# Patient Record
Sex: Female | Born: 1950
Health system: Southern US, Community
[De-identification: ages and names within clinical notes are randomized; demographics above are authoritative.]

## PROBLEM LIST (undated history)

## (undated) DIAGNOSIS — I2510 Atherosclerotic heart disease of native coronary artery without angina pectoris: Secondary | ICD-10-CM

## (undated) DIAGNOSIS — M797 Fibromyalgia: Secondary | ICD-10-CM

## (undated) DIAGNOSIS — J189 Pneumonia, unspecified organism: Secondary | ICD-10-CM

## (undated) DIAGNOSIS — F329 Major depressive disorder, single episode, unspecified: Secondary | ICD-10-CM

## (undated) DIAGNOSIS — M1990 Unspecified osteoarthritis, unspecified site: Secondary | ICD-10-CM

## (undated) DIAGNOSIS — I10 Essential (primary) hypertension: Secondary | ICD-10-CM

## (undated) DIAGNOSIS — F32A Depression, unspecified: Secondary | ICD-10-CM

## (undated) DIAGNOSIS — E049 Nontoxic goiter, unspecified: Secondary | ICD-10-CM

## (undated) DIAGNOSIS — I219 Acute myocardial infarction, unspecified: Secondary | ICD-10-CM

## (undated) HISTORY — PX: REDUCTION MAMMAPLASTY: SUR839

## (undated) HISTORY — PX: BREAST SURGERY: SHX581

## (undated) HISTORY — PX: CARDIAC CATHETERIZATION: SHX172

## (undated) HISTORY — PX: ABDOMINAL HYSTERECTOMY: SHX81

---

## 2000-12-27 ENCOUNTER — Ambulatory Visit (HOSPITAL_COMMUNITY): Admission: RE | Admit: 2000-12-27 | Discharge: 2000-12-27 | Payer: Self-pay | Admitting: Family Medicine

## 2003-02-18 ENCOUNTER — Emergency Department (HOSPITAL_COMMUNITY): Admission: EM | Admit: 2003-02-18 | Discharge: 2003-02-18 | Payer: Self-pay | Admitting: Emergency Medicine

## 2003-06-22 ENCOUNTER — Ambulatory Visit (HOSPITAL_COMMUNITY): Admission: RE | Admit: 2003-06-22 | Discharge: 2003-06-22 | Payer: Self-pay | Admitting: Family Medicine

## 2003-10-19 ENCOUNTER — Ambulatory Visit: Payer: Self-pay | Admitting: Family Medicine

## 2003-11-17 ENCOUNTER — Ambulatory Visit: Payer: Self-pay | Admitting: *Deleted

## 2004-05-02 ENCOUNTER — Ambulatory Visit: Payer: Self-pay | Admitting: Family Medicine

## 2004-05-24 ENCOUNTER — Ambulatory Visit: Payer: Self-pay | Admitting: Family Medicine

## 2004-10-31 ENCOUNTER — Ambulatory Visit: Payer: Self-pay | Admitting: Family Medicine

## 2004-11-08 ENCOUNTER — Ambulatory Visit (HOSPITAL_COMMUNITY): Admission: RE | Admit: 2004-11-08 | Discharge: 2004-11-08 | Payer: Self-pay | Admitting: Family Medicine

## 2004-12-13 ENCOUNTER — Ambulatory Visit (HOSPITAL_COMMUNITY): Admission: RE | Admit: 2004-12-13 | Discharge: 2004-12-13 | Payer: Self-pay | Admitting: Family Medicine

## 2004-12-13 ENCOUNTER — Emergency Department (HOSPITAL_COMMUNITY): Admission: EM | Admit: 2004-12-13 | Discharge: 2004-12-13 | Payer: Self-pay | Admitting: Emergency Medicine

## 2005-01-10 ENCOUNTER — Ambulatory Visit: Payer: Self-pay | Admitting: Family Medicine

## 2005-01-26 ENCOUNTER — Ambulatory Visit: Payer: Self-pay | Admitting: Family Medicine

## 2005-06-13 ENCOUNTER — Emergency Department (HOSPITAL_COMMUNITY): Admission: EM | Admit: 2005-06-13 | Discharge: 2005-06-13 | Payer: Self-pay | Admitting: Emergency Medicine

## 2005-06-18 ENCOUNTER — Ambulatory Visit: Payer: Self-pay | Admitting: Family Medicine

## 2005-09-28 ENCOUNTER — Emergency Department (HOSPITAL_COMMUNITY): Admission: EM | Admit: 2005-09-28 | Discharge: 2005-09-29 | Payer: Self-pay | Admitting: Emergency Medicine

## 2005-10-09 ENCOUNTER — Ambulatory Visit: Payer: Self-pay | Admitting: Family Medicine

## 2005-12-31 ENCOUNTER — Ambulatory Visit: Payer: Self-pay | Admitting: Family Medicine

## 2006-04-23 ENCOUNTER — Ambulatory Visit: Payer: Self-pay | Admitting: Family Medicine

## 2006-10-02 ENCOUNTER — Ambulatory Visit: Payer: Self-pay | Admitting: Family Medicine

## 2006-10-23 ENCOUNTER — Encounter (INDEPENDENT_AMBULATORY_CARE_PROVIDER_SITE_OTHER): Payer: Self-pay | Admitting: *Deleted

## 2006-11-01 ENCOUNTER — Emergency Department (HOSPITAL_COMMUNITY): Admission: EM | Admit: 2006-11-01 | Discharge: 2006-11-01 | Payer: Self-pay | Admitting: Emergency Medicine

## 2006-11-05 ENCOUNTER — Ambulatory Visit: Payer: Self-pay | Admitting: Family Medicine

## 2006-11-05 LAB — CONVERTED CEMR LAB
ALT: 16 units/L (ref 0–35)
AST: 20 units/L (ref 0–37)
Albumin: 4.2 g/dL (ref 3.5–5.2)
Alkaline Phosphatase: 101 units/L (ref 39–117)
BUN: 19 mg/dL (ref 6–23)
Basophils Absolute: 0 10*3/uL (ref 0.0–0.1)
Basophils Relative: 0 % (ref 0–1)
CO2: 28 meq/L (ref 19–32)
Calcium: 9 mg/dL (ref 8.4–10.5)
Chloride: 104 meq/L (ref 96–112)
Creatinine, Ser: 0.91 mg/dL (ref 0.40–1.20)
Eosinophils Absolute: 0.2 10*3/uL (ref 0.0–0.7)
Eosinophils Relative: 2 % (ref 0–5)
Glucose, Bld: 92 mg/dL (ref 70–99)
HCT: 38.1 % (ref 36.0–46.0)
Hemoglobin: 12.1 g/dL (ref 12.0–15.0)
Lymphocytes Relative: 31 % (ref 12–46)
Lymphs Abs: 2.4 10*3/uL (ref 0.7–3.3)
MCHC: 31.8 g/dL (ref 30.0–36.0)
MCV: 82.8 fL (ref 78.0–100.0)
Monocytes Absolute: 0.7 10*3/uL (ref 0.2–0.7)
Monocytes Relative: 9 % (ref 3–11)
Neutro Abs: 4.4 10*3/uL (ref 1.7–7.7)
Neutrophils Relative %: 58 % (ref 43–77)
Platelets: 265 10*3/uL (ref 150–400)
Potassium: 3.7 meq/L (ref 3.5–5.3)
RBC: 4.6 M/uL (ref 3.87–5.11)
RDW: 16.2 % — ABNORMAL HIGH (ref 11.5–14.0)
Sodium: 144 meq/L (ref 135–145)
Total Bilirubin: 0.4 mg/dL (ref 0.3–1.2)
Total Protein: 6.7 g/dL (ref 6.0–8.3)
WBC: 7.7 10*3/uL (ref 4.0–10.5)

## 2007-02-07 ENCOUNTER — Ambulatory Visit: Payer: Self-pay | Admitting: Family Medicine

## 2007-02-27 ENCOUNTER — Ambulatory Visit: Payer: Self-pay | Admitting: Family Medicine

## 2007-09-05 ENCOUNTER — Ambulatory Visit: Payer: Self-pay | Admitting: Family Medicine

## 2007-09-05 LAB — CONVERTED CEMR LAB
ALT: 20 units/L (ref 0–35)
AST: 26 units/L (ref 0–37)
Albumin: 4.6 g/dL (ref 3.5–5.2)
Alkaline Phosphatase: 108 units/L (ref 39–117)
BUN: 15 mg/dL (ref 6–23)
CO2: 25 meq/L (ref 19–32)
Calcium: 9.3 mg/dL (ref 8.4–10.5)
Chloride: 101 meq/L (ref 96–112)
Cholesterol: 186 mg/dL (ref 0–200)
Creatinine, Ser: 1.07 mg/dL (ref 0.40–1.20)
Glucose, Bld: 133 mg/dL — ABNORMAL HIGH (ref 70–99)
HDL: 57 mg/dL (ref >39)
LDL Cholesterol: 113 mg/dL — ABNORMAL HIGH (ref 0–99)
Potassium: 3.5 meq/L (ref 3.5–5.3)
Sodium: 139 meq/L (ref 135–145)
TSH: 1.842 microintl units/mL (ref 0.350–4.50)
Total Bilirubin: 0.4 mg/dL (ref 0.3–1.2)
Total CHOL/HDL Ratio: 3.3
Total Protein: 7.4 g/dL (ref 6.0–8.3)
Triglycerides: 82 mg/dL (ref <150)
VLDL: 16 mg/dL (ref 0–40)

## 2007-09-11 ENCOUNTER — Ambulatory Visit (HOSPITAL_COMMUNITY): Admission: RE | Admit: 2007-09-11 | Discharge: 2007-09-11 | Payer: Self-pay | Admitting: Family Medicine

## 2008-03-25 ENCOUNTER — Ambulatory Visit: Payer: Self-pay | Admitting: Family Medicine

## 2008-03-30 ENCOUNTER — Emergency Department (HOSPITAL_COMMUNITY): Admission: EM | Admit: 2008-03-30 | Discharge: 2008-03-30 | Payer: Self-pay | Admitting: Emergency Medicine

## 2008-05-24 ENCOUNTER — Ambulatory Visit: Payer: Self-pay | Admitting: Family Medicine

## 2008-05-25 ENCOUNTER — Encounter (INDEPENDENT_AMBULATORY_CARE_PROVIDER_SITE_OTHER): Payer: Self-pay | Admitting: Family Medicine

## 2008-05-25 LAB — CONVERTED CEMR LAB
BUN: 12 mg/dL (ref 6–23)
Basophils Absolute: 0 10*3/uL (ref 0.0–0.1)
Basophils Relative: 0 % (ref 0–1)
CO2: 27 meq/L (ref 19–32)
Calcium: 8.9 mg/dL (ref 8.4–10.5)
Chloride: 102 meq/L (ref 96–112)
Creatinine, Ser: 0.91 mg/dL (ref 0.40–1.20)
Eosinophils Absolute: 0.2 10*3/uL (ref 0.0–0.7)
Eosinophils Relative: 3 % (ref 0–5)
Glucose, Bld: 169 mg/dL — ABNORMAL HIGH (ref 70–99)
HCT: 36.1 % (ref 36.0–46.0)
Hemoglobin: 11.4 g/dL — ABNORMAL LOW (ref 12.0–15.0)
Lymphocytes Relative: 33 % (ref 12–46)
Lymphs Abs: 2 10*3/uL (ref 0.7–4.0)
MCHC: 31.6 g/dL (ref 30.0–36.0)
MCV: 84.1 fL (ref 78.0–100.0)
Monocytes Absolute: 0.5 10*3/uL (ref 0.1–1.0)
Monocytes Relative: 8 % (ref 3–12)
Neutro Abs: 3.6 10*3/uL (ref 1.7–7.7)
Neutrophils Relative %: 57 % (ref 43–77)
Platelets: 273 10*3/uL (ref 150–400)
Potassium: 3.4 meq/L — ABNORMAL LOW (ref 3.5–5.3)
RBC: 4.29 M/uL (ref 3.87–5.11)
RDW: 16.7 % — ABNORMAL HIGH (ref 11.5–15.5)
Sodium: 143 meq/L (ref 135–145)
WBC: 6.3 10*3/uL (ref 4.0–10.5)

## 2008-06-10 ENCOUNTER — Ambulatory Visit: Payer: Self-pay | Admitting: Family Medicine

## 2008-09-02 ENCOUNTER — Ambulatory Visit: Payer: Self-pay | Admitting: Family Medicine

## 2009-03-01 ENCOUNTER — Ambulatory Visit: Payer: Self-pay | Admitting: Family Medicine

## 2009-03-01 LAB — CONVERTED CEMR LAB
BUN: 15 mg/dL (ref 6–23)
CO2: 29 meq/L (ref 19–32)
Calcium: 9.6 mg/dL (ref 8.4–10.5)
Chloride: 104 meq/L (ref 96–112)
Creatinine, Ser: 1.01 mg/dL (ref 0.40–1.20)
Glucose, Bld: 108 mg/dL — ABNORMAL HIGH (ref 70–99)
Potassium: 3.6 meq/L (ref 3.5–5.3)
Sodium: 141 meq/L (ref 135–145)
TSH: 1.594 microintl units/mL (ref 0.350–4.500)
Vit D, 25-Hydroxy: 30 ng/mL (ref 30–89)

## 2009-03-14 ENCOUNTER — Emergency Department (HOSPITAL_COMMUNITY): Admission: EM | Admit: 2009-03-14 | Discharge: 2009-03-14 | Payer: Self-pay | Admitting: Emergency Medicine

## 2009-03-21 ENCOUNTER — Ambulatory Visit: Payer: Self-pay | Admitting: Family Medicine

## 2009-03-21 LAB — CONVERTED CEMR LAB
Cholesterol: 122 mg/dL (ref 0–200)
HDL: 44 mg/dL (ref >39)
LDL Cholesterol: 63 mg/dL (ref 0–99)
Total CHOL/HDL Ratio: 2.8
Triglycerides: 77 mg/dL (ref <150)
VLDL: 15 mg/dL (ref 0–40)

## 2009-04-14 ENCOUNTER — Ambulatory Visit: Payer: Self-pay | Admitting: Family Medicine

## 2009-05-30 ENCOUNTER — Ambulatory Visit: Payer: Self-pay | Admitting: Family Medicine

## 2009-06-06 ENCOUNTER — Ambulatory Visit: Payer: Self-pay | Admitting: Internal Medicine

## 2009-07-10 ENCOUNTER — Emergency Department (HOSPITAL_COMMUNITY): Admission: EM | Admit: 2009-07-10 | Discharge: 2009-07-10 | Payer: Self-pay | Admitting: Emergency Medicine

## 2009-07-14 ENCOUNTER — Ambulatory Visit: Payer: Self-pay | Admitting: Family Medicine

## 2009-08-03 ENCOUNTER — Ambulatory Visit: Payer: Self-pay | Admitting: Family Medicine

## 2009-08-03 LAB — CONVERTED CEMR LAB
BUN: 15 mg/dL (ref 6–23)
CO2: 31 meq/L (ref 19–32)
Calcium: 9.9 mg/dL (ref 8.4–10.5)
Chloride: 102 meq/L (ref 96–112)
Creatinine, Ser: 1.09 mg/dL (ref 0.40–1.20)
Glucose, Bld: 126 mg/dL — ABNORMAL HIGH (ref 70–99)
Potassium: 3.9 meq/L (ref 3.5–5.3)
Sed Rate: 13 mm/hr (ref 0–22)
Sodium: 144 meq/L (ref 135–145)

## 2010-03-07 NOTE — Miscellaneous (Signed)
Summary: VIP  Patient: Jerusalem Nemes Note: All result statuses are Final unless otherwise noted.  Tests: (1) VIP (Medications)   LLIMPORTMEDS              "Result Below..."       RESULT: WELLBUTRIN XL TB24 300 MG*TAKE ONE (1) TABLET BY MOUTH EVERY DAY  GENE ICP WELLBUTRN XL 300 0808*10/02/2006*Last Refill: 10/28/2006*[id number]*******   LLIMPORTMEDS              "Result Below..."       RESULT: VESICARE TABS 5 MG*TAKE ONE (1) TABLET EACH DAY*10/02/2006*Last Refill: 10/28/2006*[id number]*******   LLIMPORTMEDS              "Result Below..."       RESULT: POTASSIUM CHLORIDE CRYS CR TBCR 20 MEQ*TAKE ONE (1) TABLET EACH  DAY*10/02/2006*Last Refill: 10/28/2006*[id number]*******   LLIMPORTMEDS              "Result Below..."       RESULT: NORVASC TABS 10 MG*TAKE ONE (1) TABLET EACH DAY*10/02/2006*Last Refill: 10/28/2006*[id number]*******   LLIMPORTMEDS              "Result Below..."       RESULT: NASACORT AQ AERS 55 MCG/ACT*SPRAY 2 PUFFS IN EACH NOSTRIL DAILY AT BEDTIME  GENE HAS ICP NASACORT AQ 07/08*10/02/2006*Last Refill: 10/28/2006*[id number]*******   LLIMPORTMEDS              "Result Below..."       RESULT: HYDROCHLOROTHIAZIDE TABS 25 MG*TAKE ONE (1) TABLET EACH DAY*10/02/2006*Last Refill: 10/28/2006*[id number]*******   LLIMPORTMEDS              "Result Below..."       RESULT: EFFEXOR XR CP24 75 MG*TAKE ONE (1) CAPSULE TWICE DAILY*10/02/2006*Last Refill: 10/28/2006*[id number]*******   LLIMPORTMEDS              "Result Below..."       RESULT: ALLEGRA TABS 180 MG*TAKE ONE TABLET EACH AFTERNOON AS DIRECTED   GENE ICP ALLEGRA 180MG  07/08*10/28/2006*Last Refill: [id number]*******   LLIMPORTALLS              "Result Below..."       RESULT: NIFEDIPINE ORAL (PROCARDIA)*[id number]**  Note: An exclamation mark (!) indicates a result that was not dispersed into the flowsheet. Document Creation Date: 12/05/2006 3:00 PM _______________________________________________________________________  (1) Order result status: Final Collection or  observation date-time: 10/23/2006 Requested date-time: 10/23/2006 Receipt date-time:  Reported date-time: 10/23/2006 Referring Physician:   Ordering Physician:   Specimen Source:  Source: [place] Order Number:  Lab site:

## 2010-04-24 LAB — POCT CARDIAC MARKERS
CKMB, poc: 2.3 ng/mL (ref 1.0–8.0)
Myoglobin, poc: 159 ng/mL (ref 12–200)
Troponin i, poc: <0.05 ng/mL (ref 0.00–0.09)

## 2010-04-24 LAB — POCT I-STAT, CHEM 8
BUN: 13 mg/dL (ref 6–23)
Calcium, Ion: 1.15 mmol/L (ref 1.12–1.32)
Chloride: 100 mEq/L (ref 96–112)
Creatinine, Ser: 1.1 mg/dL (ref 0.4–1.2)
Glucose, Bld: 113 mg/dL — ABNORMAL HIGH (ref 70–99)
HCT: 41 % (ref 36.0–46.0)
Hemoglobin: 13.9 g/dL (ref 12.0–15.0)
Potassium: 3 mEq/L — ABNORMAL LOW (ref 3.5–5.1)
Sodium: 139 mEq/L (ref 135–145)
TCO2: 31 mmol/L (ref 0–100)

## 2010-04-24 LAB — URINALYSIS, ROUTINE W REFLEX MICROSCOPIC
Bilirubin Urine: NEGATIVE
Glucose, UA: NEGATIVE mg/dL
Hgb urine dipstick: NEGATIVE
Ketones, ur: NEGATIVE mg/dL
Nitrite: NEGATIVE
Protein, ur: NEGATIVE mg/dL
Specific Gravity, Urine: 1.01 (ref 1.005–1.030)
Urobilinogen, UA: 1 mg/dL (ref 0.0–1.0)
pH: 7.5 (ref 5.0–8.0)

## 2010-04-26 LAB — DIFFERENTIAL
Basophils Absolute: 0 10*3/uL (ref 0.0–0.1)
Basophils Relative: 1 % (ref 0–1)
Eosinophils Absolute: 0.2 10*3/uL (ref 0.0–0.7)
Eosinophils Relative: 2 % (ref 0–5)
Lymphocytes Relative: 35 % (ref 12–46)
Lymphs Abs: 2.7 10*3/uL (ref 0.7–4.0)
Monocytes Absolute: 0.6 10*3/uL (ref 0.1–1.0)
Monocytes Relative: 8 % (ref 3–12)
Neutro Abs: 4.1 10*3/uL (ref 1.7–7.7)
Neutrophils Relative %: 54 % (ref 43–77)

## 2010-04-26 LAB — CBC
HCT: 36.7 % (ref 36.0–46.0)
Hemoglobin: 12.6 g/dL (ref 12.0–15.0)
MCHC: 34.4 g/dL (ref 30.0–36.0)
MCV: 83.8 fL (ref 78.0–100.0)
Platelets: 260 10*3/uL (ref 150–400)
RBC: 4.37 MIL/uL (ref 3.87–5.11)
RDW: 14.1 % (ref 11.5–15.5)
WBC: 7.6 10*3/uL (ref 4.0–10.5)

## 2010-04-26 LAB — POCT CARDIAC MARKERS
CKMB, poc: 1.1 ng/mL (ref 1.0–8.0)
CKMB, poc: 1.7 ng/mL (ref 1.0–8.0)
Myoglobin, poc: 107 ng/mL (ref 12–200)
Myoglobin, poc: 134 ng/mL (ref 12–200)
Troponin i, poc: <0.05 ng/mL (ref 0.00–0.09)
Troponin i, poc: <0.05 ng/mL (ref 0.00–0.09)

## 2010-04-26 LAB — BASIC METABOLIC PANEL
BUN: 15 mg/dL (ref 6–23)
CO2: 32 mEq/L (ref 19–32)
Calcium: 9.4 mg/dL (ref 8.4–10.5)
Chloride: 98 mEq/L (ref 96–112)
Creatinine, Ser: 0.97 mg/dL (ref 0.4–1.2)
GFR calc Af Amer: >60 mL/min (ref >60)
GFR calc non Af Amer: 59 mL/min — ABNORMAL LOW (ref >60)
Glucose, Bld: 97 mg/dL (ref 70–99)
Potassium: 3.8 mEq/L (ref 3.5–5.1)
Sodium: 136 mEq/L (ref 135–145)

## 2010-04-26 LAB — PROTIME-INR
INR: 0.94 (ref 0.00–1.49)
Prothrombin Time: 12.5 seconds (ref 11.6–15.2)

## 2010-04-26 LAB — APTT: aPTT: 33 seconds (ref 24–37)

## 2010-05-23 LAB — DIFFERENTIAL
Basophils Absolute: 0 10*3/uL (ref 0.0–0.1)
Basophils Relative: 1 % (ref 0–1)
Eosinophils Absolute: 0.1 10*3/uL (ref 0.0–0.7)
Eosinophils Relative: 2 % (ref 0–5)
Lymphocytes Relative: 32 % (ref 12–46)
Lymphs Abs: 2.2 10*3/uL (ref 0.7–4.0)
Monocytes Absolute: 0.5 10*3/uL (ref 0.1–1.0)
Monocytes Relative: 7 % (ref 3–12)
Neutro Abs: 4.1 10*3/uL (ref 1.7–7.7)
Neutrophils Relative %: 58 % (ref 43–77)

## 2010-05-23 LAB — BASIC METABOLIC PANEL
BUN: 13 mg/dL (ref 6–23)
CO2: 29 mEq/L (ref 19–32)
Calcium: 9.1 mg/dL (ref 8.4–10.5)
Chloride: 101 mEq/L (ref 96–112)
Creatinine, Ser: 0.85 mg/dL (ref 0.4–1.2)
GFR calc Af Amer: >60 mL/min (ref >60)
GFR calc non Af Amer: >60 mL/min (ref >60)
Glucose, Bld: 115 mg/dL — ABNORMAL HIGH (ref 70–99)
Potassium: 3.4 mEq/L — ABNORMAL LOW (ref 3.5–5.1)
Sodium: 138 mEq/L (ref 135–145)

## 2010-05-23 LAB — CBC
HCT: 37.5 % (ref 36.0–46.0)
Hemoglobin: 12.3 g/dL (ref 12.0–15.0)
MCHC: 32.9 g/dL (ref 30.0–36.0)
MCV: 82.6 fL (ref 78.0–100.0)
Platelets: 241 10*3/uL (ref 150–400)
RBC: 4.54 MIL/uL (ref 3.87–5.11)
RDW: 15 % (ref 11.5–15.5)
WBC: 7 10*3/uL (ref 4.0–10.5)

## 2010-05-23 LAB — D-DIMER, QUANTITATIVE: D-Dimer, Quant: 0.22 ug/mL-FEU (ref 0.00–0.48)

## 2010-08-31 ENCOUNTER — Other Ambulatory Visit: Payer: Self-pay | Admitting: Internal Medicine

## 2010-08-31 DIAGNOSIS — Z1231 Encounter for screening mammogram for malignant neoplasm of breast: Secondary | ICD-10-CM

## 2010-09-08 ENCOUNTER — Ambulatory Visit (HOSPITAL_COMMUNITY)
Admission: RE | Admit: 2010-09-08 | Discharge: 2010-09-08 | Disposition: A | Discharge status: Home / Self Care | Payer: Self-pay | Source: Ambulatory Visit | Attending: Internal Medicine | Admitting: Internal Medicine

## 2010-09-08 DIAGNOSIS — Z1231 Encounter for screening mammogram for malignant neoplasm of breast: Secondary | ICD-10-CM | POA: Insufficient documentation

## 2011-02-27 ENCOUNTER — Emergency Department (HOSPITAL_COMMUNITY)
Admission: EM | Admit: 2011-02-27 | Discharge: 2011-02-27 | Discharge status: Against Medical Advice | Payer: Self-pay | Attending: Emergency Medicine | Admitting: Emergency Medicine

## 2011-02-27 ENCOUNTER — Emergency Department (HOSPITAL_COMMUNITY): Payer: Self-pay

## 2011-02-27 ENCOUNTER — Encounter (HOSPITAL_COMMUNITY): Payer: Self-pay | Admitting: Emergency Medicine

## 2011-02-27 DIAGNOSIS — I2510 Atherosclerotic heart disease of native coronary artery without angina pectoris: Secondary | ICD-10-CM | POA: Insufficient documentation

## 2011-02-27 DIAGNOSIS — F3289 Other specified depressive episodes: Secondary | ICD-10-CM | POA: Insufficient documentation

## 2011-02-27 DIAGNOSIS — R05 Cough: Secondary | ICD-10-CM | POA: Insufficient documentation

## 2011-02-27 DIAGNOSIS — Z79899 Other long term (current) drug therapy: Secondary | ICD-10-CM | POA: Insufficient documentation

## 2011-02-27 DIAGNOSIS — I10 Essential (primary) hypertension: Secondary | ICD-10-CM | POA: Insufficient documentation

## 2011-02-27 DIAGNOSIS — R059 Cough, unspecified: Secondary | ICD-10-CM | POA: Insufficient documentation

## 2011-02-27 DIAGNOSIS — F329 Major depressive disorder, single episode, unspecified: Secondary | ICD-10-CM | POA: Insufficient documentation

## 2011-02-27 DIAGNOSIS — E119 Type 2 diabetes mellitus without complications: Secondary | ICD-10-CM | POA: Insufficient documentation

## 2011-02-27 HISTORY — DX: Depression, unspecified: F32.A

## 2011-02-27 HISTORY — DX: Major depressive disorder, single episode, unspecified: F32.9

## 2011-02-27 HISTORY — DX: Essential (primary) hypertension: I10

## 2011-02-27 HISTORY — DX: Atherosclerotic heart disease of native coronary artery without angina pectoris: I25.10

## 2011-02-27 NOTE — ED Notes (Signed)
Cough X 2 weeks

## 2011-02-27 NOTE — ED Notes (Deleted)
Unable to locate pt in room in the ED  Benny Lennert, MD 03/04/11 1551

## 2011-02-27 NOTE — ED Notes (Signed)
Has run out of bp meds x 1 month

## 2011-03-01 NOTE — ED Provider Notes (Signed)
History     CSN: [id number]  Arrival date & time 02/27/11  1341   First MD Initiated Contact with Patient 02/27/11 1730      Chief Complaint  Patient presents with  . Cough    (Consider location/radiation/quality/duration/timing/severity/associated sxs/prior treatment) HPI  Past Medical History  Diagnosis Date  . Diabetes mellitus   . Hypertension   . Coronary artery disease   . Depression     Past Surgical History  Procedure Date  . Abdominal hysterectomy   . Breast surgery     No family history on file.  History  Substance Use Topics  . Smoking status: Current Everyday Smoker  . Smokeless tobacco: Not on file  . Alcohol Use: Yes    OB History    Grav Para Term Preterm Abortions TAB SAB Ect Mult Living                  Review of Systems  Allergies  Procardia  Home Medications   Current Outpatient Rx  Name Route Sig Dispense Refill  . AMLODIPINE BESYLATE 10 MG PO TABS Oral Take 10 mg by mouth daily.    . ATENOLOL-CHLORTHALIDONE 50-25 MG PO TABS Oral Take 1 tablet by mouth daily.    . BUPROPION HCL ER (SR) 150 MG PO TB12 Oral Take 150 mg by mouth 2 (two) times daily.    Marland Kitchen METFORMIN HCL 500 MG PO TABS Oral Take 500 mg by mouth daily with breakfast.    . POTASSIUM CHLORIDE CRYS ER 10 MEQ PO TBCR Oral Take 10 mEq by mouth daily.    . VENLAFAXINE HCL ER 75 MG PO CP24 Oral Take 75 mg by mouth 2 (two) times daily.      BP 124/95  Pulse 85  Temp(Src) 97.5 F (36.4 C) (Oral)  Resp 16  SpO2 98%  Physical Exam  ED Course  Procedures (including critical care time)  Labs Reviewed - No data to display No results found.   No diagnosis found.    MDM  Pt not seen        Benny Lennert, MD 03/04/11 [phone]

## 2011-03-04 NOTE — Progress Notes (Signed)
Pt not seen .  Pt left ama

## 2011-03-06 ENCOUNTER — Emergency Department (HOSPITAL_COMMUNITY)
Admission: EM | Admit: 2011-03-06 | Discharge: 2011-03-07 | Discharge status: Against Medical Advice | Payer: Self-pay | Attending: Emergency Medicine | Admitting: Emergency Medicine

## 2011-03-06 ENCOUNTER — Encounter (HOSPITAL_COMMUNITY): Payer: Self-pay | Admitting: Emergency Medicine

## 2011-03-06 DIAGNOSIS — W108XXA Fall (on) (from) other stairs and steps, initial encounter: Secondary | ICD-10-CM | POA: Insufficient documentation

## 2011-03-06 DIAGNOSIS — R209 Unspecified disturbances of skin sensation: Secondary | ICD-10-CM | POA: Insufficient documentation

## 2011-03-06 DIAGNOSIS — M549 Dorsalgia, unspecified: Secondary | ICD-10-CM | POA: Insufficient documentation

## 2011-03-06 DIAGNOSIS — M25569 Pain in unspecified knee: Secondary | ICD-10-CM | POA: Insufficient documentation

## 2011-03-06 DIAGNOSIS — R51 Headache: Secondary | ICD-10-CM | POA: Insufficient documentation

## 2011-03-06 DIAGNOSIS — R3989 Other symptoms and signs involving the genitourinary system: Secondary | ICD-10-CM | POA: Insufficient documentation

## 2011-03-06 LAB — GLUCOSE, CAPILLARY: Glucose-Capillary: 153 mg/dL — ABNORMAL HIGH (ref 70–99)

## 2011-03-06 NOTE — ED Notes (Signed)
Pt states she was walking up the steps and and she fell up the steps hitting her knees on the concrete and then fell back and hit her head on the concrete as well  Pt states she has had tingling in her hands and tingling in her feet and pain in her knees  Pt states she has a headache and her upper back hurts  Pt denies LOC but it took her a while to be able to get up   Pt states this happened yesterday evening  Pt states the pain in her head is in her temple areas

## 2011-03-06 NOTE — ED Notes (Signed)
Pt states her urine has a funny odor to it and would like it checked while she is here

## 2011-06-03 ENCOUNTER — Encounter (HOSPITAL_COMMUNITY): Payer: Self-pay | Admitting: *Deleted

## 2011-06-03 ENCOUNTER — Emergency Department (HOSPITAL_COMMUNITY)
Admission: EM | Admit: 2011-06-03 | Discharge: 2011-06-03 | Disposition: A | Discharge status: Home / Self Care | Payer: Self-pay | Attending: Emergency Medicine | Admitting: Emergency Medicine

## 2011-06-03 DIAGNOSIS — E119 Type 2 diabetes mellitus without complications: Secondary | ICD-10-CM | POA: Insufficient documentation

## 2011-06-03 DIAGNOSIS — F3289 Other specified depressive episodes: Secondary | ICD-10-CM | POA: Insufficient documentation

## 2011-06-03 DIAGNOSIS — IMO0001 Reserved for inherently not codable concepts without codable children: Secondary | ICD-10-CM | POA: Insufficient documentation

## 2011-06-03 DIAGNOSIS — I252 Old myocardial infarction: Secondary | ICD-10-CM | POA: Insufficient documentation

## 2011-06-03 DIAGNOSIS — F329 Major depressive disorder, single episode, unspecified: Secondary | ICD-10-CM | POA: Insufficient documentation

## 2011-06-03 DIAGNOSIS — Z79899 Other long term (current) drug therapy: Secondary | ICD-10-CM | POA: Insufficient documentation

## 2011-06-03 DIAGNOSIS — I10 Essential (primary) hypertension: Secondary | ICD-10-CM | POA: Insufficient documentation

## 2011-06-03 DIAGNOSIS — F17200 Nicotine dependence, unspecified, uncomplicated: Secondary | ICD-10-CM | POA: Insufficient documentation

## 2011-06-03 DIAGNOSIS — Z760 Encounter for issue of repeat prescription: Secondary | ICD-10-CM | POA: Insufficient documentation

## 2011-06-03 DIAGNOSIS — R519 Headache, unspecified: Secondary | ICD-10-CM

## 2011-06-03 DIAGNOSIS — M79609 Pain in unspecified limb: Secondary | ICD-10-CM | POA: Insufficient documentation

## 2011-06-03 DIAGNOSIS — R4182 Altered mental status, unspecified: Secondary | ICD-10-CM | POA: Insufficient documentation

## 2011-06-03 DIAGNOSIS — I2510 Atherosclerotic heart disease of native coronary artery without angina pectoris: Secondary | ICD-10-CM | POA: Insufficient documentation

## 2011-06-03 DIAGNOSIS — R51 Headache: Secondary | ICD-10-CM | POA: Insufficient documentation

## 2011-06-03 HISTORY — DX: Acute myocardial infarction, unspecified: I21.9

## 2011-06-03 HISTORY — DX: Fibromyalgia: M79.7

## 2011-06-03 LAB — BASIC METABOLIC PANEL
BUN: 14 mg/dL (ref 6–23)
CO2: 31 mEq/L (ref 19–32)
Calcium: 9.3 mg/dL (ref 8.4–10.5)
Chloride: 102 mEq/L (ref 96–112)
Creatinine, Ser: 0.98 mg/dL (ref 0.50–1.10)
GFR calc Af Amer: 70 mL/min — ABNORMAL LOW (ref >90)
GFR calc non Af Amer: 61 mL/min — ABNORMAL LOW (ref >90)
Glucose, Bld: 90 mg/dL (ref 70–99)
Potassium: 3 mEq/L — ABNORMAL LOW (ref 3.5–5.1)
Sodium: 141 mEq/L (ref 135–145)

## 2011-06-03 LAB — CBC
HCT: 38.8 % (ref 36.0–46.0)
Hemoglobin: 12.6 g/dL (ref 12.0–15.0)
MCH: 27 pg (ref 26.0–34.0)
MCHC: 32.5 g/dL (ref 30.0–36.0)
MCV: 83.1 fL (ref 78.0–100.0)
Platelets: 278 10*3/uL (ref 150–400)
RBC: 4.67 MIL/uL (ref 3.87–5.11)
RDW: 14.8 % (ref 11.5–15.5)
WBC: 9 10*3/uL (ref 4.0–10.5)

## 2011-06-03 LAB — GLUCOSE, CAPILLARY: Glucose-Capillary: 83 mg/dL (ref 70–99)

## 2011-06-03 LAB — TROPONIN I: Troponin I: <0.3 ng/mL (ref <0.30)

## 2011-06-03 MED ORDER — BUPROPION HCL ER (SR) 150 MG PO TB12: 150.0000 mg | ORAL_TABLET | Freq: Two times a day (BID) | ORAL | Status: DC

## 2011-06-03 MED ORDER — AMLODIPINE BESYLATE 10 MG PO TABS: 10.0000 mg | ORAL_TABLET | Freq: Every day | ORAL | Status: DC

## 2011-06-03 MED ORDER — VENLAFAXINE HCL ER 75 MG PO CP24: 75.0000 mg | ORAL_CAPSULE | Freq: Two times a day (BID) | ORAL | Status: DC

## 2011-06-03 MED ORDER — ATENOLOL-CHLORTHALIDONE 50-25 MG PO TABS: 1.0000 | ORAL_TABLET | Freq: Every day | ORAL | Status: DC

## 2011-06-03 MED ORDER — METFORMIN HCL 500 MG PO TABS: 500.0000 mg | ORAL_TABLET | Freq: Two times a day (BID) | ORAL | Status: DC

## 2011-06-03 MED ORDER — POTASSIUM CHLORIDE CRYS ER 10 MEQ PO TBCR: 10.0000 meq | EXTENDED_RELEASE_TABLET | Freq: Two times a day (BID) | ORAL | Status: DC

## 2011-06-03 MED ORDER — OXYCODONE-ACETAMINOPHEN 5-325 MG PO TABS
2.0000 | ORAL_TABLET | Freq: Once | ORAL | Status: AC
  Administered 2011-06-03: 2 via ORAL
  Filled 2011-06-03: qty 2

## 2011-06-03 MED ORDER — POTASSIUM CHLORIDE CRYS ER 20 MEQ PO TBCR
40.0000 meq | EXTENDED_RELEASE_TABLET | Freq: Once | ORAL | Status: AC
  Administered 2011-06-03: 40 meq via ORAL
  Filled 2011-06-03: qty 2

## 2011-06-03 NOTE — ED Provider Notes (Signed)
History     CSN: [id number]  Arrival date & time 06/03/11  1803   First MD Initiated Contact with Patient 06/03/11 1917      Chief Complaint  Patient presents with  . Altered Mental Status  . Shaking    facial  . Facial Pain    left  . Arm Pain    left/bicep area    HPI Pt awoke with left face twitching with occasional radiation of her pain towards her jaw. No CP or SOB. Hx of MI in past. Reports this is different. No fevers or chills. Reports "twitching" has resolved. No fevers or chills. No dental pain. No pcp, requests medication refills.  No weakness of her upper lower extremities.  No numbness.  Nothing worsens her symptoms.  Nothing improves her symptoms her symptoms were constant and now resolved.   Past Medical History  Diagnosis Date  . Diabetes mellitus   . Hypertension   . Coronary artery disease   . Depression   . Fibromyalgia   . MI (myocardial infarction)     Past Surgical History  Procedure Date  . Abdominal hysterectomy   . Breast surgery   . Cardiac catheterization     1995    Family History  Problem Relation Age of Onset  . Hypertension Mother   . Diabetes Mother   . Stroke Mother   . Hypertension Other   . Diabetes Other     History  Substance Use Topics  . Smoking status: Current Everyday Smoker -- 0.2 packs/day    Types: Cigarettes  . Smokeless tobacco: Never Used  . Alcohol Use: No    OB History    Grav Para Term Preterm Abortions TAB SAB Ect Mult Living                  Review of Systems  All other systems reviewed and are negative.    Allergies  Procardia  Home Medications   Current Outpatient Rx  Name Route Sig Dispense Refill  . AMLODIPINE BESYLATE 10 MG PO TABS Oral Take 1 tablet (10 mg total) by mouth daily. 30 tablet 1  . ATENOLOL-CHLORTHALIDONE 50-25 MG PO TABS Oral Take 1 tablet by mouth daily. 30 tablet 1  . BUPROPION HCL ER (SR) 150 MG PO TB12 Oral Take 1 tablet (150 mg total) by mouth 2 (two) times daily.  60 tablet 1  . METFORMIN HCL 500 MG PO TABS Oral Take 1 tablet (500 mg total) by mouth 2 (two) times daily with a meal. 60 tablet 1  . POTASSIUM CHLORIDE CRYS ER 10 MEQ PO TBCR Oral Take 1 tablet (10 mEq total) by mouth 2 (two) times daily. 60 tablet 1  . VENLAFAXINE HCL ER 75 MG PO CP24 Oral Take 1 capsule (75 mg total) by mouth 2 times daily at 12 noon and 4 pm. 60 capsule 1    BP 130/82  Pulse 79  Temp(Src) 98.6 F (37 C) (Oral)  Resp 17  SpO2 100%  Physical Exam  Nursing note and vitals reviewed. Constitutional: She is oriented to person, place, and time. She appears well-developed and well-nourished. No distress.  HENT:  Head: Normocephalic and atraumatic.       No dental tenderness.  No trismus or malocclusion.  Tolerating secretions.  No facial swelling noted.  No lymphadenopathy noted.  Extraocular movements are intact  Eyes: EOM are normal.  Neck: Normal range of motion.  Cardiovascular: Normal rate, regular rhythm and normal heart sounds.  Pulmonary/Chest: Effort normal and breath sounds normal.  Abdominal: Soft. She exhibits no distension. There is no tenderness.  Musculoskeletal: Normal range of motion.  Neurological: She is alert and oriented to person, place, and time.  Skin: Skin is warm and dry.  Psychiatric: She has a normal mood and affect. Judgment normal.    ED Course  Procedures (including critical care time)   Date: 06/05/2011  Rate: 81  Rhythm: normal sinus rhythm  QRS Axis: normal  Intervals: normal  ST/T Wave abnormalities: normal  Conduction Disutrbances: none  Narrative Interpretation:   Old EKG Reviewed: No significant changes noted     Labs Reviewed  BASIC METABOLIC PANEL - Abnormal; Notable for the following:    Potassium 3.0 (*)    GFR calc non Af Amer 61 (*)    GFR calc Af Amer 70 (*)    All other components within normal limits  GLUCOSE, CAPILLARY  CBC  TROPONIN I  LAB REPORT - SCANNED   No results found.   1. Face pain     2. Medication refill       MDM  The patient had transient left-sided facial and jaw pain.  My suspicion that this is ACS is very low.  Her EKG was without significant abnormality.  Her symptoms have resolved.  That may represent a muscle spasm.  She's had no recent facial trauma.  DC home in good condition.  Her potassium was repleted in the emergency department        Lyanne Co, MD 06/05/11 [phone]

## 2011-06-03 NOTE — ED Notes (Signed)
Pt from home with reports of being awakened between 0400 and 0500 this morning with facial shaking followed by facial pain on left side, pt reports checking CBG which was 78. Pt also reports being more confused than normal as well as left arm pain in the bicep area that started a couple hours ago.

## 2011-10-20 ENCOUNTER — Encounter (HOSPITAL_COMMUNITY): Payer: Self-pay | Admitting: Emergency Medicine

## 2011-10-20 ENCOUNTER — Emergency Department (HOSPITAL_COMMUNITY)
Admission: EM | Admit: 2011-10-20 | Discharge: 2011-10-21 | Disposition: A | Discharge status: Home / Self Care | Payer: Self-pay | Attending: Emergency Medicine | Admitting: Emergency Medicine

## 2011-10-20 DIAGNOSIS — I10 Essential (primary) hypertension: Secondary | ICD-10-CM | POA: Insufficient documentation

## 2011-10-20 DIAGNOSIS — M79609 Pain in unspecified limb: Secondary | ICD-10-CM | POA: Insufficient documentation

## 2011-10-20 DIAGNOSIS — I2510 Atherosclerotic heart disease of native coronary artery without angina pectoris: Secondary | ICD-10-CM | POA: Insufficient documentation

## 2011-10-20 DIAGNOSIS — M79669 Pain in unspecified lower leg: Secondary | ICD-10-CM

## 2011-10-20 DIAGNOSIS — IMO0001 Reserved for inherently not codable concepts without codable children: Secondary | ICD-10-CM | POA: Insufficient documentation

## 2011-10-20 DIAGNOSIS — E876 Hypokalemia: Secondary | ICD-10-CM | POA: Insufficient documentation

## 2011-10-20 DIAGNOSIS — E119 Type 2 diabetes mellitus without complications: Secondary | ICD-10-CM | POA: Insufficient documentation

## 2011-10-20 DIAGNOSIS — F17200 Nicotine dependence, unspecified, uncomplicated: Secondary | ICD-10-CM | POA: Insufficient documentation

## 2011-10-20 DIAGNOSIS — I252 Old myocardial infarction: Secondary | ICD-10-CM | POA: Insufficient documentation

## 2011-10-20 LAB — BASIC METABOLIC PANEL
BUN: 11 mg/dL (ref 6–23)
CO2: 34 mEq/L — ABNORMAL HIGH (ref 19–32)
Calcium: 9.5 mg/dL (ref 8.4–10.5)
Chloride: 99 mEq/L (ref 96–112)
Creatinine, Ser: 0.81 mg/dL (ref 0.50–1.10)
GFR calc Af Amer: 88 mL/min — ABNORMAL LOW (ref >90)
GFR calc non Af Amer: 76 mL/min — ABNORMAL LOW (ref >90)
Glucose, Bld: 102 mg/dL — ABNORMAL HIGH (ref 70–99)
Potassium: 3.1 mEq/L — ABNORMAL LOW (ref 3.5–5.1)
Sodium: 139 mEq/L (ref 135–145)

## 2011-10-20 LAB — URINALYSIS, ROUTINE W REFLEX MICROSCOPIC
Bilirubin Urine: NEGATIVE
Glucose, UA: NEGATIVE mg/dL
Hgb urine dipstick: NEGATIVE
Ketones, ur: NEGATIVE mg/dL
Leukocytes, UA: NEGATIVE
Nitrite: NEGATIVE
Protein, ur: NEGATIVE mg/dL
Specific Gravity, Urine: 1.017 (ref 1.005–1.030)
Urobilinogen, UA: 1 mg/dL (ref 0.0–1.0)
pH: 7.5 (ref 5.0–8.0)

## 2011-10-20 LAB — PROTIME-INR
INR: 0.89 (ref 0.00–1.49)
Prothrombin Time: 12.2 seconds (ref 11.6–15.2)

## 2011-10-20 LAB — CBC WITH DIFFERENTIAL/PLATELET
Basophils Absolute: 0 10*3/uL (ref 0.0–0.1)
Basophils Relative: 1 % (ref 0–1)
Eosinophils Absolute: 0.2 10*3/uL (ref 0.0–0.7)
Eosinophils Relative: 2 % (ref 0–5)
HCT: 36 % (ref 36.0–46.0)
Hemoglobin: 11.7 g/dL — ABNORMAL LOW (ref 12.0–15.0)
Lymphocytes Relative: 39 % (ref 12–46)
Lymphs Abs: 3.3 10*3/uL (ref 0.7–4.0)
MCH: 26.4 pg (ref 26.0–34.0)
MCHC: 32.5 g/dL (ref 30.0–36.0)
MCV: 81.1 fL (ref 78.0–100.0)
Monocytes Absolute: 0.9 10*3/uL (ref 0.1–1.0)
Monocytes Relative: 10 % (ref 3–12)
Neutro Abs: 4.2 10*3/uL (ref 1.7–7.7)
Neutrophils Relative %: 49 % (ref 43–77)
Platelets: 246 10*3/uL (ref 150–400)
RBC: 4.44 MIL/uL (ref 3.87–5.11)
RDW: 15.3 % (ref 11.5–15.5)
WBC: 8.7 10*3/uL (ref 4.0–10.5)

## 2011-10-20 LAB — APTT: aPTT: 34 seconds (ref 24–37)

## 2011-10-20 LAB — MAGNESIUM: Magnesium: 1.9 mg/dL (ref 1.5–2.5)

## 2011-10-20 LAB — GLUCOSE, CAPILLARY: Glucose-Capillary: 108 mg/dL — ABNORMAL HIGH (ref 70–99)

## 2011-10-20 MED ORDER — METHOCARBAMOL 500 MG PO TABS: 500.0000 mg | ORAL_TABLET | Freq: Two times a day (BID) | ORAL | Status: AC

## 2011-10-20 MED ORDER — POTASSIUM CHLORIDE CRYS ER 20 MEQ PO TBCR
20.0000 meq | EXTENDED_RELEASE_TABLET | Freq: Once | ORAL | Status: AC
  Administered 2011-10-21: 20 meq via ORAL
  Filled 2011-10-20: qty 1

## 2011-10-20 MED ORDER — IBUPROFEN 600 MG PO TABS: 600.0000 mg | ORAL_TABLET | Freq: Four times a day (QID) | ORAL | Status: AC | PRN

## 2011-10-20 MED ORDER — METHOCARBAMOL 500 MG PO TABS
1000.0000 mg | ORAL_TABLET | Freq: Once | ORAL | Status: AC
Start: 2011-10-20 — End: 2011-10-20
  Administered 2011-10-20: 1000 mg via ORAL
  Filled 2011-10-20: qty 2

## 2011-10-20 NOTE — ED Notes (Signed)
Pt c/o pain to L lower leg, toes and fingers x 2 weeks. Pt states she has been out of medications. Pt states she is currently taking someone else's Metformin and KCL. Pt states she was a pt at [facility]. CBG 108

## 2011-10-20 NOTE — ED Notes (Signed)
Attempted to obtain urine sample.  Pt stated that she did not have to urinate at this time

## 2011-10-20 NOTE — ED Provider Notes (Signed)
History     CSN: [id number]  Arrival date & time 10/20/11  2003   First MD Initiated Contact with Patient 10/20/11 2126      Chief Complaint  Patient presents with  . Extremity Pain    (Consider location/radiation/quality/duration/timing/severity/associated sxs/prior treatment) HPI Pt present with L calf cramping starting today. States she had some tingling to the L foot. No fever, chills, SOB, CP. Pt has been out of her medications for several weeks. States she has been trying to hydrate by drinking water Past Medical History  Diagnosis Date  . Diabetes mellitus   . Hypertension   . Coronary artery disease   . Depression   . Fibromyalgia   . MI (myocardial infarction)     Past Surgical History  Procedure Date  . Abdominal hysterectomy   . Breast surgery   . Cardiac catheterization     1995    Family History  Problem Relation Age of Onset  . Hypertension Mother   . Diabetes Mother   . Stroke Mother   . Hypertension Other   . Diabetes Other     History  Substance Use Topics  . Smoking status: Current Every Day Smoker -- 0.2 packs/day    Types: Cigarettes  . Smokeless tobacco: Never Used  . Alcohol Use: No    OB History    Grav Para Term Preterm Abortions TAB SAB Ect Mult Living                  Review of Systems  Constitutional: Negative for fever and chills.  Respiratory: Negative for cough and shortness of breath.   Cardiovascular: Negative for chest pain, palpitations and leg swelling.  Gastrointestinal: Negative for nausea, vomiting, abdominal pain and diarrhea.  Musculoskeletal: Positive for myalgias. Negative for arthralgias.  Skin: Negative for rash and wound.  Neurological: Positive for numbness. Negative for weakness, light-headedness and headaches.    Allergies  Procardia  Home Medications   Current Outpatient Rx  Name Route Sig Dispense Refill  . AMLODIPINE BESYLATE 10 MG PO TABS Oral Take 10 mg by mouth daily.    .  ATENOLOL-CHLORTHALIDONE 50-25 MG PO TABS Oral Take 1 tablet by mouth daily.    . BUPROPION HCL ER (SR) 150 MG PO TB12 Oral Take 150 mg by mouth 2 (two) times daily.    Marland Kitchen METFORMIN HCL 500 MG PO TABS Oral Take 500 mg by mouth 2 (two) times daily with a meal.    . POTASSIUM CHLORIDE CRYS ER 10 MEQ PO TBCR Oral Take 10 mEq by mouth 2 (two) times daily.    . VENLAFAXINE HCL ER 75 MG PO CP24 Oral Take 75 mg by mouth 2 times daily at 12 noon and 4 pm.    . IBUPROFEN 600 MG PO TABS Oral Take 1 tablet (600 mg total) by mouth every 6 (six) hours as needed for pain. 30 tablet 0  . METHOCARBAMOL 500 MG PO TABS Oral Take 1 tablet (500 mg total) by mouth 2 (two) times daily. 20 tablet 0    BP 143/88  Pulse 91  Temp 98.2 F (36.8 C) (Oral)  Resp 18  Wt 259 lb 3.2 oz (117.572 kg)  SpO2 99%  Physical Exam  Nursing note and vitals reviewed. Constitutional: She is oriented to person, place, and time. She appears well-developed and well-nourished. No distress.  HENT:  Head: Normocephalic and atraumatic.  Mouth/Throat: Oropharynx is clear and moist.  Eyes: EOM are normal. Pupils are equal, round, and  reactive to light.  Neck: Normal range of motion. Neck supple.  Cardiovascular: Normal rate and regular rhythm.   Pulmonary/Chest: Effort normal and breath sounds normal. No respiratory distress. She has no wheezes. She has no rales.  Abdominal: Soft. Bowel sounds are normal. There is no tenderness. There is no rebound and no guarding.  Musculoskeletal: Normal range of motion. She exhibits tenderness (L calf tenderness to palpation. no trauma, swelling. ). She exhibits no edema.       Radial pulses 2+.   Neurological: She is alert and oriented to person, place, and time.       5/5 motor, sensation intact  Skin: Skin is warm and dry. No rash noted. No erythema.  Psychiatric: She has a normal mood and affect. Her behavior is normal.    ED Course  Procedures (including critical care time)  Labs Reviewed    GLUCOSE, CAPILLARY - Abnormal; Notable for the following:    Glucose-Capillary 108 (*)     All other components within normal limits  CBC WITH DIFFERENTIAL - Abnormal; Notable for the following:    Hemoglobin 11.7 (*)     All other components within normal limits  BASIC METABOLIC PANEL - Abnormal; Notable for the following:    Potassium 3.1 (*)     CO2 34 (*)     Glucose, Bld 102 (*)     GFR calc non Af Amer 76 (*)     GFR calc Af Amer 88 (*)     All other components within normal limits  URINALYSIS, ROUTINE W REFLEX MICROSCOPIC  PROTIME-INR  APTT  MAGNESIUM   No results found.   1. Calf pain   2. Hypokalemia       MDM   Low suspicion for DVT likely muscle cramps. Will bring back in AM for [place] r/o DVT.        Loren Racer, MD 10/20/11 2329

## 2011-10-20 NOTE — ED Notes (Signed)
Pt sts she began experiencing left calf pain earlier today. C/o right leg pain as well, but not as bad. Sts it was sudden onset and she has never experienced this pain before. Denies recent travel.

## 2011-10-21 ENCOUNTER — Ambulatory Visit (HOSPITAL_COMMUNITY)
Admission: RE | Admit: 2011-10-21 | Discharge: 2011-10-21 | Disposition: A | Discharge status: Home / Self Care | Payer: Self-pay | Source: Ambulatory Visit | Attending: Emergency Medicine | Admitting: Emergency Medicine

## 2011-10-21 DIAGNOSIS — M79609 Pain in unspecified limb: Secondary | ICD-10-CM

## 2011-10-21 NOTE — Progress Notes (Signed)
VASCULAR LAB PRELIMINARY  PRELIMINARY  PRELIMINARY  PRELIMINARY  Left lower extremity venous duplex completed.    Preliminary report:Left:  No evidence of DVT, superficial thrombosis, or Baker's cyst.  Rozanne Heumann, RVS 10/21/2011, 11:30 AM

## 2012-03-28 ENCOUNTER — Emergency Department (HOSPITAL_COMMUNITY)
Admission: EM | Admit: 2012-03-28 | Discharge: 2012-03-28 | Disposition: A | Discharge status: Home / Self Care | Payer: Self-pay | Attending: Emergency Medicine | Admitting: Emergency Medicine

## 2012-03-28 ENCOUNTER — Encounter (HOSPITAL_COMMUNITY): Payer: Self-pay | Admitting: Emergency Medicine

## 2012-03-28 DIAGNOSIS — E119 Type 2 diabetes mellitus without complications: Secondary | ICD-10-CM | POA: Insufficient documentation

## 2012-03-28 DIAGNOSIS — M79609 Pain in unspecified limb: Secondary | ICD-10-CM | POA: Insufficient documentation

## 2012-03-28 DIAGNOSIS — F329 Major depressive disorder, single episode, unspecified: Secondary | ICD-10-CM | POA: Insufficient documentation

## 2012-03-28 DIAGNOSIS — I2510 Atherosclerotic heart disease of native coronary artery without angina pectoris: Secondary | ICD-10-CM | POA: Insufficient documentation

## 2012-03-28 DIAGNOSIS — F17200 Nicotine dependence, unspecified, uncomplicated: Secondary | ICD-10-CM | POA: Insufficient documentation

## 2012-03-28 DIAGNOSIS — F3289 Other specified depressive episodes: Secondary | ICD-10-CM | POA: Insufficient documentation

## 2012-03-28 DIAGNOSIS — Z79899 Other long term (current) drug therapy: Secondary | ICD-10-CM | POA: Insufficient documentation

## 2012-03-28 DIAGNOSIS — I10 Essential (primary) hypertension: Secondary | ICD-10-CM | POA: Insufficient documentation

## 2012-03-28 DIAGNOSIS — Z7982 Long term (current) use of aspirin: Secondary | ICD-10-CM | POA: Insufficient documentation

## 2012-03-28 DIAGNOSIS — IMO0001 Reserved for inherently not codable concepts without codable children: Secondary | ICD-10-CM | POA: Insufficient documentation

## 2012-03-28 DIAGNOSIS — I252 Old myocardial infarction: Secondary | ICD-10-CM | POA: Insufficient documentation

## 2012-03-28 DIAGNOSIS — M79605 Pain in left leg: Secondary | ICD-10-CM

## 2012-03-28 MED ORDER — OXYCODONE-ACETAMINOPHEN 5-325 MG PO TABS: ORAL_TABLET | ORAL | Status: DC

## 2012-03-28 MED ORDER — ONDANSETRON 4 MG PO TBDP
4.0000 mg | ORAL_TABLET | Freq: Once | ORAL | Status: AC
  Administered 2012-03-28: 4 mg via ORAL
  Filled 2012-03-28: qty 1

## 2012-03-28 MED ORDER — MORPHINE SULFATE 4 MG/ML IJ SOLN
6.0000 mg | Freq: Once | INTRAMUSCULAR | Status: AC
Start: 2012-03-28 — End: 2012-03-28
  Administered 2012-03-28: 6 mg via INTRAMUSCULAR
  Filled 2012-03-28 (×2): qty 1

## 2012-03-28 NOTE — ED Provider Notes (Signed)
  Medical screening examination/treatment/procedure(s) were performed by non-physician practitioner and as supervising physician I was immediately available for consultation/collaboration.    Tori Cupps, MD 03/28/12 1616 

## 2012-03-28 NOTE — Progress Notes (Signed)
Bilateral:  No evidence of DVT, superficial thrombosis, or Baker's Cyst.   

## 2012-03-28 NOTE — ED Notes (Signed)
Pt reports chronic right leg pain, states she was a pt at [facility] and has been unable to get meds filled.

## 2012-03-28 NOTE — ED Provider Notes (Signed)
History     CSN: [id number]  Arrival date & time 03/28/12  1237   First MD Initiated Contact with Patient 03/28/12 1253      Chief Complaint  Patient presents with  . Leg Pain    (Consider location/radiation/quality/duration/timing/severity/associated sxs/prior treatment) HPI  Stacey Fletcher is a 62 y.o. female complaining of right leg pain worsening over the course of the week. Patient states the pain initially started in the calf and moved up to the thigh. She denies any trauma, pain is not exacerbated by palpation, position or weight bearing. She describes her pain as severe and it keeps her from sleeping at night. Patient denies any history of DVT of clotting disorder, active shortness of breath, chest pain, palpitations.  Past Medical History  Diagnosis Date  . Diabetes mellitus   . Hypertension   . Coronary artery disease   . Depression   . Fibromyalgia   . MI (myocardial infarction)     Past Surgical History  Procedure Laterality Date  . Abdominal hysterectomy    . Breast surgery    . Cardiac catheterization      1995    Family History  Problem Relation Age of Onset  . Hypertension Mother   . Diabetes Mother   . Stroke Mother   . Hypertension Other   . Diabetes Other     History  Substance Use Topics  . Smoking status: Current Every Day Smoker -- 0.25 packs/day    Types: Cigarettes  . Smokeless tobacco: Never Used  . Alcohol Use: No    OB History   Grav Para Term Preterm Abortions TAB SAB Ect Mult Living                  Review of Systems  Constitutional: Negative for fever.  Respiratory: Negative for shortness of breath.   Cardiovascular: Negative for chest pain.  Gastrointestinal: Negative for nausea, vomiting, abdominal pain and diarrhea.  Musculoskeletal: Positive for myalgias.  All other systems reviewed and are negative.    Allergies  Procardia  Home Medications   Current Outpatient Rx  Name  Route  Sig  Dispense  Refill  .  amLODipine (NORVASC) 10 MG tablet   Oral   Take 10 mg by mouth daily.         Marland Kitchen aspirin 81 MG chewable tablet   Oral   Chew 81 mg by mouth daily.         Marland Kitchen atenolol-chlorthalidone (TENORETIC) 50-25 MG per tablet   Oral   Take 1 tablet by mouth daily.         Marland Kitchen buPROPion (WELLBUTRIN SR) 150 MG 12 hr tablet   Oral   Take 150 mg by mouth 2 (two) times daily.         Marland Kitchen loratadine (CLARITIN) 10 MG tablet   Oral   Take 10 mg by mouth daily as needed for allergies.         . metFORMIN (GLUCOPHAGE) 500 MG tablet   Oral   Take 1,000 mg by mouth daily with breakfast.          . potassium chloride SA (K-DUR,KLOR-CON) 20 MEQ tablet   Oral   Take 20 mEq by mouth 2 (two) times daily.         Marland Kitchen venlafaxine XR (EFFEXOR-XR) 75 MG 24 hr capsule   Oral   Take 75 mg by mouth 2 (two) times daily.          Marland Kitchen oxyCODONE-acetaminophen (PERCOCET/ROXICET)  5-325 MG per tablet      1 to 2 tabs PO q6hrs  PRN for pain   15 tablet   0     BP 123/66  Pulse 96  Temp(Src) 97.6 F (36.4 C) (Oral)  Resp 20  SpO2 94%  Physical Exam  Nursing note and vitals reviewed. Constitutional: She is oriented to person, place, and time. She appears well-developed and well-nourished. No distress.  HENT:  Head: Normocephalic.  Eyes: Conjunctivae and EOM are normal.  Cardiovascular: Normal rate.   Pulmonary/Chest: Effort normal. No stridor.  Musculoskeletal: Normal range of motion. She exhibits no edema.  legs do not show any calf asymmetry, Homan sign is positive, there are no superficial varicosities or palpable cords, dorsalis pedis is 2+ bilaterally there is no edema, erythema, induration or warmth.  Neurological: She is alert and oriented to person, place, and time.  Psychiatric: She has a normal mood and affect.    ED Course  Procedures (including critical care time)  Labs Reviewed - No data to display No results found.   1. Left leg pain       MDM  Worsening calf pain  traveling up to the thigh. Although her physical exam is not suggestive of DVT I think it is reasonable to obtain a venous Doppler. Her oxygen saturation is 94% on room air however she is a smoker. Patient is not tachycardic and has no subjective shortness of breath.  Doppler shows no clot. Patient will be discharged with pain control medications. She is an diabetic with no outpatient care. I will encourage her to try to follow with the [facility] and the [facility].   Pt verbalized understanding and agrees with care plan. Outpatient follow-up and return precautions given.          Wynetta Emery, PA-C 03/28/12 1529

## 2012-06-16 ENCOUNTER — Emergency Department (HOSPITAL_COMMUNITY): Payer: Self-pay

## 2012-06-16 ENCOUNTER — Encounter (HOSPITAL_COMMUNITY): Payer: Self-pay | Admitting: *Deleted

## 2012-06-16 ENCOUNTER — Emergency Department (HOSPITAL_COMMUNITY)
Admission: EM | Admit: 2012-06-16 | Discharge: 2012-06-16 | Disposition: A | Discharge status: Home / Self Care | Payer: Self-pay | Attending: Emergency Medicine | Admitting: Emergency Medicine

## 2012-06-16 DIAGNOSIS — I252 Old myocardial infarction: Secondary | ICD-10-CM | POA: Insufficient documentation

## 2012-06-16 DIAGNOSIS — I2510 Atherosclerotic heart disease of native coronary artery without angina pectoris: Secondary | ICD-10-CM | POA: Insufficient documentation

## 2012-06-16 DIAGNOSIS — Z7982 Long term (current) use of aspirin: Secondary | ICD-10-CM | POA: Insufficient documentation

## 2012-06-16 DIAGNOSIS — F3289 Other specified depressive episodes: Secondary | ICD-10-CM | POA: Insufficient documentation

## 2012-06-16 DIAGNOSIS — F329 Major depressive disorder, single episode, unspecified: Secondary | ICD-10-CM | POA: Insufficient documentation

## 2012-06-16 DIAGNOSIS — R079 Chest pain, unspecified: Secondary | ICD-10-CM | POA: Insufficient documentation

## 2012-06-16 DIAGNOSIS — Z79899 Other long term (current) drug therapy: Secondary | ICD-10-CM | POA: Insufficient documentation

## 2012-06-16 DIAGNOSIS — M25569 Pain in unspecified knee: Secondary | ICD-10-CM | POA: Insufficient documentation

## 2012-06-16 DIAGNOSIS — F17200 Nicotine dependence, unspecified, uncomplicated: Secondary | ICD-10-CM | POA: Insufficient documentation

## 2012-06-16 DIAGNOSIS — I10 Essential (primary) hypertension: Secondary | ICD-10-CM | POA: Insufficient documentation

## 2012-06-16 DIAGNOSIS — R209 Unspecified disturbances of skin sensation: Secondary | ICD-10-CM | POA: Insufficient documentation

## 2012-06-16 DIAGNOSIS — M25562 Pain in left knee: Secondary | ICD-10-CM

## 2012-06-16 DIAGNOSIS — E119 Type 2 diabetes mellitus without complications: Secondary | ICD-10-CM | POA: Insufficient documentation

## 2012-06-16 DIAGNOSIS — R202 Paresthesia of skin: Secondary | ICD-10-CM

## 2012-06-16 LAB — CBC WITH DIFFERENTIAL/PLATELET
Basophils Absolute: 0 10*3/uL (ref 0.0–0.1)
Basophils Relative: 0 % (ref 0–1)
Eosinophils Absolute: 0.2 10*3/uL (ref 0.0–0.7)
Eosinophils Relative: 3 % (ref 0–5)
HCT: 35.9 % — ABNORMAL LOW (ref 36.0–46.0)
Hemoglobin: 12 g/dL (ref 12.0–15.0)
Lymphocytes Relative: 44 % (ref 12–46)
Lymphs Abs: 3.7 10*3/uL (ref 0.7–4.0)
MCH: 26.7 pg (ref 26.0–34.0)
MCHC: 33.4 g/dL (ref 30.0–36.0)
MCV: 79.8 fL (ref 78.0–100.0)
Monocytes Absolute: 0.5 10*3/uL (ref 0.1–1.0)
Monocytes Relative: 7 % (ref 3–12)
Neutro Abs: 3.9 10*3/uL (ref 1.7–7.7)
Neutrophils Relative %: 47 % (ref 43–77)
Platelets: 252 10*3/uL (ref 150–400)
RBC: 4.5 MIL/uL (ref 3.87–5.11)
RDW: 15 % (ref 11.5–15.5)
WBC: 8.3 10*3/uL (ref 4.0–10.5)

## 2012-06-16 LAB — COMPREHENSIVE METABOLIC PANEL
ALT: 14 U/L (ref 0–35)
AST: 21 U/L (ref 0–37)
Albumin: 3.7 g/dL (ref 3.5–5.2)
Alkaline Phosphatase: 98 U/L (ref 39–117)
BUN: 16 mg/dL (ref 6–23)
CO2: 28 mEq/L (ref 19–32)
Calcium: 9.3 mg/dL (ref 8.4–10.5)
Chloride: 99 mEq/L (ref 96–112)
Creatinine, Ser: 0.86 mg/dL (ref 0.50–1.10)
GFR calc Af Amer: 82 mL/min — ABNORMAL LOW (ref >90)
GFR calc non Af Amer: 70 mL/min — ABNORMAL LOW (ref >90)
Glucose, Bld: 147 mg/dL — ABNORMAL HIGH (ref 70–99)
Potassium: 3 mEq/L — ABNORMAL LOW (ref 3.5–5.1)
Sodium: 138 mEq/L (ref 135–145)
Total Bilirubin: 0.3 mg/dL (ref 0.3–1.2)
Total Protein: 6.8 g/dL (ref 6.0–8.3)

## 2012-06-16 LAB — D-DIMER, QUANTITATIVE: D-Dimer, Quant: <0.27 ug/mL-FEU (ref 0.00–0.48)

## 2012-06-16 LAB — GLUCOSE, CAPILLARY: Glucose-Capillary: 133 mg/dL — ABNORMAL HIGH (ref 70–99)

## 2012-06-16 LAB — POCT I-STAT TROPONIN I: Troponin i, poc: 0 ng/mL (ref 0.00–0.08)

## 2012-06-16 MED ORDER — HYDROCODONE-ACETAMINOPHEN 5-325 MG PO TABS
1.0000 | ORAL_TABLET | Freq: Once | ORAL | Status: AC
  Administered 2012-06-16: 1 via ORAL
  Filled 2012-06-16: qty 1

## 2012-06-16 MED ORDER — POTASSIUM CHLORIDE CRYS ER 20 MEQ PO TBCR
40.0000 meq | EXTENDED_RELEASE_TABLET | Freq: Once | ORAL | Status: AC
  Administered 2012-06-16: 40 meq via ORAL
  Filled 2012-06-16: qty 2

## 2012-06-16 MED ORDER — HYDROCODONE-ACETAMINOPHEN 5-325 MG PO TABS: 1.0000 | ORAL_TABLET | Freq: Four times a day (QID) | ORAL | Status: DC | PRN

## 2012-06-16 NOTE — ED Notes (Signed)
Pt c/o numbness on left side of body since 2pm; c/o pain in back of left calf; toes/fingers tingling; states having trouble with gait; states "whole left side is out of order"; equal facial grimacing;; no arm drift; equal strength

## 2012-06-16 NOTE — ED Provider Notes (Signed)
History    CSN: [id number] Arrival date & time 06/16/12  2006 First MD Initiated Contact with Patient 06/16/12 2033      Chief Complaint  Patient presents with  . Numbness  . Chest Pain    HPI Patient presents to the emergency room complaints of numbness on the left side of her body, intermittent sharp chest pain, pain in the back of her left calf, and tingling in her toes and fingers.  The symptoms started today. Patient started experiencing primarily pins and needles sensation in her left arm and left leg. During the day she's also had intermittent episodes of sharp chest pain. She has not felt short of breath or nausea with it. Patient has not noticed any swelling in her legs does feel a sharp pain behind her left calf. She has no history of blood clots. She does have history of diabetes hypertension and fibromyalgia.   Past Medical History  Diagnosis Date  . Diabetes mellitus   . Hypertension   . Coronary artery disease   . Depression   . Fibromyalgia   . MI (myocardial infarction)     Past Surgical History  Procedure Laterality Date  . Abdominal hysterectomy    . Breast surgery    . Cardiac catheterization      1995    Family History  Problem Relation Age of Onset  . Hypertension Mother   . Diabetes Mother   . Stroke Mother   . Hypertension Other   . Diabetes Other     History  Substance Use Topics  . Smoking status: Current Every Day Smoker -- 0.25 packs/day    Types: Cigarettes  . Smokeless tobacco: Never Used  . Alcohol Use: No    OB History   Grav Para Term Preterm Abortions TAB SAB Ect Mult Living                  Review of Systems  All other systems reviewed and are negative.    Allergies  Procardia  Home Medications   Current Outpatient Rx  Name  Route  Sig  Dispense  Refill  . amLODipine (NORVASC) 10 MG tablet   Oral   Take 10 mg by mouth daily.         Marland Kitchen aspirin 81 MG chewable tablet   Oral   Chew 81 mg by mouth daily.          Marland Kitchen atenolol-chlorthalidone (TENORETIC) 50-25 MG per tablet   Oral   Take 1 tablet by mouth daily.         Marland Kitchen buPROPion (WELLBUTRIN SR) 150 MG 12 hr tablet   Oral   Take 150 mg by mouth 2 (two) times daily.         Marland Kitchen levothyroxine (SYNTHROID, LEVOTHROID) 50 MCG tablet   Oral   Take 50 mcg by mouth daily before breakfast.         . loratadine (CLARITIN) 10 MG tablet   Oral   Take 10 mg by mouth daily as needed for allergies.         . metFORMIN (GLUCOPHAGE) 500 MG tablet   Oral   Take 1,000 mg by mouth daily with breakfast.          . potassium chloride SA (K-DUR,KLOR-CON) 20 MEQ tablet   Oral   Take 20 mEq by mouth 2 (two) times daily.         Marland Kitchen venlafaxine XR (EFFEXOR-XR) 75 MG 24 hr capsule   Oral  Take 75 mg by mouth 2 (two) times daily.          Marland Kitchen HYDROcodone-acetaminophen (NORCO) 5-325 MG per tablet   Oral   Take 1-2 tablets by mouth every 6 (six) hours as needed for pain.   20 tablet   0     BP 119/69  Pulse 94  Temp(Src) 98.9 F (37.2 C) (Oral)  Resp 16  SpO2 98%  Physical Exam  Nursing note and vitals reviewed. Constitutional: She is oriented to person, place, and time. She appears well-developed and well-nourished. No distress.  HENT:  Head: Normocephalic and atraumatic.  Right Ear: External ear normal.  Left Ear: External ear normal.  Mouth/Throat: Oropharynx is clear and moist.  Eyes: Conjunctivae are normal. Right eye exhibits no discharge. Left eye exhibits no discharge. No scleral icterus.  Neck: Neck supple. No tracheal deviation present.  Mild tenderness palpation in the paraspinal muscles and cervical region  Cardiovascular: Normal rate, regular rhythm and intact distal pulses.   Pulmonary/Chest: Effort normal and breath sounds normal. No stridor. No respiratory distress. She has no wheezes. She has no rales.  Abdominal: Soft. Bowel sounds are normal. She exhibits no distension. There is no tenderness. There is no rebound and  no guarding.  Musculoskeletal: She exhibits tenderness. She exhibits no edema.  Tenderness to palpation behind the left calf and popliteal fossa, no edema no cords, extremely warm without cyanosis, normal pulses in all 4 extremities  Neurological: She is alert and oriented to person, place, and time. She has normal strength. No cranial nerve deficit ( no gross defecits noted) or sensory deficit. She exhibits normal muscle tone. She displays no seizure activity. Coordination normal.  No pronator drift bilateral upper extrem, able to hold both legs off bed for 5 seconds, sensation intact in all extremities, no visual field cuts, no left or right sided neglect  Skin: Skin is warm and dry. No rash noted.  Psychiatric: She has a normal mood and affect.    ED Course  Procedures (including critical care time) EKG Normal sinus rhythm Normal axis normal intervals Rate 72 Normal ST-T waves EKG unchanged when compared to prior EKG dated 10/18/1999 Labs Reviewed  CBC WITH DIFFERENTIAL - Abnormal; Notable for the following:    HCT 35.9 (*)    All other components within normal limits  COMPREHENSIVE METABOLIC PANEL - Abnormal; Notable for the following:    Potassium 3.0 (*)    Glucose, Bld 147 (*)    GFR calc non Af Amer 70 (*)    GFR calc Af Amer 82 (*)    All other components within normal limits  GLUCOSE, CAPILLARY - Abnormal; Notable for the following:    Glucose-Capillary 133 (*)    All other components within normal limits  D-DIMER, QUANTITATIVE  POCT I-STAT TROPONIN I   Dg Chest 2 View  06/16/2012  *RADIOLOGY REPORT*  Clinical Data: Chest pain and numbness, smoking history  CHEST - 2 VIEW  Comparison: Chest x-ray of 02/27/2011  Findings: No active infiltrate or effusion is seen.  Mediastinal contours appear normal.  The heart is within upper limits of normal and stable.  No bony abnormality is seen.  IMPRESSION: Stable chest x-ray.  No active lung disease.   Original Report Authenticated  By: Dwyane Dee, M.D.    Ct Head Wo Contrast  06/16/2012  *RADIOLOGY REPORT*  Clinical Data: Left leg numbness and weakness  CT HEAD WITHOUT CONTRAST  Technique:  Contiguous axial images were obtained from the  base of the skull through the vertex without contrast.  Comparison: 03/30/2008  Findings: Similar appearance to the dural based calcific lesion along the left convexity on series 2 image 12, measuring up to 11 mm in diameter, most in keeping with a meningioma. No significant mass effect.  Otherwise, there is no evidence for acute hemorrhage, hydrocephalus, mass lesion, or abnormal extra-axial fluid collection.  No definite CT evidence for acute infarction.  The visualized paranasal sinuses and mastoid air cells are predominately clear.  IMPRESSION: No CT evidence for an acute intracranial abnormality.  MRI has increased sensitivity for acute ischemia if clinical concern process.  Probable small calcified meningioma along the left convexity is similar to prior.   Original Report Authenticated By: Jearld Lesch, M.D.      1. Paresthesia   2. Knee pain, acute, left       MDM  Paresthesia I will see patient for stroke or TIA. Patient's primary complaint at this point his pain and discomfort behind her left knee. She does have history of fibromyalgia. It is possible that some of the symptoms are related to that. Regardless, despite the tingling that she experienced the left side he didn't feel that stroke is likely consistent with a primary issues knee pain at this point. Her neurologic exam is normal and reassuring.  Chest pain This was fleeting and atypical. I do not feel there is any evidence to suggest acute coronary syndrome. A D-dimer test was done. It was negative I doubt DVT or PE  Discussed the findings with the patient. I recommend followup with her primary Dr. Your prescription for medications for pain. Is possible her knee discomfort may be related to a Baker's cyst.  She did not  have any trauma I did not feel that plain films were necessary        Celene Kras, MD 06/16/12 2255

## 2013-05-06 ENCOUNTER — Encounter (HOSPITAL_COMMUNITY): Payer: Self-pay | Admitting: Emergency Medicine

## 2013-05-06 ENCOUNTER — Emergency Department (HOSPITAL_COMMUNITY): Payer: Self-pay

## 2013-05-06 ENCOUNTER — Inpatient Hospital Stay (HOSPITAL_COMMUNITY)
Admission: EM | Admit: 2013-05-06 | Discharge: 2013-05-08 | DRG: 866 | Disposition: A | Discharge status: Home / Self Care | Payer: Self-pay | Attending: Family Medicine | Admitting: Family Medicine

## 2013-05-06 DIAGNOSIS — Z6836 Body mass index (BMI) 36.0-36.9, adult: Secondary | ICD-10-CM

## 2013-05-06 DIAGNOSIS — I10 Essential (primary) hypertension: Secondary | ICD-10-CM | POA: Diagnosis present

## 2013-05-06 DIAGNOSIS — E119 Type 2 diabetes mellitus without complications: Secondary | ICD-10-CM | POA: Diagnosis present

## 2013-05-06 DIAGNOSIS — E049 Nontoxic goiter, unspecified: Secondary | ICD-10-CM | POA: Diagnosis present

## 2013-05-06 DIAGNOSIS — F329 Major depressive disorder, single episode, unspecified: Secondary | ICD-10-CM | POA: Diagnosis present

## 2013-05-06 DIAGNOSIS — Z7982 Long term (current) use of aspirin: Secondary | ICD-10-CM

## 2013-05-06 DIAGNOSIS — I252 Old myocardial infarction: Secondary | ICD-10-CM

## 2013-05-06 DIAGNOSIS — R000 Tachycardia, unspecified: Secondary | ICD-10-CM

## 2013-05-06 DIAGNOSIS — R0789 Other chest pain: Secondary | ICD-10-CM | POA: Diagnosis present

## 2013-05-06 DIAGNOSIS — I498 Other specified cardiac arrhythmias: Secondary | ICD-10-CM | POA: Diagnosis present

## 2013-05-06 DIAGNOSIS — R7989 Other specified abnormal findings of blood chemistry: Secondary | ICD-10-CM | POA: Diagnosis present

## 2013-05-06 DIAGNOSIS — R509 Fever, unspecified: Secondary | ICD-10-CM | POA: Diagnosis present

## 2013-05-06 DIAGNOSIS — R112 Nausea with vomiting, unspecified: Secondary | ICD-10-CM | POA: Diagnosis present

## 2013-05-06 DIAGNOSIS — R079 Chest pain, unspecified: Secondary | ICD-10-CM | POA: Diagnosis present

## 2013-05-06 DIAGNOSIS — E039 Hypothyroidism, unspecified: Secondary | ICD-10-CM | POA: Diagnosis present

## 2013-05-06 DIAGNOSIS — Z888 Allergy status to other drugs, medicaments and biological substances status: Secondary | ICD-10-CM

## 2013-05-06 DIAGNOSIS — E871 Hypo-osmolality and hyponatremia: Secondary | ICD-10-CM | POA: Diagnosis present

## 2013-05-06 DIAGNOSIS — F3289 Other specified depressive episodes: Secondary | ICD-10-CM | POA: Diagnosis present

## 2013-05-06 DIAGNOSIS — R109 Unspecified abdominal pain: Secondary | ICD-10-CM

## 2013-05-06 DIAGNOSIS — E042 Nontoxic multinodular goiter: Secondary | ICD-10-CM | POA: Diagnosis present

## 2013-05-06 DIAGNOSIS — Z8249 Family history of ischemic heart disease and other diseases of the circulatory system: Secondary | ICD-10-CM

## 2013-05-06 DIAGNOSIS — Z79899 Other long term (current) drug therapy: Secondary | ICD-10-CM

## 2013-05-06 DIAGNOSIS — R197 Diarrhea, unspecified: Secondary | ICD-10-CM | POA: Diagnosis present

## 2013-05-06 DIAGNOSIS — IMO0001 Reserved for inherently not codable concepts without codable children: Secondary | ICD-10-CM | POA: Diagnosis present

## 2013-05-06 DIAGNOSIS — I2510 Atherosclerotic heart disease of native coronary artery without angina pectoris: Secondary | ICD-10-CM | POA: Diagnosis present

## 2013-05-06 DIAGNOSIS — R1110 Vomiting, unspecified: Secondary | ICD-10-CM

## 2013-05-06 DIAGNOSIS — B9789 Other viral agents as the cause of diseases classified elsewhere: Principal | ICD-10-CM | POA: Diagnosis present

## 2013-05-06 DIAGNOSIS — Z833 Family history of diabetes mellitus: Secondary | ICD-10-CM

## 2013-05-06 DIAGNOSIS — E876 Hypokalemia: Secondary | ICD-10-CM | POA: Diagnosis present

## 2013-05-06 DIAGNOSIS — R1115 Cyclical vomiting syndrome unrelated to migraine: Secondary | ICD-10-CM | POA: Diagnosis present

## 2013-05-06 DIAGNOSIS — F17200 Nicotine dependence, unspecified, uncomplicated: Secondary | ICD-10-CM | POA: Diagnosis present

## 2013-05-06 DIAGNOSIS — K769 Liver disease, unspecified: Secondary | ICD-10-CM | POA: Diagnosis present

## 2013-05-06 LAB — COMPREHENSIVE METABOLIC PANEL
ALT: 41 U/L — ABNORMAL HIGH (ref 0–35)
AST: 39 U/L — ABNORMAL HIGH (ref 0–37)
Albumin: 3.9 g/dL (ref 3.5–5.2)
Alkaline Phosphatase: 92 U/L (ref 39–117)
BUN: 18 mg/dL (ref 6–23)
CO2: 25 mEq/L (ref 19–32)
Calcium: 9.4 mg/dL (ref 8.4–10.5)
Chloride: 94 mEq/L — ABNORMAL LOW (ref 96–112)
Creatinine, Ser: 0.83 mg/dL (ref 0.50–1.10)
GFR calc Af Amer: 85 mL/min — ABNORMAL LOW (ref >90)
GFR calc non Af Amer: 73 mL/min — ABNORMAL LOW (ref >90)
Glucose, Bld: 118 mg/dL — ABNORMAL HIGH (ref 70–99)
Potassium: 2.9 mEq/L — CL (ref 3.7–5.3)
Sodium: 132 mEq/L — ABNORMAL LOW (ref 137–147)
Total Bilirubin: 0.4 mg/dL (ref 0.3–1.2)
Total Protein: 7.3 g/dL (ref 6.0–8.3)

## 2013-05-06 LAB — CBC WITH DIFFERENTIAL/PLATELET
Basophils Absolute: 0 10*3/uL (ref 0.0–0.1)
Basophils Relative: 0 % (ref 0–1)
Eosinophils Absolute: 0 10*3/uL (ref 0.0–0.7)
Eosinophils Relative: 0 % (ref 0–5)
HCT: 40.2 % (ref 36.0–46.0)
Hemoglobin: 13.4 g/dL (ref 12.0–15.0)
Lymphocytes Relative: 11 % — ABNORMAL LOW (ref 12–46)
Lymphs Abs: 1 10*3/uL (ref 0.7–4.0)
MCH: 27 pg (ref 26.0–34.0)
MCHC: 33.3 g/dL (ref 30.0–36.0)
MCV: 80.9 fL (ref 78.0–100.0)
Monocytes Absolute: 0.4 10*3/uL (ref 0.1–1.0)
Monocytes Relative: 4 % (ref 3–12)
Neutro Abs: 8.3 10*3/uL — ABNORMAL HIGH (ref 1.7–7.7)
Neutrophils Relative %: 86 % — ABNORMAL HIGH (ref 43–77)
Platelets: 254 10*3/uL (ref 150–400)
RBC: 4.97 MIL/uL (ref 3.87–5.11)
RDW: 14.9 % (ref 11.5–15.5)
WBC: 9.6 10*3/uL (ref 4.0–10.5)

## 2013-05-06 LAB — URINALYSIS, ROUTINE W REFLEX MICROSCOPIC
Bilirubin Urine: NEGATIVE
Glucose, UA: NEGATIVE mg/dL
Hgb urine dipstick: NEGATIVE
Ketones, ur: NEGATIVE mg/dL
Leukocytes, UA: NEGATIVE
Nitrite: NEGATIVE
Protein, ur: NEGATIVE mg/dL
Specific Gravity, Urine: 1.027 (ref 1.005–1.030)
Urobilinogen, UA: 0.2 mg/dL (ref 0.0–1.0)
pH: 6 (ref 5.0–8.0)

## 2013-05-06 LAB — MAGNESIUM: Magnesium: 1.7 mg/dL (ref 1.5–2.5)

## 2013-05-06 LAB — LIPASE, BLOOD: Lipase: 20 U/L (ref 11–59)

## 2013-05-06 LAB — PRO B NATRIURETIC PEPTIDE: Pro B Natriuretic peptide (BNP): 49.5 pg/mL (ref 0–125)

## 2013-05-06 LAB — I-STAT CG4 LACTIC ACID, ED: Lactic Acid, Venous: 2.16 mmol/L (ref 0.5–2.2)

## 2013-05-06 LAB — TROPONIN I: Troponin I: <0.3 ng/mL (ref <0.30)

## 2013-05-06 MED ORDER — POTASSIUM CHLORIDE CRYS ER 20 MEQ PO TBCR
40.0000 meq | EXTENDED_RELEASE_TABLET | Freq: Once | ORAL | Status: AC
  Administered 2013-05-06: 40 meq via ORAL
  Filled 2013-05-06: qty 2

## 2013-05-06 MED ORDER — SODIUM CHLORIDE 0.9 % IV BOLUS (SEPSIS)
500.0000 mL | Freq: Once | INTRAVENOUS | Status: AC
  Administered 2013-05-06: 500 mL via INTRAVENOUS

## 2013-05-06 MED ORDER — ONDANSETRON HCL 4 MG/2ML IJ SOLN: 4.0000 mg | Freq: Four times a day (QID) | INTRAMUSCULAR | Status: DC | PRN

## 2013-05-06 MED ORDER — ACETAMINOPHEN 500 MG PO TABS
1000.0000 mg | ORAL_TABLET | Freq: Once | ORAL | Status: AC
  Administered 2013-05-06: 1000 mg via ORAL
  Filled 2013-05-06: qty 2

## 2013-05-06 MED ORDER — ONDANSETRON HCL 4 MG PO TABS: 4.0000 mg | ORAL_TABLET | Freq: Four times a day (QID) | ORAL | Status: DC | PRN

## 2013-05-06 MED ORDER — ATENOLOL 50 MG PO TABS
50.0000 mg | ORAL_TABLET | Freq: Every day | ORAL | Status: DC
  Administered 2013-05-06 – 2013-05-08 (×3): 50 mg via ORAL
  Filled 2013-05-06 (×3): qty 1

## 2013-05-06 MED ORDER — SODIUM CHLORIDE 0.9 % IV SOLN
INTRAVENOUS | Status: AC
  Administered 2013-05-06 – 2013-05-07 (×3): via INTRAVENOUS

## 2013-05-06 MED ORDER — LEVOTHYROXINE SODIUM 50 MCG PO TABS
50.0000 ug | ORAL_TABLET | Freq: Every day | ORAL | Status: DC
  Administered 2013-05-07 – 2013-05-08 (×2): 50 ug via ORAL
  Filled 2013-05-06 (×3): qty 1

## 2013-05-06 MED ORDER — ACETAMINOPHEN 650 MG RE SUPP: 650.0000 mg | Freq: Four times a day (QID) | RECTAL | Status: DC | PRN

## 2013-05-06 MED ORDER — POTASSIUM CHLORIDE CRYS ER 20 MEQ PO TBCR
20.0000 meq | EXTENDED_RELEASE_TABLET | Freq: Two times a day (BID) | ORAL | Status: DC
  Administered 2013-05-06 – 2013-05-08 (×4): 20 meq via ORAL
  Filled 2013-05-06 (×5): qty 1

## 2013-05-06 MED ORDER — SODIUM CHLORIDE 0.9 % IJ SOLN: 3.0000 mL | Freq: Two times a day (BID) | INTRAMUSCULAR | Status: DC

## 2013-05-06 MED ORDER — ENOXAPARIN SODIUM 40 MG/0.4ML ~~LOC~~ SOLN
40.0000 mg | Freq: Every day | SUBCUTANEOUS | Status: DC
  Administered 2013-05-06 – 2013-05-07 (×2): 40 mg via SUBCUTANEOUS
  Filled 2013-05-06 (×3): qty 0.4

## 2013-05-06 MED ORDER — ASPIRIN EC 325 MG PO TBEC
325.0000 mg | DELAYED_RELEASE_TABLET | Freq: Every day | ORAL | Status: DC
Start: 2013-05-07 — End: 2013-05-08
  Administered 2013-05-07 – 2013-05-08 (×2): 325 mg via ORAL
  Filled 2013-05-06 (×2): qty 1

## 2013-05-06 MED ORDER — SODIUM CHLORIDE 0.9 % IV SOLN: INTRAVENOUS | Status: DC

## 2013-05-06 MED ORDER — ONDANSETRON HCL 4 MG/2ML IJ SOLN
4.0000 mg | Freq: Once | INTRAMUSCULAR | Status: AC
Start: 2013-05-06 — End: 2013-05-06
  Administered 2013-05-06: 4 mg via INTRAVENOUS
  Filled 2013-05-06: qty 2

## 2013-05-06 MED ORDER — VENLAFAXINE HCL ER 75 MG PO CP24: 75.0000 mg | ORAL_CAPSULE | Freq: Two times a day (BID) | ORAL | Status: DC

## 2013-05-06 MED ORDER — ACETAMINOPHEN 325 MG PO TABS: 650.0000 mg | ORAL_TABLET | Freq: Four times a day (QID) | ORAL | Status: DC | PRN

## 2013-05-06 MED ORDER — INSULIN ASPART 100 UNIT/ML ~~LOC~~ SOLN
0.0000 [IU] | Freq: Three times a day (TID) | SUBCUTANEOUS | Status: DC
Start: 2013-05-07 — End: 2013-05-08
  Administered 2013-05-07 – 2013-05-08 (×2): 1 [IU] via SUBCUTANEOUS

## 2013-05-06 MED ORDER — IOHEXOL 350 MG/ML SOLN
100.0000 mL | Freq: Once | INTRAVENOUS | Status: AC | PRN
  Administered 2013-05-06: 100 mL via INTRAVENOUS

## 2013-05-06 MED ORDER — SODIUM CHLORIDE 0.9 % IV SOLN
INTRAVENOUS | Status: DC
  Administered 2013-05-06: 22:00:00 via INTRAVENOUS

## 2013-05-06 MED ORDER — BUPROPION HCL ER (SR) 150 MG PO TB12
150.0000 mg | ORAL_TABLET | Freq: Two times a day (BID) | ORAL | Status: DC
  Administered 2013-05-06 – 2013-05-08 (×4): 150 mg via ORAL
  Filled 2013-05-06 (×5): qty 1

## 2013-05-06 MED ORDER — AMLODIPINE BESYLATE 10 MG PO TABS
10.0000 mg | ORAL_TABLET | Freq: Every day | ORAL | Status: DC
  Administered 2013-05-07 – 2013-05-08 (×2): 10 mg via ORAL
  Filled 2013-05-06 (×2): qty 1

## 2013-05-06 MED ORDER — POTASSIUM CHLORIDE 10 MEQ/100ML IV SOLN
10.0000 meq | INTRAVENOUS | Status: AC
  Administered 2013-05-06 – 2013-05-07 (×4): 10 meq via INTRAVENOUS
  Filled 2013-05-06 (×4): qty 100

## 2013-05-06 NOTE — H&P (Addendum)
[facility] History and Physical  Stacey Fletcher ZOX:[id number] DOB: 04/23/1950 DOA: 05/06/2013  Referring physician: ER physician. PCP: No PCP Per Patient   Chief Complaint: Nausea vomiting and chest pain.  HPI: Stacey Fletcher is a 63 y.o. female with history of diabetes mellitus, hypertension and hypothyroidism started experiencing nausea vomiting and diarrhea 3 days ago which has been persistent. Patient has some nonspecific abdominal discomfort along with her nausea vomiting and diarrhea. Today patient started developing chest pain after vomiting which was retrosternal nonradiating stabbing and pressure-like. Due to the persistent symptoms patient presented the ER. Patient was very tachycardic. Patient has not taken her home medications lastly days because of the vomiting which included atenolol. CT angiogram of the chest was negative for PE and so none of the abdomen was done which shows hepatic steatosis and hepatocellular disease but no evidence of acute cholecystitis. Patient's LFTs are mildly elevated but lipase was normal. Patient denies having traveled anywhere recently or come in contact with sick patients or use any antibiotics. Denies having taken any high risk food. Patient was given 2 L normal saline bolus in the ER. Patient has been admitted for further management. Presently patient is chest pain-free. Over the last 3 days patient also has been having running nose with fever and chills but denies any shortness of breath or productive cough.  Review of Systems: As presented in the history of presenting illness, rest negative.  Past Medical History  Diagnosis Date  . Diabetes mellitus   . Hypertension   . Coronary artery disease   . Depression   . Fibromyalgia   . MI (myocardial infarction)    Past Surgical History  Procedure Laterality Date  . Abdominal hysterectomy    . Breast surgery    . Cardiac catheterization      1995   Social History:  reports that she  has been smoking Cigarettes.  She has been smoking about 0.25 packs per day. She has never used smokeless tobacco. She reports that she does not drink alcohol or use illicit drugs. Where does patient live home. Can patient participate in ADLs? Yes.  Allergies  Allergen Reactions  . Procardia [Nifedipine] Palpitations    "heart attack symptoms"    Family History:  Family History  Problem Relation Age of Onset  . Hypertension Mother   . Diabetes Mother   . Stroke Mother   . Hypertension Other   . Diabetes Other       Prior to Admission medications   Medication Sig Start Date End Date Taking? Authorizing Provider  amLODipine (NORVASC) 10 MG tablet Take 10 mg by mouth daily. 06/03/11  Yes Lyanne Co, MD  aspirin 81 MG chewable tablet Chew 81 mg by mouth daily.   Yes Historical Provider, MD  atenolol-chlorthalidone (TENORETIC) 50-25 MG per tablet Take 1 tablet by mouth daily. 06/03/11  Yes Lyanne Co, MD  buPROPion [facility] SR) 150 MG 12 hr tablet Take 150 mg by mouth 2 (two) times daily. 06/03/11  Yes Lyanne Co, MD  levothyroxine (SYNTHROID, LEVOTHROID) 50 MCG tablet Take 50 mcg by mouth daily before breakfast.   Yes Historical Provider, MD  metFORMIN (GLUCOPHAGE) 500 MG tablet Take 1,000 mg by mouth daily with breakfast.  06/03/11  Yes Lyanne Co, MD  Multiple Vitamin (MULTIVITAMIN WITH MINERALS) TABS tablet Take 1 tablet by mouth daily.   Yes Historical Provider, MD  potassium chloride SA (K-DUR,KLOR-CON) 20 MEQ tablet Take 20 mEq by mouth 2 (two) times daily.  Yes Historical Provider, MD  venlafaxine XR (EFFEXOR-XR) 75 MG 24 hr capsule Take 75 mg by mouth 2 (two) times daily.  06/03/11  Yes Lyanne Co, MD    Physical Exam: Filed Vitals:   05/06/13 1650 05/06/13 1935 05/06/13 2123  BP: 122/85 127/54 117/61  Pulse: 131 126 128  Temp:   101.3 F (38.5 C)  TempSrc:   Oral  Resp: 16 18 18   SpO2: 94% 95% 99%     General:  Well-developed and  nourished.  Eyes: Anicteric no pallor.  ENT: No discharge from the ears eyes nose mouth.  Neck: No mass felt.  Cardiovascular: S1-S2 heard.  Respiratory: No rhonchi or crepitations.  Abdomen: Soft nontender bowel sounds present. No guarding or rigidity.  Skin: No rash.  Musculoskeletal: No edema.  Psychiatric: Appears normal.  Neurologic: Alert awake oriented to time place and person. Moves all extremities.  Labs on Admission:  Basic Metabolic Panel:  Recent Labs Lab 05/06/13 1655  NA 132*  K 2.9*  CL 94*  CO2 25  GLUCOSE 118*  BUN 18  CREATININE 0.83  CALCIUM 9.4   Liver Function Tests:  Recent Labs Lab 05/06/13 1655  AST 39*  ALT 41*  ALKPHOS 92  BILITOT 0.4  PROT 7.3  ALBUMIN 3.9    Recent Labs Lab 05/06/13 1655  LIPASE 20   No results found for this basename: AMMONIA,  in the last 168 hours CBC:  Recent Labs Lab 05/06/13 1655  WBC 9.6  NEUTROABS 8.3*  HGB 13.4  HCT 40.2  MCV 80.9  PLT 254   Cardiac Enzymes:  Recent Labs Lab 05/06/13 1655  TROPONINI <0.30    BNP (last 3 results)  Recent Labs  05/06/13 1655  PROBNP 49.5   CBG: No results found for this basename: GLUCAP,  in the last 168 hours  Radiological Exams on Admission: Ct Angio Chest Pe W/cm &/or Wo Cm  05/06/2013   CLINICAL DATA:  Chest pain, tachycardia, eval for PE  EXAM: CT ANGIOGRAPHY CHEST WITH CONTRAST  TECHNIQUE: Multidetector CT imaging of the chest was performed using the standard protocol during bolus administration of intravenous contrast. Multiplanar CT image reconstructions and MIPs were obtained to evaluate the vascular anatomy.  CONTRAST:  OMNIPAQUE IOHEXOL 350 MG/ML SOLN  COMPARISON:  None.  FINDINGS: Diffuse enlargement of the thyroid is appreciated with extension into the superior aspect of the mediastinum. Thoracic inlet is otherwise unremarkable.  No further mediastinal masses nor adenopathy.  There is suboptimal opacification of the pulmonary  arteries. There is no evidence of filling defects within the main, lobar, or segmental pulmonary arteries. No evidence of a thoracic aortic aneurysm nor dissection.  Diffuse wall thickening of the esophagus is appreciated.  Mild hypoventilation is identified within the lung bases. The lungs otherwise clear.  There is diffuse low attenuation within the liver parenchyma. The liver is only partially visualized and diffuse enlargement cannot be excluded. Remaining visualized upper abdominal viscera are unremarkable.  No aggressive appearing osseous lesions.  Review of the MIP images confirms the above findings.  IMPRESSION: 1. No CT evidence of pulmonary arterial embolic disease 2. Lungs otherwise clear 3. Hepatic steatosis 4. Thyromegaly further evaluation with nonemergent thyroid ultrasound recommended 5. Findings which may reflect sequela of esophagitis.   Electronically Signed   By: Salome Holmes M.D.   On: 05/06/2013 20:45   US Abdomen Complete  05/06/2013   CLINICAL DATA:  Abdominal pain, nausea, vomiting, diarrhea. The study is limited secondary  to patient body habitus.  EXAM: ULTRASOUND ABDOMEN COMPLETE  COMPARISON:  None.  FINDINGS: Gallbladder:  No gallstones or wall thickening visualized, measuring 2.1 mm. No sonographic Murphy sign noted.  Common bile duct:  Diameter: 4.8 mm  Liver:  Dense, echogenic, heterogeneous architecture, no focal abnormalities.  IVC:  Not visualized  Pancreas:  Not visualized  Spleen:  Size and appearance within normal limits.  Right Kidney:  Length: 11.2 cm. Limited visualization. Echogenicity within normal limits. No mass or hydronephrosis visualized.  Left Kidney:  Length: 11.6 cm. Echogenicity within normal limits. No mass or hydronephrosis visualized.  Abdominal aorta:  No aneurysm visualized.  Other findings:  None.  IMPRESSION: 1. Heterogeneous echogenic liver differential considerations hepatic steatosis, hepatocellular disease. 2. No evidence of acute cholecystitis.    Electronically Signed   By: Salome Holmes M.D.   On: 05/06/2013 19:20   Dg Chest Port 1 View  05/06/2013   CLINICAL DATA:  CHEST PAIN AND SHORTNESS OF BREATH.  EXAM: PORTABLE CHEST - 1 VIEW  COMPARISON:  DG CHEST 2 VIEW dated 06/16/2012  FINDINGS: 1717 HRS. The lungs are clear without focal infiltrate, edema, pneumothorax or pleural effusion. The cardio pericardial silhouette is enlarged. Imaged bony structures of the thorax are intact. Telemetry leads overlie the chest.  IMPRESSION: Cardiomegaly without acute cardiopulmonary process.  .   Electronically Signed   By: Kennith Center M.D.   On: 05/06/2013 17:33    EKG: Independently reviewed. Sinus tachycardia.  Assessment/Plan Principal Problem:   Nausea vomiting and diarrhea Active Problems:   Chest pain   Fever   HTN (hypertension)   Hypothyroidism   Diabetes mellitus   1. Nausea vomiting and diarrhea with fever - most likely secondary to viral syndrome. At this time I have ordered influenza PCR and since patient also has mildly elevated LFTs acute hepatitis panel has been ordered. Check stool studies including cultures and C. difficile. Continue with hydration. 2. Chest pain - probably related to nausea vomiting. Cycle cardiac markers. Aspirin. 3. Sinus tachycardia - probably related to dehydration/fever and patient not taking her beta blocker. I have ordered one dose of atenolol home dose. Patient at this time is able to keep in food. 4. Hyponatremia and hypokalemia - probably secondary to dehydration. Replace and recheck. 5. Hypertension - continue home medications except for diuretics. 6. Diabetes mellitus - patient has been placed on sliding-scale coverage. 7. Hypothyroidism - CT angiogram of the chest shows thyromegaly for which sonogram has been ordered. Check TSH. 8. Abnormal sonogram of the abdomen - patient's sonogram of the abdomen is read as hepatocellular disease which will need outpatient followup with primary care for further  workup. For now I'm checking hepatitis panel.    Code Status: Full code.  Family Communication: None.  Disposition Plan: Admit to inpatient.    Kieron Kantner N. [facility] Pager [phone].  If 7PM-7AM, please contact night-coverage [email or web address] Password [facility] 05/06/2013, 10:27 PM

## 2013-05-06 NOTE — ED Notes (Signed)
Pt presents with NAD- N/V/D 10 days ago. Generalized weakness started today. CP started last night. Pain sharp variable pain lasting bouts. Resting or lying down felt better. Increased movement causes symptoms to get worse. EDP Pollina present to evaluate this pt

## 2013-05-06 NOTE — ED Notes (Signed)
Pt's son, Sharlet Salina:  Tel# [phone] Please update above family on pt's admission and plan of care.

## 2013-05-06 NOTE — ED Notes (Signed)
Hospitalist, Dr. Kakrakandy, at bedside. 

## 2013-05-06 NOTE — Progress Notes (Signed)
   CARE MANAGEMENT ED NOTE 05/06/2013  Patient:  [facility]   Account Number:  [id number]  Date Initiated:  05/06/2013  Documentation initiated by:  Radford Pax  Subjective/Objective Assessment:   Patient presents to Ed with n/v dizzyness and weakness     Subjective/Objective Assessment Detail:     Action/Plan:   Action/Plan Detail:   Anticipated DC Date:       Status Recommendation to Physician:   Result of Recommendation:    Other ED Services  Consult Working Plan    DC Planning Services  Other  PCP issues    Choice offered to / List presented to:            Status of service:  Completed, signed off  ED Comments:   ED Comments Detail:  EDCM spoke to patient at bedside.  Patient confirms she does not have a pcp or insurance living in [place]. [facility] provided patient with a list of pcps who accept self pay patients, phone number and address of [facility], phone number to inquire about the orange card, phone number to inquire about Medicaid insurance,  list of discounted pharmacies and websites needymeds.org and GoodRX.com for medication assistance, financial resources in the community such as local churches and [employer], and dental assistance for patients without insurance.  EDCM aslo provided patient with RX discount card.  Patient thankful for resources.  No further EDCM needs at this time.

## 2013-05-06 NOTE — ED Notes (Signed)
MD at bedside. 

## 2013-05-06 NOTE — ED Provider Notes (Signed)
CSN: [id number]     Arrival date & time 05/06/13  1639 History   First MD Initiated Contact with Patient 05/06/13 1652     Chief Complaint  Patient presents with  . Chest Pain  . Dizziness  . Emesis  . Diarrhea  . Weakness     (Consider location/radiation/quality/duration/timing/severity/associated sxs/prior Treatment) HPI Comments: Patient presents to the ER with multiple complaints. Patient started having nausea approximately 3 weeks ago. She had persistent nausea and could not eat much because of it. One half weeks ago, however she started having vomiting with the nausea. She has had persistent and worsening nausea and vomiting since then. She reports dizziness and weakness. The symptoms worsen when she gets up and moves around. Stools have been loose, but she does not think it is truly diarrhea.  Last night she had onset of chest pain. Pain is sharp and in the left side of her chest. She reports it was persistent for several hours last night and then resolve. Today it has been coming and going. Currently she is not experiencing pain.   Patient is a 63 y.o. female presenting with chest pain, dizziness, vomiting, diarrhea, and weakness.  Chest Pain Associated symptoms: dizziness, nausea, vomiting and weakness   Dizziness Associated symptoms: chest pain, diarrhea, nausea and vomiting   Emesis Associated symptoms: diarrhea   Diarrhea Associated symptoms: vomiting   Weakness Associated symptoms include chest pain.    Past Medical History  Diagnosis Date  . Diabetes mellitus   . Hypertension   . Coronary artery disease   . Depression   . Fibromyalgia   . MI (myocardial infarction)    Past Surgical History  Procedure Laterality Date  . Abdominal hysterectomy    . Breast surgery    . Cardiac catheterization      1995   Family History  Problem Relation Age of Onset  . Hypertension Mother   . Diabetes Mother   . Stroke Mother   . Hypertension Other   . Diabetes Other     History  Substance Use Topics  . Smoking status: Current Every Day Smoker -- 0.25 packs/day    Types: Cigarettes  . Smokeless tobacco: Never Used  . Alcohol Use: No   OB History   Grav Para Term Preterm Abortions TAB SAB Ect Mult Living                 Review of Systems  Cardiovascular: Positive for chest pain.  Gastrointestinal: Positive for nausea, vomiting and diarrhea.  Neurological: Positive for dizziness and weakness.      Allergies  Procardia  Home Medications   Current Outpatient Rx  Name  Route  Sig  Dispense  Refill  . amLODipine (NORVASC) 10 MG tablet   Oral   Take 10 mg by mouth daily.         Marland Kitchen aspirin 81 MG chewable tablet   Oral   Chew 81 mg by mouth daily.         Marland Kitchen atenolol-chlorthalidone (TENORETIC) 50-25 MG per tablet   Oral   Take 1 tablet by mouth daily.         Marland Kitchen buPROPion (WELLBUTRIN SR) 150 MG 12 hr tablet   Oral   Take 150 mg by mouth 2 (two) times daily.         Marland Kitchen levothyroxine (SYNTHROID, LEVOTHROID) 50 MCG tablet   Oral   Take 50 mcg by mouth daily before breakfast.         .  metFORMIN (GLUCOPHAGE) 500 MG tablet   Oral   Take 1,000 mg by mouth daily with breakfast.          . Multiple Vitamin (MULTIVITAMIN WITH MINERALS) TABS tablet   Oral   Take 1 tablet by mouth daily.         . potassium chloride SA (K-DUR,KLOR-CON) 20 MEQ tablet   Oral   Take 20 mEq by mouth 2 (two) times daily.         Marland Kitchen venlafaxine XR (EFFEXOR-XR) 75 MG 24 hr capsule   Oral   Take 75 mg by mouth 2 (two) times daily.           BP 117/61  Pulse 128  Temp(Src) 101.3 F (38.5 C) (Oral)  Resp 18  SpO2 99% Physical Exam  Constitutional: She is oriented to person, place, and time. She appears well-developed and well-nourished. No distress.  HENT:  Head: Normocephalic and atraumatic.  Right Ear: Hearing normal.  Left Ear: Hearing normal.  Nose: Nose normal.  Mouth/Throat: Oropharynx is clear and moist and mucous membranes are  normal.  Eyes: Conjunctivae and EOM are normal. Pupils are equal, round, and reactive to light.  Neck: Normal range of motion. Neck supple.  Cardiovascular: Regular rhythm, S1 normal and S2 normal.  Tachycardia present.  Exam reveals no gallop and no friction rub.   No murmur heard. Pulmonary/Chest: Effort normal and breath sounds normal. No respiratory distress. She exhibits no tenderness.  Abdominal: Soft. Normal appearance and bowel sounds are normal. There is no hepatosplenomegaly. There is tenderness in the epigastric area. There is no rebound, no guarding, no tenderness at McBurney's point and negative Murphy's sign. No hernia.  Musculoskeletal: Normal range of motion.  Neurological: She is alert and oriented to person, place, and time. She has normal strength. No cranial nerve deficit or sensory deficit. Coordination normal. GCS eye subscore is 4. GCS verbal subscore is 5. GCS motor subscore is 6.  Skin: Skin is warm, dry and intact. No rash noted. No cyanosis.  Psychiatric: She has a normal mood and affect. Her speech is normal and behavior is normal. Thought content normal.    ED Course  Procedures (including critical care time) Labs Review Labs Reviewed  CBC WITH DIFFERENTIAL - Abnormal; Notable for the following:    Neutrophils Relative % 86 (*)    Neutro Abs 8.3 (*)    Lymphocytes Relative 11 (*)    All other components within normal limits  COMPREHENSIVE METABOLIC PANEL - Abnormal; Notable for the following:    Sodium 132 (*)    Potassium 2.9 (*)    Chloride 94 (*)    Glucose, Bld 118 (*)    AST 39 (*)    ALT 41 (*)    GFR calc non Af Amer 73 (*)    GFR calc Af Amer 85 (*)    All other components within normal limits  CULTURE, BLOOD (ROUTINE X 2)  CULTURE, BLOOD (ROUTINE X 2)  PRO B NATRIURETIC PEPTIDE  TROPONIN I  URINALYSIS, ROUTINE W REFLEX MICROSCOPIC  LIPASE, BLOOD  I-STAT CG4 LACTIC ACID, ED   Imaging Review Ct Angio Chest Pe W/cm &/or Wo Cm  05/06/2013    CLINICAL DATA:  Chest pain, tachycardia, eval for PE  EXAM: CT ANGIOGRAPHY CHEST WITH CONTRAST  TECHNIQUE: Multidetector CT imaging of the chest was performed using the standard protocol during bolus administration of intravenous contrast. Multiplanar CT image reconstructions and MIPs were obtained to evaluate the vascular anatomy.  CONTRAST:  OMNIPAQUE IOHEXOL 350 MG/ML SOLN  COMPARISON:  None.  FINDINGS: Diffuse enlargement of the thyroid is appreciated with extension into the superior aspect of the mediastinum. Thoracic inlet is otherwise unremarkable.  No further mediastinal masses nor adenopathy.  There is suboptimal opacification of the pulmonary arteries. There is no evidence of filling defects within the main, lobar, or segmental pulmonary arteries. No evidence of a thoracic aortic aneurysm nor dissection.  Diffuse wall thickening of the esophagus is appreciated.  Mild hypoventilation is identified within the lung bases. The lungs otherwise clear.  There is diffuse low attenuation within the liver parenchyma. The liver is only partially visualized and diffuse enlargement cannot be excluded. Remaining visualized upper abdominal viscera are unremarkable.  No aggressive appearing osseous lesions.  Review of the MIP images confirms the above findings.  IMPRESSION: 1. No CT evidence of pulmonary arterial embolic disease 2. Lungs otherwise clear 3. Hepatic steatosis 4. Thyromegaly further evaluation with nonemergent thyroid ultrasound recommended 5. Findings which may reflect sequela of esophagitis.   Electronically Signed   By: Salome Holmes M.D.   On: 05/06/2013 20:45   US Abdomen Complete  05/06/2013   CLINICAL DATA:  Abdominal pain, nausea, vomiting, diarrhea. The study is limited secondary to patient body habitus.  EXAM: ULTRASOUND ABDOMEN COMPLETE  COMPARISON:  None.  FINDINGS: Gallbladder:  No gallstones or wall thickening visualized, measuring 2.1 mm. No sonographic Murphy sign noted.  Common bile  duct:  Diameter: 4.8 mm  Liver:  Dense, echogenic, heterogeneous architecture, no focal abnormalities.  IVC:  Not visualized  Pancreas:  Not visualized  Spleen:  Size and appearance within normal limits.  Right Kidney:  Length: 11.2 cm. Limited visualization. Echogenicity within normal limits. No mass or hydronephrosis visualized.  Left Kidney:  Length: 11.6 cm. Echogenicity within normal limits. No mass or hydronephrosis visualized.  Abdominal aorta:  No aneurysm visualized.  Other findings:  None.  IMPRESSION: 1. Heterogeneous echogenic liver differential considerations hepatic steatosis, hepatocellular disease. 2. No evidence of acute cholecystitis.   Electronically Signed   By: Salome Holmes M.D.   On: 05/06/2013 19:20   Dg Chest Port 1 View  05/06/2013   CLINICAL DATA:  CHEST PAIN AND SHORTNESS OF BREATH.  EXAM: PORTABLE CHEST - 1 VIEW  COMPARISON:  DG CHEST 2 VIEW dated 06/16/2012  FINDINGS: 1717 HRS. The lungs are clear without focal infiltrate, edema, pneumothorax or pleural effusion. The cardio pericardial silhouette is enlarged. Imaged bony structures of the thorax are intact. Telemetry leads overlie the chest.  IMPRESSION: Cardiomegaly without acute cardiopulmonary process.  .   Electronically Signed   By: Kennith Center M.D.   On: 05/06/2013 17:33     EKG Interpretation   Date/Time:  Wednesday May 06 2013 16:47:35 EDT Ventricular Rate:  129 PR Interval:  116 QRS Duration: 98 QT Interval:  429 QTC Calculation: 629 R Axis:   55 Text Interpretation:  Sinus tachycardia Minimal ST depression, diffuse  leads Prolonged QT interval No significant change since last tracing  Confirmed by POLLINA  MD, CHRISTOPHER [phone]) on 05/06/2013 4:55:02 PM      MDM   Final diagnoses:  Chest pain  Tachycardia  Febrile illness  Vomiting  Abdominal pain    Patient presents to the ER with persistent nausea, vomiting, abdominal pain. Symptoms began more than a week ago. Prior to that she had problems  with persistent nausea for more than a week. She has had very little oral intake over the past  3 weeks because of the symptoms. Patient was tachycardic on arrival, felt likely to be secondary to dehydration. Ultimately, however, it was discovered that she was febrile as well.  Patient's abdominal pain is entirely epigastric. No pain below the umbilicus. No tenderness to palpation in the lower quadrants. She did not have any significant quadrant tenderness or Murphy sign. Ultrasound was, however, performed to further evaluate. No evidence of acute gallbladder disease.  Patient complaining of chest pain. This began last night. She does have a history of MI. She reports that this pain is different. EKG did not show any evidence of ischemia or infarct arrival. Troponin was negative. BNP was negative. Chest x-ray was clear, no congestive heart failure and no evidence of pneumonia.  Patient had CT angiography of chest to rule out PE because of chest pain and persistent tachycardia. It was negative for PE. This was also performed to further evaluate for the possibility of occult pneumonia not seen on x-ray. No pneumonia seen.  Patient was found to be febrile, temp 101.1. Source of the fever is unclear. Urinalysis was clear. Blood and urine cultures are pending. Tachycardia is likely at least in part secondary to the fever, but also secondary to her poor by mouth intake and vomiting.  Patient will be admitted to the hospital for serial cardiac enzymes, IV hydration and further management.     Gilda Creasehristopher J. Pollina, MD 05/06/13 2212

## 2013-05-07 ENCOUNTER — Inpatient Hospital Stay (HOSPITAL_COMMUNITY): Payer: Self-pay

## 2013-05-07 LAB — COMPREHENSIVE METABOLIC PANEL
ALT: 41 U/L — ABNORMAL HIGH (ref 0–35)
AST: 43 U/L — ABNORMAL HIGH (ref 0–37)
Albumin: 3.2 g/dL — ABNORMAL LOW (ref 3.5–5.2)
Alkaline Phosphatase: 75 U/L (ref 39–117)
BUN: 15 mg/dL (ref 6–23)
CO2: 26 mEq/L (ref 19–32)
Calcium: 8.4 mg/dL (ref 8.4–10.5)
Chloride: 103 mEq/L (ref 96–112)
Creatinine, Ser: 0.98 mg/dL (ref 0.50–1.10)
GFR calc Af Amer: 69 mL/min — ABNORMAL LOW (ref >90)
GFR calc non Af Amer: 60 mL/min — ABNORMAL LOW (ref >90)
Glucose, Bld: 110 mg/dL — ABNORMAL HIGH (ref 70–99)
Potassium: 3.1 mEq/L — ABNORMAL LOW (ref 3.7–5.3)
Sodium: 141 mEq/L (ref 137–147)
Total Bilirubin: 0.4 mg/dL (ref 0.3–1.2)
Total Protein: 6.2 g/dL (ref 6.0–8.3)

## 2013-05-07 LAB — CBC WITH DIFFERENTIAL/PLATELET
Basophils Absolute: 0 10*3/uL (ref 0.0–0.1)
Basophils Relative: 0 % (ref 0–1)
Eosinophils Absolute: 0 10*3/uL (ref 0.0–0.7)
Eosinophils Relative: 0 % (ref 0–5)
HCT: 36.1 % (ref 36.0–46.0)
Hemoglobin: 11.8 g/dL — ABNORMAL LOW (ref 12.0–15.0)
Lymphocytes Relative: 30 % (ref 12–46)
Lymphs Abs: 2.5 10*3/uL (ref 0.7–4.0)
MCH: 26.9 pg (ref 26.0–34.0)
MCHC: 32.7 g/dL (ref 30.0–36.0)
MCV: 82.2 fL (ref 78.0–100.0)
Monocytes Absolute: 0.9 10*3/uL (ref 0.1–1.0)
Monocytes Relative: 11 % (ref 3–12)
Neutro Abs: 4.9 10*3/uL (ref 1.7–7.7)
Neutrophils Relative %: 59 % (ref 43–77)
Platelets: 218 10*3/uL (ref 150–400)
RBC: 4.39 MIL/uL (ref 3.87–5.11)
RDW: 15.2 % (ref 11.5–15.5)
WBC: 8.4 10*3/uL (ref 4.0–10.5)

## 2013-05-07 LAB — HEPATITIS PANEL, ACUTE
HCV Ab: NEGATIVE
Hep A IgM: NONREACTIVE
Hep B C IgM: NONREACTIVE
Hepatitis B Surface Ag: NEGATIVE

## 2013-05-07 LAB — TSH: TSH: 4.86 u[IU]/mL — ABNORMAL HIGH (ref 0.350–4.500)

## 2013-05-07 LAB — INFLUENZA PANEL BY PCR (TYPE A & B)
H1N1 flu by pcr: NOT DETECTED
Influenza A By PCR: NEGATIVE
Influenza B By PCR: NEGATIVE

## 2013-05-07 LAB — GLUCOSE, CAPILLARY
Glucose-Capillary: 116 mg/dL — ABNORMAL HIGH (ref 70–99)
Glucose-Capillary: 101 mg/dL — ABNORMAL HIGH (ref 70–99)
Glucose-Capillary: 122 mg/dL — ABNORMAL HIGH (ref 70–99)
Glucose-Capillary: 99 mg/dL (ref 70–99)

## 2013-05-07 LAB — TROPONIN I
Troponin I: <0.3 ng/mL (ref <0.30)
Troponin I: <0.3 ng/mL (ref <0.30)
Troponin I: <0.3 ng/mL (ref <0.30)

## 2013-05-07 MED ORDER — LABETALOL HCL 5 MG/ML IV SOLN
5.0000 mg | Freq: Once | INTRAVENOUS | Status: DC
  Filled 2013-05-07: qty 4

## 2013-05-07 MED ORDER — LORATADINE 10 MG PO TABS
10.0000 mg | ORAL_TABLET | Freq: Every day | ORAL | Status: DC
  Administered 2013-05-07 – 2013-05-08 (×2): 10 mg via ORAL
  Filled 2013-05-07 (×2): qty 1

## 2013-05-07 NOTE — Progress Notes (Signed)
Pt admitted from ED. VSS. No distress noted at this time. Family updated. Will continue to monitor pt closely. Stacey Fletcher

## 2013-05-07 NOTE — Progress Notes (Signed)
[facility] PROGRESS NOTE  Stacey Fletcher WUJ:[id number] DOB: 09/21/1950 DOA: 05/06/2013 PCP: No PCP Per Patient  Assessment/Plan:  Nausea vomiting and diarrhea with fever  - Most likely secondary to viral syndrome - Diarrhea resolving. Nausea and vomiting resolved - Influenza PCR - negative - Mildly elevated LFTs acute hepatitis panel- negative - Stool studies including cultures- neg thus far - C. Difficile- pending - Continue MIVF.  Chest pain  - Resolved - Probably related to nausea vomiting.  - Troponin neg x 3.  - Aspirin.  Sinus tachycardia  - Probably related to dehydration/fever and patient not taking her beta blocker. - Started on beta blocker, resolving  Hyponatremia and hypokalemia  - Probably secondary to dehydration.  - Replaced  - Hyponatremia resolved - Recheck BMP in AM  Hypertension  - Relatively well controlled - Continue amlodipine, atenolol - Hold diuretics   Diabetes mellitus  - SSI.  Hypothyroidism  - CT angiogram of the chest shows thyromegaly  - US soft tissue neck-results pending  - TSH 4.860, pt reports not being able to take medication for a few days related to N/V - Continue home synthroid dose  Abnormal sonogram of the abdomen  - Patient's sonogram of the abdomen is read as hepatocellular disease which will need outpatient followup with primary care for further workup.  - Hepatitis panel.  DVT prevention: Lovenox  Code Status: Full Family Communication: None at bedside. Discussed with pt Disposition Plan: Pending continued improvement   Consultants:  None  Procedures:  CT abdomen  US soft tissue neck  US abdomen  Antibiotics:  None  HPI/Subjective: Pt reports resolved nausea and vomiting. Diarrhea improving. No new complaints or events over night.  Objective: Filed Vitals:   05/07/13 0500  BP: 129/70  Pulse: 99  Temp: 97.9 F (36.6 C)  Resp: 22    Intake/Output Summary (Last 24 hours) at 05/07/13  1408 Last data filed at 05/07/13 1300  Gross per 24 hour  Intake 2010.83 ml  Output   1901 ml  Net 109.83 ml   Filed Weights   05/06/13 2251  Weight: 114.7 kg (252 lb 13.9 oz)    Exam:   General:  Pt in NAD, alert and awake  Cardiovascular: RRR, no MRG  Respiratory: CTA BL, no wheezes  Abdomen: soft, NT, ND. obese  Musculoskeletal: no cyanosis or clubbing   Data Reviewed: Basic Metabolic Panel:  Recent Labs Lab 05/06/13 1655 05/06/13 1707 05/07/13 0440  NA 132*  --  141  K 2.9*  --  3.1*  CL 94*  --  103  CO2 25  --  26  GLUCOSE 118*  --  110*  BUN 18  --  15  CREATININE 0.83  --  0.98  CALCIUM 9.4  --  8.4  MG  --  1.7  --    Liver Function Tests:  Recent Labs Lab 05/06/13 1655 05/07/13 0440  AST 39* 43*  ALT 41* 41*  ALKPHOS 92 75  BILITOT 0.4 0.4  PROT 7.3 6.2  ALBUMIN 3.9 3.2*    Recent Labs Lab 05/06/13 1655  LIPASE 20   No results found for this basename: AMMONIA,  in the last 168 hours CBC:  Recent Labs Lab 05/06/13 1655 05/07/13 0440  WBC 9.6 8.4  NEUTROABS 8.3* 4.9  HGB 13.4 11.8*  HCT 40.2 36.1  MCV 80.9 82.2  PLT 254 218   Cardiac Enzymes:  Recent Labs Lab 05/06/13 1655 05/07/13 0440 05/07/13 1050  TROPONINI <0.30 <0.30 <0.30  BNP (last 3 results)  Recent Labs  05/06/13 1655  PROBNP 49.5   CBG:  Recent Labs Lab 05/07/13 0744 05/07/13 1154  GLUCAP 116* 101*    No results found for this or any previous visit (from the past 240 hour(s)).   Studies: Ct Angio Chest Pe W/cm &/or Wo Cm  05/06/2013   CLINICAL DATA:  Chest pain, tachycardia, eval for PE  EXAM: CT ANGIOGRAPHY CHEST WITH CONTRAST  TECHNIQUE: Multidetector CT imaging of the chest was performed using the standard protocol during bolus administration of intravenous contrast. Multiplanar CT image reconstructions and MIPs were obtained to evaluate the vascular anatomy.  CONTRAST:  OMNIPAQUE IOHEXOL 350 MG/ML SOLN  COMPARISON:  None.  FINDINGS:  Diffuse enlargement of the thyroid is appreciated with extension into the superior aspect of the mediastinum. Thoracic inlet is otherwise unremarkable.  No further mediastinal masses nor adenopathy.  There is suboptimal opacification of the pulmonary arteries. There is no evidence of filling defects within the main, lobar, or segmental pulmonary arteries. No evidence of a thoracic aortic aneurysm nor dissection.  Diffuse wall thickening of the esophagus is appreciated.  Mild hypoventilation is identified within the lung bases. The lungs otherwise clear.  There is diffuse low attenuation within the liver parenchyma. The liver is only partially visualized and diffuse enlargement cannot be excluded. Remaining visualized upper abdominal viscera are unremarkable.  No aggressive appearing osseous lesions.  Review of the MIP images confirms the above findings.  IMPRESSION: 1. No CT evidence of pulmonary arterial embolic disease 2. Lungs otherwise clear 3. Hepatic steatosis 4. Thyromegaly further evaluation with nonemergent thyroid ultrasound recommended 5. Findings which may reflect sequela of esophagitis.   Electronically Signed   By: Salome Holmes M.D.   On: 05/06/2013 20:45   US Abdomen Complete  05/06/2013   CLINICAL DATA:  Abdominal pain, nausea, vomiting, diarrhea. The study is limited secondary to patient body habitus.  EXAM: ULTRASOUND ABDOMEN COMPLETE  COMPARISON:  None.  FINDINGS: Gallbladder:  No gallstones or wall thickening visualized, measuring 2.1 mm. No sonographic Murphy sign noted.  Common bile duct:  Diameter: 4.8 mm  Liver:  Dense, echogenic, heterogeneous architecture, no focal abnormalities.  IVC:  Not visualized  Pancreas:  Not visualized  Spleen:  Size and appearance within normal limits.  Right Kidney:  Length: 11.2 cm. Limited visualization. Echogenicity within normal limits. No mass or hydronephrosis visualized.  Left Kidney:  Length: 11.6 cm. Echogenicity within normal limits. No mass or  hydronephrosis visualized.  Abdominal aorta:  No aneurysm visualized.  Other findings:  None.  IMPRESSION: 1. Heterogeneous echogenic liver differential considerations hepatic steatosis, hepatocellular disease. 2. No evidence of acute cholecystitis.   Electronically Signed   By: Salome Holmes M.D.   On: 05/06/2013 19:20   Dg Chest Port 1 View  05/06/2013   CLINICAL DATA:  CHEST PAIN AND SHORTNESS OF BREATH.  EXAM: PORTABLE CHEST - 1 VIEW  COMPARISON:  DG CHEST 2 VIEW dated 06/16/2012  FINDINGS: 1717 HRS. The lungs are clear without focal infiltrate, edema, pneumothorax or pleural effusion. The cardio pericardial silhouette is enlarged. Imaged bony structures of the thorax are intact. Telemetry leads overlie the chest.  IMPRESSION: Cardiomegaly without acute cardiopulmonary process.  .   Electronically Signed   By: Kennith Center M.D.   On: 05/06/2013 17:33    Scheduled Meds: . amLODipine  10 mg Oral Daily  . aspirin EC  325 mg Oral Daily  . atenolol  50 mg Oral Daily  .  buPROPion  150 mg Oral BID  . enoxaparin (LOVENOX) injection  40 mg Subcutaneous QHS  . insulin aspart  0-9 Units Subcutaneous TID WC  . levothyroxine  50 mcg Oral QAC breakfast  . loratadine  10 mg Oral Daily  . potassium chloride SA  20 mEq Oral BID  . sodium chloride  3 mL Intravenous Q12H   Continuous Infusions: . sodium chloride 125 mL/hr at 05/07/13 1232    Principal Problem:   Nausea vomiting and diarrhea Active Problems:   Chest pain   Fever   HTN (hypertension)   Hypothyroidism   Diabetes mellitus    Time spent: > 35 min    Penny Pia  [facility] Pager [phone]. If 7PM-7AM, please contact night-coverage at [email or web address], password [email or web address] 05/07/2013, 2:08 PM  LOS: 1 day

## 2013-05-07 NOTE — Care Management Note (Signed)
    Page 1 of 1   05/07/2013     1:11:56 PM   CARE MANAGEMENT NOTE 05/07/2013  Patient:  [facility]   Account Number:  [id number]  Date Initiated:  05/07/2013  Documentation initiated by:  Lanier Clam  Subjective/Objective Assessment:   63 Y/O F ADMITTED W/N/V CHEST PAIN.HX:DM.     Action/Plan:   FROM HOME.NO PCP.   Anticipated DC Date:  05/11/2013   Anticipated DC Plan:  HOME/SELF CARE  In-house referral  Financial Counselor      DC Planning Services  CM consult  [facility]      Choice offered to / List presented to:             Status of service:  In process, will continue to follow Medicare Important Message given?   (If response is "NO", the following Medicare IM given date fields will be blank) Date Medicare IM given:   Date Additional Medicare IM given:    Discharge Disposition:    Per UR Regulation:  Reviewed for med. necessity/level of care/duration of stay  If discussed at Long Length of Stay Meetings, dates discussed:    Comments:  05/07/13 Abdulkarim Eberlin RN,BSN NCM [phone] ED CM-SEE NOTE-ALREADY PROVIDED PATIENT W/PCP/INSURANCE WEBSITE,$4[facility] MED LIST.NO ANTICIPATED D/C NEEDS.

## 2013-05-07 NOTE — Progress Notes (Signed)
Nutrition Brief Note  Patient identified on the Malnutrition Screening Tool (MST) Report  Wt Readings from Last 15 Encounters:  05/06/13 252 lb 13.9 oz (114.7 kg)  10/20/11 259 lb 3.2 oz (117.572 kg)  03/06/11 250 lb (113.399 kg)    Body mass index is 36.28 kg/(m^2). Patient meets criteria for Obesity II based on current BMI.   Current diet order is Heart healthy/Carb Mod, patient is consuming approximately 50% of meals at this time. Labs and medications reviewed.   Pt reported unintentional wt loss of 20 lbs in past several months. Pt attributes this to illnesses brought into home from family and relatives. Has felt very weak and overall slightly  decreased intake. PO has significantly decreased in past 2 days d/t frequent episodes of nausea/vomiting/diarrhea. Was consuming largely liquid diet. Diarrhea and nausea improving during admit, able to to tolerate solids.   Assisted pt in ordering lunch tray, explained rationale of heart healthy and carbohydrate restrictions of diet  No nutrition interventions warranted at this time. If nutrition issues arise, please consult RD.   Lloyd Huger MS RD LDN Clinical Dietitian [phone]

## 2013-05-08 LAB — BASIC METABOLIC PANEL
BUN: 12 mg/dL (ref 6–23)
CO2: 30 mEq/L (ref 19–32)
Calcium: 8.7 mg/dL (ref 8.4–10.5)
Chloride: 106 mEq/L (ref 96–112)
Creatinine, Ser: 0.9 mg/dL (ref 0.50–1.10)
GFR calc Af Amer: 77 mL/min — ABNORMAL LOW (ref >90)
GFR calc non Af Amer: 66 mL/min — ABNORMAL LOW (ref >90)
Glucose, Bld: 105 mg/dL — ABNORMAL HIGH (ref 70–99)
Potassium: 3.6 mEq/L — ABNORMAL LOW (ref 3.7–5.3)
Sodium: 143 mEq/L (ref 137–147)

## 2013-05-08 LAB — CLOSTRIDIUM DIFFICILE BY PCR: Toxigenic C. Difficile by PCR: NEGATIVE

## 2013-05-08 LAB — GLUCOSE, CAPILLARY
Glucose-Capillary: 123 mg/dL — ABNORMAL HIGH (ref 70–99)
Glucose-Capillary: 84 mg/dL (ref 70–99)

## 2013-05-08 MED ORDER — ATENOLOL 50 MG PO TABS: 50.0000 mg | ORAL_TABLET | Freq: Every day | ORAL | Status: DC

## 2013-05-08 NOTE — Progress Notes (Signed)
Discharge instructions explained, prescription given. Stable for discharge.

## 2013-05-08 NOTE — Discharge Summary (Signed)
Physician Discharge Summary  Kierra Loh ZOX:[id number] DOB: 04/08/1950 DOA: 05/06/2013  PCP: No PCP Per Patient  Admit date: 05/06/2013 Discharge date: 05/08/2013  Time spent: > 35 minutes  Recommendations for Outpatient Follow-up:  1. Please be sure to follow up with the General surgeon for FNA of multinodular goiter. 2. Also assess K levels 3. Given soft blood pressures will place on atenolol and discontinued patient's HCTZ  Discharge Diagnoses:  Principal Problem:   Nausea vomiting and diarrhea Active Problems:   Chest pain   Fever   HTN (hypertension)   Hypothyroidism   Diabetes mellitus   Discharge Condition: stable  Diet recommendation: carb modified diet.  Filed Weights   05/06/13 2251  Weight: 114.7 kg (252 lb 13.9 oz)    History of present illness:  Pt is a 63 y/o female with h/o DM, HTN, and hypothyroidism. She presented to the hospital complaining of nausea, vomiting, diarrhea, and chest discomfort.  Hospital Course:   Nausea vomiting and diarrhea with fever  - Most likely secondary to viral syndrome  - Diarrhea resolving. Nausea and vomiting resolved  - Influenza PCR - negative  - Mildly elevated LFTs acute hepatitis panel- negative  - Stool studies including cultures- neg thus far  - C. Difficile- Negative   Chest pain  - Resolved  - Probably related to nausea vomiting.  - Troponin neg x 3.  - Aspirin.  - CT angiogram reported no CT evidence of PE, with lungs otherwise clear.  Sinus tachycardia  - Probably related to dehydration/fever and patient not taking her beta blocker.  - Started on beta blocker and improved oral intake, resolved  Hyponatremia and hypokalemia  - Probably secondary to dehydration and poor oral solute intake.  - Replaced  - Hyponatremia resolved   Hypertension  - Relatively well controlled  - Continue amlodipine, atenolol   Diabetes mellitus  - Will recommend patient continue her Metformin on discharge - continue  diabetic diet.  Hypothyroidism  - CT angiogram of the chest shows thyromegaly  - US soft tissue neck reporting: Multinodular goiter. Multiple nodules meet criteria for fine needle  aspiration biopsy if not previously assessed. This recommendation  follows the consensus statement: Management of Thyroid Nodules  Detected at [place]: Society of Radiologists in Ultrasound Consensus  Conference Statement. - Placed order for f/u with general surgery once discharged for FNA of nodules. - TSH 4.860, pt reports not being able to take medication for a few days related to N/V  - Continue home synthroid dose   Abnormal sonogram of the abdomen  - Patient's sonogram of the abdomen is read as hepatocellular disease which will need outpatient followup with primary care for further workup.  - Hepatitis panel WNL  DVT prevention: Lovenox   Procedures:  As listed below  Consultations:  None  Discharge Exam: Filed Vitals:   05/08/13 0446  BP: 128/76  Pulse: 90  Temp: 98.3 F (36.8 C)  Resp: 18    General: Pt in NAD, alert and awake Cardiovascular: RRR, no MRG Respiratory: CTA BL, no wheezes  Discharge Instructions You were cared for by a hospitalist during your hospital stay. If you have any questions about your discharge medications or the care you received while you were in the hospital after you are discharged, you can call the unit and asked to speak with the hospitalist on call if the hospitalist that took care of you is not available. Once you are discharged, your primary care physician will handle any further medical  issues. Please note that NO REFILLS for any discharge medications will be authorized once you are discharged, as it is imperative that you return to your primary care physician (or establish a relationship with a primary care physician if you do not have one) for your aftercare needs so that they can reassess your need for medications and monitor your lab values.  Discharge  Orders   Future Orders Complete By Expires   Call MD for:  persistant nausea and vomiting  As directed    Call MD for:  severe uncontrolled pain  As directed    Diet - low sodium heart healthy  As directed    Discharge instructions  As directed    Comments:     Follow up with PCP within 2 weeks of discharge for re-evaluation  Follow up with general surgery within 2 weeks for further thyroid work up. Ultrasound of thyroid showed: Multinodular goiter. Multiple nodules meet criteria for fine needle aspiration biopsy if not previously assessed. This recommendation follows the consensus statement: Management of Thyroid Nodules Detected at [place]: Society of Radiologists in Ultrasound Consensus Conference Statement. Radiology 2005; [id number].   Increase activity slowly  As directed        Medication List    STOP taking these medications       atenolol-chlorthalidone 50-25 MG per tablet  Commonly known as:  TENORETIC     potassium chloride SA 20 MEQ tablet  Commonly known as:  K-DUR,KLOR-CON      TAKE these medications       amLODipine 10 MG tablet  Commonly known as:  NORVASC  Take 10 mg by mouth daily.     aspirin 81 MG chewable tablet  Chew 81 mg by mouth daily.     atenolol 50 MG tablet  Commonly known as:  TENORMIN  Take 1 tablet (50 mg total) by mouth daily.     buPROPion 150 MG 12 hr tablet  Commonly known as:  WELLBUTRIN SR  Take 150 mg by mouth 2 (two) times daily.     levothyroxine 50 MCG tablet  Commonly known as:  SYNTHROID, LEVOTHROID  Take 50 mcg by mouth daily before breakfast.     metFORMIN 500 MG tablet  Commonly known as:  GLUCOPHAGE  Take 1,000 mg by mouth daily with breakfast.     multivitamin with minerals Tabs tablet  Take 1 tablet by mouth daily.     venlafaxine XR 75 MG 24 hr capsule  Commonly known as:  EFFEXOR-XR  Take 75 mg by mouth 2 (two) times daily.       Allergies  Allergen Reactions  . Procardia [Nifedipine] Palpitations     "heart attack symptoms"      The results of significant diagnostics from this hospitalization (including imaging, microbiology, ancillary and laboratory) are listed below for reference.    Significant Diagnostic Studies: Ct Angio Chest Pe W/cm &/or Wo Cm  05/06/2013   CLINICAL DATA:  Chest pain, tachycardia, eval for PE  EXAM: CT ANGIOGRAPHY CHEST WITH CONTRAST  TECHNIQUE: Multidetector CT imaging of the chest was performed using the standard protocol during bolus administration of intravenous contrast. Multiplanar CT image reconstructions and MIPs were obtained to evaluate the vascular anatomy.  CONTRAST:  OMNIPAQUE IOHEXOL 350 MG/ML SOLN  COMPARISON:  None.  FINDINGS: Diffuse enlargement of the thyroid is appreciated with extension into the superior aspect of the mediastinum. Thoracic inlet is otherwise unremarkable.  No further mediastinal masses nor adenopathy.  There is suboptimal  opacification of the pulmonary arteries. There is no evidence of filling defects within the main, lobar, or segmental pulmonary arteries. No evidence of a thoracic aortic aneurysm nor dissection.  Diffuse wall thickening of the esophagus is appreciated.  Mild hypoventilation is identified within the lung bases. The lungs otherwise clear.  There is diffuse low attenuation within the liver parenchyma. The liver is only partially visualized and diffuse enlargement cannot be excluded. Remaining visualized upper abdominal viscera are unremarkable.  No aggressive appearing osseous lesions.  Review of the MIP images confirms the above findings.  IMPRESSION: 1. No CT evidence of pulmonary arterial embolic disease 2. Lungs otherwise clear 3. Hepatic steatosis 4. Thyromegaly further evaluation with nonemergent thyroid ultrasound recommended 5. Findings which may reflect sequela of esophagitis.   Electronically Signed   By: Salome Holmes M.D.   On: 05/06/2013 20:45   US Soft Tissue Head/neck  05/07/2013   CLINICAL DATA:   Thyromegaly.  EXAM: THYROID ULTRASOUND  TECHNIQUE: Ultrasound examination of the thyroid gland and adjacent soft tissues was performed.  COMPARISON:  None.  FINDINGS: Right thyroid lobe  Measurements: 6.2 x 2.6 x 2.3 cm. There is a dominant 2.3 x 0 1.7 x 1.6 cm primarily solid nodule in the mid right lobe. Within this dominant nodule is a distinct 1.1 cm slightly hypoechoic region.  Left thyroid lobe  Measurements: 5.7 x 3.4 x 2.0 cm. There are numerous nodules in the left lobe with a 1.8 x 1.1 x 1.1 cm solid slightly hyperechoic nodule in the upper pole with microcalcifications, a 1.1 x 1.4 x 1.1 cm slightly hypoechoic nodule in the posterior medial aspect of the lower pole, and a dominant 2.5 x 2.4 x 2.3 cm inhomogeneous solid nodule in the lower pole.  Isthmus  Thickness: 1.3 cm. 2.0 x 1.0 x 1.2 cm nodule arising from the superior aspect of the isthmus, solid.  Lymphadenopathy  None visualized.  IMPRESSION: Multinodular goiter. Multiple nodules meet criteria for fine needle aspiration biopsy if not previously assessed. This recommendation follows the consensus statement: Management of Thyroid Nodules Detected at [place]: Society of Radiologists in Ultrasound Consensus Conference Statement. Radiology 2005; [id number].   Electronically Signed   By: Geanie Cooley M.D.   On: 05/07/2013 16:48   US Abdomen Complete  05/06/2013   CLINICAL DATA:  Abdominal pain, nausea, vomiting, diarrhea. The study is limited secondary to patient body habitus.  EXAM: ULTRASOUND ABDOMEN COMPLETE  COMPARISON:  None.  FINDINGS: Gallbladder:  No gallstones or wall thickening visualized, measuring 2.1 mm. No sonographic Murphy sign noted.  Common bile duct:  Diameter: 4.8 mm  Liver:  Dense, echogenic, heterogeneous architecture, no focal abnormalities.  IVC:  Not visualized  Pancreas:  Not visualized  Spleen:  Size and appearance within normal limits.  Right Kidney:  Length: 11.2 cm. Limited visualization. Echogenicity within normal limits. No  mass or hydronephrosis visualized.  Left Kidney:  Length: 11.6 cm. Echogenicity within normal limits. No mass or hydronephrosis visualized.  Abdominal aorta:  No aneurysm visualized.  Other findings:  None.  IMPRESSION: 1. Heterogeneous echogenic liver differential considerations hepatic steatosis, hepatocellular disease. 2. No evidence of acute cholecystitis.   Electronically Signed   By: Salome Holmes M.D.   On: 05/06/2013 19:20   Dg Chest Port 1 View  05/06/2013   CLINICAL DATA:  CHEST PAIN AND SHORTNESS OF BREATH.  EXAM: PORTABLE CHEST - 1 VIEW  COMPARISON:  DG CHEST 2 VIEW dated 06/16/2012  FINDINGS: 1717 HRS. The lungs are clear  without focal infiltrate, edema, pneumothorax or pleural effusion. The cardio pericardial silhouette is enlarged. Imaged bony structures of the thorax are intact. Telemetry leads overlie the chest.  IMPRESSION: Cardiomegaly without acute cardiopulmonary process.  .   Electronically Signed   By: Kennith Center M.D.   On: 05/06/2013 17:33    Microbiology: Recent Results (from the past 240 hour(s))  URINE CULTURE     Status: None   Collection Time    05/06/13  5:40 PM      Result Value Ref Range Status   Specimen Description URINE, CLEAN CATCH   Final   Special Requests NONE   Final   Culture  Setup Time     Final   Value: 05/07/2013 03:15     Performed at [facility] Count     Final   Value: >=100,000 COLONIES/ML     Performed at [facility]   Culture     Final   Value: ESCHERICHIA COLI     Performed at [facility]   Report Status PENDING   Incomplete  CULTURE, BLOOD (ROUTINE X 2)     Status: None   Collection Time    05/06/13  9:30 PM      Result Value Ref Range Status   Specimen Description BLOOD RIGHT ANTECUBITAL   Final   Special Requests BOTTLES DRAWN AEROBIC AND ANAEROBIC   Final   Culture  Setup Time     Final   Value: 05/07/2013 02:04     Performed at [facility]   Culture     Final   Value:         BLOOD CULTURE RECEIVED NO GROWTH TO DATE CULTURE WILL BE HELD FOR 5 DAYS BEFORE ISSUING A FINAL NEGATIVE REPORT     Performed at [facility]   Report Status PENDING   Incomplete  CULTURE, BLOOD (ROUTINE X 2)     Status: None   Collection Time    05/06/13  9:40 PM      Result Value Ref Range Status   Specimen Description BLOOD LEFT ANTECUBITAL   Final   Special Requests BOTTLES DRAWN AEROBIC AND ANAEROBIC   Final   Culture  Setup Time     Final   Value: 05/07/2013 02:04     Performed at [facility]   Culture     Final   Value:        BLOOD CULTURE RECEIVED NO GROWTH TO DATE CULTURE WILL BE HELD FOR 5 DAYS BEFORE ISSUING A FINAL NEGATIVE REPORT     Performed at [facility]   Report Status PENDING   Incomplete  CLOSTRIDIUM DIFFICILE BY PCR     Status: None   Collection Time    05/07/13  2:05 PM      Result Value Ref Range Status   C difficile by pcr NEGATIVE  NEGATIVE Final   Comment: Performed at [facility]     Labs: Basic Metabolic Panel:  Recent Labs Lab 05/06/13 1655 05/06/13 1707 05/07/13 0440 05/08/13 0413  NA 132*  --  141 143  K 2.9*  --  3.1* 3.6*  CL 94*  --  103 106  CO2 25  --  26 30  GLUCOSE 118*  --  110* 105*  BUN 18  --  15 12  CREATININE 0.83  --  0.98 0.90  CALCIUM 9.4  --  8.4 8.7  MG  --  1.7  --   --    Liver Function Tests:  Recent Labs Lab 05/06/13 1655 05/07/13 0440  AST 39* 43*  ALT 41* 41*  ALKPHOS 92 75  BILITOT 0.4 0.4  PROT 7.3 6.2  ALBUMIN 3.9 3.2*    Recent Labs Lab 05/06/13 1655  LIPASE 20   No results found for this basename: AMMONIA,  in the last 168 hours CBC:  Recent Labs Lab 05/06/13 1655 05/07/13 0440  WBC 9.6 8.4  NEUTROABS 8.3* 4.9  HGB 13.4 11.8*  HCT 40.2 36.1  MCV 80.9 82.2  PLT 254 218   Cardiac Enzymes:  Recent Labs Lab 05/06/13 1655 05/07/13 0440 05/07/13 1050 05/07/13 1605  TROPONINI <0.30 <0.30 <0.30 <0.30   BNP: BNP (last 3 results)  Recent  Labs  05/06/13 1655  PROBNP 49.5   CBG:  Recent Labs Lab 05/07/13 0744 05/07/13 1154 05/07/13 1724 05/07/13 2116 05/08/13 0738  GLUCAP 116* 101* 122* 99 123*       Signed:  Penny Pia  [facility] 05/08/2013, 10:28 AM

## 2013-05-09 LAB — URINE CULTURE: Colony Count: >=100000

## 2013-05-11 LAB — STOOL CULTURE

## 2013-05-13 LAB — CULTURE, BLOOD (ROUTINE X 2)
Culture: NO GROWTH
Culture: NO GROWTH

## 2013-09-28 ENCOUNTER — Other Ambulatory Visit: Payer: Self-pay | Admitting: Internal Medicine

## 2013-09-28 DIAGNOSIS — Z1231 Encounter for screening mammogram for malignant neoplasm of breast: Secondary | ICD-10-CM

## 2013-10-14 ENCOUNTER — Ambulatory Visit: Payer: Self-pay

## 2013-10-19 ENCOUNTER — Ambulatory Visit (INDEPENDENT_AMBULATORY_CARE_PROVIDER_SITE_OTHER): Payer: Medicare Other | Admitting: Surgery

## 2013-10-19 ENCOUNTER — Other Ambulatory Visit (INDEPENDENT_AMBULATORY_CARE_PROVIDER_SITE_OTHER): Payer: Self-pay

## 2013-10-19 DIAGNOSIS — E042 Nontoxic multinodular goiter: Secondary | ICD-10-CM

## 2013-10-20 ENCOUNTER — Other Ambulatory Visit (INDEPENDENT_AMBULATORY_CARE_PROVIDER_SITE_OTHER): Payer: Self-pay | Admitting: *Deleted

## 2013-10-20 DIAGNOSIS — E042 Nontoxic multinodular goiter: Secondary | ICD-10-CM

## 2013-10-22 ENCOUNTER — Ambulatory Visit
Admission: RE | Admit: 2013-10-22 | Discharge: 2013-10-22 | Disposition: A | Discharge status: Home / Self Care | Payer: Medicare Other | Source: Ambulatory Visit | Attending: Surgery | Admitting: Surgery

## 2013-10-22 ENCOUNTER — Other Ambulatory Visit (HOSPITAL_COMMUNITY)
Admission: RE | Admit: 2013-10-22 | Discharge: 2013-10-22 | Disposition: A | Discharge status: Home / Self Care | Payer: Medicare Other | Source: Ambulatory Visit | Attending: Interventional Radiology | Admitting: Interventional Radiology

## 2013-10-22 DIAGNOSIS — E042 Nontoxic multinodular goiter: Secondary | ICD-10-CM | POA: Diagnosis present

## 2013-10-23 ENCOUNTER — Encounter (INDEPENDENT_AMBULATORY_CARE_PROVIDER_SITE_OTHER): Payer: Self-pay

## 2013-10-23 ENCOUNTER — Ambulatory Visit
Admission: RE | Admit: 2013-10-23 | Discharge: 2013-10-23 | Disposition: A | Discharge status: Home / Self Care | Payer: Medicare Other | Source: Ambulatory Visit | Attending: Internal Medicine | Admitting: Internal Medicine

## 2013-10-23 DIAGNOSIS — Z1231 Encounter for screening mammogram for malignant neoplasm of breast: Secondary | ICD-10-CM

## 2014-05-21 ENCOUNTER — Encounter (HOSPITAL_COMMUNITY): Payer: Self-pay | Admitting: Emergency Medicine

## 2014-05-21 ENCOUNTER — Emergency Department (HOSPITAL_COMMUNITY)
Admission: EM | Admit: 2014-05-21 | Discharge: 2014-05-21 | Disposition: A | Discharge status: Home / Self Care | Payer: Medicare Other | Attending: Emergency Medicine | Admitting: Emergency Medicine

## 2014-05-21 ENCOUNTER — Emergency Department (HOSPITAL_COMMUNITY): Payer: Medicare Other

## 2014-05-21 DIAGNOSIS — M797 Fibromyalgia: Secondary | ICD-10-CM | POA: Insufficient documentation

## 2014-05-21 DIAGNOSIS — Z7982 Long term (current) use of aspirin: Secondary | ICD-10-CM | POA: Diagnosis not present

## 2014-05-21 DIAGNOSIS — I2510 Atherosclerotic heart disease of native coronary artery without angina pectoris: Secondary | ICD-10-CM | POA: Diagnosis not present

## 2014-05-21 DIAGNOSIS — F329 Major depressive disorder, single episode, unspecified: Secondary | ICD-10-CM | POA: Diagnosis not present

## 2014-05-21 DIAGNOSIS — I252 Old myocardial infarction: Secondary | ICD-10-CM | POA: Diagnosis not present

## 2014-05-21 DIAGNOSIS — Z87891 Personal history of nicotine dependence: Secondary | ICD-10-CM | POA: Insufficient documentation

## 2014-05-21 DIAGNOSIS — M79605 Pain in left leg: Secondary | ICD-10-CM | POA: Diagnosis present

## 2014-05-21 DIAGNOSIS — M25552 Pain in left hip: Secondary | ICD-10-CM | POA: Diagnosis not present

## 2014-05-21 DIAGNOSIS — I10 Essential (primary) hypertension: Secondary | ICD-10-CM | POA: Insufficient documentation

## 2014-05-21 DIAGNOSIS — Z79899 Other long term (current) drug therapy: Secondary | ICD-10-CM | POA: Insufficient documentation

## 2014-05-21 DIAGNOSIS — Z9889 Other specified postprocedural states: Secondary | ICD-10-CM | POA: Insufficient documentation

## 2014-05-21 DIAGNOSIS — M25559 Pain in unspecified hip: Secondary | ICD-10-CM

## 2014-05-21 DIAGNOSIS — E119 Type 2 diabetes mellitus without complications: Secondary | ICD-10-CM | POA: Insufficient documentation

## 2014-05-21 LAB — CBG MONITORING, ED: Glucose-Capillary: 191 mg/dL — ABNORMAL HIGH (ref 70–99)

## 2014-05-21 MED ORDER — HYDROCODONE-ACETAMINOPHEN 5-325 MG PO TABS: 1.0000 | ORAL_TABLET | Freq: Four times a day (QID) | ORAL | Status: DC | PRN

## 2014-05-21 MED ORDER — HYDROCODONE-ACETAMINOPHEN 5-325 MG PO TABS
2.0000 | ORAL_TABLET | Freq: Once | ORAL | Status: AC
Start: 2014-05-21 — End: 2014-05-21
  Administered 2014-05-21: 2 via ORAL
  Filled 2014-05-21: qty 2

## 2014-05-21 NOTE — ED Provider Notes (Signed)
CSN: [id number]     Arrival date & time 05/21/14  1820 History   First MD Initiated Contact with Patient 05/21/14 1904     Chief Complaint  Patient presents with  . Leg Pain     (Consider location/radiation/quality/duration/timing/severity/associated sxs/prior Treatment) Patient is a 64 y.o. female presenting with leg pain. The history is provided by the patient.  Leg Pain Location:  Hip Time since incident:  3 weeks Injury: no   Hip location:  L hip Pain details:    Quality:  Aching   Radiates to:  Does not radiate   Severity:  Mild   Onset quality:  Gradual   Timing:  Constant   Progression:  Worsening Chronicity:  New Relieved by:  Nothing Worsened by:  Bearing weight Associated symptoms: no fever     Past Medical History  Diagnosis Date  . Diabetes mellitus   . Hypertension   . Coronary artery disease   . Depression   . Fibromyalgia   . MI (myocardial infarction)    Past Surgical History  Procedure Laterality Date  . Abdominal hysterectomy    . Breast surgery    . Cardiac catheterization      1995   Family History  Problem Relation Age of Onset  . Hypertension Mother   . Diabetes Mother   . Stroke Mother   . Hypertension Other   . Diabetes Other    History  Substance Use Topics  . Smoking status: Former Smoker -- 0.25 packs/day    Types: Cigarettes    Quit date: 04/20/2014  . Smokeless tobacco: Never Used  . Alcohol Use: No   OB History    No data available     Review of Systems  Constitutional: Negative for fever.  Respiratory: Negative for cough and shortness of breath.   Gastrointestinal: Negative for vomiting and abdominal pain.  All other systems reviewed and are negative.     Allergies  Ace inhibitors and Procardia  Home Medications   Prior to Admission medications   Medication Sig Start Date End Date Taking? Authorizing Provider  amLODipine (NORVASC) 10 MG tablet Take 10 mg by mouth daily. 06/03/11  Yes Azalia Bilis, MD   aspirin 81 MG chewable tablet Chew 81 mg by mouth daily.   Yes Historical Provider, MD  atenolol-chlorthalidone (TENORETIC) 50-25 MG per tablet Take 1 tablet by mouth every morning. 04/06/14  Yes Historical Provider, MD  buPROPion (WELLBUTRIN SR) 150 MG 12 hr tablet Take 150 mg by mouth 2 (two) times daily. 06/03/11  Yes Azalia Bilis, MD  hydrochlorothiazide (HYDRODIURIL) 25 MG tablet Take 25 mg by mouth daily. 05/11/14  Yes Historical Provider, MD  levothyroxine (SYNTHROID, LEVOTHROID) 50 MCG tablet Take 50 mcg by mouth daily before breakfast.   Yes Historical Provider, MD  metFORMIN (GLUCOPHAGE) 1000 MG tablet Take 1,000 mg by mouth 2 (two) times daily. 05/11/14  Yes Historical Provider, MD  Multiple Vitamin (MULTIVITAMIN WITH MINERALS) TABS tablet Take 1 tablet by mouth daily.   Yes Historical Provider, MD  potassium chloride (K-DUR,KLOR-CON) 10 MEQ tablet Take 10 mEq by mouth daily.   Yes Historical Provider, MD  venlafaxine XR (EFFEXOR-XR) 75 MG 24 hr capsule Take 75 mg by mouth 2 (two) times daily.  06/03/11  Yes Azalia Bilis, MD   BP 140/83 mmHg  Pulse 104  Temp(Src) 97.7 F (36.5 C) (Oral)  Resp 19  SpO2 99% Physical Exam  Constitutional: She is oriented to person, place, and time. She appears well-developed and  well-nourished. No distress.  HENT:  Head: Normocephalic and atraumatic.  Mouth/Throat: Oropharynx is clear and moist.  Eyes: EOM are normal. Pupils are equal, round, and reactive to light.  Neck: Normal range of motion. Neck supple.  Cardiovascular: Normal rate and regular rhythm.  Exam reveals no friction rub.   No murmur heard. Pulmonary/Chest: Effort normal and breath sounds normal. No respiratory distress. She has no wheezes. She has no rales.  Abdominal: Soft. She exhibits no distension. There is no tenderness. There is no rebound.  Musculoskeletal: Normal range of motion. She exhibits no edema.  Neurological: She is alert and oriented to person, place, and time.  Skin:  No rash noted. She is not diaphoretic.  Nursing note and vitals reviewed.   ED Course  Procedures (including critical care time) Labs Review Labs Reviewed  CBG MONITORING, ED - Abnormal; Notable for the following:    Glucose-Capillary 191 (*)    All other components within normal limits    Imaging Review Dg Hip Unilat With Pelvis 2-3 Views Left  05/21/2014   CLINICAL DATA:  Acute on chronic left hip pain.  EXAM: LEFT HIP (WITH PELVIS) 2-3 VIEWS  COMPARISON:  None.  FINDINGS: The cortical margins of the bony pelvis are intact. No fracture. Pubic symphysis and sacroiliac joints are congruent. There is mild-moderate osteoarthritis of the left hip. Both femoral heads are well-seated in the respective acetabula. No radiographic findings of avascular necrosis. Incidental note of hemi transitional lumbosacral anatomy, partially included.  IMPRESSION: Mild moderate left hip osteoarthritis.  No acute bony findings.   Electronically Signed   By: Rubye Oaks M.D.   On: 05/21/2014 20:11     EKG Interpretation None      MDM   Final diagnoses:  Hip pain  Left hip pain    31F here with L hip pain for about 3 weeks. Hx of knee pain, has finally had relief with some cortisone injections from Dr. Jerl Santos. Has mentioned her hip pain to him, and she is getting referred to another doctor for her hip pain. No trauma recently. No back pain or difficulty urinating. Normal pulses, sensation, strength. AFVSS here. Will xray hip. I like this is likely her arthritis. Xray shows arthritis. Stable for discharge. Given small amount of narcotic.  Elwin Mocha, MD 05/22/14 [phone]

## 2014-05-21 NOTE — ED Notes (Signed)
Per pt, states she has been getting injections in left knee which has helped her pain but now having left thigh pain radiating to groin-had hip xray and it was normal

## 2014-05-21 NOTE — Discharge Instructions (Signed)
Arthritis, Nonspecific °Arthritis is inflammation of a joint. This usually means pain, redness, warmth or swelling are present. One or more joints may be involved. There are a number of types of arthritis. Your caregiver may not be able to tell what type of arthritis you have right away. °CAUSES  °The most common cause of arthritis is the wear and tear on the joint (osteoarthritis). This causes damage to the cartilage, which can break down over time. The knees, hips, back and neck are most often affected by this type of arthritis. °Other types of arthritis and common causes of joint pain include: °· Sprains and other injuries near the joint. Sometimes minor sprains and injuries cause pain and swelling that develop hours later. °· Rheumatoid arthritis. This affects hands, feet and knees. It usually affects both sides of your body at the same time. It is often associated with chronic ailments, fever, weight loss and general weakness. °· Crystal arthritis. Gout and pseudo gout can cause occasional acute severe pain, redness and swelling in the foot, ankle, or knee. °· Infectious arthritis. Bacteria can get into a joint through a break in overlying skin. This can cause infection of the joint. Bacteria and viruses can also spread through the blood and affect your joints. °· Drug, infectious and allergy reactions. Sometimes joints can become mildly painful and slightly swollen with these types of illnesses. °SYMPTOMS  °· Pain is the main symptom. °· Your joint or joints can also be red, swollen and warm or hot to the touch. °· You may have a fever with certain types of arthritis, or even feel overall ill. °· The joint with arthritis will hurt with movement. Stiffness is present with some types of arthritis. °DIAGNOSIS  °Your caregiver will suspect arthritis based on your description of your symptoms and on your exam. Testing may be needed to find the type of arthritis: °· Blood and sometimes urine tests. °· X-ray tests  and sometimes CT or MRI scans. °· Removal of fluid from the joint (arthrocentesis) is done to check for bacteria, crystals or other causes. Your caregiver (or a specialist) will numb the area over the joint with a local anesthetic, and use a needle to remove joint fluid for examination. This procedure is only minimally uncomfortable. °· Even with these tests, your caregiver may not be able to tell what kind of arthritis you have. Consultation with a specialist (rheumatologist) may be helpful. °TREATMENT  °Your caregiver will discuss with you treatment specific to your type of arthritis. If the specific type cannot be determined, then the following general recommendations may apply. °Treatment of severe joint pain includes: °· Rest. °· Elevation. °· Anti-inflammatory medication (for example, ibuprofen) may be prescribed. Avoiding activities that cause increased pain. °· Only take over-the-counter or prescription medicines for pain and discomfort as recommended by your caregiver. °· Cold packs over an inflamed joint may be used for 10 to 15 minutes every hour. Hot packs sometimes feel better, but do not use overnight. Do not use hot packs if you are diabetic without your caregiver's permission. °· A cortisone shot into arthritic joints may help reduce pain and swelling. °· Any acute arthritis that gets worse over the next 1 to 2 days needs to be looked at to be sure there is no joint infection. °Long-term arthritis treatment involves modifying activities and lifestyle to reduce joint stress jarring. This can include weight loss. Also, exercise is needed to nourish the joint cartilage and remove waste. This helps keep the muscles   around the joint strong. °HOME CARE INSTRUCTIONS  °· Do not take aspirin to relieve pain if gout is suspected. This elevates uric acid levels. °· Only take over-the-counter or prescription medicines for pain, discomfort or fever as directed by your caregiver. °· Rest the joint as much as  possible. °· If your joint is swollen, keep it elevated. °· Use crutches if the painful joint is in your leg. °· Drinking plenty of fluids may help for certain types of arthritis. °· Follow your caregiver's dietary instructions. °· Try low-impact exercise such as: °¨ Swimming. °¨ Water aerobics. °¨ Biking. °¨ Walking. °· Morning stiffness is often relieved by a warm shower. °· Put your joints through regular range-of-motion. °SEEK MEDICAL CARE IF:  °· You do not feel better in 24 hours or are getting worse. °· You have side effects to medications, or are not getting better with treatment. °SEEK IMMEDIATE MEDICAL CARE IF:  °· You have a fever. °· You develop severe joint pain, swelling or redness. °· Many joints are involved and become painful and swollen. °· There is severe back pain and/or leg weakness. °· You have loss of bowel or bladder control. °Document Released: 03/01/2004 Document Revised: 04/16/2011 Document Reviewed: 03/17/2008 °Exit[employer]® Patient Information ©2015 [employer]. This information is not intended to replace advice given to you by your health care provider. Make sure you discuss any questions you have with your health care provider. ° °

## 2014-08-20 ENCOUNTER — Encounter (HOSPITAL_COMMUNITY): Payer: Self-pay

## 2014-08-20 ENCOUNTER — Emergency Department (HOSPITAL_COMMUNITY): Payer: Medicare Other

## 2014-08-20 ENCOUNTER — Observation Stay (HOSPITAL_COMMUNITY)
Admission: EM | Admit: 2014-08-20 | Discharge: 2014-08-21 | Disposition: A | Discharge status: Home / Self Care | Payer: Medicare Other | Attending: Internal Medicine | Admitting: Internal Medicine

## 2014-08-20 DIAGNOSIS — I10 Essential (primary) hypertension: Secondary | ICD-10-CM | POA: Diagnosis present

## 2014-08-20 DIAGNOSIS — Z79899 Other long term (current) drug therapy: Secondary | ICD-10-CM | POA: Insufficient documentation

## 2014-08-20 DIAGNOSIS — N179 Acute kidney failure, unspecified: Secondary | ICD-10-CM

## 2014-08-20 DIAGNOSIS — Z79891 Long term (current) use of opiate analgesic: Secondary | ICD-10-CM | POA: Insufficient documentation

## 2014-08-20 DIAGNOSIS — E038 Other specified hypothyroidism: Secondary | ICD-10-CM

## 2014-08-20 DIAGNOSIS — Z6839 Body mass index (BMI) 39.0-39.9, adult: Secondary | ICD-10-CM | POA: Diagnosis not present

## 2014-08-20 DIAGNOSIS — M79604 Pain in right leg: Secondary | ICD-10-CM | POA: Diagnosis not present

## 2014-08-20 DIAGNOSIS — E119 Type 2 diabetes mellitus without complications: Secondary | ICD-10-CM

## 2014-08-20 DIAGNOSIS — E669 Obesity, unspecified: Secondary | ICD-10-CM | POA: Diagnosis not present

## 2014-08-20 DIAGNOSIS — Z7982 Long term (current) use of aspirin: Secondary | ICD-10-CM | POA: Diagnosis not present

## 2014-08-20 DIAGNOSIS — R0789 Other chest pain: Secondary | ICD-10-CM

## 2014-08-20 DIAGNOSIS — F329 Major depressive disorder, single episode, unspecified: Secondary | ICD-10-CM | POA: Insufficient documentation

## 2014-08-20 DIAGNOSIS — M79605 Pain in left leg: Secondary | ICD-10-CM | POA: Diagnosis not present

## 2014-08-20 DIAGNOSIS — Z87891 Personal history of nicotine dependence: Secondary | ICD-10-CM | POA: Diagnosis not present

## 2014-08-20 DIAGNOSIS — E039 Hypothyroidism, unspecified: Secondary | ICD-10-CM | POA: Diagnosis not present

## 2014-08-20 DIAGNOSIS — R079 Chest pain, unspecified: Secondary | ICD-10-CM | POA: Diagnosis not present

## 2014-08-20 DIAGNOSIS — M797 Fibromyalgia: Secondary | ICD-10-CM | POA: Diagnosis not present

## 2014-08-20 DIAGNOSIS — I2510 Atherosclerotic heart disease of native coronary artery without angina pectoris: Secondary | ICD-10-CM | POA: Insufficient documentation

## 2014-08-20 DIAGNOSIS — I252 Old myocardial infarction: Secondary | ICD-10-CM | POA: Diagnosis not present

## 2014-08-20 DIAGNOSIS — E876 Hypokalemia: Secondary | ICD-10-CM | POA: Diagnosis not present

## 2014-08-20 DIAGNOSIS — E1165 Type 2 diabetes mellitus with hyperglycemia: Secondary | ICD-10-CM | POA: Diagnosis not present

## 2014-08-20 LAB — BASIC METABOLIC PANEL
Anion gap: 7 (ref 5–15)
BUN: 23 mg/dL — ABNORMAL HIGH (ref 6–20)
CO2: 28 mmol/L (ref 22–32)
Calcium: 9.3 mg/dL (ref 8.9–10.3)
Chloride: 103 mmol/L (ref 101–111)
Creatinine, Ser: 1.38 mg/dL — ABNORMAL HIGH (ref 0.44–1.00)
GFR calc Af Amer: 45 mL/min — ABNORMAL LOW (ref >60)
GFR calc non Af Amer: 39 mL/min — ABNORMAL LOW (ref >60)
Glucose, Bld: 184 mg/dL — ABNORMAL HIGH (ref 65–99)
Potassium: 3.4 mmol/L — ABNORMAL LOW (ref 3.5–5.1)
Sodium: 138 mmol/L (ref 135–145)

## 2014-08-20 LAB — CBC
HCT: 38.4 % (ref 36.0–46.0)
HCT: 37 % (ref 36.0–46.0)
Hemoglobin: 12 g/dL (ref 12.0–15.0)
Hemoglobin: 11.7 g/dL — ABNORMAL LOW (ref 12.0–15.0)
MCH: 25.9 pg — ABNORMAL LOW (ref 26.0–34.0)
MCH: 26.4 pg (ref 26.0–34.0)
MCHC: 31.3 g/dL (ref 30.0–36.0)
MCHC: 31.6 g/dL (ref 30.0–36.0)
MCV: 82.8 fL (ref 78.0–100.0)
MCV: 83.3 fL (ref 78.0–100.0)
Platelets: 317 10*3/uL (ref 150–400)
Platelets: 291 10*3/uL (ref 150–400)
RBC: 4.64 MIL/uL (ref 3.87–5.11)
RBC: 4.44 MIL/uL (ref 3.87–5.11)
RDW: 15 % (ref 11.5–15.5)
RDW: 15 % (ref 11.5–15.5)
WBC: 7.7 10*3/uL (ref 4.0–10.5)
WBC: 8.7 10*3/uL (ref 4.0–10.5)

## 2014-08-20 LAB — I-STAT TROPONIN, ED: Troponin i, poc: 0 ng/mL (ref 0.00–0.08)

## 2014-08-20 LAB — CREATININE, SERUM
Creatinine, Ser: 1.09 mg/dL — ABNORMAL HIGH (ref 0.44–1.00)
GFR calc Af Amer: >60 mL/min (ref >60)
GFR calc non Af Amer: 52 mL/min — ABNORMAL LOW (ref >60)

## 2014-08-20 LAB — TROPONIN I: Troponin I: <0.03 ng/mL (ref <0.031)

## 2014-08-20 MED ORDER — ASPIRIN 81 MG PO CHEW: 81.0000 mg | CHEWABLE_TABLET | Freq: Every day | ORAL | Status: DC | PRN

## 2014-08-20 MED ORDER — AMLODIPINE BESYLATE 10 MG PO TABS
10.0000 mg | ORAL_TABLET | Freq: Every day | ORAL | Status: DC
  Administered 2014-08-21: 10 mg via ORAL
  Filled 2014-08-20: qty 1

## 2014-08-20 MED ORDER — VENLAFAXINE HCL 75 MG PO TABS
75.0000 mg | ORAL_TABLET | Freq: Three times a day (TID) | ORAL | Status: DC
  Administered 2014-08-20 – 2014-08-21 (×2): 75 mg via ORAL
  Filled 2014-08-20 (×2): qty 1

## 2014-08-20 MED ORDER — BUPROPION HCL ER (SR) 150 MG PO TB12
150.0000 mg | ORAL_TABLET | Freq: Two times a day (BID) | ORAL | Status: DC
  Administered 2014-08-20 – 2014-08-21 (×2): 150 mg via ORAL
  Filled 2014-08-20 (×4): qty 1

## 2014-08-20 MED ORDER — ENOXAPARIN SODIUM 40 MG/0.4ML ~~LOC~~ SOLN: 40.0000 mg | SUBCUTANEOUS | Status: DC

## 2014-08-20 MED ORDER — ACETAMINOPHEN 325 MG PO TABS: 650.0000 mg | ORAL_TABLET | ORAL | Status: DC | PRN

## 2014-08-20 MED ORDER — LEVOTHYROXINE SODIUM 25 MCG PO TABS
50.0000 ug | ORAL_TABLET | Freq: Every day | ORAL | Status: DC
  Administered 2014-08-21: 50 ug via ORAL
  Filled 2014-08-20: qty 1
  Filled 2014-08-20: qty 2

## 2014-08-20 MED ORDER — POTASSIUM CHLORIDE CRYS ER 20 MEQ PO TBCR
40.0000 meq | EXTENDED_RELEASE_TABLET | Freq: Once | ORAL | Status: AC
  Administered 2014-08-20: 40 meq via ORAL
  Filled 2014-08-20: qty 2

## 2014-08-20 MED ORDER — CHLORTHALIDONE 25 MG PO TABS: 25.0000 mg | ORAL_TABLET | Freq: Every day | ORAL | Status: DC

## 2014-08-20 MED ORDER — SODIUM CHLORIDE 0.9 % IV SOLN: 250.0000 mL | INTRAVENOUS | Status: DC | PRN

## 2014-08-20 MED ORDER — ATENOLOL 50 MG PO TABS
50.0000 mg | ORAL_TABLET | Freq: Every day | ORAL | Status: DC
  Administered 2014-08-21: 50 mg via ORAL
  Filled 2014-08-20: qty 1

## 2014-08-20 MED ORDER — METFORMIN HCL 500 MG PO TABS
1000.0000 mg | ORAL_TABLET | Freq: Two times a day (BID) | ORAL | Status: DC
  Filled 2014-08-20 (×2): qty 2

## 2014-08-20 MED ORDER — ASPIRIN 81 MG PO CHEW
324.0000 mg | CHEWABLE_TABLET | Freq: Once | ORAL | Status: AC
  Administered 2014-08-20: 324 mg via ORAL
  Filled 2014-08-20: qty 4

## 2014-08-20 MED ORDER — ASPIRIN EC 81 MG PO TBEC
81.0000 mg | DELAYED_RELEASE_TABLET | Freq: Every day | ORAL | Status: DC
  Administered 2014-08-21: 81 mg via ORAL
  Filled 2014-08-20: qty 1

## 2014-08-20 MED ORDER — SODIUM CHLORIDE 0.9 % IJ SOLN: 3.0000 mL | INTRAMUSCULAR | Status: DC | PRN

## 2014-08-20 MED ORDER — ASPIRIN 300 MG RE SUPP
300.0000 mg | RECTAL | Status: AC
  Filled 2014-08-20: qty 1

## 2014-08-20 MED ORDER — ATENOLOL-CHLORTHALIDONE 50-25 MG PO TABS: 1.0000 | ORAL_TABLET | Freq: Every morning | ORAL | Status: DC

## 2014-08-20 MED ORDER — POTASSIUM CHLORIDE CRYS ER 20 MEQ PO TBCR: 40.0000 meq | EXTENDED_RELEASE_TABLET | ORAL | Status: DC

## 2014-08-20 MED ORDER — NITROGLYCERIN 0.4 MG SL SUBL: 0.4000 mg | SUBLINGUAL_TABLET | SUBLINGUAL | Status: DC | PRN

## 2014-08-20 MED ORDER — SODIUM CHLORIDE 0.9 % IJ SOLN
3.0000 mL | Freq: Two times a day (BID) | INTRAMUSCULAR | Status: DC
  Administered 2014-08-20: 3 mL via INTRAVENOUS

## 2014-08-20 MED ORDER — POTASSIUM CHLORIDE CRYS ER 20 MEQ PO TBCR
20.0000 meq | EXTENDED_RELEASE_TABLET | Freq: Every day | ORAL | Status: DC
  Administered 2014-08-21: 20 meq via ORAL
  Filled 2014-08-20: qty 1

## 2014-08-20 MED ORDER — HYDROCODONE-ACETAMINOPHEN 10-325 MG PO TABS
1.0000 | ORAL_TABLET | Freq: Four times a day (QID) | ORAL | Status: DC | PRN
  Administered 2014-08-20: 1 via ORAL
  Filled 2014-08-20: qty 1

## 2014-08-20 MED ORDER — ONDANSETRON HCL 4 MG/2ML IJ SOLN: 4.0000 mg | Freq: Four times a day (QID) | INTRAMUSCULAR | Status: DC | PRN

## 2014-08-20 MED ORDER — ASPIRIN 81 MG PO CHEW
324.0000 mg | CHEWABLE_TABLET | ORAL | Status: AC
  Administered 2014-08-20: 324 mg via ORAL
  Filled 2014-08-20: qty 4

## 2014-08-20 NOTE — ED Notes (Signed)
Pt presents with c/o chest pain that started approx 3 weeks ago and left leg pain that started around the same time as her chest pain began. Pt reports the pain is in the left side of her chest and radiates to her left arm. Pt reports that in terms of her left leg, she is feeling pain all the way down that leg, denies any injury, ambulatory to triage.

## 2014-08-20 NOTE — ED Notes (Signed)
rn attempted to start IV, pt kept jerking hand back, rn explained pt could not jerk hand back when rn attempting IV. Pt stated "I am going to jerk if it hurts".  Pharmacy at bedside.

## 2014-08-20 NOTE — Progress Notes (Signed)
EDCM spoke to patient at bedside.  Patient confirms her pcp is Dr. Quitman Livings in [place].  System updated.

## 2014-08-20 NOTE — ED Provider Notes (Signed)
CSN: [id number]     Arrival date & time 08/20/14  1241 History   First MD Initiated Contact with Patient 08/20/14 1550     Chief Complaint  Patient presents with  . Chest Pain  . Leg Pain     (Consider location/radiation/quality/duration/timing/severity/associated sxs/prior Treatment) HPI Complains of anterior chest pain rating to right arm onset 3 weeks ago. Pain is intermittent and worse with walking improved with rest. No associated shortness of breath nausea or sweatiness but she is presently chest pain-free. She also complained of pain last night to bilateral thighs, anterior felt like muscle spasm. She does complain of bilateral leg pain chronically for several years, which is unchanged today. Presently asymptomatic. No treatment prior to coming here. Past Medical History  Diagnosis Date  . Diabetes mellitus   . Hypertension   . Coronary artery disease   . Depression   . Fibromyalgia   . MI (myocardial infarction)    no history of myocardial infarction. Past Surgical History  Procedure Laterality Date  . Abdominal hysterectomy    . Breast surgery    . Cardiac catheterization      1995   Family History  Problem Relation Age of Onset  . Hypertension Mother   . Diabetes Mother   . Stroke Mother   . Hypertension Other   . Diabetes Other    History  Substance Use Topics  . Smoking status: Former Smoker -- 0.25 packs/day    Types: Cigarettes    Quit date: 04/20/2014  . Smokeless tobacco: Never Used  . Alcohol Use: No   OB History    No data available     Review of Systems  Constitutional: Negative.   HENT: Negative.   Respiratory: Negative.   Cardiovascular: Positive for chest pain.  Gastrointestinal: Negative.   Musculoskeletal: Positive for myalgias.       Bilateral leg pain  Skin: Negative.   Neurological: Negative.   Psychiatric/Behavioral: Negative.   All other systems reviewed and are negative.     Allergies  Ace inhibitors and Procardia  Home  Medications   Prior to Admission medications   Medication Sig Start Date End Date Taking? Authorizing Provider  amLODipine (NORVASC) 10 MG tablet Take 10 mg by mouth daily. 06/03/11   Azalia Bilis, MD  aspirin 81 MG chewable tablet Chew 81 mg by mouth daily.    Historical Provider, MD  atenolol-chlorthalidone (TENORETIC) 50-25 MG per tablet Take 1 tablet by mouth every morning. 04/06/14   Historical Provider, MD  buPROPion (WELLBUTRIN SR) 150 MG 12 hr tablet Take 150 mg by mouth 2 (two) times daily. 06/03/11   Azalia Bilis, MD  hydrochlorothiazide (HYDRODIURIL) 25 MG tablet Take 25 mg by mouth daily. 05/11/14   Historical Provider, MD  HYDROcodone-acetaminophen (NORCO/VICODIN) 5-325 MG per tablet Take 1 tablet by mouth every 6 (six) hours as needed for moderate pain. 05/21/14   Elwin Mocha, MD  levothyroxine (SYNTHROID, LEVOTHROID) 50 MCG tablet Take 50 mcg by mouth daily before breakfast.    Historical Provider, MD  metFORMIN (GLUCOPHAGE) 1000 MG tablet Take 1,000 mg by mouth 2 (two) times daily. 05/11/14   Historical Provider, MD  Multiple Vitamin (MULTIVITAMIN WITH MINERALS) TABS tablet Take 1 tablet by mouth daily.    Historical Provider, MD  potassium chloride (K-DUR,KLOR-CON) 10 MEQ tablet Take 10 mEq by mouth daily.    Historical Provider, MD  venlafaxine XR (EFFEXOR-XR) 75 MG 24 hr capsule Take 75 mg by mouth 2 (two) times daily.  06/03/11  Azalia Bilis, MD   BP 141/81 mmHg  Pulse 108  Temp(Src) 97.6 F (36.4 C) (Oral)  Resp 21  SpO2 97% Physical Exam  Constitutional: She appears well-developed and well-nourished.  HENT:  Head: Normocephalic and atraumatic.  Eyes: Conjunctivae are normal. Pupils are equal, round, and reactive to light.  Neck: Neck supple. No tracheal deviation present. No thyromegaly present.  Cardiovascular: Normal rate and regular rhythm.   No murmur heard. Pulmonary/Chest: Effort normal and breath sounds normal.  Abdominal: Soft. Bowel sounds are normal. She  exhibits no distension. There is no tenderness.  Obese  Musculoskeletal: Normal range of motion. She exhibits no edema or tenderness.  Neurological: She is alert. Coordination normal.  Skin: Skin is warm and dry. No rash noted.  Psychiatric: She has a normal mood and affect.  Nursing note and vitals reviewed.   ED Course  Procedures (including critical care time) Labs Review Labs Reviewed  BASIC METABOLIC PANEL - Abnormal; Notable for the following:    Potassium 3.4 (*)    Glucose, Bld 184 (*)    BUN 23 (*)    Creatinine, Ser 1.38 (*)    GFR calc non Af Amer 39 (*)    GFR calc Af Amer 45 (*)    All other components within normal limits  CBC - Abnormal; Notable for the following:    MCH 25.9 (*)    All other components within normal limits  I-STAT TROPOININ, ED    Imaging Review Dg Chest 2 View  08/20/2014   CLINICAL DATA:  Three-week history of shortness of breath and left-sided chest pain  EXAM: CHEST  2 VIEW  COMPARISON:  Chest radiograph November 10, 2013 ; chest CT May 06, 2013  FINDINGS: Lungs are clear. The heart size and pulmonary vascularity are normal. No adenopathy. There is mild degenerative change in the thoracic spine.  IMPRESSION: No edema or consolidation.   Electronically Signed   By: Bretta Bang III M.D.   On: 08/20/2014 14:22     EKG Interpretation   Date/Time:  Friday August 20 2014 12:56:56 EDT Ventricular Rate:  101 PR Interval:  158 QRS Duration: 92 QT Interval:  376 QTC Calculation: 487 R Axis:   40 Text Interpretation:  Sinus tachycardia Possible Left atrial enlargement  Borderline ECG No significant change since last tracing Confirmed by  Ethelda Chick  MD, Mairead Schwarzkopf [phone]) on 08/20/2014 3:51:59 PM     Chest x-ray viewed by me Results for orders placed or performed during the hospital encounter of 08/20/14  Basic metabolic panel  Result Value Ref Range   Sodium 138 135 - 145 mmol/L   Potassium 3.4 (L) 3.5 - 5.1 mmol/L   Chloride 103 101 - 111  mmol/L   CO2 28 22 - 32 mmol/L   Glucose, Bld 184 (H) 65 - 99 mg/dL   BUN 23 (H) 6 - 20 mg/dL   Creatinine, Ser 8.00 (H) 0.44 - 1.00 mg/dL   Calcium 9.3 8.9 - 34.9 mg/dL   GFR calc non Af Amer 39 (L) >60 mL/min   GFR calc Af Amer 45 (L) >60 mL/min   Anion gap 7 5 - 15  CBC  Result Value Ref Range   WBC 7.7 4.0 - 10.5 K/uL   RBC 4.64 3.87 - 5.11 MIL/uL   Hemoglobin 12.0 12.0 - 15.0 g/dL   HCT 17.9 15.0 - 56.9 %   MCV 82.8 78.0 - 100.0 fL   MCH 25.9 (L) 26.0 - 34.0 pg  MCHC 31.3 30.0 - 36.0 g/dL   RDW 16.1 09.6 - 04.5 %   Platelets 317 150 - 400 K/uL  I-stat troponin, ED  Result Value Ref Range   Troponin i, poc 0.00 0.00 - 0.08 ng/mL   Comment 3           Dg Chest 2 View  08/20/2014   CLINICAL DATA:  Three-week history of shortness of breath and left-sided chest pain  EXAM: CHEST  2 VIEW  COMPARISON:  Chest radiograph November 10, 2013 ; chest CT May 06, 2013  FINDINGS: Lungs are clear. The heart size and pulmonary vascularity are normal. No adenopathy. There is mild degenerative change in the thoracic spine.  IMPRESSION: No edema or consolidation.   Electronically Signed   By: Bretta Bang III M.D.   On: 08/20/2014 14:22    MDM  Heart score equals 4 based on risk factors, history Final diagnoses:  None   Spoke with Dr. Butler Denmark.   Plan 23 hour observation, telemtry.Spoke with Corine Shelter, PA from [facility] heart care. Cardiology service will consult on patient  Dx #1 chest pain #2 hypokalemia #3 renal insufficency #4 hyperglycemia    Doug Sou, MD 08/20/14 [phone]

## 2014-08-20 NOTE — Progress Notes (Signed)
[facility] text admitting physician regarding admission status.  Awaiting response.

## 2014-08-20 NOTE — H&P (Signed)
[facility] History and Physical  Stacey Fletcher WUJ:[id number] DOB: 02/10/1950 DOA: 08/20/2014   PCP: No PCP Per Patient    Chief Complaint: Chest pain  HPI: Stacey Fletcher is a 64 y.o. female with diabetes mellitus, hypertension who presents with a complaint of left-sided chest pain. She's been having it for 3 weeks on and off. She cannot relate it to exertion as it occurs at rest as well. She cannot relate it to food intake. It's present on the left side of her chest and radiates around to her left arm occasionally has caused some dizziness and nausea. No diaphoresis and no palpitations. It resolves on its own. A few times it is lasted close to 30 minutes but usually is less than this. She's had 3 episodes today last one being while she was in the ER.   General: The patient denies anorexia, fever, weight loss Cardiac: Denies chest pain, syncope, palpitations, pedal edema  Respiratory: Denies cough, shortness of breath, wheezing GI: Denies severe indigestion/heartburn, abdominal pain, nausea, vomiting, diarrhea and constipation GU: Denies hematuria, incontinence, dysuria  Musculoskeletal: Denies arthritis  Skin: Denies suspicious skin lesions Neurologic: Denies focal weakness or numbness, change in vision Psychiatry: Denies depression or anxiety. Hematologic: no easy bruising or bleeding  All other systems reviewed and found to be negative.  Past Medical History  Diagnosis Date  . Diabetes mellitus   . Hypertension   . Depression   . Fibromyalgia     Past Surgical History  Procedure Laterality Date  . Abdominal hysterectomy    . Breast surgery    . Cardiac catheterization      1995    Social History: does not smoke or drink alcohol Lives at home    Allergies  Allergen Reactions  . Ace Inhibitors Other (See Comments)    Dizzy headaches, crawling feeling inside of arm  . Procardia [Nifedipine] Palpitations    "heart attack symptoms"    Family history:    Family History  Problem Relation Age of Onset  . Hypertension Mother   . Diabetes Mother   . Stroke Mother   . Hypertension Other   . Diabetes Other       Prior to Admission medications   Medication Sig Start Date End Date Taking? Authorizing Provider  amLODipine (NORVASC) 10 MG tablet Take 10 mg by mouth daily. 06/03/11  Yes Azalia Bilis, MD  atenolol-chlorthalidone (TENORETIC) 50-25 MG per tablet Take 1 tablet by mouth every morning. 04/06/14  Yes Historical Provider, MD  buPROPion (WELLBUTRIN SR) 150 MG 12 hr tablet Take 150 mg by mouth 2 (two) times daily. 06/03/11  Yes Azalia Bilis, MD  HYDROcodone-acetaminophen [facility]) 10-325 MG per tablet Take 1 tablet by mouth every 6 (six) hours as needed for moderate pain or severe pain.   Yes Historical Provider, MD  KLOR-CON M20 20 MEQ tablet Take 20 mEq by mouth daily. 07/06/14  Yes Historical Provider, MD  levothyroxine (SYNTHROID, LEVOTHROID) 50 MCG tablet Take 50 mcg by mouth daily before breakfast.   Yes Historical Provider, MD  metFORMIN (GLUCOPHAGE) 1000 MG tablet Take 1,000 mg by mouth 2 (two) times daily. 05/11/14  Yes Historical Provider, MD  Misc Natural Products (OSTEO BI-FLEX ADV DOUBLE ST) TABS Take 1 tablet by mouth daily.   Yes Historical Provider, MD  Specialty Vitamins Products (KIDNEY PO) Take 1 tablet by mouth daily.   Yes Historical Provider, MD  venlafaxine XR (EFFEXOR-XR) 75 MG 24 hr capsule Take 75 mg by mouth 3 (three) times daily.  06/03/11  Yes Azalia Bilis, MD  aspirin 81 MG chewable tablet Chew 81 mg by mouth daily as needed for mild pain or moderate pain.     Historical Provider, MD  HYDROcodone-acetaminophen (NORCO/VICODIN) 5-325 MG per tablet Take 1 tablet by mouth every 6 (six) hours as needed for moderate pain. Patient not taking: Reported on 08/20/2014 05/21/14   Elwin Mocha, MD     Physical Exam: Filed Vitals:   08/20/14 1246 08/20/14 1611 08/20/14 1635 08/20/14 1700  BP: 141/81 122/67  116/61  Pulse: 108 91     Temp: 97.6 F (36.4 C)     TempSrc: Oral     Resp: 21 18 19 13   SpO2: 97% 100%       General: Awake alert oriented 3 HEENT: Normocephalic and Atraumatic, Mucous membranes pink                PERRLA; EOM intact; No scleral icterus,                 Nares: Patent, Oropharynx: Clear, Fair Dentition                 Neck: FROM, no cervical lymphadenopathy, thyromegaly, carotid bruit or JVD;  Breasts: deferred CHEST WALL: No tenderness  CHEST: Normal respiration, clear to auscultation bilaterally  HEART: Regular rate and rhythm; no murmurs rubs or gallops  BACK: No kyphosis or scoliosis; no CVA tenderness  GI: Positive Bowel Sounds, soft, non-tender; no masses, no organomegaly Rectal Exam: deferred MSK: No cyanosis, clubbing, or edema Genitalia: not examined  SKIN:  no rash or ulceration  CNS: Alert and Oriented x 4, Nonfocal exam, CN 2-12 intact  Labs on Admission:  Basic Metabolic Panel:  Recent Labs Lab 08/20/14 1324  NA 138  K 3.4*  CL 103  CO2 28  GLUCOSE 184*  BUN 23*  CREATININE 1.38*  CALCIUM 9.3   Liver Function Tests: No results for input(s): AST, ALT, ALKPHOS, BILITOT, PROT, ALBUMIN in the last 168 hours. No results for input(s): LIPASE, AMYLASE in the last 168 hours. No results for input(s): AMMONIA in the last 168 hours. CBC:  Recent Labs Lab 08/20/14 1324  WBC 7.7  HGB 12.0  HCT 38.4  MCV 82.8  PLT 317   Cardiac Enzymes: No results for input(s): CKTOTAL, CKMB, CKMBINDEX, TROPONINI in the last 168 hours.  BNP (last 3 results) No results for input(s): BNP in the last 8760 hours.  ProBNP (last 3 results) No results for input(s): PROBNP in the last 8760 hours.  CBG: No results for input(s): GLUCAP in the last 168 hours.  Radiological Exams on Admission: Dg Chest 2 View  08/20/2014   CLINICAL DATA:  Three-week history of shortness of breath and left-sided chest pain  EXAM: CHEST  2 VIEW  COMPARISON:  Chest radiograph November 10, 2013 ; chest CT  May 06, 2013  FINDINGS: Lungs are clear. The heart size and pulmonary vascularity are normal. No adenopathy. There is mild degenerative change in the thoracic spine.  IMPRESSION: No edema or consolidation.   Electronically Signed   By: Bretta Bang III M.D.   On: 08/20/2014 14:22    EKG: Independently reviewed. Sinus tachycardia at 10 1 bpm  Assessment/Plan Principal Problem:   Chest pain - EKG unrevealing-we'll check 3 sets of cardiac enzymes-I have asked cardiology to see her and they have told her that if everything looks good, she can go home and have a stress test as an outpatient - most likely  they will arrange this- awaiting official consult note   Active Problems:   HTN (hypertension) - continue atenolol and Norvasc   Hypokalemia  -she takes potassium at home but potassium is low therefore will replace     Hypothyroidism -Continue Synthroid     Diabetes mellitus -hold metformin in case she needs a cardiac cath tomorrow     ARF (acute renal failure) - last creatinine was 1 year ago and was 0.9- hold chlorthalidone-recheck tomorrow    Consulted:  cardiology   Code Status: full code   Family Communication:   DVT Prophylaxi Lovenox   Time spent: 45 min  Teandra Harlan, MD [facility]  If 7PM-7AM, please contact night-coverage [email or web address] 08/20/2014, 5:41 PM

## 2014-08-20 NOTE — Consult Note (Signed)
CARDIOLOGY CONSULT NOTE       Patient ID: Stacey Fletcher MRN: [id number] DOB/AGE: [birth year] 64 y.o.  Admit date: 08/20/2014 Referring Physician:  Butler Denmark Primary Physician: No PCP Per Patient Primary Cardiologist:  New Reason for Consultation:  Chest pain  Principal Problem:   Chest pain Active Problems:   HTN (hypertension)   Hypothyroidism   Diabetes mellitus   ARF (acute renal failure)   HPI:  64 y.o. obese black female with 3 weeks of atypical chest pain. Resting.  Sharp.  Intermittent throughout day. Not always exertional.  Activity limited by right knee pain.  Radiation to left arm No history of CAD.  She complained of sharp shooting pains going down her right leg as well Mild exertional dyspnea.  Pain not pleuritic no GI overtones Has not had ETT.  CRF;s previous smoker quit 1.5 years ago, DM and HTN.     ROS All other systems reviewed and negative except as noted above  Past Medical History  Diagnosis Date  . Diabetes mellitus   . Hypertension   . Coronary artery disease   . Depression   . Fibromyalgia   . MI (myocardial infarction)     Family History  Problem Relation Age of Onset  . Hypertension Mother   . Diabetes Mother   . Stroke Mother   . Hypertension Other   . Diabetes Other     History   Social History  . Marital Status: Divorced    Spouse Name: N/A  . Number of Children: N/A  . Years of Education: N/A   Occupational History  . Not on file.   Social History Main Topics  . Smoking status: Former Smoker -- 0.25 packs/day    Types: Cigarettes    Quit date: 04/20/2014  . Smokeless tobacco: Never Used  . Alcohol Use: No  . Drug Use: No  . Sexual Activity: Not Currently   Other Topics Concern  . Not on file   Social History Narrative    Past Surgical History  Procedure Laterality Date  . Abdominal hysterectomy    . Breast surgery    . Cardiac catheterization      1995     . [START ON 08/21/2014] amLODipine  10 mg Oral Daily    . aspirin  324 mg Oral NOW   Or  . aspirin  300 mg Rectal NOW  . [START ON 08/21/2014] aspirin EC  81 mg Oral Daily  . [START ON 08/21/2014] atenolol  50 mg Oral Daily  . buPROPion  150 mg Oral BID  . enoxaparin (LOVENOX) injection  40 mg Subcutaneous Q24H  . [START ON 08/21/2014] levothyroxine  50 mcg Oral QAC breakfast  . potassium chloride SA  20 mEq Oral Daily  . potassium chloride  40 mEq Oral Once  . sodium chloride  3 mL Intravenous Q12H  . venlafaxine  75 mg Oral TID      Physical Exam: Blood pressure 125/59, pulse 93, temperature 98.1 F (36.7 C), temperature source Oral, resp. rate 20, SpO2 100 %.    Affect appropriate Obese black female  HEENT: normal Neck supple with no adenopathy JVP normal no bruits no thyromegaly Lungs clear with no wheezing and good diaphragmatic motion Heart:  S1/S2 no murmur, no rub, gallop or click PMI normal Abdomen: benighn, BS positve, no tenderness, no AAA no bruit.  No HSM or HJR Distal pulses intact with no bruits No edema Neuro non-focal Skin warm and dry No muscular weakness   Labs:  Lab Results  Component Value Date   WBC 7.7 08/20/2014   HGB 12.0 08/20/2014   HCT 38.4 08/20/2014   MCV 82.8 08/20/2014   PLT 317 08/20/2014    Recent Labs Lab 08/20/14 1324  NA 138  K 3.4*  CL 103  CO2 28  BUN 23*  CREATININE 1.38*  CALCIUM 9.3  GLUCOSE 184*   Lab Results  Component Value Date   TROPONINI <0.30 05/07/2013    Lab Results  Component Value Date   CHOL 122 03/21/2009   CHOL 186 09/05/2007   Lab Results  Component Value Date   HDL 44 03/21/2009   HDL 57 09/05/2007   Lab Results  Component Value Date   LDLCALC 63 03/21/2009   LDLCALC 113* 09/05/2007   Lab Results  Component Value Date   TRIG 77 03/21/2009   TRIG 82 09/05/2007   Lab Results  Component Value Date   CHOLHDL 2.8 Ratio 03/21/2009   CHOLHDL 3.3 Ratio 09/05/2007   No results found for: LDLDIRECT    Radiology: Dg Chest 2  View  08/20/2014   CLINICAL DATA:  Three-week history of shortness of breath and left-sided chest pain  EXAM: CHEST  2 VIEW  COMPARISON:  Chest radiograph November 10, 2013 ; chest CT May 06, 2013  FINDINGS: Lungs are clear. The heart size and pulmonary vascularity are normal. No adenopathy. There is mild degenerative change in the thoracic spine.  IMPRESSION: No edema or consolidation.   Electronically Signed   By: Bretta Bang III M.D.   On: 08/20/2014 14:22    EKG:  SR rate 101 nonspecific lateral T wave changes   ASSESSMENT AND PLAN:  1) Atypical chest pain.  No acute ECG changes troponin negative.  D/C in am Can have outpatient lexiscan myovue 2 day protocol in our office next week. She cannot walk On treadmill due to knee pain She has a history of fibromyalgia which may be contributing to her pain Consider SSRI 2) HTN:  Continue current meds low sodium diet and weight loss 3) DM:  Discussed low carb diet.  Target hemoglobin A1c is 6.5 or less.  Continue current medications.   SignedCharlton Haws 08/20/2014, 6:12 PM

## 2014-08-20 NOTE — Progress Notes (Signed)
ANTICOAGULATION CONSULT NOTE - Initial Consult  Pharmacy Consult for Enoxaparin Indication: chest pain/ACS, changed to VTE Prophylaxis  Allergies  Allergen Reactions  . Ace Inhibitors Other (See Comments)    Dizzy headaches, crawling feeling inside of arm  . Procardia [Nifedipine] Palpitations    "heart attack symptoms"    Patient Measurements: Height: 5\' 7"  (170.2 cm) Weight: 252 lb (114.306 kg) IBW/kg (Calculated) : 61.6  Vital Signs: Temp: 98.1 F (36.7 C) (07/15 1806) Temp Source: Oral (07/15 1806) BP: 125/59 mmHg (07/15 1806) Pulse Rate: 93 (07/15 1806)  Labs:  Recent Labs  08/20/14 1324  HGB 12.0  HCT 38.4  PLT 317  CREATININE 1.38*    CrCl cannot be calculated (Unknown ideal weight.).   Medical History: Past Medical History  Diagnosis Date  . Diabetes mellitus   . Hypertension   . Coronary artery disease   . Depression   . Fibromyalgia   . MI (myocardial infarction)     Medications:  Scheduled:  . [START ON 08/21/2014] amLODipine  10 mg Oral Daily  . aspirin  324 mg Oral NOW   Or  . aspirin  300 mg Rectal NOW  . [START ON 08/21/2014] aspirin EC  81 mg Oral Daily  . [START ON 08/21/2014] atenolol  50 mg Oral Daily  . buPROPion  150 mg Oral BID  . [START ON 08/21/2014] levothyroxine  50 mcg Oral QAC breakfast  . potassium chloride SA  20 mEq Oral Daily  . potassium chloride  40 mEq Oral Once  . sodium chloride  3 mL Intravenous Q12H  . venlafaxine  75 mg Oral TID   Infusions:    Assessment: 59 yoF presented to ED on 7/15 with left-sided chest pain x3 weeks.  EKG is unrevealing.  Pharmacy is initially consulted to dose Lovenox for ACS and chest pain prior to cardiology consult.  Cards consult without mention of anticoagulation and has plan for discharge in AM and outpatient Lexiscan next week.  Attempted to reach TRH x2 without response to clarify dosing goals.  Dr. Anne Fu with cardiology agrees with plan for pharmacy should dose Lovenox for VTE  prophylaxis at this time.   Troponin POC: 0.00 Troponin: < 0.03 SCr 1.09, CrCl ~ 67 ml/min CBC: Hgb 12, Plt 317 Weight 114.3 kg with BMI = 39   Goal of Therapy:  VTE prophylaxis Monitor platelets by anticoagulation protocol: Yes   Plan:   Lovenox 0.5 mg/kg (55mg ) SQ q24h  Pharmacy to s/o note writing, but will f/u peripherally   Lynann Beaver PharmD, BCPS Pager [phone] 08/20/2014 6:36 PM

## 2014-08-20 NOTE — ED Notes (Signed)
md at bedside  Pt alert and oriented x4. Respirations even and unlabored, bilateral symmetrical rise and fall of chest. Skin warm and dry. In no acute distress. Denies needs.   

## 2014-08-21 ENCOUNTER — Other Ambulatory Visit: Payer: Self-pay | Admitting: Internal Medicine

## 2014-08-21 DIAGNOSIS — I10 Essential (primary) hypertension: Secondary | ICD-10-CM | POA: Diagnosis not present

## 2014-08-21 DIAGNOSIS — E038 Other specified hypothyroidism: Secondary | ICD-10-CM | POA: Diagnosis not present

## 2014-08-21 DIAGNOSIS — R072 Precordial pain: Secondary | ICD-10-CM

## 2014-08-21 DIAGNOSIS — R079 Chest pain, unspecified: Secondary | ICD-10-CM | POA: Diagnosis not present

## 2014-08-21 DIAGNOSIS — N179 Acute kidney failure, unspecified: Secondary | ICD-10-CM | POA: Diagnosis not present

## 2014-08-21 LAB — COMPREHENSIVE METABOLIC PANEL
ALT: 33 U/L (ref 14–54)
AST: 32 U/L (ref 15–41)
Albumin: 3.8 g/dL (ref 3.5–5.0)
Alkaline Phosphatase: 80 U/L (ref 38–126)
Anion gap: 6 (ref 5–15)
BUN: 22 mg/dL — ABNORMAL HIGH (ref 6–20)
CO2: 31 mmol/L (ref 22–32)
Calcium: 9.6 mg/dL (ref 8.9–10.3)
Chloride: 103 mmol/L (ref 101–111)
Creatinine, Ser: 1.18 mg/dL — ABNORMAL HIGH (ref 0.44–1.00)
GFR calc Af Amer: 55 mL/min — ABNORMAL LOW (ref >60)
GFR calc non Af Amer: 47 mL/min — ABNORMAL LOW (ref >60)
Glucose, Bld: 134 mg/dL — ABNORMAL HIGH (ref 65–99)
Potassium: 3.7 mmol/L (ref 3.5–5.1)
Sodium: 140 mmol/L (ref 135–145)
Total Bilirubin: 0.5 mg/dL (ref 0.3–1.2)
Total Protein: 6.8 g/dL (ref 6.5–8.1)

## 2014-08-21 LAB — GLUCOSE, CAPILLARY: Glucose-Capillary: 136 mg/dL — ABNORMAL HIGH (ref 65–99)

## 2014-08-21 LAB — TROPONIN I
Troponin I: <0.03 ng/mL (ref <0.031)
Troponin I: <0.03 ng/mL (ref <0.031)

## 2014-08-21 MED ORDER — ENOXAPARIN SODIUM 60 MG/0.6ML ~~LOC~~ SOLN
55.0000 mg | SUBCUTANEOUS | Status: DC
  Administered 2014-08-21: 55 mg via SUBCUTANEOUS
  Filled 2014-08-21: qty 0.6

## 2014-08-21 MED ORDER — METFORMIN HCL 500 MG PO TABS
1000.0000 mg | ORAL_TABLET | Freq: Two times a day (BID) | ORAL | Status: DC
  Administered 2014-08-21: 1000 mg via ORAL

## 2014-08-21 NOTE — Progress Notes (Signed)
Outpatient stress test per Dr. Eden Emms - follow-up appointment with him.

## 2014-08-21 NOTE — Discharge Summary (Signed)
Physician Discharge Summary  Zenith Diss ZOX:[id number] DOB: 07/14/1950 DOA: 08/20/2014  PCP: Quitman Livings, MD  Admit date: 08/20/2014 Discharge date: 08/21/2014  Time spent: 50 minutes  Recommendations for Outpatient Follow-up:  1. Recheck metabolic panel in 1-2 weeks  Discharge Condition: Stable Diet recommendation: Low sodium heart healthy diabetic  Discharge Diagnoses:  Principal Problem:   Chest pain Active Problems:   HTN (hypertension)   Hypothyroidism   Diabetes mellitus   ARF (acute renal failure)   History of present illness:  Stacey Fletcher is a 64 y.o. female with diabetes mellitus, hypertension who presents with a complaint of left-sided chest pain. She's been having it for 3 weeks on and off. She cannot relate it to exertion as it occurs at rest as well. She cannot relate it to food intake. It's present on the left side of her chest and radiates around to her left arm occasionally has caused some dizziness and nausea. No diaphoresis and no palpitations. It resolves on its own. A few times it is lasted close to 30 minutes but usually is less than this. She's had 3 episodes the day of admission last one being while she was in the ER.  Hospital Course:  Principal Problem:  Chest pain - EKG unrevealing- 3 sets of cardiac enzymes negative- -cardiology has been consult to- they recommend a stress test as an outpatient - they will arrange this-  Active Problems:  HTN (hypertension) - continue atenolol and Norvasc  Hypokalemia  -she takes potassium at home but potassium was low therefore will replaced   Hypothyroidism -Continue Synthroid    Diabetes mellitus -hold metformin in case she needs a cardiac cath tomorrow    ARF (acute renal failure) - Compared to one year ago, creatinine has increased from 0.9 to about 1.4-commend follow-up as outpatient   Consultations:  Cardiology  Discharge Exam: Filed Weights   08/20/14 1846  Weight: 114.306 kg  (252 lb)   Filed Vitals:   08/21/14 0519  BP: 140/77  Pulse: 92  Temp: 98 F (36.7 C)  Resp: 18    General: AAO x 3, no distress Cardiovascular: RRR, no murmurs  Respiratory: clear to auscultation bilaterally GI: soft, non-tender, non-distended, bowel sound positive  Discharge Instructions You were cared for by a hospitalist during your hospital stay. If you have any questions about your discharge medications or the care you received while you were in the hospital after you are discharged, you can call the unit and asked to speak with the hospitalist on call if the hospitalist that took care of you is not available. Once you are discharged, your primary care physician will handle any further medical issues. Please note that NO REFILLS for any discharge medications will be authorized once you are discharged, as it is imperative that you return to your primary care physician (or establish a relationship with a primary care physician if you do not have one) for your aftercare needs so that they can reassess your need for medications and monitor your lab values.  Discharge Instructions    Discharge instructions    Complete by:  As directed   Diabetic, heart healthy diet     Increase activity slowly    Complete by:  As directed             Medication List    TAKE these medications        amLODipine 10 MG tablet  Commonly known as:  NORVASC  Take 10 mg by mouth daily.  aspirin 81 MG chewable tablet  Chew 81 mg by mouth daily as needed for mild pain or moderate pain.     atenolol-chlorthalidone 50-25 MG per tablet  Commonly known as:  TENORETIC  Take 1 tablet by mouth every morning.     buPROPion 150 MG 12 hr tablet  Commonly known as:  WELLBUTRIN SR  Take 150 mg by mouth 2 (two) times daily.     HYDROcodone-acetaminophen 10-325 MG per tablet  Commonly known as:  NORCO  Take 1 tablet by mouth every 6 (six) hours as needed for moderate pain or severe pain.     KIDNEY PO   Take 1 tablet by mouth daily.     KLOR-CON M20 20 MEQ tablet  Generic drug:  potassium chloride SA  Take 20 mEq by mouth daily.     levothyroxine 50 MCG tablet  Commonly known as:  SYNTHROID, LEVOTHROID  Take 50 mcg by mouth daily before breakfast.     metFORMIN 1000 MG tablet  Commonly known as:  GLUCOPHAGE  Take 1,000 mg by mouth 2 (two) times daily.     OSTEO BI-FLEX ADV DOUBLE ST Tabs  Take 1 tablet by mouth daily.     venlafaxine 75 MG tablet  Commonly known as:  EFFEXOR  Take 75 mg by mouth 3 (three) times daily with meals.       Allergies  Allergen Reactions  . Ace Inhibitors Other (See Comments)    Dizzy headaches, crawling feeling inside of arm  . Procardia [Nifedipine] Palpitations    "heart attack symptoms"       Follow-up Information    Follow up with [facility]. Schedule an appointment as soon as possible for a visit in 1 week.   Why:  We will contact you for an outpatient stress test and follow-up appointment   Contact information:   [address] [place] [address] [phone]       The results of significant diagnostics from this hospitalization (including imaging, microbiology, ancillary and laboratory) are listed below for reference.    Significant Diagnostic Studies: Dg Chest 2 View  08/20/2014   CLINICAL DATA:  Three-week history of shortness of breath and left-sided chest pain  EXAM: CHEST  2 VIEW  COMPARISON:  Chest radiograph November 10, 2013 ; chest CT May 06, 2013  FINDINGS: Lungs are clear. The heart size and pulmonary vascularity are normal. No adenopathy. There is mild degenerative change in the thoracic spine.  IMPRESSION: No edema or consolidation.   Electronically Signed   By: Bretta Bang III M.D.   On: 08/20/2014 14:22    Microbiology: No results found for this or any previous visit (from the past 240 hour(s)).   Labs: Basic Metabolic  Panel:  Recent Labs Lab 08/20/14 1324 08/20/14 1833 08/20/14 2350  NA 138  --  140  K 3.4*  --  3.7  CL 103  --  103  CO2 28  --  31  GLUCOSE 184*  --  134*  BUN 23*  --  22*  CREATININE 1.38* 1.09* 1.18*  CALCIUM 9.3  --  9.6   Liver Function Tests:  Recent Labs Lab 08/20/14 2350  AST 32  ALT 33  ALKPHOS 80  BILITOT 0.5  PROT 6.8  ALBUMIN 3.8   No results for input(s): LIPASE, AMYLASE in the last 168 hours. No results for input(s): AMMONIA in the last 168 hours. CBC:  Recent Labs Lab 08/20/14  1324 08/20/14 1833  WBC 7.7 8.7  HGB 12.0 11.7*  HCT 38.4 37.0  MCV 82.8 83.3  PLT 317 291   Cardiac Enzymes:  Recent Labs Lab 08/20/14 1833 08/20/14 2349 08/21/14 0545  TROPONINI <0.03 <0.03 <0.03   BNP: BNP (last 3 results) No results for input(s): BNP in the last 8760 hours.  ProBNP (last 3 results) No results for input(s): PROBNP in the last 8760 hours.  CBG:  Recent Labs Lab 08/21/14 0813  GLUCAP 136*       SignedCalvert Cantor, MD [facility] 08/21/2014, 11:57 AM

## 2014-08-21 NOTE — Progress Notes (Signed)
Troponin negative x 3. Reviewed Dr. Fabio Bering note and agree with plan for outpatient myoview in our office next week (ordered) and follow-up with him.  Cardiology will sign-off. Call with questions.  Chrystie Nose, MD, [facility] Attending Cardiologist [facility]

## 2014-10-14 ENCOUNTER — Other Ambulatory Visit (HOSPITAL_COMMUNITY): Payer: Self-pay | Admitting: Internal Medicine

## 2014-10-14 DIAGNOSIS — Z1231 Encounter for screening mammogram for malignant neoplasm of breast: Secondary | ICD-10-CM

## 2014-10-28 ENCOUNTER — Ambulatory Visit (HOSPITAL_COMMUNITY)
Admission: RE | Admit: 2014-10-28 | Discharge: 2014-10-28 | Disposition: A | Discharge status: Home / Self Care | Payer: Medicare Other | Source: Ambulatory Visit | Attending: Internal Medicine | Admitting: Internal Medicine

## 2014-10-28 DIAGNOSIS — Z1231 Encounter for screening mammogram for malignant neoplasm of breast: Secondary | ICD-10-CM | POA: Insufficient documentation

## 2014-11-02 ENCOUNTER — Other Ambulatory Visit: Payer: Self-pay | Admitting: Internal Medicine

## 2014-11-02 DIAGNOSIS — R928 Other abnormal and inconclusive findings on diagnostic imaging of breast: Secondary | ICD-10-CM

## 2014-11-09 ENCOUNTER — Other Ambulatory Visit: Payer: Medicare Other

## 2015-03-11 ENCOUNTER — Encounter (HOSPITAL_COMMUNITY): Payer: Self-pay

## 2015-03-11 ENCOUNTER — Emergency Department (HOSPITAL_COMMUNITY)
Admission: EM | Admit: 2015-03-11 | Discharge: 2015-03-11 | Disposition: A | Discharge status: Home / Self Care | Payer: Medicare Other | Attending: Emergency Medicine | Admitting: Emergency Medicine

## 2015-03-11 ENCOUNTER — Emergency Department (HOSPITAL_COMMUNITY): Payer: Medicare Other

## 2015-03-11 DIAGNOSIS — Z7982 Long term (current) use of aspirin: Secondary | ICD-10-CM | POA: Insufficient documentation

## 2015-03-11 DIAGNOSIS — Z9889 Other specified postprocedural states: Secondary | ICD-10-CM | POA: Diagnosis not present

## 2015-03-11 DIAGNOSIS — F329 Major depressive disorder, single episode, unspecified: Secondary | ICD-10-CM | POA: Insufficient documentation

## 2015-03-11 DIAGNOSIS — Z87891 Personal history of nicotine dependence: Secondary | ICD-10-CM | POA: Insufficient documentation

## 2015-03-11 DIAGNOSIS — Y9389 Activity, other specified: Secondary | ICD-10-CM | POA: Insufficient documentation

## 2015-03-11 DIAGNOSIS — Z79899 Other long term (current) drug therapy: Secondary | ICD-10-CM | POA: Insufficient documentation

## 2015-03-11 DIAGNOSIS — M797 Fibromyalgia: Secondary | ICD-10-CM | POA: Insufficient documentation

## 2015-03-11 DIAGNOSIS — Y9289 Other specified places as the place of occurrence of the external cause: Secondary | ICD-10-CM | POA: Insufficient documentation

## 2015-03-11 DIAGNOSIS — I10 Essential (primary) hypertension: Secondary | ICD-10-CM | POA: Diagnosis not present

## 2015-03-11 DIAGNOSIS — S0093XA Contusion of unspecified part of head, initial encounter: Secondary | ICD-10-CM | POA: Insufficient documentation

## 2015-03-11 DIAGNOSIS — Z7984 Long term (current) use of oral hypoglycemic drugs: Secondary | ICD-10-CM | POA: Diagnosis not present

## 2015-03-11 DIAGNOSIS — I2510 Atherosclerotic heart disease of native coronary artery without angina pectoris: Secondary | ICD-10-CM | POA: Diagnosis not present

## 2015-03-11 DIAGNOSIS — E119 Type 2 diabetes mellitus without complications: Secondary | ICD-10-CM | POA: Diagnosis not present

## 2015-03-11 DIAGNOSIS — W01198A Fall on same level from slipping, tripping and stumbling with subsequent striking against other object, initial encounter: Secondary | ICD-10-CM | POA: Diagnosis not present

## 2015-03-11 DIAGNOSIS — Y998 Other external cause status: Secondary | ICD-10-CM | POA: Insufficient documentation

## 2015-03-11 DIAGNOSIS — I252 Old myocardial infarction: Secondary | ICD-10-CM | POA: Insufficient documentation

## 2015-03-11 DIAGNOSIS — S0990XA Unspecified injury of head, initial encounter: Secondary | ICD-10-CM | POA: Diagnosis present

## 2015-03-11 NOTE — ED Notes (Signed)
Pt states fell 3 hours ago.  Struck her head on corner of door.  Pt did not have LOC. Has swelling on top of head.  Pt denies nausea.  Pt denies dizziness prior.  Feels like she may have tripped.  Pt having generalized pain post fall.

## 2015-03-11 NOTE — ED Notes (Signed)
Pt report she was coming out of the laundry room when she fell face down.  Denies feeling dizzy prior to fall.  Pt denies LOC. Pt is A&Ox 4.  Hematoma noted to L side of her head.

## 2015-03-11 NOTE — Discharge Instructions (Signed)

## 2015-03-12 NOTE — ED Provider Notes (Signed)
CSN: [id number]     Arrival date & time 03/11/15  1536 History   First MD Initiated Contact with Patient 03/11/15 1549     Chief Complaint  Patient presents with  . Fall  . Headache     (Consider location/radiation/quality/duration/timing/severity/associated sxs/prior Treatment) HPI Patient presents to the emergency department with headache following a fall that occurred earlier today.  The patient states she tripped while going into her laundry room and hit the edge of the door frame, states she has a headache in the area where she struck her head.  The patient states she did take a hydrocodone prior to arrival for symptoms.  Patient denies nausea, vomiting, blurred vision, back pain, neck pain, chest pain, shortness of breath, abdominal pain, weakness, dizziness, lightheadedness, near syncope or syncope.  The patient states that he landed on her knees, but does not have significant pain in that area Past Medical History  Diagnosis Date  . Diabetes mellitus   . Hypertension   . Coronary artery disease   . Depression   . Fibromyalgia   . MI (myocardial infarction) [facility]    Past Surgical History  Procedure Laterality Date  . Abdominal hysterectomy    . Breast surgery    . Cardiac catheterization      1995   Family History  Problem Relation Age of Onset  . Hypertension Mother   . Diabetes Mother   . Stroke Mother   . Hypertension Other   . Diabetes Other    Social History  Substance Use Topics  . Smoking status: Former Smoker -- 0.25 packs/day    Types: Cigarettes    Quit date: 04/20/2014  . Smokeless tobacco: Never Used  . Alcohol Use: No   OB History    No data available     Review of Systems All other systems negative except as documented in the HPI. All pertinent positives and negatives as reviewed in the HPI.   Allergies  Ace inhibitors and Procardia  Home Medications   Prior to Admission medications   Medication Sig Start Date End Date Taking? Authorizing  Provider  amLODipine (NORVASC) 10 MG tablet Take 10 mg by mouth daily. 06/03/11  Yes Azalia Bilis, MD  aspirin 81 MG chewable tablet Chew 81 mg by mouth daily as needed for mild pain or moderate pain.    Yes Historical Provider, MD  atenolol-chlorthalidone (TENORETIC) 50-25 MG per tablet Take 1 tablet by mouth every morning. 04/06/14  Yes Historical Provider, MD  buPROPion (WELLBUTRIN SR) 150 MG 12 hr tablet Take 150 mg by mouth 2 (two) times daily. 06/03/11  Yes Azalia Bilis, MD  HYDROcodone-acetaminophen [facility]) 10-325 MG per tablet Take 1 tablet by mouth every 6 (six) hours as needed for moderate pain or severe pain.   Yes Historical Provider, MD  KLOR-CON M20 20 MEQ tablet Take 20 mEq by mouth daily. 07/06/14  Yes Historical Provider, MD  levothyroxine (SYNTHROID, LEVOTHROID) 50 MCG tablet Take 50 mcg by mouth daily before breakfast.   Yes Historical Provider, MD  metFORMIN (GLUCOPHAGE) 1000 MG tablet Take 1,000 mg by mouth 2 (two) times daily. 05/11/14  Yes Historical Provider, MD  Specialty Vitamins Products (KIDNEY PO) Take 1 tablet by mouth 3 (three) times daily. Kidney factors   Yes Historical Provider, MD  venlafaxine (EFFEXOR) 75 MG tablet Take 75 mg by mouth 3 (three) times daily with meals.   Yes Historical Provider, MD   BP 136/89 mmHg  Pulse 103  Temp(Src) 98.7 F (37.1  C) (Oral)  Resp 20  SpO2 98% Physical Exam  Constitutional: She is oriented to person, place, and time. She appears well-developed and well-nourished. No distress.  HENT:  Head: Normocephalic and atraumatic.  Mouth/Throat: Oropharynx is clear and moist.  Eyes: Pupils are equal, round, and reactive to light.  Neck: Normal range of motion. Neck supple.  Cardiovascular: Normal rate, regular rhythm and normal heart sounds.  Exam reveals no gallop and no friction rub.   No murmur heard. Pulmonary/Chest: Effort normal and breath sounds normal. No respiratory distress. She has no wheezes.  Neurological: She is alert and  oriented to person, place, and time. She exhibits normal muscle tone. Coordination normal.  Skin: Skin is warm and dry. No rash noted. No erythema.  Psychiatric: She has a normal mood and affect. Her behavior is normal.  Nursing note and vitals reviewed.   ED Course  Procedures (including critical care time) Labs Review Labs Reviewed - No data to display  Imaging Review Ct Head Wo Contrast  03/11/2015  CLINICAL DATA:  Pt states fell 3 hours ago. Struck her head on corner of door. Pt did not have LOC. Has swelling on top of head. Pt denies nausea. EXAM: CT HEAD WITHOUT CONTRAST CT CERVICAL SPINE WITHOUT CONTRAST TECHNIQUE: Multidetector CT imaging of the head and cervical spine was performed following the standard protocol without intravenous contrast. Multiplanar CT image reconstructions of the cervical spine were also generated. COMPARISON:  06/16/2012 FINDINGS: CT HEAD FINDINGS There is no evidence of mass effect, midline shift, or extra-axial fluid collections. There is no evidence of a space-occupying lesion or intracranial hemorrhage. There is no evidence of a cortical-based area of acute infarction. There is periventricular white matter low attenuation likely secondary to microangiopathy. The ventricles and sulci are appropriate for the patient's age. The basal cisterns are patent. Visualized portions of the orbits are unremarkable. The visualized portions of the paranasal sinuses and mastoid air cells are unremarkable. The osseous structures are unremarkable. There is left scalp soft tissue swelling near the vertex. CT CERVICAL SPINE FINDINGS The alignment is anatomic. The vertebral body heights are maintained. There is no acute fracture. There is no static listhesis. The prevertebral soft tissues are normal. The intraspinal soft tissues are not fully imaged on this examination due to poor soft tissue contrast, but there is no gross soft tissue abnormality. There is degenerative disc disease with  disc height loss at C6-7. There is moderate left facet arthropathy at C4-5. There is moderate left facet arthropathy at C5-6. Mild bilateral uncovertebral degenerative changes at C5-6. The visualized portions of the lung apices demonstrate no focal abnormality. IMPRESSION: 1. No acute intracranial pathology. 2. No acute osseous injury of the cervical spine. Electronically Signed   By: Elige Ko   On: 03/11/2015 16:59   Ct Cervical Spine Wo Contrast  03/11/2015  CLINICAL DATA:  Pt states fell 3 hours ago. Struck her head on corner of door. Pt did not have LOC. Has swelling on top of head. Pt denies nausea. EXAM: CT HEAD WITHOUT CONTRAST CT CERVICAL SPINE WITHOUT CONTRAST TECHNIQUE: Multidetector CT imaging of the head and cervical spine was performed following the standard protocol without intravenous contrast. Multiplanar CT image reconstructions of the cervical spine were also generated. COMPARISON:  06/16/2012 FINDINGS: CT HEAD FINDINGS There is no evidence of mass effect, midline shift, or extra-axial fluid collections. There is no evidence of a space-occupying lesion or intracranial hemorrhage. There is no evidence of a cortical-based area of acute  infarction. There is periventricular white matter low attenuation likely secondary to microangiopathy. The ventricles and sulci are appropriate for the patient's age. The basal cisterns are patent. Visualized portions of the orbits are unremarkable. The visualized portions of the paranasal sinuses and mastoid air cells are unremarkable. The osseous structures are unremarkable. There is left scalp soft tissue swelling near the vertex. CT CERVICAL SPINE FINDINGS The alignment is anatomic. The vertebral body heights are maintained. There is no acute fracture. There is no static listhesis. The prevertebral soft tissues are normal. The intraspinal soft tissues are not fully imaged on this examination due to poor soft tissue contrast, but there is no gross soft tissue  abnormality. There is degenerative disc disease with disc height loss at C6-7. There is moderate left facet arthropathy at C4-5. There is moderate left facet arthropathy at C5-6. Mild bilateral uncovertebral degenerative changes at C5-6. The visualized portions of the lung apices demonstrate no focal abnormality. IMPRESSION: 1. No acute intracranial pathology. 2. No acute osseous injury of the cervical spine. Electronically Signed   By: Elige Ko   On: 03/11/2015 16:59   I have personally reviewed and evaluated these images and lab results as part of my medical decision-making.   EKG Interpretation None      Patient will be discharged home, treated for minor head injury.  Told to return here as needed.  Follow up with her primary care doctor.  Patient agrees plan and all questions.  Patient has no neurological deficits noted on exam.  She is advised to use ice on the area that is sore   Charlestine Night, PA-C 03/12/15 0045  Gwyneth Sprout, MD 03/12/15 1121

## 2015-05-27 ENCOUNTER — Emergency Department (HOSPITAL_COMMUNITY): Payer: Medicare Other

## 2015-05-27 ENCOUNTER — Encounter (HOSPITAL_COMMUNITY): Payer: Self-pay | Admitting: Emergency Medicine

## 2015-05-27 ENCOUNTER — Emergency Department (HOSPITAL_COMMUNITY)
Admission: EM | Admit: 2015-05-27 | Discharge: 2015-05-27 | Disposition: A | Discharge status: Home / Self Care | Payer: Medicare Other | Attending: Emergency Medicine | Admitting: Emergency Medicine

## 2015-05-27 DIAGNOSIS — E871 Hypo-osmolality and hyponatremia: Secondary | ICD-10-CM | POA: Diagnosis not present

## 2015-05-27 DIAGNOSIS — J069 Acute upper respiratory infection, unspecified: Secondary | ICD-10-CM | POA: Diagnosis present

## 2015-05-27 DIAGNOSIS — E119 Type 2 diabetes mellitus without complications: Secondary | ICD-10-CM | POA: Insufficient documentation

## 2015-05-27 DIAGNOSIS — Z79899 Other long term (current) drug therapy: Secondary | ICD-10-CM | POA: Diagnosis not present

## 2015-05-27 DIAGNOSIS — J40 Bronchitis, not specified as acute or chronic: Secondary | ICD-10-CM

## 2015-05-27 DIAGNOSIS — F329 Major depressive disorder, single episode, unspecified: Secondary | ICD-10-CM | POA: Diagnosis not present

## 2015-05-27 DIAGNOSIS — I2510 Atherosclerotic heart disease of native coronary artery without angina pectoris: Secondary | ICD-10-CM | POA: Diagnosis not present

## 2015-05-27 DIAGNOSIS — I10 Essential (primary) hypertension: Secondary | ICD-10-CM | POA: Insufficient documentation

## 2015-05-27 DIAGNOSIS — Z7982 Long term (current) use of aspirin: Secondary | ICD-10-CM | POA: Insufficient documentation

## 2015-05-27 DIAGNOSIS — Z7984 Long term (current) use of oral hypoglycemic drugs: Secondary | ICD-10-CM | POA: Diagnosis not present

## 2015-05-27 DIAGNOSIS — I252 Old myocardial infarction: Secondary | ICD-10-CM | POA: Insufficient documentation

## 2015-05-27 DIAGNOSIS — Z79891 Long term (current) use of opiate analgesic: Secondary | ICD-10-CM | POA: Insufficient documentation

## 2015-05-27 DIAGNOSIS — Z87891 Personal history of nicotine dependence: Secondary | ICD-10-CM | POA: Diagnosis not present

## 2015-05-27 LAB — CBG MONITORING, ED: Glucose-Capillary: 106 mg/dL — ABNORMAL HIGH (ref 65–99)

## 2015-05-27 MED ORDER — ALBUTEROL SULFATE HFA 108 (90 BASE) MCG/ACT IN AERS: 2.0000 | INHALATION_SPRAY | Freq: Four times a day (QID) | RESPIRATORY_TRACT | Status: DC | PRN

## 2015-05-27 MED ORDER — PREDNISONE 50 MG PO TABS: ORAL_TABLET | ORAL | Status: DC

## 2015-05-27 NOTE — ED Provider Notes (Signed)
CSN: [id number]     Arrival date & time 05/27/15  1342 History   First MD Initiated Contact with Patient 05/27/15 1622     Chief Complaint  Patient presents with  . URI     (Consider location/radiation/quality/duration/timing/severity/associated sxs/prior Treatment) HPI Comments: Symptoms are worse at night and cough is been nonproductive. Denies any anginal or CHF symptoms. No vomiting or diarrhea. Denies any headache or neck pain. Nothing makes the symptoms worse.  Patient is a 65 y.o. female presenting with URI. The history is provided by the patient.  URI Presenting symptoms: congestion   Severity:  Moderate Onset quality:  Sudden Duration:  3 weeks Timing:  Constant Progression:  Worsening Chronicity:  New Relieved by:  OTC medications Worsened by:  Nothing tried Ineffective treatments:  OTC medications Associated symptoms: myalgias and sinus pain   Associated symptoms: no wheezing     Past Medical History  Diagnosis Date  . Diabetes mellitus   . Hypertension   . Coronary artery disease   . Depression   . Fibromyalgia   . MI (myocardial infarction) [facility])    Past Surgical History  Procedure Laterality Date  . Abdominal hysterectomy    . Breast surgery    . Cardiac catheterization      1995   Family History  Problem Relation Age of Onset  . Hypertension Mother   . Diabetes Mother   . Stroke Mother   . Hypertension Other   . Diabetes Other    Social History  Substance Use Topics  . Smoking status: Former Smoker -- 0.25 packs/day    Types: Cigarettes    Quit date: 04/20/2014  . Smokeless tobacco: Never Used  . Alcohol Use: No   OB History    No data available     Review of Systems  HENT: Positive for congestion.   Respiratory: Negative for wheezing.   Musculoskeletal: Positive for myalgias.  All other systems reviewed and are negative.     Allergies  Ace inhibitors and Procardia  Home Medications   Prior to Admission medications    Medication Sig Start Date End Date Taking? Authorizing Provider  amLODipine (NORVASC) 10 MG tablet Take 10 mg by mouth daily. 06/03/11   Azalia Bilis, MD  aspirin 81 MG chewable tablet Chew 81 mg by mouth daily as needed for mild pain or moderate pain.     Historical Provider, MD  atenolol-chlorthalidone (TENORETIC) 50-25 MG per tablet Take 1 tablet by mouth every morning. 04/06/14   Historical Provider, MD  buPROPion (WELLBUTRIN SR) 150 MG 12 hr tablet Take 150 mg by mouth 2 (two) times daily. 06/03/11   Azalia Bilis, MD  HYDROcodone-acetaminophen [facility]) 10-325 MG per tablet Take 1 tablet by mouth every 6 (six) hours as needed for moderate pain or severe pain.    Historical Provider, MD  KLOR-CON M20 20 MEQ tablet Take 20 mEq by mouth daily. 07/06/14   Historical Provider, MD  levothyroxine (SYNTHROID, LEVOTHROID) 50 MCG tablet Take 50 mcg by mouth daily before breakfast.    Historical Provider, MD  metFORMIN (GLUCOPHAGE) 1000 MG tablet Take 1,000 mg by mouth 2 (two) times daily. 05/11/14   Historical Provider, MD  Specialty Vitamins Products (KIDNEY PO) Take 1 tablet by mouth 3 (three) times daily. Kidney factors    Historical Provider, MD  venlafaxine (EFFEXOR) 75 MG tablet Take 75 mg by mouth 3 (three) times daily with meals.    Historical Provider, MD   BP 123/88 mmHg  Pulse 87  Temp(Src) 97.9 F (36.6 C) (Oral)  Resp 20  SpO2 98% Physical Exam  Constitutional: She is oriented to person, place, and time. She appears well-developed and well-nourished.  Non-toxic appearance. No distress.  HENT:  Head: Normocephalic and atraumatic.  Eyes: Conjunctivae, EOM and lids are normal. Pupils are equal, round, and reactive to light.  Neck: Normal range of motion. Neck supple. No tracheal deviation present. No thyroid mass present.  Cardiovascular: Normal rate, regular rhythm and normal heart sounds.  Exam reveals no gallop.   No murmur heard. Pulmonary/Chest: Effort normal and breath sounds normal. No  stridor. No respiratory distress. She has no decreased breath sounds. She has no wheezes. She has no rhonchi. She has no rales.  Abdominal: Soft. Normal appearance and bowel sounds are normal. She exhibits no distension. There is no tenderness. There is no rebound and no CVA tenderness.  Musculoskeletal: Normal range of motion. She exhibits no edema or tenderness.  Neurological: She is alert and oriented to person, place, and time. She has normal strength. No cranial nerve deficit or sensory deficit. GCS eye subscore is 4. GCS verbal subscore is 5. GCS motor subscore is 6.  Skin: Skin is warm and dry. No abrasion and no rash noted.  Psychiatric: She has a normal mood and affect. Her speech is normal and behavior is normal.  Nursing note and vitals reviewed.   ED Course  Procedures (including critical care time) Labs Review Labs Reviewed - No data to display  Imaging Review Dg Chest 2 View  05/27/2015  CLINICAL DATA:  Cough for 3 weeks EXAM: CHEST  2 VIEW COMPARISON:  08/20/2014 FINDINGS: The heart size and mediastinal contours are within normal limits. Both lungs are clear. The visualized skeletal structures are unremarkable. IMPRESSION: No active cardiopulmonary disease. Electronically Signed   By: Alcide Clever M.D.   On: 05/27/2015 14:46   I have personally reviewed and evaluated these images and lab results as part of my medical decision-making.   EKG Interpretation None      MDM   Final diagnoses:  None    Patient likely bronchitis and will be placed on medications.    Lorre Nick, MD 05/27/15 [phone]

## 2015-05-27 NOTE — ED Notes (Signed)
Patient discharged. Unable to discharge from computer at this time due to registration being in chart.

## 2015-05-27 NOTE — Discharge Instructions (Signed)

## 2015-05-27 NOTE — ED Notes (Signed)
Per pt, states cols symptoms for 3 weeks-congestion, body aches

## 2015-07-16 ENCOUNTER — Emergency Department (HOSPITAL_COMMUNITY): Payer: Medicare Other

## 2015-07-16 ENCOUNTER — Emergency Department (HOSPITAL_COMMUNITY)
Admission: EM | Admit: 2015-07-16 | Discharge: 2015-07-17 | Disposition: A | Discharge status: Home / Self Care | Payer: Medicare Other | Attending: Emergency Medicine | Admitting: Emergency Medicine

## 2015-07-16 DIAGNOSIS — M542 Cervicalgia: Secondary | ICD-10-CM | POA: Diagnosis not present

## 2015-07-16 DIAGNOSIS — Z7982 Long term (current) use of aspirin: Secondary | ICD-10-CM | POA: Insufficient documentation

## 2015-07-16 DIAGNOSIS — E119 Type 2 diabetes mellitus without complications: Secondary | ICD-10-CM | POA: Insufficient documentation

## 2015-07-16 DIAGNOSIS — I252 Old myocardial infarction: Secondary | ICD-10-CM | POA: Diagnosis not present

## 2015-07-16 DIAGNOSIS — Z87891 Personal history of nicotine dependence: Secondary | ICD-10-CM | POA: Insufficient documentation

## 2015-07-16 DIAGNOSIS — R079 Chest pain, unspecified: Secondary | ICD-10-CM | POA: Diagnosis present

## 2015-07-16 DIAGNOSIS — I10 Essential (primary) hypertension: Secondary | ICD-10-CM | POA: Insufficient documentation

## 2015-07-16 DIAGNOSIS — I2510 Atherosclerotic heart disease of native coronary artery without angina pectoris: Secondary | ICD-10-CM | POA: Diagnosis not present

## 2015-07-16 DIAGNOSIS — F329 Major depressive disorder, single episode, unspecified: Secondary | ICD-10-CM | POA: Diagnosis not present

## 2015-07-16 DIAGNOSIS — Z79899 Other long term (current) drug therapy: Secondary | ICD-10-CM | POA: Diagnosis not present

## 2015-07-16 DIAGNOSIS — Z7984 Long term (current) use of oral hypoglycemic drugs: Secondary | ICD-10-CM | POA: Diagnosis not present

## 2015-07-16 DIAGNOSIS — M791 Myalgia: Secondary | ICD-10-CM | POA: Insufficient documentation

## 2015-07-16 LAB — BASIC METABOLIC PANEL
Anion gap: 8 (ref 5–15)
BUN: 21 mg/dL — ABNORMAL HIGH (ref 6–20)
CO2: 29 mmol/L (ref 22–32)
Calcium: 9.5 mg/dL (ref 8.9–10.3)
Chloride: 102 mmol/L (ref 101–111)
Creatinine, Ser: 1.06 mg/dL — ABNORMAL HIGH (ref 0.44–1.00)
GFR calc Af Amer: >60 mL/min (ref >60)
GFR calc non Af Amer: 53 mL/min — ABNORMAL LOW (ref >60)
Glucose, Bld: 113 mg/dL — ABNORMAL HIGH (ref 65–99)
Potassium: 3.4 mmol/L — ABNORMAL LOW (ref 3.5–5.1)
Sodium: 139 mmol/L (ref 135–145)

## 2015-07-16 LAB — I-STAT TROPONIN, ED: Troponin i, poc: 0 ng/mL (ref 0.00–0.08)

## 2015-07-16 LAB — CBC
HCT: 35.9 % — ABNORMAL LOW (ref 36.0–46.0)
Hemoglobin: 11.9 g/dL — ABNORMAL LOW (ref 12.0–15.0)
MCH: 26.6 pg (ref 26.0–34.0)
MCHC: 33.1 g/dL (ref 30.0–36.0)
MCV: 80.3 fL (ref 78.0–100.0)
Platelets: 293 10*3/uL (ref 150–400)
RBC: 4.47 MIL/uL (ref 3.87–5.11)
RDW: 14.5 % (ref 11.5–15.5)
WBC: 9.7 10*3/uL (ref 4.0–10.5)

## 2015-07-16 NOTE — ED Notes (Signed)
Pt c/o 8/10 left sided cp that radiates down her entire left side, sob, and sweating.Pt denies n/v. Pt is A+OX4, non-diaphoretic, ambulatory, speaking in complete sentences in triage.

## 2015-07-17 MED ORDER — METHOCARBAMOL 500 MG PO TABS: 1000.0000 mg | ORAL_TABLET | Freq: Four times a day (QID) | ORAL | Status: DC

## 2015-07-17 NOTE — ED Provider Notes (Signed)
CSN: [id number]     Arrival date & time 07/16/15  2221 History   First MD Initiated Contact with Patient 07/16/15 2358     Chief Complaint  Patient presents with  . Chest Pain     (Consider location/radiation/quality/duration/timing/severity/associated sxs/prior Treatment) HPI Comments: Patient with reported remote MI, presents with chief complaint of left neck pain with radiation into bilateral arms and legs without weakness. Symptoms started 3 days ago. No injury at onset. Patient had associated constant left-sided chest pain that resolved today. Pain did not radiate and was not associated with activity. Pain is worse with movement of her head and neck as well as left arm. Intermittent SOB reported, none now. Patient is very worried that her blood sugar is too high and that her kidneys are failing. Patient denies warning symptoms of back pain including: fecal incontinence, urinary retention or overflow incontinence, night sweats, waking from sleep with back pain, unexplained fevers or weight loss, h/o cancer, IVDU, recent trauma. The onset of this condition was acute. The course is constant. Alleviating factors: none.     Patient is a 65 y.o. female presenting with chest pain. The history is provided by the patient.  Chest Pain Associated symptoms: shortness of breath   Associated symptoms: no abdominal pain, no back pain, no cough, no diaphoresis, no fever, no headache, no nausea, no palpitations and not vomiting     Past Medical History  Diagnosis Date  . Diabetes mellitus   . Hypertension   . Coronary artery disease   . Depression   . Fibromyalgia   . MI (myocardial infarction) [facility])    Past Surgical History  Procedure Laterality Date  . Abdominal hysterectomy    . Breast surgery    . Cardiac catheterization      1995   Family History  Problem Relation Age of Onset  . Hypertension Mother   . Diabetes Mother   . Stroke Mother   . Hypertension Other   . Diabetes Other     Social History  Substance Use Topics  . Smoking status: Former Smoker -- 0.25 packs/day    Types: Cigarettes    Quit date: 04/20/2014  . Smokeless tobacco: Never Used  . Alcohol Use: No   OB History    No data available     Review of Systems  Constitutional: Negative for fever and diaphoresis.  HENT: Negative for rhinorrhea and sore throat.   Eyes: Negative for redness.  Respiratory: Positive for shortness of breath. Negative for cough.   Cardiovascular: Positive for chest pain. Negative for palpitations and leg swelling.  Gastrointestinal: Negative for nausea, vomiting, abdominal pain and diarrhea.  Genitourinary: Negative for dysuria.  Musculoskeletal: Positive for myalgias and neck pain. Negative for back pain.  Skin: Negative for rash.  Neurological: Negative for syncope, light-headedness and headaches.  Psychiatric/Behavioral: The patient is nervous/anxious.     Allergies  Ace inhibitors and Procardia  Home Medications   Prior to Admission medications   Medication Sig Start Date End Date Taking? Authorizing Provider  albuterol (PROVENTIL HFA;VENTOLIN HFA) 108 (90 Base) MCG/ACT inhaler Inhale 2 puffs into the lungs every 6 (six) hours as needed for wheezing or shortness of breath. 05/27/15   Lorre Nick, MD  amLODipine (NORVASC) 10 MG tablet Take 10 mg by mouth daily. 06/03/11   Azalia Bilis, MD  aspirin 81 MG chewable tablet Chew 81 mg by mouth daily as needed for mild pain or moderate pain.     Historical Provider, MD  atenolol-chlorthalidone (TENORETIC) 50-25 MG per tablet Take 1 tablet by mouth every morning. 04/06/14   Historical Provider, MD  buPROPion (WELLBUTRIN SR) 150 MG 12 hr tablet Take 150 mg by mouth 2 (two) times daily. 06/03/11   Azalia Bilis, MD  HYDROcodone-acetaminophen [facility]) 10-325 MG per tablet Take 1 tablet by mouth every 6 (six) hours as needed for moderate pain or severe pain.    Historical Provider, MD  KLOR-CON M20 20 MEQ tablet Take 20 mEq by  mouth daily. 07/06/14   Historical Provider, MD  levothyroxine (SYNTHROID, LEVOTHROID) 50 MCG tablet Take 50 mcg by mouth daily before breakfast.    Historical Provider, MD  metFORMIN (GLUCOPHAGE) 1000 MG tablet Take 1,000 mg by mouth 2 (two) times daily. 05/11/14   Historical Provider, MD  predniSONE (DELTASONE) 50 MG tablet 1 by mouth daily 05/27/15   Lorre Nick, MD  Specialty Vitamins Products (KIDNEY PO) Take 1 tablet by mouth 3 (three) times daily. Kidney factors    Historical Provider, MD  venlafaxine (EFFEXOR) 75 MG tablet Take 75 mg by mouth 3 (three) times daily with meals.    Historical Provider, MD   BP 121/88 mmHg  Pulse 83  Temp(Src) 97.7 F (36.5 C) (Oral)  Resp 16  Ht 5\' 10"  (1.778 m)  Wt 108.863 kg  BMI 34.44 kg/m2  SpO2 97%   Physical Exam  Constitutional: She is oriented to person, place, and time. She appears well-developed and well-nourished.  HENT:  Head: Normocephalic and atraumatic.  Mouth/Throat: Mucous membranes are normal. Mucous membranes are not dry.  Eyes: Conjunctivae are normal. Right eye exhibits no discharge. Left eye exhibits no discharge.  Neck: Trachea normal and normal range of motion. Neck supple. Normal carotid pulses and no JVD present. No muscular tenderness present. Carotid bruit is not present. No tracheal deviation present.  Cardiovascular: Normal rate, regular rhythm, S1 normal, S2 normal, normal heart sounds and intact distal pulses.  Exam reveals no decreased pulses.   No murmur heard. Pulmonary/Chest: Effort normal and breath sounds normal. No respiratory distress. She has no wheezes. She exhibits no tenderness.  Abdominal: Soft. Normal aorta and bowel sounds are normal. There is no tenderness. There is no rebound and no guarding.  Musculoskeletal: She exhibits no edema.       Right shoulder: Normal.       Left shoulder: She exhibits tenderness. She exhibits normal range of motion and no bony tenderness.       Right elbow: Normal.       Left elbow: Normal.       Right wrist: Normal.       Left wrist: Normal.       Cervical back: She exhibits tenderness. She exhibits normal range of motion and no bony tenderness.       Right upper arm: Normal.       Left upper arm: Normal.       Right forearm: Normal.       Left forearm: Normal.       Right hand: Normal.       Left hand: Normal.  Neurological: She is alert and oriented to person, place, and time. No cranial nerve deficit. Coordination normal.  5/5 strength in upper and lower extremities bilaterally.   Skin: Skin is warm and dry. She is not diaphoretic. No cyanosis. No pallor.  Psychiatric: She has a normal mood and affect.  Nursing note and vitals reviewed.   ED Course  Procedures (including critical care time) Labs Review  Labs Reviewed  BASIC METABOLIC PANEL - Abnormal; Notable for the following:    Potassium 3.4 (*)    Glucose, Bld 113 (*)    BUN 21 (*)    Creatinine, Ser 1.06 (*)    GFR calc non Af Amer 53 (*)    All other components within normal limits  CBC - Abnormal; Notable for the following:    Hemoglobin 11.9 (*)    HCT 35.9 (*)    All other components within normal limits  I-STAT TROPOININ, ED    Imaging Review Dg Chest 2 View  07/16/2015  CLINICAL DATA:  Left chest pain, shortness of breath and diaphoresis for the past 3 days. EXAM: CHEST  2 VIEW COMPARISON:  05/27/2015. FINDINGS: Normal sized heart. Clear lungs with normal vascularity. Mild central peribronchial thickening without significant change. Mild thoracic spine degenerative changes. IMPRESSION: No acute abnormality.  Stable mild bronchitic changes. Electronically Signed   By: Beckie Salts M.D.   On: 07/16/2015 23:14   I have personally reviewed and evaluated these images and lab results as part of my medical decision-making.   EKG Interpretation   Date/Time:  Saturday July 16 2015 23:02:19 EDT Ventricular Rate:  80 PR Interval:  172 QRS Duration: 103 QT Interval:  415 QTC  Calculation: 479 R Axis:   56 Text Interpretation:  Sinus rhythm No significant change since last  tracing Confirmed by Erroll Luna [phone]) on 07/16/2015 11:39:32  PM      12:19 AM Patient seen and examined. Work-up reviewed with patient.   Vital signs reviewed and are as follows: BP 121/88 mmHg  Pulse 83  Temp(Src) 97.7 F (36.5 C) (Oral)  Resp 16  Ht 5\' 10"  (1.778 m)  Wt 108.863 kg  BMI 34.44 kg/m2  SpO2 97%  Patient discussed with Dr. Mora Bellman who will see. We reviewed EKG. Agrees d/c to home with muscle relaxer.   Patient was counseled to return with severe chest pain, especially if the pain is crushing or pressure-like and spreads to the arms, back, neck, or jaw, or if they have sweating, nausea, or shortness of breath with the pain. They were encouraged to call 911 with these symptoms.   They were also told to return if their chest pain gets worse and does not go away with rest, they have an attack of chest pain lasting longer than usual despite rest and treatment with the medications their caregiver has prescribed, if they wake from sleep with chest pain or shortness of breath, if they feel dizzy or faint, if they have chest pain not typical of their usual pain, or if they have any other emergent concerns regarding their health.  The patient verbalized understanding and agreed.   No red flag s/s of neck/back pain. Patient was counseled on back pain precautions and told to do activity as tolerated but do not lift, push, or pull heavy objects more than 10 pounds for the next week.  Patient counseled to use ice or heat on back for no longer than 15 minutes every hour.   Patient prescribed muscle relaxer and counseled on proper use of muscle relaxant medication.    Urged patient not to drink alcohol, drive, or perform any other activities that requires focus while taking either of these medications.  Patient urged to follow-up with PCP if pain does not improve with treatment  and rest or if pain becomes recurrent. Urged to return with worsening severe pain, loss of bowel or bladder control, trouble walking.  The patient verbalizes understanding and agrees with the plan.     MDM   Final diagnoses:  Musculoskeletal neck pain   Neck pain: Reproducible, likely MSK or peripheral neurological. CT in 03/2015 showing several levels of arthritis in neck. Radiating pain down left arm has radicular features. No red flag signs and symptoms of back pain to suggest central cord syndrome. No neurological findings to suggest vascular etiology in neck.   CP: Ongoing and constant for three days, now resolved. Features are atypical (constant, non-exertional). Very low suspicion for ACS. Cardiac work-up is negative here.    Renne Crigler, PA-C 07/17/15 [id number]  Tomasita Crumble, MD 07/17/15 [phone]

## 2015-07-17 NOTE — Discharge Instructions (Signed)
Please read and follow all provided instructions.  Your diagnoses today include:  1. Musculoskeletal neck pain    Tests performed today include:  Vital signs - see below for your results today  Medications prescribed:   Robaxin (methocarbamol) - muscle relaxer medication  DO NOT drive or perform any activities that require you to be awake and alert because this medicine can make you drowsy.   Take any prescribed medications only as directed.  Home care instructions:   Follow any educational materials contained in this packet  Please rest, use ice or heat on your back for the next several days  Do not lift, push, pull anything more than 10 pounds for the next week  Follow-up instructions: Please follow-up with your primary care provider in the next 1 week for further evaluation of your symptoms.   Return instructions:  SEEK IMMEDIATE MEDICAL ATTENTION IF YOU HAVE:  New numbness, tingling, weakness, or problem with the use of your arms or legs  Severe back pain not relieved with medications  Loss control of your bowels or bladder  Increasing pain in any areas of the body (such as chest or abdominal pain)  Shortness of breath, dizziness, or fainting.   Worsening nausea (feeling sick to your stomach), vomiting, fever, or sweats  Any other emergent concerns regarding your health   Additional Information:  Your vital signs today were: BP 121/88 mmHg   Pulse 83   Temp(Src) 97.7 F (36.5 C) (Oral)   Resp 16   Ht 5\' 10"  (1.778 m)   Wt 108.863 kg   BMI 34.44 kg/m2   SpO2 97% If your blood pressure (BP) was elevated above 135/85 this visit, please have this repeated by your doctor within one month. --------------

## 2015-09-20 ENCOUNTER — Encounter (HOSPITAL_COMMUNITY): Payer: Self-pay | Admitting: *Deleted

## 2015-09-20 ENCOUNTER — Emergency Department (HOSPITAL_COMMUNITY)
Admission: EM | Admit: 2015-09-20 | Discharge: 2015-09-20 | Disposition: A | Discharge status: Home / Self Care | Payer: Medicare Other | Attending: Emergency Medicine | Admitting: Emergency Medicine

## 2015-09-20 DIAGNOSIS — I10 Essential (primary) hypertension: Secondary | ICD-10-CM | POA: Insufficient documentation

## 2015-09-20 DIAGNOSIS — Z87891 Personal history of nicotine dependence: Secondary | ICD-10-CM | POA: Insufficient documentation

## 2015-09-20 DIAGNOSIS — Z7982 Long term (current) use of aspirin: Secondary | ICD-10-CM | POA: Insufficient documentation

## 2015-09-20 DIAGNOSIS — K5901 Slow transit constipation: Secondary | ICD-10-CM | POA: Diagnosis not present

## 2015-09-20 DIAGNOSIS — E119 Type 2 diabetes mellitus without complications: Secondary | ICD-10-CM | POA: Insufficient documentation

## 2015-09-20 DIAGNOSIS — I2510 Atherosclerotic heart disease of native coronary artery without angina pectoris: Secondary | ICD-10-CM | POA: Diagnosis not present

## 2015-09-20 DIAGNOSIS — K5900 Constipation, unspecified: Secondary | ICD-10-CM | POA: Diagnosis present

## 2015-09-20 DIAGNOSIS — Z7984 Long term (current) use of oral hypoglycemic drugs: Secondary | ICD-10-CM | POA: Diagnosis not present

## 2015-09-20 DIAGNOSIS — E876 Hypokalemia: Secondary | ICD-10-CM | POA: Insufficient documentation

## 2015-09-20 DIAGNOSIS — E039 Hypothyroidism, unspecified: Secondary | ICD-10-CM | POA: Diagnosis not present

## 2015-09-20 DIAGNOSIS — Z79899 Other long term (current) drug therapy: Secondary | ICD-10-CM | POA: Insufficient documentation

## 2015-09-20 LAB — COMPREHENSIVE METABOLIC PANEL
ALT: 35 U/L (ref 14–54)
AST: 37 U/L (ref 15–41)
Albumin: 4.5 g/dL (ref 3.5–5.0)
Alkaline Phosphatase: 99 U/L (ref 38–126)
Anion gap: 8 (ref 5–15)
BUN: 21 mg/dL — ABNORMAL HIGH (ref 6–20)
CO2: 31 mmol/L (ref 22–32)
Calcium: 9.6 mg/dL (ref 8.9–10.3)
Chloride: 99 mmol/L — ABNORMAL LOW (ref 101–111)
Creatinine, Ser: 0.87 mg/dL (ref 0.44–1.00)
GFR calc Af Amer: >60 mL/min (ref >60)
GFR calc non Af Amer: >60 mL/min (ref >60)
Glucose, Bld: 75 mg/dL (ref 65–99)
Potassium: 3.2 mmol/L — ABNORMAL LOW (ref 3.5–5.1)
Sodium: 138 mmol/L (ref 135–145)
Total Bilirubin: 0.5 mg/dL (ref 0.3–1.2)
Total Protein: 7.7 g/dL (ref 6.5–8.1)

## 2015-09-20 LAB — CBC
HCT: 39.5 % (ref 36.0–46.0)
Hemoglobin: 12.4 g/dL (ref 12.0–15.0)
MCH: 25.9 pg — ABNORMAL LOW (ref 26.0–34.0)
MCHC: 31.4 g/dL (ref 30.0–36.0)
MCV: 82.5 fL (ref 78.0–100.0)
Platelets: 326 10*3/uL (ref 150–400)
RBC: 4.79 MIL/uL (ref 3.87–5.11)
RDW: 14.7 % (ref 11.5–15.5)
WBC: 9.7 10*3/uL (ref 4.0–10.5)

## 2015-09-20 LAB — LIPASE, BLOOD: Lipase: 23 U/L (ref 11–51)

## 2015-09-20 MED ORDER — POTASSIUM CHLORIDE ER 10 MEQ PO TBCR: 10.0000 meq | EXTENDED_RELEASE_TABLET | Freq: Two times a day (BID) | ORAL | 0 refills | Status: DC

## 2015-09-20 MED ORDER — POTASSIUM CHLORIDE CRYS ER 20 MEQ PO TBCR
40.0000 meq | EXTENDED_RELEASE_TABLET | Freq: Once | ORAL | Status: AC
  Administered 2015-09-20: 40 meq via ORAL
  Filled 2015-09-20: qty 2

## 2015-09-20 MED ORDER — SENNOSIDES-DOCUSATE SODIUM 8.6-50 MG PO TABS: 1.0000 | ORAL_TABLET | Freq: Two times a day (BID) | ORAL | 0 refills | Status: DC

## 2015-09-20 MED ORDER — POLYETHYLENE GLYCOL 3350 17 G PO PACK: 17.0000 g | PACK | Freq: Every day | ORAL | 0 refills | Status: DC

## 2015-09-20 NOTE — ED Provider Notes (Signed)
[facility]-EMERGENCY DEPT Provider Note   CSN: [id number] Arrival date & time: 09/20/15  1537     History   Chief Complaint Chief Complaint  Patient presents with  . Abdominal Pain  . Constipation    HPI Stacey Fletcher is a 65 y.o. female.  HPI   Taking hydrocodone 10-325, take 3 per day for pain, for severe pain in knee, taking it for one year, constipation has been on and off over the last year since began taking this medicine. Normally 3 days without going, now it's been 5 days.  Passing flatus. No nausea or vomiting.  Taking colace, tried milk of magnesia not helping.  Haven't tried miralax.  Some lower abdominal pain, now mild 5/10, bilateral lower. No vaginal bleeding/urinary symptoms.  Drinking fluids well.  Has never had colonoscopy. No fevers. PCP sent for XR abd.   Past Medical History:  Diagnosis Date  . Coronary artery disease   . Depression   . Diabetes mellitus   . Fibromyalgia   . Hypertension   . MI (myocardial infarction) [facility])     Patient Active Problem List   Diagnosis Date Noted  . ARF (acute renal failure) (HCC) 08/20/2014  . Chest pain 05/06/2013  . Nausea vomiting and diarrhea 05/06/2013  . Fever 05/06/2013  . HTN (hypertension) 05/06/2013  . Hypothyroidism 05/06/2013  . Diabetes mellitus (HCC) 05/06/2013    Past Surgical History:  Procedure Laterality Date  . ABDOMINAL HYSTERECTOMY    . BREAST SURGERY    . CARDIAC CATHETERIZATION     1995    OB History    No data available       Home Medications    Prior to Admission medications   Medication Sig Start Date End Date Taking? Authorizing Provider  albuterol (PROVENTIL HFA;VENTOLIN HFA) 108 (90 Base) MCG/ACT inhaler Inhale 2 puffs into the lungs every 6 (six) hours as needed for wheezing or shortness of breath. 05/27/15  Yes Lorre Nick, MD  amLODipine (NORVASC) 10 MG tablet Take 10 mg by mouth daily. 06/03/11  Yes Azalia Bilis, MD  aspirin 81 MG chewable tablet Chew 81 mg by mouth  daily.    Yes Historical Provider, MD  atenolol-chlorthalidone (TENORETIC) 50-25 MG per tablet Take 1 tablet by mouth every morning. 04/06/14  Yes Historical Provider, MD  buPROPion (WELLBUTRIN SR) 150 MG 12 hr tablet Take 150 mg by mouth 2 (two) times daily. 06/03/11  Yes Azalia Bilis, MD  HYDROcodone-acetaminophen [facility]) 10-325 MG per tablet Take 1 tablet by mouth 3 (three) times daily.    Yes Historical Provider, MD  KLOR-CON M20 20 MEQ tablet Take 20 mEq by mouth daily. 07/06/14  Yes Historical Provider, MD  levothyroxine (SYNTHROID, LEVOTHROID) 50 MCG tablet Take 50 mcg by mouth daily before breakfast.   Yes Historical Provider, MD  metFORMIN (GLUCOPHAGE) 1000 MG tablet Take 1,000 mg by mouth 2 (two) times daily. 05/11/14  Yes Historical Provider, MD  Specialty Vitamins Products (KIDNEY PO) Take 1 tablet by mouth 3 (three) times daily. Kidney factors   Yes Historical Provider, MD  venlafaxine (EFFEXOR) 75 MG tablet Take 75 mg by mouth 3 (three) times daily with meals.   Yes Historical Provider, MD  methocarbamol (ROBAXIN) 500 MG tablet Take 2 tablets (1,000 mg total) by mouth 4 (four) times daily. Patient not taking: Reported on 09/20/2015 07/17/15   Renne Crigler, PA-C  polyethylene glycol [facility]) packet Take 17 g by mouth daily. Take 4 caps of miralax and place in one 32oz bottle  of gatorade (or similar) beverage on day one, decrease to 2 caps per day for 2 days, and then 1 cap per day as needed until you have soft consistency of bowel movements. 09/20/15   Alvira MondayErin Hideko Esselman, MD  potassium chloride (K-DUR) 10 MEQ tablet Take 1 tablet (10 mEq total) by mouth 2 (two) times daily. 09/20/15 09/23/15  Alvira MondayErin Eoghan Belcher, MD  predniSONE (DELTASONE) 50 MG tablet 1 by mouth daily Patient not taking: Reported on 09/20/2015 05/27/15   Lorre NickAnthony Allen, MD  senna-docusate (SENOKOT-S) 8.6-50 MG tablet Take 1 tablet by mouth 2 (two) times daily. As needed for constipation 09/20/15   Alvira MondayErin Yvonnie Schinke, MD    Family  History Family History  Problem Relation Age of Onset  . Hypertension Mother   . Diabetes Mother   . Stroke Mother   . Hypertension Other   . Diabetes Other     Social History Social History  Substance Use Topics  . Smoking status: Former Smoker    Packs/day: 0.25    Types: Cigarettes    Quit date: 04/20/2014  . Smokeless tobacco: Never Used  . Alcohol use No     Allergies   Ace inhibitors and Procardia [nifedipine]   Review of Systems Review of Systems  Constitutional: Negative for fever.  HENT: Negative for sore throat.   Eyes: Negative for visual disturbance.  Respiratory: Negative for cough and shortness of breath.   Cardiovascular: Negative for chest pain.  Gastrointestinal: Positive for abdominal pain and constipation. Negative for diarrhea, nausea and vomiting.  Genitourinary: Negative for difficulty urinating and dysuria.  Musculoskeletal: Negative for back pain and neck pain.  Skin: Negative for rash.  Neurological: Negative for syncope and headaches.     Physical Exam Updated Vital Signs BP 118/79 (BP Location: Right Arm)   Pulse 87   Temp 98.5 F (36.9 C) (Oral)   Resp 18   Ht 5\' 9"  (1.753 m)   Wt 242 lb (109.8 kg)   SpO2 98%   BMI 35.74 kg/m   Physical Exam  Constitutional: She is oriented to person, place, and time. She appears well-developed and well-nourished. No distress.  HENT:  Head: Normocephalic and atraumatic.  Eyes: Conjunctivae and EOM are normal.  Neck: Normal range of motion.  Cardiovascular: Normal rate, regular rhythm, normal heart sounds and intact distal pulses.  Exam reveals no gallop and no friction rub.   No murmur heard. Pulmonary/Chest: Effort normal and breath sounds normal. No respiratory distress. She has no wheezes. She has no rales.  Abdominal: Soft. She exhibits no distension. There is no tenderness (no significant, does not some bilateral lower abd tenderness, mild). There is no guarding.  No cva tenderness   Musculoskeletal: She exhibits no edema or tenderness.  Neurological: She is alert and oriented to person, place, and time.  Skin: Skin is warm and dry. No rash noted. She is not diaphoretic. No erythema.  Nursing note and vitals reviewed.    ED Treatments / Results  Labs (all labs ordered are listed, but only abnormal results are displayed) Labs Reviewed  COMPREHENSIVE METABOLIC PANEL - Abnormal; Notable for the following:       Result Value   Potassium 3.2 (*)    Chloride 99 (*)    BUN 21 (*)    All other components within normal limits  CBC - Abnormal; Notable for the following:    MCH 25.9 (*)    All other components within normal limits  LIPASE, BLOOD    EKG  EKG Interpretation None       Radiology No results found.  Procedures Procedures (including critical care time)  Medications Ordered in ED Medications  potassium chloride SA (K-DUR,KLOR-CON) CR tablet 40 mEq (40 mEq Oral Given 09/20/15 1820)     Initial Impression / Assessment and Plan / ED Course  I have reviewed the triage vital signs and the nursing notes.  Pertinent labs & imaging results that were available during my care of the patient were reviewed by me and considered in my medical decision making (see chart for details).  Clinical Course   65yo female with history of CAD, depression, DM, htn, fibromyalgia presents with concern for constipation.  Reports PCP sent her for XR of abdomen.  Patient without n/v, no significant abdominal pain or tenderness, and is passing flatus, and doubt acute bowel obstruction, diverticulitis or other acute intraabdominal pathology.  Discussed that given benign exam, low clinical suspicion for obstruction or acute pathology, feel imaging will not change acute management. Patient with constipation, likely secondary to opiate use, and recommend strict bowel regimen with hydration, miralax, senokot, as well as follow up with gastroenterology for constipation/colonoscopy.  Pt also with hypokalemia 3.2 which may contribute to symptoms, given K and rx for same for 3 days. No sign of DKA, no sign pancreatitis. No urinary symptoms and doubt UTI. Patient discharged in stable condition with understanding of reasons to return.   Final Clinical Impressions(s) / ED Diagnoses   Final diagnoses:  Slow transit constipation  Hypokalemia    New Prescriptions Discharge Medication List as of 09/20/2015  5:32 PM    START taking these medications   Details  polyethylene glycol (MIRALAX) packet Take 17 g by mouth daily. Take 4 caps of miralax and place in one 32oz bottle of gatorade (or similar) beverage on day one, decrease to 2 caps per day for 2 days, and then 1 cap per day as needed until you have soft consistency of bowel movements., Start ing Tue 09/20/2015, Print    potassium chloride (K-DUR) 10 MEQ tablet Take 1 tablet (10 mEq total) by mouth 2 (two) times daily., Starting Tue 09/20/2015, Until Fri 09/23/2015, Print    senna-docusate (SENOKOT-S) 8.6-50 MG tablet Take 1 tablet by mouth 2 (two) times daily. As needed for constipation, Starting Tue 09/20/2015, Print         Alvira Monday, MD 09/21/15 [phone]

## 2015-09-20 NOTE — ED Notes (Signed)
Patient made aware of urine sample. Patient states she is unable to void at this time. Patient is encouraged to void when able.

## 2015-09-20 NOTE — ED Notes (Signed)
Discharge instructions, follow up care, and rx x3 reviewed with patient. Patient verbalized understanding. 

## 2015-09-20 NOTE — ED Triage Notes (Addendum)
Pt from home, reports she was sent to the ED by PCP d/t constipation and needs an xray done.  Pt reports abd pain without nausea at this time.  LBM-5 days.  Pt reports she is passing gas at night.  Has been taking pain meds for her knee pain.

## 2015-09-20 NOTE — ED Notes (Signed)
Bed: WTR5 Expected date:  Expected time:  Means of arrival:  Comments: 

## 2016-04-30 ENCOUNTER — Emergency Department (HOSPITAL_COMMUNITY)
Admission: EM | Admit: 2016-04-30 | Discharge: 2016-05-01 | Disposition: A | Discharge status: Home / Self Care | Payer: Medicare Other | Attending: Emergency Medicine | Admitting: Emergency Medicine

## 2016-04-30 ENCOUNTER — Encounter (HOSPITAL_COMMUNITY): Payer: Self-pay | Admitting: Emergency Medicine

## 2016-04-30 DIAGNOSIS — Z7982 Long term (current) use of aspirin: Secondary | ICD-10-CM | POA: Diagnosis not present

## 2016-04-30 DIAGNOSIS — Z87891 Personal history of nicotine dependence: Secondary | ICD-10-CM | POA: Diagnosis not present

## 2016-04-30 DIAGNOSIS — E039 Hypothyroidism, unspecified: Secondary | ICD-10-CM | POA: Diagnosis not present

## 2016-04-30 DIAGNOSIS — M79605 Pain in left leg: Secondary | ICD-10-CM | POA: Diagnosis not present

## 2016-04-30 DIAGNOSIS — E119 Type 2 diabetes mellitus without complications: Secondary | ICD-10-CM | POA: Diagnosis not present

## 2016-04-30 DIAGNOSIS — I10 Essential (primary) hypertension: Secondary | ICD-10-CM | POA: Insufficient documentation

## 2016-04-30 DIAGNOSIS — I2510 Atherosclerotic heart disease of native coronary artery without angina pectoris: Secondary | ICD-10-CM | POA: Diagnosis not present

## 2016-04-30 DIAGNOSIS — I252 Old myocardial infarction: Secondary | ICD-10-CM | POA: Insufficient documentation

## 2016-04-30 HISTORY — DX: Unspecified osteoarthritis, unspecified site: M19.90

## 2016-04-30 NOTE — ED Triage Notes (Signed)
Pt is c/o pain in her left calf for the past two days

## 2016-04-30 NOTE — ED Provider Notes (Signed)
[facility]-EMERGENCY DEPT Provider Note   CSN: [id number] Arrival date & time: 04/30/16  2157  By signing my name below, I, Nelwyn Salisbury, attest that this documentation has been prepared under the direction and in the presence of Faryn Sieg, MD . Electronically Signed: Nelwyn Salisbury, Scribe. 04/30/2016. 11:06 PM.  History   Chief Complaint Chief Complaint  Patient presents with  . Leg Pain   The history is provided by the patient. No language interpreter was used.  Leg Pain   This is a new problem. The current episode started more than 2 days ago. The problem occurs constantly. The problem has not changed since onset.The pain is present in the left lower leg. The pain is mild. Pertinent negatives include no numbness, full range of motion, no stiffness, no tingling and no itching. Exacerbated by: nothing. She has tried nothing for the symptoms. The treatment provided no relief. There has been no history of extremity trauma. Family history is significant for no gout.     HPI Comments:  Stacey Fletcher is a 66 y.o. female with pmhx of DM, HTN, CAD, and osteoarthritis who presents to the Emergency Department complaining of constant, mild, left calf pain onset 3 days ago. Pt describes her symptoms as an 8/10 pain beginning behind her kneecap and radiating to her mid-calf. No modifying factors indicated. Denies any weakness or numbness. Pt is compliant with daily diuretics.  Past Medical History:  Diagnosis Date  . Coronary artery disease   . Depression   . Diabetes mellitus   . Fibromyalgia   . Hypertension   . MI (myocardial infarction)   . Osteoarthritis     Patient Active Problem List   Diagnosis Date Noted  . ARF (acute renal failure) (HCC) 08/20/2014  . Chest pain 05/06/2013  . Nausea vomiting and diarrhea 05/06/2013  . Fever 05/06/2013  . HTN (hypertension) 05/06/2013  . Hypothyroidism 05/06/2013  . Diabetes mellitus (HCC) 05/06/2013    Past Surgical History:  Procedure  Laterality Date  . ABDOMINAL HYSTERECTOMY    . BREAST SURGERY    . CARDIAC CATHETERIZATION     1995    OB History    No data available       Home Medications    Prior to Admission medications   Medication Sig Start Date End Date Taking? Authorizing Provider  albuterol (PROVENTIL HFA;VENTOLIN HFA) 108 (90 Base) MCG/ACT inhaler Inhale 2 puffs into the lungs every 6 (six) hours as needed for wheezing or shortness of breath. 05/27/15  Yes Lorre Nick, MD  amLODipine (NORVASC) 10 MG tablet Take 10 mg by mouth daily. 06/03/11  Yes Azalia Bilis, MD  aspirin 81 MG chewable tablet Chew 81 mg by mouth daily.    Yes Historical Provider, MD  atenolol-chlorthalidone (TENORETIC) 50-25 MG per tablet Take 1 tablet by mouth every morning. 04/06/14  Yes Historical Provider, MD  buPROPion (WELLBUTRIN SR) 150 MG 12 hr tablet Take 150 mg by mouth 2 (two) times daily. 06/03/11  Yes Azalia Bilis, MD  HYDROcodone-acetaminophen [facility]) 10-325 MG per tablet Take 1 tablet by mouth 3 (three) times daily.    Yes Historical Provider, MD  KLOR-CON M20 20 MEQ tablet Take 20 mEq by mouth daily. 07/06/14  Yes Historical Provider, MD  levothyroxine (SYNTHROID, LEVOTHROID) 50 MCG tablet Take 50 mcg by mouth daily before breakfast.   Yes Historical Provider, MD  metFORMIN (GLUCOPHAGE) 1000 MG tablet Take 1,000 mg by mouth 2 (two) times daily. 05/11/14  Yes Historical Provider, MD  polyethylene  glycol (MIRALAX) packet Take 17 g by mouth daily. Take 4 caps of miralax and place in one 32oz bottle of gatorade (or similar) beverage on day one, decrease to 2 caps per day for 2 days, and then 1 cap per day as needed until you have soft consistency of bowel movements. Patient taking differently: Take 17 g by mouth daily as needed for moderate constipation. Take 4 caps of miralax and place in one 32oz bottle of gatorade (or similar) beverage on day one, decrease to 2 caps per day for 2 days, and then 1 cap per day as needed until you have  soft consistency of bowel movements. 09/20/15  Yes Alvira Monday, MD  Specialty Vitamins Products (KIDNEY PO) Take 3 tablets by mouth every evening. Kidney factors   Yes Historical Provider, MD  venlafaxine (EFFEXOR) 75 MG tablet Take 75 mg by mouth 3 (three) times daily with meals.   Yes Historical Provider, MD  methocarbamol (ROBAXIN) 500 MG tablet Take 2 tablets (1,000 mg total) by mouth 4 (four) times daily. Patient not taking: Reported on 09/20/2015 07/17/15   Renne Crigler, PA-C  potassium chloride (K-DUR) 10 MEQ tablet Take 1 tablet (10 mEq total) by mouth 2 (two) times daily. 09/20/15 09/23/15  Alvira Monday, MD  predniSONE (DELTASONE) 50 MG tablet 1 by mouth daily Patient not taking: Reported on 09/20/2015 05/27/15   Lorre Nick, MD  senna-docusate (SENOKOT-S) 8.6-50 MG tablet Take 1 tablet by mouth 2 (two) times daily. As needed for constipation Patient not taking: Reported on 04/30/2016 09/20/15   Alvira Monday, MD    Family History Family History  Problem Relation Age of Onset  . Hypertension Mother   . Diabetes Mother   . Stroke Mother   . Hypertension Other   . Diabetes Other     Social History Social History  Substance Use Topics  . Smoking status: Former Smoker    Packs/day: 0.25    Types: Cigarettes    Quit date: 04/20/2014  . Smokeless tobacco: Never Used  . Alcohol use No     Allergies   Ace inhibitors and Procardia [nifedipine]   Review of Systems Review of Systems  Constitutional: Negative for diaphoresis.  Respiratory: Negative for chest tightness and shortness of breath.   Cardiovascular: Negative for chest pain, palpitations and leg swelling.  Musculoskeletal: Positive for myalgias (Left Calf). Negative for joint swelling and stiffness.  Skin: Negative for itching.  Neurological: Negative for tingling, weakness and numbness.  All other systems reviewed and are negative.    Physical Exam Updated Vital Signs BP (!) 146/79 (BP Location: Right  Arm)   Pulse 99   Temp 97.9 F (36.6 C) (Oral)   Resp 20   Ht 5\' 10"  (1.778 m)   Wt 240 lb (108.9 kg)   SpO2 96%   BMI 34.44 kg/m   Physical Exam  Constitutional: She appears well-developed and well-nourished. No distress.  HENT:  Head: Normocephalic and atraumatic.  Mouth/Throat: Oropharynx is clear and moist. No oropharyngeal exudate.  Eyes: Conjunctivae are normal. Pupils are equal, round, and reactive to light.  Early cataracts in bilateral eyes  Neck: Normal range of motion. Neck supple.  Cardiovascular: Normal rate, regular rhythm, normal heart sounds and intact distal pulses.   3+ DP pulse. Negative Homans sign. No cords  Pulmonary/Chest: Effort normal and breath sounds normal. No stridor. She has no wheezes. She has no rales.  Abdominal: Soft. Bowel sounds are normal. There is no rebound and no guarding.  Musculoskeletal: Normal range of motion. She exhibits no edema, tenderness or deformity.  Left calf not warm or erythematous. No fluctuance. All compartments soft.   Neurological: She is alert. She has normal reflexes. She displays normal reflexes.  Skin: Skin is warm and dry. Capillary refill takes less than 2 seconds.  Psychiatric: She has a normal mood and affect. Her behavior is normal.  Nursing note and vitals reviewed.   ED Treatments / Results   Vitals:   04/30/16 2206  BP: (!) 146/79  Pulse: 99  Resp: 20  Temp: 97.9 F (36.6 C)     DIAGNOSTIC STUDIES: Oxygen Saturation is 96% on RA, normal by my interpretation.    COORDINATION OF CARE:  11:41 PM Discussed treatment plan with pt at bedside which includes referral to [facility] for doppler study and pt agreed to plan.  Procedures Procedures (including critical care time)    Final Clinical Impressions(s) / ED Diagnoses  Leg pain:  Sounds like Charlie horse.  Patient is not sedentary but will rule patient out for DVT in the am.  Patient will report to vascular lab in the am.  She denies CP or SOB and is  stable for discharge.  As she is low risk, I do not feel anticoagulation is indicated at this time.  The patient is nontoxic-appearing on exam and vital signs are within normal limits. Return for chest pain shortness of breath, coldness of the leg, numbness or any concerns.    After history, exam, and medical workup I feel the patient has been appropriately medically screened and is safe for discharge home. Pertinent diagnoses were discussed with the patient. Patient was given return precautions.  I personally performed the services described in this documentation, which was scribed in my presence. The recorded information has been reviewed and is accurate.        Cy Blamer, MD 04/30/16 2358

## 2016-05-01 ENCOUNTER — Other Ambulatory Visit (HOSPITAL_COMMUNITY): Payer: Self-pay | Admitting: Otolaryngology

## 2016-05-01 ENCOUNTER — Ambulatory Visit (HOSPITAL_BASED_OUTPATIENT_CLINIC_OR_DEPARTMENT_OTHER)
Admission: RE | Admit: 2016-05-01 | Discharge: 2016-05-01 | Disposition: A | Discharge status: Home / Self Care | Payer: Medicare Other | Source: Ambulatory Visit | Attending: Emergency Medicine | Admitting: Emergency Medicine

## 2016-05-01 DIAGNOSIS — M79605 Pain in left leg: Secondary | ICD-10-CM | POA: Diagnosis not present

## 2016-05-01 DIAGNOSIS — M79662 Pain in left lower leg: Secondary | ICD-10-CM

## 2016-05-01 NOTE — Progress Notes (Signed)
**  Preliminary report by tech**  Left lower extremity venous duplex complete. There is no evidence of deep or superficial vein thrombosis involving the left lower extremity. All visualized vessels appear patent and compressible. There is no evidence of a Baker's cyst on the left.  05/01/16 9:21 AM Olen Cordial RVT

## 2016-08-05 ENCOUNTER — Encounter (HOSPITAL_COMMUNITY): Payer: Self-pay | Admitting: Emergency Medicine

## 2016-08-05 ENCOUNTER — Emergency Department (HOSPITAL_COMMUNITY)
Admission: EM | Admit: 2016-08-05 | Discharge: 2016-08-06 | Disposition: A | Discharge status: Home / Self Care | Payer: Medicare Other | Attending: Emergency Medicine | Admitting: Emergency Medicine

## 2016-08-05 ENCOUNTER — Emergency Department (HOSPITAL_COMMUNITY): Payer: Medicare Other

## 2016-08-05 DIAGNOSIS — Z79899 Other long term (current) drug therapy: Secondary | ICD-10-CM | POA: Diagnosis not present

## 2016-08-05 DIAGNOSIS — E039 Hypothyroidism, unspecified: Secondary | ICD-10-CM | POA: Diagnosis not present

## 2016-08-05 DIAGNOSIS — G8929 Other chronic pain: Secondary | ICD-10-CM | POA: Diagnosis not present

## 2016-08-05 DIAGNOSIS — I1310 Hypertensive heart and chronic kidney disease without heart failure, with stage 1 through stage 4 chronic kidney disease, or unspecified chronic kidney disease: Secondary | ICD-10-CM | POA: Diagnosis not present

## 2016-08-05 DIAGNOSIS — M25569 Pain in unspecified knee: Secondary | ICD-10-CM | POA: Insufficient documentation

## 2016-08-05 DIAGNOSIS — E119 Type 2 diabetes mellitus without complications: Secondary | ICD-10-CM | POA: Diagnosis not present

## 2016-08-05 DIAGNOSIS — R072 Precordial pain: Secondary | ICD-10-CM

## 2016-08-05 DIAGNOSIS — F419 Anxiety disorder, unspecified: Secondary | ICD-10-CM | POA: Insufficient documentation

## 2016-08-05 DIAGNOSIS — Z7982 Long term (current) use of aspirin: Secondary | ICD-10-CM | POA: Diagnosis not present

## 2016-08-05 DIAGNOSIS — F17210 Nicotine dependence, cigarettes, uncomplicated: Secondary | ICD-10-CM | POA: Insufficient documentation

## 2016-08-05 DIAGNOSIS — T40605A Adverse effect of unspecified narcotics, initial encounter: Secondary | ICD-10-CM | POA: Diagnosis not present

## 2016-08-05 DIAGNOSIS — N189 Chronic kidney disease, unspecified: Secondary | ICD-10-CM | POA: Insufficient documentation

## 2016-08-05 DIAGNOSIS — K5903 Drug induced constipation: Secondary | ICD-10-CM

## 2016-08-05 DIAGNOSIS — G4700 Insomnia, unspecified: Secondary | ICD-10-CM | POA: Diagnosis present

## 2016-08-05 DIAGNOSIS — Z7984 Long term (current) use of oral hypoglycemic drugs: Secondary | ICD-10-CM | POA: Insufficient documentation

## 2016-08-05 DIAGNOSIS — F4312 Post-traumatic stress disorder, chronic: Secondary | ICD-10-CM | POA: Diagnosis not present

## 2016-08-05 DIAGNOSIS — F19982 Other psychoactive substance use, unspecified with psychoactive substance-induced sleep disorder: Secondary | ICD-10-CM

## 2016-08-05 DIAGNOSIS — Z760 Encounter for issue of repeat prescription: Secondary | ICD-10-CM

## 2016-08-05 LAB — CBC WITH DIFFERENTIAL/PLATELET
Basophils Absolute: 0 10*3/uL (ref 0.0–0.1)
Basophils Relative: 0 %
Eosinophils Absolute: 0.3 10*3/uL (ref 0.0–0.7)
Eosinophils Relative: 2 %
HCT: 38.5 % (ref 36.0–46.0)
Hemoglobin: 12.8 g/dL (ref 12.0–15.0)
Lymphocytes Relative: 37 %
Lymphs Abs: 4.3 10*3/uL — ABNORMAL HIGH (ref 0.7–4.0)
MCH: 26.7 pg (ref 26.0–34.0)
MCHC: 33.2 g/dL (ref 30.0–36.0)
MCV: 80.4 fL (ref 78.0–100.0)
Monocytes Absolute: 0.8 10*3/uL (ref 0.1–1.0)
Monocytes Relative: 7 %
Neutro Abs: 6.4 10*3/uL (ref 1.7–7.7)
Neutrophils Relative %: 54 %
Platelets: 310 10*3/uL (ref 150–400)
RBC: 4.79 MIL/uL (ref 3.87–5.11)
RDW: 15.2 % (ref 11.5–15.5)
WBC: 11.8 10*3/uL — ABNORMAL HIGH (ref 4.0–10.5)

## 2016-08-05 LAB — URINALYSIS, ROUTINE W REFLEX MICROSCOPIC
Bilirubin Urine: NEGATIVE
Glucose, UA: NEGATIVE mg/dL
Hgb urine dipstick: NEGATIVE
Ketones, ur: NEGATIVE mg/dL
Leukocytes, UA: NEGATIVE
Nitrite: NEGATIVE
Protein, ur: NEGATIVE mg/dL
Specific Gravity, Urine: 1.008 (ref 1.005–1.030)
pH: 7 (ref 5.0–8.0)

## 2016-08-05 LAB — COMPREHENSIVE METABOLIC PANEL
ALT: 22 U/L (ref 14–54)
AST: 29 U/L (ref 15–41)
Albumin: 4.3 g/dL (ref 3.5–5.0)
Alkaline Phosphatase: 101 U/L (ref 38–126)
Anion gap: 10 (ref 5–15)
BUN: 14 mg/dL (ref 6–20)
CO2: 29 mmol/L (ref 22–32)
Calcium: 10.2 mg/dL (ref 8.9–10.3)
Chloride: 104 mmol/L (ref 101–111)
Creatinine, Ser: 0.73 mg/dL (ref 0.44–1.00)
GFR calc Af Amer: >60 mL/min (ref >60)
GFR calc non Af Amer: >60 mL/min (ref >60)
Glucose, Bld: 99 mg/dL (ref 65–99)
Potassium: 3.4 mmol/L — ABNORMAL LOW (ref 3.5–5.1)
Sodium: 143 mmol/L (ref 135–145)
Total Bilirubin: 0.2 mg/dL — ABNORMAL LOW (ref 0.3–1.2)
Total Protein: 8 g/dL (ref 6.5–8.1)

## 2016-08-05 LAB — I-STAT TROPONIN, ED: Troponin i, poc: 0 ng/mL (ref 0.00–0.08)

## 2016-08-05 LAB — CBG MONITORING, ED: Glucose-Capillary: 100 mg/dL — ABNORMAL HIGH (ref 65–99)

## 2016-08-05 NOTE — ED Notes (Signed)
EKG given to EDP,James, MD., for review. 

## 2016-08-05 NOTE — ED Notes (Signed)
Patient transported to X-ray 

## 2016-08-05 NOTE — ED Provider Notes (Signed)
[facility]-EMERGENCY DEPT Provider Note   CSN: [id number] Arrival date & time: 08/05/16  1953     History   Chief Complaint Chief Complaint  Patient presents with  . Insomnia  . Medication Refill  . Chest Pain    HPI Stacey Fletcher is a 66 y.o. female with a hx of coronary artery disease, depression, non-insulin-dependent diabetes, fibromyalgia, hypertension, osteoarthritis presents to the Emergency Department complaining of gradual, persistent, progressively worsening insomnia onset 3 days ago. Patient reports a long history of PTSD after witnessing her son's murder many years ago. She reports that since that time she has been taking Effexor 3 times a day and Wellbutrin one time per day. She reports that these have helped keep her PTSD under control. 2 months ago she ran out of her Effexor prescription. Her primary care doctor wrote her a lower dose but she has not followed up with [facility], who rates her usual prescription. Patient reports she completely ran out of her Effexor one week ago. Since that time she's had intermittent chest pain with occasional radiation down her left arm. She reports increased anxiety, increased flashbacks and generally feeling very anxious.  Patient denies no neck avoiding alleviating factors for her chest pain.    Patient also complains of constipation with last bowel movement today but small and hard stools for the last several weeks. She reports chronic usage of hydrocodone for her bilateral knee pain. Patient has not attempted stool softeners or laxatives. Patient denies abdominal pain, nausea, vomiting, diarrhea, weakness, dizziness, syncope.   The history is provided by the patient and medical records. No language interpreter was used.    Past Medical History:  Diagnosis Date  . Coronary artery disease   . Depression   . Diabetes mellitus   . Fibromyalgia   . Hypertension   . MI (myocardial infarction) (HCC)   . Osteoarthritis     Patient Active  Problem List   Diagnosis Date Noted  . ARF (acute renal failure) (HCC) 08/20/2014  . Chest pain 05/06/2013  . Nausea vomiting and diarrhea 05/06/2013  . Fever 05/06/2013  . HTN (hypertension) 05/06/2013  . Hypothyroidism 05/06/2013  . Diabetes mellitus (HCC) 05/06/2013    Past Surgical History:  Procedure Laterality Date  . ABDOMINAL HYSTERECTOMY    . BREAST SURGERY    . CARDIAC CATHETERIZATION     1995    OB History    No data available       Home Medications    Prior to Admission medications   Medication Sig Start Date End Date Taking? Authorizing Provider  albuterol (PROVENTIL HFA;VENTOLIN HFA) 108 (90 Base) MCG/ACT inhaler Inhale 2 puffs into the lungs every 6 (six) hours as needed for wheezing or shortness of breath. 05/27/15   Lorre Nick, MD  amLODipine (NORVASC) 10 MG tablet Take 10 mg by mouth daily. 06/03/11   Azalia Bilis, MD  aspirin 81 MG chewable tablet Chew 81 mg by mouth daily.     [provider]  atenolol-chlorthalidone (TENORETIC) 50-25 MG per tablet Take 1 tablet by mouth every morning. 04/06/14   [provider]  buPROPion (WELLBUTRIN SR) 150 MG 12 hr tablet Take 150 mg by mouth 2 (two) times daily. 06/03/11   Azalia Bilis, MD  HYDROcodone-acetaminophen [facility]) 10-325 MG per tablet Take 1 tablet by mouth 3 (three) times daily.     [provider]  KLOR-CON M20 20 MEQ tablet Take 20 mEq by mouth daily. 07/06/14   [provider]  levothyroxine (  SYNTHROID, LEVOTHROID) 50 MCG tablet Take 50 mcg by mouth daily before breakfast.    [provider]  metFORMIN (GLUCOPHAGE) 1000 MG tablet Take 1,000 mg by mouth 2 (two) times daily. 05/11/14   [provider]  polyethylene glycol powder (GLYCOLAX/MIRALAX) powder Take 17 g by mouth 2 (two) times daily. 08/06/16   Aradhana Gin, Dahlia Client, PA-C  potassium chloride (K-DUR) 10 MEQ tablet Take 1 tablet (10 mEq total) by mouth 2 (two) times daily. 09/20/15 09/23/15  Alvira Monday, MD  Specialty Vitamins Products (KIDNEY PO) Take 3 tablets by mouth every evening. Kidney factors    [provider]  venlafaxine (EFFEXOR) 75 MG tablet Take 1 tablet (75 mg total) by mouth 3 (three) times daily with meals. 08/06/16   Alison Kubicki, Dahlia Client, PA-C    Family History Family History  Problem Relation Age of Onset  . Hypertension Mother   . Diabetes Mother   . Stroke Mother   . Hypertension Other   . Diabetes Other     Social History Social History  Substance Use Topics  . Smoking status: Current Every Day Smoker    Types: Cigarettes  . Smokeless tobacco: Never Used     Comment: three cigarettes/ day  . Alcohol use No     Allergies   Ace inhibitors and Procardia [nifedipine]   Review of Systems Review of Systems  Constitutional: Negative for appetite change, diaphoresis, fatigue, fever and unexpected weight change.  HENT: Negative for mouth sores.   Eyes: Negative for visual disturbance.  Respiratory: Negative for cough, chest tightness, shortness of breath and wheezing.   Cardiovascular: Positive for chest pain. Negative for leg swelling.  Gastrointestinal: Negative for abdominal pain, constipation, diarrhea, nausea and vomiting.  Endocrine: Negative for polydipsia, polyphagia and polyuria.  Genitourinary: Negative for dysuria, frequency, hematuria and urgency.  Musculoskeletal: Positive for arthralgias ( chronic knee pain). Negative for back pain and neck stiffness.  Skin: Negative for rash.  Allergic/Immunologic: Negative for immunocompromised state.  Neurological: Negative for syncope, light-headedness and headaches.  Hematological: Does not bruise/bleed easily.  Psychiatric/Behavioral: Positive for decreased concentration and sleep disturbance. Negative for suicidal ideas. The patient is nervous/anxious.   All other systems reviewed and are negative.    Physical Exam Updated Vital Signs BP (!) 129/96 (BP Location: Right Arm)   Pulse 90    Temp 98.6 F (37 C) (Oral)   Resp (!) 21   SpO2 98%   Physical Exam  Constitutional: She appears well-developed and well-nourished. No distress.  Awake, alert, nontoxic appearance  HENT:  Head: Normocephalic and atraumatic.  Mouth/Throat: Oropharynx is clear and moist. No oropharyngeal exudate.  Eyes: Conjunctivae are normal. No scleral icterus.  Neck: Normal range of motion. Neck supple.  Cardiovascular: Normal rate, regular rhythm and intact distal pulses.   Pulmonary/Chest: Effort normal and breath sounds normal. No respiratory distress. She has no wheezes.  Equal chest expansion  Abdominal: Soft. Bowel sounds are normal. She exhibits no mass. There is no tenderness. There is no rebound and no guarding.  Musculoskeletal: Normal range of motion. She exhibits no edema.  Full range motion of the bilateral knees without significant pain to palpation. No large joint effusion of either knee. No redness or erythema. No increased warmth. Patient ambulatory without difficulty.  Neurological: She is alert.  Speech is clear and goal oriented Moves extremities without ataxia  Skin: Skin is warm and dry. She is not diaphoretic.  Psychiatric: Her mood appears anxious.  Nursing note and vitals reviewed.  ED Treatments / Results  Labs (all labs ordered are listed, but only abnormal results are displayed) Labs Reviewed  CBC WITH DIFFERENTIAL/PLATELET - Abnormal; Notable for the following:       Result Value   WBC 11.8 (*)    Lymphs Abs 4.3 (*)    All other components within normal limits  COMPREHENSIVE METABOLIC PANEL - Abnormal; Notable for the following:    Potassium 3.4 (*)    Total Bilirubin 0.2 (*)    All other components within normal limits  URINALYSIS, ROUTINE W REFLEX MICROSCOPIC - Abnormal; Notable for the following:    Color, Urine STRAW (*)    All other components within normal limits  CBG MONITORING, ED - Abnormal; Notable for the following:    Glucose-Capillary 100  (*)    All other components within normal limits  I-STAT TROPOININ, ED    EKG  EKG Interpretation  Date/Time:  Sunday August 05 2016 23:07:22 EDT Ventricular Rate:  83 PR Interval:    QRS Duration: 97 QT Interval:  415 QTC Calculation: 488 R Axis:   48 Text Interpretation:  Sinus rhythm Probable left atrial enlargement Borderline prolonged QT interval No acute changes Confirmed by Jacalyn Lefevre [phone]) on 08/05/2016 11:14:14 PM       Radiology Dg Abdomen Acute W/chest  Result Date: 08/05/2016 CLINICAL DATA:  Acute onset of generalized chest discomfort and constipation. Mid abdominal pain. Initial encounter. EXAM: DG ABDOMEN ACUTE W/ 1V CHEST COMPARISON:  Chest radiograph performed 06/20/2016 FINDINGS: The lungs are well-aerated and clear. There is no evidence of focal opacification, pleural effusion or pneumothorax. The cardiomediastinal silhouette is within normal limits. The visualized bowel gas pattern is unremarkable. Scattered stool and air are seen within the colon; there is no evidence of small bowel dilatation to suggest obstruction. No free intra-abdominal air is identified on the provided upright view. No acute osseous abnormalities are seen; the sacroiliac joints are unremarkable in appearance. IMPRESSION: 1. Unremarkable bowel gas pattern; no free intra-abdominal air seen. Small amount of stool noted in the colon. 2. No acute cardiopulmonary process seen. Electronically Signed   By: Roanna Raider M.D.   On: 08/05/2016 23:31    Procedures Procedures (including critical care time)  Medications Ordered in ED Medications - No data to display   Initial Impression / Assessment and Plan / ED Course  I have reviewed the triage vital signs and the nursing notes.  Pertinent labs & imaging results that were available during my care of the patient were reviewed by me and considered in my medical decision making (see chart for details).     Pt process primarily with insomnia  after stopping her Effexor. Patient also with intermittent chest pain. She has a cardiac history however symptoms seem atypical and she reports they are not similar to her previous MI. Labs are reassuring with negative troponin and no evidence of CHF on her chest x-ray. EKG without acute abnormalities. Highly doubt ACS at this time. Patient without risk factors for DVT and I doubt pulmonary embolus. I suspect patient's symptoms are all related to her lack of Effexor. Will give her a one-week prescription. Discussed the importance of close follow-up with [facility].  Patient also with complaints of constipation that I suspect is secondary to her chronic narcotic usage. Long discussion about this including use of MiraLAX, stool softeners and increase hydration. Her abdomen is soft and nontender without distention. No clinical evidence of small bowel obstruction. Discussed reasons to return immediately to the emergency  department. Patient states understanding and is in agreement with the plan.  The patient was discussed with and seen by Dr. Particia Nearing who agrees with the treatment plan.   Final Clinical Impressions(s) / ED Diagnoses   Final diagnoses:  Anxiety  Encounter for medication refill  Precordial pain  Drug-induced insomnia (HCC)  Drug-induced constipation    New Prescriptions New Prescriptions   POLYETHYLENE GLYCOL POWDER (GLYCOLAX/MIRALAX) POWDER    Take 17 g by mouth 2 (two) times daily.     Jo Cerone, Boyd Kerbs 08/06/16 0018    Jacalyn Lefevre, MD 08/08/16 [phone]

## 2016-08-05 NOTE — ED Notes (Signed)
Bed: [facility] Expected date:  Expected time:  Means of arrival:  Comments: 

## 2016-08-05 NOTE — ED Triage Notes (Signed)
Pt from home with c/o insomnia x 3 days. Pt states she has been off Effexor because she ran out of it. Pt states she missed her last appointment at [facility], and she can not get an appointment anytime soon because she missed her appointment. Pt states she has PTSD from seeing her son murdered. Pt is tearful at time of assessment

## 2016-08-06 MED ORDER — VENLAFAXINE HCL 75 MG PO TABS: 75.0000 mg | ORAL_TABLET | Freq: Three times a day (TID) | ORAL | 0 refills | Status: DC

## 2016-08-06 MED ORDER — POLYETHYLENE GLYCOL 3350 17 GM/SCOOP PO POWD: 17.0000 g | Freq: Two times a day (BID) | ORAL | 0 refills | Status: DC

## 2016-08-06 NOTE — Discharge Instructions (Signed)
1. Medications: Effexor refill, MiraLAX, usual home medications 2. Treatment: rest, drink plenty of fluids,  3. Follow Up: Please followup with your primary doctor in 3 days for discussion of your diagnoses and further evaluation after today's visit; if you do not have a primary care doctor use the resource guide provided to find one; Please return to the ER for chest pain that is associated with nausea, syncope, sweating or other concerns.

## 2016-09-11 ENCOUNTER — Ambulatory Visit (INDEPENDENT_AMBULATORY_CARE_PROVIDER_SITE_OTHER): Payer: Medicare Other | Admitting: Podiatry

## 2016-09-11 ENCOUNTER — Encounter: Payer: Self-pay | Admitting: Podiatry

## 2016-09-11 VITALS — Ht 70.0 in | Wt 230.0 lb

## 2016-09-11 DIAGNOSIS — B351 Tinea unguium: Secondary | ICD-10-CM | POA: Diagnosis not present

## 2016-09-11 DIAGNOSIS — E0842 Diabetes mellitus due to underlying condition with diabetic polyneuropathy: Secondary | ICD-10-CM | POA: Diagnosis not present

## 2016-09-11 DIAGNOSIS — M79672 Pain in left foot: Secondary | ICD-10-CM

## 2016-09-11 DIAGNOSIS — M21619 Bunion of unspecified foot: Secondary | ICD-10-CM | POA: Diagnosis not present

## 2016-09-11 DIAGNOSIS — M79671 Pain in right foot: Secondary | ICD-10-CM

## 2016-09-11 NOTE — Patient Instructions (Addendum)
Seen for diabetic foot care. May have diabetic neuropathy on bottom of both feet. All nails debrided. May return in 3 months for routine foot care.  May bring signed diabetic shoe form for diabetic shoes.

## 2016-09-11 NOTE — Progress Notes (Signed)
Feet always hurt with pins and needles like feeling on bottom of both feet. Recently noted of bunions got bigger on both feet. Having problem cutting nails.  SUBJECTIVE: 66 y.o. year old NIDDM female presents complaining of pain in bottom of both feet that feels like pins and needed. She also noted that her bunions are getting larger with pain in closed in shoes. Patient also request toe nails trimmed and request for diabetic shoes.  REVIEW OF SYSTEMS: A comprehensive review of systems was negative except for: Diabetic x 8 years. Yesterday blood glucose was 135. A1c was 6.7.  OBJECTIVE: DERMATOLOGIC EXAMINATION: Thick dystrophic nails with fungal debris x 10. No acute skin lesions noted.  VASCULAR EXAMINATION OF LOWER LIMBS: All pedal pulses are palpable with normal pulsation.  No edema or erythema noted. Temperature gradient from tibial crest to dorsum of foot is within normal bilateral.  NEUROLOGIC EXAMINATION OF THE LOWER LIMBS: Subjective numbness and burning pain on both feet. Achilles DTR is present and within normal. Monofilament (Semmes-Weinstein 10-gm) sensory testing positive 4 out of 6, bilateral. Vibratory sensations(128Hz  turning fork) intact at medial and lateral forefoot bilateral.  Sharp and Dull discriminatory sensations at the plantar ball of hallux is intact bilateral.   MUSCULOSKELETAL EXAMINATION: Positive for enlarged dorsomedial bunion on both feet.  ASSESSMENT: NIDDM controlled. Diabetic neuropathy with subjective symptoms. Onychomycosis. Painful bunion bilateral.  PLAN: Reviewed clinical findings and available treatment options. All nails debrided. Diabetic shoe form dispensed to bring back with PCP certification. May return in 3 months for routine foot care.

## 2016-10-31 ENCOUNTER — Encounter: Payer: Self-pay | Admitting: Podiatry

## 2016-10-31 ENCOUNTER — Ambulatory Visit (INDEPENDENT_AMBULATORY_CARE_PROVIDER_SITE_OTHER): Payer: Medicare Other | Admitting: Podiatry

## 2016-10-31 DIAGNOSIS — M79672 Pain in left foot: Secondary | ICD-10-CM

## 2016-10-31 DIAGNOSIS — E0842 Diabetes mellitus due to underlying condition with diabetic polyneuropathy: Secondary | ICD-10-CM

## 2016-10-31 DIAGNOSIS — M21619 Bunion of unspecified foot: Secondary | ICD-10-CM

## 2016-10-31 DIAGNOSIS — M79671 Pain in right foot: Secondary | ICD-10-CM

## 2016-10-31 NOTE — Progress Notes (Signed)
SUBJECTIVE: 66 y.o. year old NIDDM female presents requesting for diabetic shoes. Stated that her PCP sent [place] signed authorization paper for diabetic shoes. Stated that her blood sugar reading was 103 this morning.  HPI: Pain in bottom of both feet that feels like pins and needed. She also noted that her bunions are getting larger with pain in closed in shoes.   REVIEW OF SYSTEMS: A comprehensive review of systems was negative except for: Diabetic x 8 years.   OBJECTIVE: DERMATOLOGIC EXAMINATION: No acute skin lesions noted.  VASCULAR EXAMINATION OF LOWER LIMBS: All pedal pulses are palpable with normal pulsation.  No edema or erythema noted. Temperature gradient from tibial crest to dorsum of foot is within normal bilateral.  NEUROLOGIC EXAMINATION OF THE LOWER LIMBS: Subjective numbness and burning pain on both feet. Achilles DTR is present and within normal. Monofilament (Semmes-Weinstein 10-gm) sensory testing positive 4 out of 6, bilateral. Vibratory sensations(128Hz  turning fork) intact at medial and lateral forefoot bilateral.  Sharp and Dull discriminatory sensations at the plantar ball of hallux is intact bilateral.   MUSCULOSKELETAL EXAMINATION: Positive for enlarged dorsomedial bunion on both feet.  ASSESSMENT: NIDDM controlled. Diabetic neuropathy with subjective symptoms. Painful bunion bilateral.  PLAN: Both feet measured for diabetic shoes. May return for the next appointment for diabetic foot care.

## 2016-10-31 NOTE — Patient Instructions (Signed)
Both feet measured for diabetic shoes. Return for next routine foot care appointment.

## 2016-11-21 ENCOUNTER — Encounter (HOSPITAL_COMMUNITY): Payer: Self-pay

## 2016-11-21 ENCOUNTER — Emergency Department (HOSPITAL_COMMUNITY)
Admission: EM | Admit: 2016-11-21 | Discharge: 2016-11-21 | Disposition: A | Discharge status: Home / Self Care | Payer: Medicare Other | Attending: Emergency Medicine | Admitting: Emergency Medicine

## 2016-11-21 ENCOUNTER — Emergency Department (HOSPITAL_COMMUNITY): Payer: Medicare Other

## 2016-11-21 DIAGNOSIS — I2510 Atherosclerotic heart disease of native coronary artery without angina pectoris: Secondary | ICD-10-CM | POA: Diagnosis not present

## 2016-11-21 DIAGNOSIS — F17210 Nicotine dependence, cigarettes, uncomplicated: Secondary | ICD-10-CM | POA: Insufficient documentation

## 2016-11-21 DIAGNOSIS — R05 Cough: Secondary | ICD-10-CM | POA: Diagnosis present

## 2016-11-21 DIAGNOSIS — Z79899 Other long term (current) drug therapy: Secondary | ICD-10-CM | POA: Diagnosis not present

## 2016-11-21 DIAGNOSIS — E119 Type 2 diabetes mellitus without complications: Secondary | ICD-10-CM | POA: Insufficient documentation

## 2016-11-21 DIAGNOSIS — J069 Acute upper respiratory infection, unspecified: Secondary | ICD-10-CM | POA: Diagnosis not present

## 2016-11-21 DIAGNOSIS — I10 Essential (primary) hypertension: Secondary | ICD-10-CM | POA: Diagnosis not present

## 2016-11-21 DIAGNOSIS — Z7984 Long term (current) use of oral hypoglycemic drugs: Secondary | ICD-10-CM | POA: Diagnosis not present

## 2016-11-21 DIAGNOSIS — I252 Old myocardial infarction: Secondary | ICD-10-CM | POA: Insufficient documentation

## 2016-11-21 DIAGNOSIS — M791 Myalgia, unspecified site: Secondary | ICD-10-CM | POA: Diagnosis not present

## 2016-11-21 LAB — INFLUENZA PANEL BY PCR (TYPE A & B)
Influenza A By PCR: NEGATIVE
Influenza B By PCR: NEGATIVE

## 2016-11-21 MED ORDER — ALBUTEROL SULFATE (2.5 MG/3ML) 0.083% IN NEBU
2.5000 mg | INHALATION_SOLUTION | Freq: Once | RESPIRATORY_TRACT | Status: AC
  Administered 2016-11-21: 2.5 mg via RESPIRATORY_TRACT
  Filled 2016-11-21: qty 3

## 2016-11-21 MED ORDER — ALBUTEROL SULFATE HFA 108 (90 BASE) MCG/ACT IN AERS
2.0000 | INHALATION_SPRAY | Freq: Once | RESPIRATORY_TRACT | Status: AC
  Administered 2016-11-21: 2 via RESPIRATORY_TRACT

## 2016-11-21 MED ORDER — AMOXICILLIN 500 MG PO CAPS: 1000.0000 mg | ORAL_CAPSULE | Freq: Two times a day (BID) | ORAL | 0 refills | Status: DC

## 2016-11-21 NOTE — ED Provider Notes (Signed)
[facility]-EMERGENCY DEPT Provider Note   CSN: [id number] Arrival date & time: 11/21/16  1019     History   Chief Complaint Chief Complaint  Patient presents with  . Cough  . Generalized Body Aches    HPI   Blood pressure 104/68, pulse 80, temperature 98.1 F (36.7 C), temperature source Oral, resp. rate 16, height 5\' 10"  (1.778 m), weight 106.6 kg (235 lb), SpO2 99 %.  Stacey MayhewMargaret Virden is a 66 y.o. female with past medical history significant for CAD, non-insulin-dependent diabetes complaining of productive cough with pleuritic anterior chest pain onset 3 weeks ago. She's also had fever (Tmax 101.0 x3 days ago) there is associated rhinorrhea and diffuse myalgia. She states that the cough is worse at night. There is also associated sinus pressure and pain with no ear pain, sore throat, abdominal pain, change in bowel or bladder habits. She states that she's eating slightly less than normal because she doesn't have an appetite. She's been using Flonase and course [place] at home with little relief. She has a positive sick contact in that her grandson had a similar illness 3 weeks ago but he feels much better. She has not had a flu shot this year. She denies history of DVT/PE, recent immobilizations, calf pain, leg swelling, hemoptysis, palpitations or feeling lightheaded.  Past Medical History:  Diagnosis Date  . Coronary artery disease   . Depression   . Diabetes mellitus   . Fibromyalgia   . Hypertension   . MI (myocardial infarction) (HCC)   . Osteoarthritis     Patient Active Problem List   Diagnosis Date Noted  . ARF (acute renal failure) (HCC) 08/20/2014  . Chest pain 05/06/2013  . Nausea vomiting and diarrhea 05/06/2013  . Fever 05/06/2013  . HTN (hypertension) 05/06/2013  . Hypothyroidism 05/06/2013  . Diabetes mellitus (HCC) 05/06/2013    Past Surgical History:  Procedure Laterality Date  . ABDOMINAL HYSTERECTOMY    . BREAST SURGERY    .  CARDIAC CATHETERIZATION     1995    OB History    No data available       Home Medications    Prior to Admission medications   Medication Sig Start Date End Date Taking? Authorizing Provider  albuterol (PROVENTIL HFA;VENTOLIN HFA) 108 (90 Base) MCG/ACT inhaler Inhale 2 puffs into the lungs every 6 (six) hours as needed for wheezing or shortness of breath. 05/27/15   Lorre NickAllen, Anthony, MD  amLODipine (NORVASC) 10 MG tablet Take 10 mg by mouth daily. 06/03/11   Azalia Bilisampos, Kevin, MD  amoxicillin (AMOXIL) 500 MG capsule Take 2 capsules (1,000 mg total) by mouth 2 (two) times daily. 11/21/16   Shoji Pertuit, Joni ReiningNicole, PA-C  aspirin 81 MG chewable tablet Chew 81 mg by mouth daily.     [provider]  atenolol-chlorthalidone (TENORETIC) 50-25 MG per tablet Take 1 tablet by mouth every morning. 04/06/14   [provider]  buPROPion (WELLBUTRIN SR) 150 MG 12 hr tablet Take 150 mg by mouth 2 (two) times daily. 06/03/11   Azalia Bilisampos, Kevin, MD  HYDROcodone-acetaminophen [facility](NORCO) 10-325 MG per tablet Take 1 tablet by mouth 3 (three) times daily.     [provider]  KLOR-CON M20 20 MEQ tablet Take 20 mEq by mouth daily. 07/06/14   [provider]  levothyroxine (SYNTHROID, LEVOTHROID) 50 MCG tablet Take 50 mcg by mouth daily before breakfast.    [provider]  metFORMIN (GLUCOPHAGE) 1000 MG tablet Take 1,000 mg by mouth  2 (two) times daily. 05/11/14   [provider]  polyethylene glycol powder (GLYCOLAX/MIRALAX) powder Take 17 g by mouth 2 (two) times daily. 08/06/16   Muthersbaugh, Dahlia Client, PA-C  potassium chloride (K-DUR) 10 MEQ tablet Take 1 tablet (10 mEq total) by mouth 2 (two) times daily. 09/20/15 09/23/15  Alvira Monday, MD  Specialty Vitamins Products (KIDNEY PO) Take 3 tablets by mouth every evening. Kidney factors    [provider]  venlafaxine (EFFEXOR) 75 MG tablet Take 1 tablet (75 mg total) by mouth 3 (three) times daily with meals. 08/06/16    Muthersbaugh, Dahlia Client, PA-C    Family History Family History  Problem Relation Age of Onset  . Hypertension Mother   . Diabetes Mother   . Stroke Mother   . Hypertension Other   . Diabetes Other     Social History Social History  Substance Use Topics  . Smoking status: Current Every Day Smoker    Packs/day: 0.15    Types: Cigarettes  . Smokeless tobacco: Never Used     Comment: three cigarettes/ day  . Alcohol use No     Allergies   Ace inhibitors and Procardia [nifedipine]   Review of Systems Review of Systems  A complete review of systems was obtained and all systems are negative except as noted in the HPI and PMH.   Physical Exam Updated Vital Signs BP 104/68 (BP Location: Right Arm)   Pulse 80   Temp 98.1 F (36.7 C) (Oral)   Resp 16   Ht 5\' 10"  (1.778 m)   Wt 106.6 kg (235 lb)   SpO2 99%   BMI 33.72 kg/m   Physical Exam  Constitutional: She appears well-developed and well-nourished.  HENT:  Head: Normocephalic.  Right Ear: External ear normal.  Left Ear: External ear normal.  Mouth/Throat: Oropharynx is clear and moist. No oropharyngeal exudate.  No drooling or stridor. Posterior pharynx mildly erythematous no significant tonsillar hypertrophy. No exudate. Soft palate rises symmetrically. No TTP or induration under tongue.   No tenderness to palpation of frontal or bilateral maxillary sinuses.  Mild mucosal edema in the nares with scant rhinorrhea.  Bilateral tympanic membranes with normal architecture and good light reflex.    Eyes: Pupils are equal, round, and reactive to light. Conjunctivae and EOM are normal.  Neck: Normal range of motion. Neck supple.  Cardiovascular: Normal rate and regular rhythm.   Pulmonary/Chest: Effort normal and breath sounds normal. No stridor. No respiratory distress. She has no wheezes. She has no rales. She exhibits no tenderness.  Abdominal: Soft. There is no tenderness. There is no rebound and no guarding.    Nursing note and vitals reviewed.    ED Treatments / Results  Labs (all labs ordered are listed, but only abnormal results are displayed) Labs Reviewed  INFLUENZA PANEL BY PCR (TYPE A & B)    EKG  EKG Interpretation None       Radiology Dg Chest 2 View  Result Date: 11/21/2016 CLINICAL DATA:  Productive cough, chest congestion, slight SOB; x 2-3 weeks; hx HTN, smoker EXAM: CHEST  2 VIEW COMPARISON:  None. FINDINGS: Normal mediastinum and cardiac silhouette. Normal pulmonary vasculature. No evidence of effusion, infiltrate, or pneumothorax. No acute bony abnormality. IMPRESSION: No acute cardiopulmonary process. Electronically Signed   By: Genevive Bi M.D.   On: 11/21/2016 12:05    Procedures Procedures (including critical care time)  Medications Ordered in ED Medications  albuterol (PROVENTIL) (2.5 MG/3ML) 0.083% nebulizer solution 2.5 mg (  2.5 mg Nebulization Given 11/21/16 1203)  albuterol (PROVENTIL HFA;VENTOLIN HFA) 108 (90 Base) MCG/ACT inhaler 2 puff (2 puffs Inhalation Given 11/21/16 1321)     Initial Impression / Assessment and Plan / ED Course  I have reviewed the triage vital signs and the nursing notes.  Pertinent labs & imaging results that were available during my care of the patient were reviewed by me and considered in my medical decision making (see chart for details).     Vitals:   11/21/16 1048 11/21/16 1051 11/21/16 1321  BP: 101/66  104/68  Pulse: 93  80  Resp: 16  16  Temp: 98.1 F (36.7 C)    TempSrc: Oral    SpO2: 98%  99%  Weight:  106.6 kg (235 lb)   Height:  5\' 10"  (1.778 m)     Medications  albuterol (PROVENTIL) (2.5 MG/3ML) 0.083% nebulizer solution 2.5 mg (2.5 mg Nebulization Given 11/21/16 1203)  albuterol (PROVENTIL HFA;VENTOLIN HFA) 108 (90 Base) MCG/ACT inhaler 2 puff (2 puffs Inhalation Given 11/21/16 1321)    Stacey Fletcher is 66 y.o. female presenting with rhinorrhea, myalgia, sinus pressure and pain, fever,  productive cough with pleuritic chest pain. Lung sounds clear to auscultation, patient saturating well on room air, no tachypnea, tachycardia. I doubt this is a pneumonia, patient nontoxic appearing. Will give nebulizer treatment obtain chest x-ray and flu test.  Chest x-rays without infiltrate, flu negative, likely URI. Patient given antibiotic prescription to initiate if she is not better within the next week. Case management consulted to help this patient obtain primary care.  This is a shared visit with the attending physician who personally evaluated the patient and agrees with the care plan.   Evaluation does not show pathology that would require ongoing emergent intervention or inpatient treatment. Pt is hemodynamically stable and mentating appropriately. Discussed findings and plan with patient/guardian, who agrees with care plan. All questions answered. Return precautions discussed and outpatient follow up given.      Final Clinical Impressions(s) / ED Diagnoses   Final diagnoses:  Upper respiratory tract infection, unspecified type    New Prescriptions Discharge Medication List as of 11/21/2016  1:13 PM    START taking these medications   Details  amoxicillin (AMOXIL) 500 MG capsule Take 2 capsules (1,000 mg total) by mouth 2 (two) times daily., Starting Wed 11/21/2016, Print         Genine Beckett, [place], PA-C 11/21/16 1346    Derwood Kaplan, MD 11/22/16 2225

## 2016-11-21 NOTE — ED Triage Notes (Addendum)
Patient c/o a productive cough with green sputum, body aches x 2 weeks. Patient state she has been taking Coricidin OTC with no relief. Patient reports cough is worse at night.

## 2016-11-21 NOTE — ED Notes (Signed)
Bed: WTR6 Expected date:  Expected time:  Means of arrival:  Comments: 

## 2016-11-21 NOTE — Discharge Planning (Signed)
EDCM consulted to assist pt with PCP establishment.  Pt has [employer]; NCM provided instructions on calling number on back of card to obtain list of PCPs in area accepting his insurance along with a list of some known physician's offices accepting new pts.  No further needs communicated at this time.

## 2016-11-21 NOTE — Discharge Instructions (Signed)
Rest, stay well-hydrated and you may also consider taking vitamin C. Do not sniffle the nose, blow forward whenever you feel drainage. If you are not feeling better in 3-5 days or if your symptoms worsen with fever, cough or shortness of breath - start antibiotics at that time. DO NOT HESITATE to return to the emergency department for concerning symptoms.   Do not hesitate to return to the emergency room for any new, worsening or concerning symptoms.  Please obtain primary care using resource guide below. Let them know that you were seen in the emergency room and that they will need to obtain records for further outpatient management.

## 2016-12-04 ENCOUNTER — Ambulatory Visit (INDEPENDENT_AMBULATORY_CARE_PROVIDER_SITE_OTHER): Payer: Medicare Other | Admitting: Podiatry

## 2016-12-04 ENCOUNTER — Ambulatory Visit (INDEPENDENT_AMBULATORY_CARE_PROVIDER_SITE_OTHER): Payer: Medicare Other

## 2016-12-04 VITALS — BP 109/76 | HR 95 | Ht 70.0 in | Wt 230.0 lb

## 2016-12-04 DIAGNOSIS — M2012 Hallux valgus (acquired), left foot: Secondary | ICD-10-CM

## 2016-12-04 DIAGNOSIS — E0842 Diabetes mellitus due to underlying condition with diabetic polyneuropathy: Secondary | ICD-10-CM | POA: Diagnosis not present

## 2016-12-04 DIAGNOSIS — M205X9 Other deformities of toe(s) (acquired), unspecified foot: Secondary | ICD-10-CM | POA: Diagnosis not present

## 2016-12-04 DIAGNOSIS — M2011 Hallux valgus (acquired), right foot: Secondary | ICD-10-CM

## 2016-12-04 DIAGNOSIS — L603 Nail dystrophy: Secondary | ICD-10-CM

## 2016-12-04 NOTE — Progress Notes (Signed)
Subjective:    Patient ID: Stacey Fletcher, female    DOB: 11/10/1950, 66 y.o.   MRN: [id number]  HPI  Chief Complaint  Patient presents with  . Diabetes    last A1C 7.5/ Diabetic foot care/Bilateral thickened toenails  . Foot Pain    bilateral, great toes and pain in joints in foot. Occassional ankle pain       Review of Systems  All other systems reviewed and are negative.      Objective:   Physical Exam: She presents today pulses are palpable neurologic sensorium is intact. Deep tendon reflexes are intact muscle strength +5 over 5 dorsiflexion and plantar flexors and ierters and everters all intrinsic musculature is intact. Orthopedic evaluation demonstrates all joints distal to the ankle for range of motion without crepitation. She has limitation on range of motionof the first metatarsophalangeal joint of the left foot.No open lesions or wounds are noted.  Radiographs 3 views bilateral foot taken today in the office demonstrate hallux limitus with osteoarthritic changes first metatarsophalangeal joint of the left foot. She also has posterior spurring and thickening f the Achilles tendon at its i   Assessment & Plan:  Nail dystrophy hallux bilateral hallux limitus with capsulitis first metatarsophalangeal joint bilateral. Plan: Dexamethasone injections bilaterally today. Samples of the nail were taken for pathologic evaluation.

## 2016-12-04 NOTE — Patient Instructions (Signed)

## 2016-12-12 ENCOUNTER — Ambulatory Visit: Payer: Medicare Other | Admitting: Podiatry

## 2017-01-01 ENCOUNTER — Encounter (INDEPENDENT_AMBULATORY_CARE_PROVIDER_SITE_OTHER): Payer: Medicare Other | Admitting: Podiatry

## 2017-01-01 NOTE — Progress Notes (Signed)
This encounter was created in error - please disregard.

## 2017-01-02 ENCOUNTER — Other Ambulatory Visit: Payer: Self-pay | Admitting: Internal Medicine

## 2017-01-02 DIAGNOSIS — Z1239 Encounter for other screening for malignant neoplasm of breast: Secondary | ICD-10-CM

## 2017-01-30 ENCOUNTER — Ambulatory Visit: Payer: Self-pay

## 2017-05-17 DIAGNOSIS — M179 Osteoarthritis of knee, unspecified: Secondary | ICD-10-CM | POA: Diagnosis not present

## 2017-05-17 DIAGNOSIS — I10 Essential (primary) hypertension: Secondary | ICD-10-CM | POA: Diagnosis not present

## 2017-05-17 DIAGNOSIS — E119 Type 2 diabetes mellitus without complications: Secondary | ICD-10-CM | POA: Diagnosis not present

## 2017-05-17 DIAGNOSIS — Z0000 Encounter for general adult medical examination without abnormal findings: Secondary | ICD-10-CM | POA: Diagnosis not present

## 2017-05-29 ENCOUNTER — Other Ambulatory Visit: Payer: Self-pay | Admitting: Family Medicine

## 2017-05-29 DIAGNOSIS — R5381 Other malaise: Secondary | ICD-10-CM

## 2017-05-30 ENCOUNTER — Other Ambulatory Visit: Payer: Self-pay | Admitting: Family Medicine

## 2017-05-30 DIAGNOSIS — E2839 Other primary ovarian failure: Secondary | ICD-10-CM

## 2017-07-05 DIAGNOSIS — E119 Type 2 diabetes mellitus without complications: Secondary | ICD-10-CM | POA: Diagnosis not present

## 2017-07-09 ENCOUNTER — Ambulatory Visit
Admission: RE | Admit: 2017-07-09 | Discharge: 2017-07-09 | Disposition: A | Discharge status: Home / Self Care | Payer: Medicare Other | Source: Ambulatory Visit | Attending: Internal Medicine | Admitting: Internal Medicine

## 2017-07-09 ENCOUNTER — Ambulatory Visit
Admission: RE | Admit: 2017-07-09 | Discharge: 2017-07-09 | Disposition: A | Discharge status: Home / Self Care | Payer: Medicare Other | Source: Ambulatory Visit | Attending: Family Medicine | Admitting: Family Medicine

## 2017-07-09 DIAGNOSIS — E2839 Other primary ovarian failure: Secondary | ICD-10-CM

## 2017-07-09 DIAGNOSIS — Z1231 Encounter for screening mammogram for malignant neoplasm of breast: Secondary | ICD-10-CM | POA: Diagnosis not present

## 2017-07-09 DIAGNOSIS — Z780 Asymptomatic menopausal state: Secondary | ICD-10-CM | POA: Diagnosis not present

## 2017-07-09 DIAGNOSIS — Z1239 Encounter for other screening for malignant neoplasm of breast: Secondary | ICD-10-CM

## 2017-07-18 DIAGNOSIS — I10 Essential (primary) hypertension: Secondary | ICD-10-CM | POA: Diagnosis not present

## 2017-09-15 ENCOUNTER — Other Ambulatory Visit: Payer: Self-pay

## 2017-09-15 ENCOUNTER — Emergency Department (HOSPITAL_COMMUNITY)
Admission: EM | Admit: 2017-09-15 | Discharge: 2017-09-15 | Disposition: A | Discharge status: Home / Self Care | Payer: Medicare Other | Attending: Emergency Medicine | Admitting: Emergency Medicine

## 2017-09-15 ENCOUNTER — Emergency Department (HOSPITAL_COMMUNITY): Payer: Medicare Other

## 2017-09-15 DIAGNOSIS — Y92410 Unspecified street and highway as the place of occurrence of the external cause: Secondary | ICD-10-CM | POA: Insufficient documentation

## 2017-09-15 DIAGNOSIS — E039 Hypothyroidism, unspecified: Secondary | ICD-10-CM | POA: Diagnosis not present

## 2017-09-15 DIAGNOSIS — R29818 Other symptoms and signs involving the nervous system: Secondary | ICD-10-CM | POA: Diagnosis not present

## 2017-09-15 DIAGNOSIS — Z79899 Other long term (current) drug therapy: Secondary | ICD-10-CM | POA: Insufficient documentation

## 2017-09-15 DIAGNOSIS — I10 Essential (primary) hypertension: Secondary | ICD-10-CM | POA: Insufficient documentation

## 2017-09-15 DIAGNOSIS — Z7982 Long term (current) use of aspirin: Secondary | ICD-10-CM | POA: Diagnosis not present

## 2017-09-15 DIAGNOSIS — I252 Old myocardial infarction: Secondary | ICD-10-CM | POA: Diagnosis not present

## 2017-09-15 DIAGNOSIS — S3992XA Unspecified injury of lower back, initial encounter: Secondary | ICD-10-CM | POA: Diagnosis present

## 2017-09-15 DIAGNOSIS — Y999 Unspecified external cause status: Secondary | ICD-10-CM | POA: Diagnosis not present

## 2017-09-15 DIAGNOSIS — F17210 Nicotine dependence, cigarettes, uncomplicated: Secondary | ICD-10-CM | POA: Insufficient documentation

## 2017-09-15 DIAGNOSIS — M546 Pain in thoracic spine: Secondary | ICD-10-CM | POA: Diagnosis not present

## 2017-09-15 DIAGNOSIS — S39012A Strain of muscle, fascia and tendon of lower back, initial encounter: Secondary | ICD-10-CM | POA: Diagnosis not present

## 2017-09-15 DIAGNOSIS — I2510 Atherosclerotic heart disease of native coronary artery without angina pectoris: Secondary | ICD-10-CM | POA: Insufficient documentation

## 2017-09-15 DIAGNOSIS — M545 Low back pain: Secondary | ICD-10-CM | POA: Diagnosis not present

## 2017-09-15 DIAGNOSIS — I6789 Other cerebrovascular disease: Secondary | ICD-10-CM | POA: Diagnosis not present

## 2017-09-15 DIAGNOSIS — Z7984 Long term (current) use of oral hypoglycemic drugs: Secondary | ICD-10-CM | POA: Diagnosis not present

## 2017-09-15 DIAGNOSIS — E119 Type 2 diabetes mellitus without complications: Secondary | ICD-10-CM | POA: Insufficient documentation

## 2017-09-15 DIAGNOSIS — Y939 Activity, unspecified: Secondary | ICD-10-CM | POA: Diagnosis not present

## 2017-09-15 MED ORDER — IBUPROFEN 800 MG PO TABS: 800.0000 mg | ORAL_TABLET | Freq: Three times a day (TID) | ORAL | 0 refills | Status: DC

## 2017-09-15 MED ORDER — METHOCARBAMOL 500 MG PO TABS: 500.0000 mg | ORAL_TABLET | Freq: Three times a day (TID) | ORAL | 0 refills | Status: DC | PRN

## 2017-09-15 MED ORDER — IBUPROFEN 200 MG PO TABS
400.0000 mg | ORAL_TABLET | Freq: Once | ORAL | Status: AC
  Administered 2017-09-15: 400 mg via ORAL
  Filled 2017-09-15: qty 2

## 2017-09-15 MED ORDER — METHOCARBAMOL 500 MG PO TABS
500.0000 mg | ORAL_TABLET | Freq: Once | ORAL | Status: AC
  Administered 2017-09-15: 500 mg via ORAL
  Filled 2017-09-15: qty 1
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
## 2017-09-15 NOTE — ED Notes (Signed)
Pt transported to XR.  

## 2017-09-26 DIAGNOSIS — S46811D Strain of other muscles, fascia and tendons at shoulder and upper arm level, right arm, subsequent encounter: Secondary | ICD-10-CM | POA: Diagnosis not present

## 2017-09-26 DIAGNOSIS — M7918 Myalgia, other site: Secondary | ICD-10-CM | POA: Diagnosis not present

## 2017-09-26 DIAGNOSIS — S46812D Strain of other muscles, fascia and tendons at shoulder and upper arm level, left arm, subsequent encounter: Secondary | ICD-10-CM | POA: Diagnosis not present

## 2018-07-15 DIAGNOSIS — M79641 Pain in right hand: Secondary | ICD-10-CM | POA: Insufficient documentation

## 2018-07-15 DIAGNOSIS — M1811 Unilateral primary osteoarthritis of first carpometacarpal joint, right hand: Secondary | ICD-10-CM | POA: Insufficient documentation

## 2018-11-11 ENCOUNTER — Other Ambulatory Visit: Payer: Self-pay

## 2018-11-11 ENCOUNTER — Emergency Department (HOSPITAL_COMMUNITY): Payer: Medicare Other

## 2018-11-11 ENCOUNTER — Emergency Department (HOSPITAL_COMMUNITY)
Admission: EM | Admit: 2018-11-11 | Discharge: 2018-11-12 | Disposition: A | Discharge status: Home / Self Care | Payer: Medicare Other | Attending: Emergency Medicine | Admitting: Emergency Medicine

## 2018-11-11 ENCOUNTER — Encounter (HOSPITAL_COMMUNITY): Payer: Self-pay | Admitting: Emergency Medicine

## 2018-11-11 DIAGNOSIS — Z7982 Long term (current) use of aspirin: Secondary | ICD-10-CM | POA: Diagnosis not present

## 2018-11-11 DIAGNOSIS — Z7984 Long term (current) use of oral hypoglycemic drugs: Secondary | ICD-10-CM | POA: Insufficient documentation

## 2018-11-11 DIAGNOSIS — Y939 Activity, unspecified: Secondary | ICD-10-CM | POA: Diagnosis not present

## 2018-11-11 DIAGNOSIS — Y929 Unspecified place or not applicable: Secondary | ICD-10-CM | POA: Diagnosis not present

## 2018-11-11 DIAGNOSIS — I252 Old myocardial infarction: Secondary | ICD-10-CM | POA: Diagnosis not present

## 2018-11-11 DIAGNOSIS — Y998 Other external cause status: Secondary | ICD-10-CM | POA: Diagnosis not present

## 2018-11-11 DIAGNOSIS — I10 Essential (primary) hypertension: Secondary | ICD-10-CM | POA: Insufficient documentation

## 2018-11-11 DIAGNOSIS — E039 Hypothyroidism, unspecified: Secondary | ICD-10-CM | POA: Insufficient documentation

## 2018-11-11 DIAGNOSIS — E876 Hypokalemia: Secondary | ICD-10-CM | POA: Diagnosis not present

## 2018-11-11 DIAGNOSIS — W19XXXA Unspecified fall, initial encounter: Secondary | ICD-10-CM | POA: Diagnosis not present

## 2018-11-11 DIAGNOSIS — Z79899 Other long term (current) drug therapy: Secondary | ICD-10-CM | POA: Diagnosis not present

## 2018-11-11 DIAGNOSIS — E119 Type 2 diabetes mellitus without complications: Secondary | ICD-10-CM | POA: Insufficient documentation

## 2018-11-11 DIAGNOSIS — M79604 Pain in right leg: Secondary | ICD-10-CM | POA: Insufficient documentation

## 2018-11-11 DIAGNOSIS — F17210 Nicotine dependence, cigarettes, uncomplicated: Secondary | ICD-10-CM | POA: Diagnosis not present

## 2018-11-11 LAB — CBC WITH DIFFERENTIAL/PLATELET
Abs Immature Granulocytes: 0.02 10*3/uL (ref 0.00–0.07)
Basophils Absolute: 0 10*3/uL (ref 0.0–0.1)
Basophils Relative: 0 %
Eosinophils Absolute: 0.2 10*3/uL (ref 0.0–0.5)
Eosinophils Relative: 2 %
HCT: 38.1 % (ref 36.0–46.0)
Hemoglobin: 12 g/dL (ref 12.0–15.0)
Immature Granulocytes: 0 %
Lymphocytes Relative: 31 %
Lymphs Abs: 3.1 10*3/uL (ref 0.7–4.0)
MCH: 26.4 pg (ref 26.0–34.0)
MCHC: 31.5 g/dL (ref 30.0–36.0)
MCV: 83.7 fL (ref 80.0–100.0)
Monocytes Absolute: 0.8 10*3/uL (ref 0.1–1.0)
Monocytes Relative: 8 %
Neutro Abs: 6 10*3/uL (ref 1.7–7.7)
Neutrophils Relative %: 59 %
Platelets: 276 10*3/uL (ref 150–400)
RBC: 4.55 MIL/uL (ref 3.87–5.11)
RDW: 15.1 % (ref 11.5–15.5)
WBC: 10.2 10*3/uL (ref 4.0–10.5)
nRBC: 0 % (ref 0.0–0.2)

## 2018-11-11 LAB — BASIC METABOLIC PANEL
Anion gap: 6 (ref 5–15)
BUN: 18 mg/dL (ref 8–23)
CO2: 30 mmol/L (ref 22–32)
Calcium: 9.7 mg/dL (ref 8.9–10.3)
Chloride: 102 mmol/L (ref 98–111)
Creatinine, Ser: 0.72 mg/dL (ref 0.44–1.00)
GFR calc Af Amer: >60 mL/min (ref >60)
GFR calc non Af Amer: >60 mL/min (ref >60)
Glucose, Bld: 92 mg/dL (ref 70–99)
Potassium: 3.3 mmol/L — ABNORMAL LOW (ref 3.5–5.1)
Sodium: 138 mmol/L (ref 135–145)

## 2018-11-11 LAB — TROPONIN I (HIGH SENSITIVITY)
Troponin I (High Sensitivity): 11 ng/L (ref <18)
Troponin I (High Sensitivity): 11 ng/L (ref <18)

## 2018-11-11 LAB — D-DIMER, QUANTITATIVE: D-Dimer, Quant: 0.4 ug/mL-FEU (ref 0.00–0.50)

## 2018-11-11 MED ORDER — SODIUM CHLORIDE 0.9 % IV BOLUS
1000.0000 mL | Freq: Once | INTRAVENOUS | Status: AC
  Administered 2018-11-11: 1000 mL via INTRAVENOUS

## 2018-11-11 MED ORDER — POTASSIUM CHLORIDE CRYS ER 20 MEQ PO TBCR
20.0000 meq | EXTENDED_RELEASE_TABLET | Freq: Once | ORAL | Status: AC
  Administered 2018-11-11: 20 meq via ORAL
  Filled 2018-11-11: qty 1

## 2018-11-11 MED ORDER — KETOROLAC TROMETHAMINE 30 MG/ML IJ SOLN: 15.0000 mg | Freq: Once | INTRAMUSCULAR | Status: DC

## 2018-11-11 MED ORDER — DICLOFENAC SODIUM 1 % TD GEL: 2.0000 g | Freq: Four times a day (QID) | TRANSDERMAL | 0 refills | Status: DC

## 2018-11-11 MED ORDER — KETOROLAC TROMETHAMINE 15 MG/ML IJ SOLN
15.0000 mg | Freq: Once | INTRAMUSCULAR | Status: DC
  Filled 2018-11-11: qty 1

## 2018-11-11 MED ORDER — KETOROLAC TROMETHAMINE 15 MG/ML IJ SOLN: 15.0000 mg | Freq: Once | INTRAMUSCULAR | Status: DC

## 2018-11-11 MED ORDER — KETOROLAC TROMETHAMINE 30 MG/ML IJ SOLN
15.0000 mg | Freq: Once | INTRAMUSCULAR | Status: AC
  Administered 2018-11-11: 15 mg via INTRAMUSCULAR

## 2018-11-11 NOTE — ED Notes (Signed)
Pt given hot pack for infiltrated IV

## 2018-11-11 NOTE — ED Provider Notes (Signed)
Received patient at signout from [facility].  Refer to provider note for full history and physical examination.  Briefly, patient is a 68 year old female with history of CAD, depression, diabetes, fibromyalgia, hypertension presenting to the ED for evaluation of right posterior thigh pain after mechanical fall 1.5 weeks ago.  Imaging here has been negative.  Work-up thus far has been reassuring but she is waiting on a second troponin.  She will be set up for outpatient DVT study in the morning.  Plan to discharge if repeat troponin is reassuring.    MDM  Second troponin is unchanged.  Vital signs have improved.  Patient stable for discharge at this time.        Renita Papa, PA-C 11/12/18 0940    Drenda Freeze, MD 11/12/18 [phone]

## 2018-11-11 NOTE — ED Triage Notes (Signed)
Patient reports that she was standing on the bed changing a light bulb and fell off of the bed x 1 week. Patient c/o right hip and right groin pain. Patient states she went to her PCP office and was told it was muscle pain. Patient states her pain is worsening.

## 2018-11-11 NOTE — ED Provider Notes (Signed)
[facility]-EMERGENCY DEPT Provider Note   CSN: [id number] Arrival date & time: 11/11/18  1623     History   Chief Complaint Chief Complaint  Patient presents with  . Fall    HPI Stacey Fletcher is a 68 y.o. female with PMHx CAD, depression, diabetes, fibromyalgia, HTN who presents to the ED today landing of gradual onset, constant, worsening pain to her right posterior thigh status post fall that occurred 1.5 weeks ago.  Patient states she was standing on the bed changing a light bulb when she fell landing directly onto her butt.  No head injury or loss of consciousness.  She was seen at her PCPs office and was told it was a "muscle pain."  She states "they did not do anything for my symptoms."  Presents today with worsening pain.  No overlying skin changes including ecchymosis or erythema.  No history of DVT/PE.  No recent prolonged travel or immobilization.  No hemoptysis.  Patient is not on any estrogen therapy.    Asked that she has been having chest pain due to tachycardia she reports that she has been having intermittent chest pain for the past week as well.  Denies nausea, vomiting, shortness of breath.        Past Medical History:  Diagnosis Date  . Coronary artery disease   . Depression   . Diabetes mellitus   . Fibromyalgia   . Hypertension   . MI (myocardial infarction) (HCC)   . Osteoarthritis     Patient Active Problem List   Diagnosis Date Noted  . ARF (acute renal failure) (HCC) 08/20/2014  . Chest pain 05/06/2013  . Nausea vomiting and diarrhea 05/06/2013  . Fever 05/06/2013  . HTN (hypertension) 05/06/2013  . Hypothyroidism 05/06/2013  . Diabetes mellitus (HCC) 05/06/2013    Past Surgical History:  Procedure Laterality Date  . ABDOMINAL HYSTERECTOMY    . BREAST SURGERY    . CARDIAC CATHETERIZATION     1995     OB History   No obstetric history on file.      Home Medications    Prior to Admission medications    Medication Sig Start Date End Date Taking? Authorizing Provider  amLODipine (NORVASC) 10 MG tablet Take 10 mg by mouth daily. 06/03/11  Frederic Jericho, MD  aspirin 81 MG chewable tablet Chew 81 mg by mouth daily.    Yes [provider]  atenolol-chlorthalidone (TENORETIC) 50-25 MG per tablet Take 1 tablet by mouth every morning. 04/06/14  Yes [provider]  buPROPion (WELLBUTRIN SR) 150 MG 12 hr tablet Take 150 mg by mouth 2 (two) times daily. 06/03/11  Yes Azalia Bilis, MD  etodolac (LODINE) 500 MG tablet Take 500 mg by mouth 2 (two) times daily as needed for pain. 09/12/18  Yes [provider]  glipiZIDE (GLUCOTROL XL) 2.5 MG 24 hr tablet Take 2.5 mg by mouth daily. 10/31/18  Yes [provider]  KLOR-CON M20 20 MEQ tablet Take 20 mEq by mouth daily. 07/06/14  Yes [provider]  levothyroxine (SYNTHROID, LEVOTHROID) 50 MCG tablet Take 50 mcg by mouth daily before breakfast.   Yes [provider]  metFORMIN (GLUCOPHAGE) 1000 MG tablet Take 1,000 mg by mouth 2 (two) times daily. 05/11/14  Yes [provider]  PAZEO 0.7 % SOLN Place 1 drop into both eyes daily. 11/04/18  Yes [provider]  pravastatin (PRAVACHOL) 20 MG tablet Take 20 mg by mouth daily. 11/05/18  Yes [provider]  Specialty Vitamins Products (KIDNEY PO) Take 3 tablets by mouth every evening. Kidney factors   Yes [provider]  venlafaxine (EFFEXOR) 75 MG tablet Take 1 tablet (75 mg total) by mouth 3 (three) times daily with meals. 08/06/16  Yes Muthersbaugh, Dahlia Client, PA-C  albuterol (PROVENTIL HFA;VENTOLIN HFA) 108 (90 Base) MCG/ACT inhaler Inhale 2 puffs into the lungs every 6 (six) hours as needed for wheezing or shortness of breath. Patient not taking: Reported on 11/11/2018 05/27/15   Lorre Nick, MD  amoxicillin (AMOXIL) 500 MG capsule Take 2 capsules (1,000 mg total) by mouth 2 (two) times daily. Patient not taking: Reported on 11/11/2018  11/21/16   Pisciotta, Joni Reining, PA-C  diclofenac sodium (VOLTAREN) 1 % GEL Apply 2 g topically 4 (four) times daily. 11/11/18   Hyman Hopes, Mikail Goostree, PA-C  ibuprofen (ADVIL,MOTRIN) 800 MG tablet Take 1 tablet (800 mg total) by mouth 3 (three) times daily. Patient not taking: Reported on 11/11/2018 09/15/17   Mesner, Barbara Cower, MD  methocarbamol (ROBAXIN) 500 MG tablet Take 1 tablet (500 mg total) by mouth every 8 (eight) hours as needed for muscle spasms. Patient not taking: Reported on 11/11/2018 09/15/17   Mesner, Barbara Cower, MD  polyethylene glycol powder (GLYCOLAX/MIRALAX) powder Take 17 g by mouth 2 (two) times daily. Patient not taking: Reported on 11/11/2018 08/06/16   Muthersbaugh, Dahlia Client, PA-C  potassium chloride (K-DUR) 10 MEQ tablet Take 1 tablet (10 mEq total) by mouth 2 (two) times daily. Patient not taking: Reported on 11/11/2018 09/20/15 11/11/18  Alvira Monday, MD    Family History Family History  Problem Relation Age of Onset  . Hypertension Mother   . Diabetes Mother   . Stroke Mother   . Hypertension Other   . Diabetes Other     Social History Social History   Tobacco Use  . Smoking status: Current Every Day Smoker    Packs/day: 0.15    Types: Cigarettes  . Smokeless tobacco: Never Used  . Tobacco comment: three cigarettes/ day  Substance Use Topics  . Alcohol use: No  . Drug use: No     Allergies   Ace inhibitors and Procardia [nifedipine]   Review of Systems Review of Systems  Constitutional: Negative for chills and fever.  HENT: Negative for congestion.   Eyes: Negative for visual disturbance.  Respiratory: Negative for cough and shortness of breath.   Cardiovascular: Positive for chest pain.  Gastrointestinal: Negative for abdominal pain, nausea and vomiting.  Genitourinary: Negative for difficulty urinating.  Musculoskeletal: Positive for arthralgias.  Skin: Negative for color change and wound.  Neurological: Negative for syncope and headaches.     Physical  Exam Updated Vital Signs BP 114/80 (BP Location: Left Arm)   Pulse (!) 113   Temp 99.9 F (37.7 C) (Oral)   Resp (!) 24   Ht 5\' 10"  (1.778 m)   Wt 106.6 kg   SpO2 99%   BMI 33.72 kg/m   Physical Exam Vitals signs and nursing note reviewed.  Constitutional:      Appearance: She is obese. She is not ill-appearing.  HENT:     Head: Normocephalic and atraumatic.  Eyes:     Conjunctiva/sclera: Conjunctivae normal.     Pupils: Pupils are equal, round, and reactive to light.  Neck:     Musculoskeletal: Neck supple.  Cardiovascular:     Rate and Rhythm: Regular rhythm. Tachycardia present.     Pulses: Normal pulses.  Pulmonary:     Effort: Pulmonary effort is normal.  Breath sounds: Normal breath sounds. No wheezing, rhonchi or rales.  Abdominal:     Palpations: Abdomen is soft.     Tenderness: There is no abdominal tenderness. There is no guarding or rebound.  Musculoskeletal:     Comments: No C, T, or L midline spinal tenderness. No diffuse back tenderness on exam. No right hip tenderness. Tenderness mostly present to the posterior aspect of the right thigh. No overlying skin changes including ecchymosis, erythema, edema. No pain to the right calf. Leg does not appear more swollen compared to left. Strength 4/5 compared to left. Sensation intact throughout. 2+ DP and PT pulse.   Skin:    General: Skin is warm and dry.  Neurological:     Mental Status: She is alert.      ED Treatments / Results  Labs (all labs ordered are listed, but only abnormal results are displayed) Labs Reviewed  BASIC METABOLIC PANEL - Abnormal; Notable for the following components:      Result Value   Potassium 3.3 (*)    All other components within normal limits  CBC WITH DIFFERENTIAL/PLATELET  D-DIMER, QUANTITATIVE ([facility])  CBC WITH DIFFERENTIAL/PLATELET  TROPONIN I (HIGH SENSITIVITY)  TROPONIN I (HIGH SENSITIVITY)    EKG EKG Interpretation  Date/Time:  Tuesday November 11 2018  19:53:51 EDT Ventricular Rate:  106 PR Interval:    QRS Duration: 99 QT Interval:  373 QTC Calculation: 496 R Axis:   53 Text Interpretation:  Sinus tachycardia Borderline prolonged QT interval Baseline wander in lead(s) V2 Confirmed by Virgina NorfolkAdam, [place] [phone]([id number]) on 11/11/2018 7:57:28 PM   Radiology Dg Chest 2 View  Result Date: 11/11/2018 CLINICAL DATA:  Chest pain.  Recent fall off bed. EXAM: CHEST - 2 VIEW COMPARISON:  11/21/2016 FINDINGS: The heart size and mediastinal contours are within normal limits. Both lungs are clear. No evidence of pneumothorax or pleural effusion. Mild thoracic spine degenerative changes noted. IMPRESSION: Stable exam.  No active cardiopulmonary disease. Electronically Signed   By: Danae OrleansJohn A Stahl M.D.   On: 11/11/2018 20:45   Dg Hip Unilat  With Pelvis 2-3 Views Right  Result Date: 11/11/2018 CLINICAL DATA:  68 year old who fell out of her bed approximately 2 weeks ago, with progressively worsening RIGHT hip pain since that time. Initial imaging encounter. EXAM: DG HIP (WITH OR WITHOUT PELVIS) 2-3V RIGHT COMPARISON:  None. FINDINGS: No evidence of acute or subacute fracture. Well-preserved joint space. Well-preserved bone mineral density. Included AP pelvis demonstrates a well-preserved joint space in the contralateral LEFT hip. Sacroiliac joints and symphysis pubis anatomically aligned without significant degenerative changes. IMPRESSION: Normal examination. Electronically Signed   By: Hulan Saashomas  Lawrence M.D.   On: 11/11/2018 18:43   Dg Femur 1v Right  Result Date: 11/11/2018 CLINICAL DATA:  68 year old who was standing on her bed changing a light bulb approximately 1 week ago when she fell. Persistent RIGHT UPPER leg pain. EXAM: RIGHT FEMUR 1 VIEW COMPARISON:  No prior RIGHT femur x-rays. RIGHT hip x-rays obtained earlier same day are correlated. FINDINGS: AP view of the femur demonstrates no fractures. Severe osteoarthritis is present involving the knee joint, with severe  MEDIAL compartment joint space narrowing and moderate LATERAL compartment joint space narrowing. Bone mineral density well-preserved. IMPRESSION: 1. No acute osseous abnormality. 2. Severe osteoarthritis involving the RIGHT knee joint. Electronically Signed   By: Hulan Saashomas  Lawrence M.D.   On: 11/11/2018 20:48    Procedures Procedures (including critical care time)  Medications Ordered in ED Medications  sodium  chloride 0.9 % bolus 1,000 mL (has no administration in time range)  potassium chloride SA (KLOR-CON) CR tablet 20 mEq (has no administration in time range)  ketorolac (TORADOL) 15 MG/ML injection 15 mg (has no administration in time range)     Initial Impression / Assessment and Plan / ED Course  I have reviewed the triage vital signs and the nursing notes.  Pertinent labs & imaging results that were available during my care of the patient were reviewed by me and considered in my medical decision making (see chart for details).  Clinical Course as of Nov 10 2233  Tue Nov 11, 2018  2147 D-Dimer, Quant: 0.40 [MV]    Clinical Course User Index [MV] Stacey Fletcher, [place]   68 year old female who presents to the ED today initially complaining of right thigh pain.  She did have a right hip x-ray prior to being seen which was negative.  Her pain is mostly in the posterior aspect of the thigh status post fall 1.5 weeks ago.  Does not have any tenderness to the posterior calf however patient is tachycardic in the 110s.  Does not have history of DVT or PE.  We do not currently have ultrasound at this time but will have patient return in the morning for DVT study.  She does report she has had chest pain intermittently for the past week.  Given chest pain and tachycardia as well as leg injury I am concerned for DVT and PE.  Will obtain baseline blood work today including d-dimer and troponin as well as EKG and chest x-ray.   CXR negative. X ray femur negative as well. EKG without ischemic  changes.   No leukocytosis today. Potassium mildly decreased at 3.3; will replete with 20 mg KDUR. Initial trop of 11. D dimer negative. Do not feel pt needs CTA at this time. Pt continues to be tachycardic; have ordered fluids for her.   Awaiting repeat trop. Heart score 5. Pt not having active chest pain. She appears very comfortable on repeat exam. Do not feel she needs to be admitted for chest pain work up.   10:33 PM At shift change case signed out to [facility], PA-C, who will dispo patient once repeat trop returns.       Final Clinical Impressions(s) / ED Diagnoses   Final diagnoses:  Fall, initial encounter  Right leg pain    ED Discharge Orders         Ordered    LE VENOUS     11/11/18 2228    diclofenac sodium (VOLTAREN) 1 % GEL  4 times daily     11/11/18 2234           Stacey Maize, PA-C 11/11/18 2235    Lennice Sites, DO 11/11/18 2350

## 2018-11-11 NOTE — Discharge Instructions (Addendum)
Your xrays and labwork were all very reassuring today Potassium was very mildly low today. We have repleted this in the ED. Please follow up with your PCP to have this level rechecked in 1 week  Please return to [facility] tomorrow for your outpatient ultrasound to rule out a blood clot in your right leg I have prescribed topical medication for your leg pain. Please use as prescribed.  Follow up with your PCP regarding your ED visit today

## 2018-11-12 ENCOUNTER — Ambulatory Visit (HOSPITAL_BASED_OUTPATIENT_CLINIC_OR_DEPARTMENT_OTHER)
Admission: RE | Admit: 2018-11-12 | Discharge: 2018-11-12 | Disposition: A | Discharge status: Home / Self Care | Payer: Medicare Other | Source: Ambulatory Visit | Attending: Medical | Admitting: Medical

## 2018-11-12 DIAGNOSIS — R52 Pain, unspecified: Secondary | ICD-10-CM

## 2018-11-12 DIAGNOSIS — M79604 Pain in right leg: Secondary | ICD-10-CM | POA: Diagnosis not present

## 2018-11-12 NOTE — Progress Notes (Signed)
RLE venous duplex       has been completed. Preliminary results can be found under CV proc through chart review. Glenisha Gundry, BS, RDMS, RVT   

## 2018-11-13 ENCOUNTER — Other Ambulatory Visit: Payer: Self-pay | Admitting: Family Medicine

## 2018-11-13 DIAGNOSIS — Z1231 Encounter for screening mammogram for malignant neoplasm of breast: Secondary | ICD-10-CM

## 2018-12-08 ENCOUNTER — Encounter: Payer: Self-pay | Admitting: Orthopedic Surgery

## 2018-12-08 ENCOUNTER — Ambulatory Visit (INDEPENDENT_AMBULATORY_CARE_PROVIDER_SITE_OTHER): Payer: Medicare Other | Admitting: Orthopedic Surgery

## 2018-12-08 ENCOUNTER — Other Ambulatory Visit: Payer: Self-pay

## 2018-12-08 VITALS — Ht 70.0 in | Wt 235.0 lb

## 2018-12-08 DIAGNOSIS — M5431 Sciatica, right side: Secondary | ICD-10-CM | POA: Diagnosis not present

## 2018-12-08 MED ORDER — PREDNISONE 10 MG PO TABS: 10.0000 mg | ORAL_TABLET | Freq: Every day | ORAL | 0 refills | Status: DC

## 2018-12-08 MED ORDER — METHOCARBAMOL 750 MG PO TABS: 750.0000 mg | ORAL_TABLET | Freq: Three times a day (TID) | ORAL | 1 refills | Status: DC | PRN

## 2018-12-08 NOTE — Progress Notes (Signed)
Office Visit Note   Patient: Stacey Fletcher           Date of Birth: [birth year]           MRN: [id number] Visit Date: 12/08/2018              Requested by: Renaye Rakers, MD [address] [place],  [place] [address] PCP: Renaye Rakers, MD  Chief Complaint  Patient presents with  . Right Hip - Pain, New Patient (Initial Visit)  . Right Leg - Pain      HPI: Patient is a 68 year old woman who presents with lower back pain radiating to the right buttocks and radiating down the lateral aspect of the right leg down to her foot.  Patient states this all started when she fell a month ago.  Patient had radiographs of the right femur and right hip which have been read and are normal.  Patient states she is diabetic her hemoglobin A1c is 7.3.  Patient denies any pain with sitting.  Pain worse with walking with pain in the buttocks radiating down the lateral aspect of the right leg.  Patient states she was putting a light bulb while standing on her bed when she fell.  Assessment & Plan: Visit Diagnoses:  1. Back pain with right-sided sciatica     Plan: We will start her on a low-dose prednisone 10 mg with breakfast.  Discussed to check her sugars that this may elevate her blood sugars.  We will follow-up in a week discussed that we will get 2 view radiographs of the lumbar spine at follow-up and may need to order an MRI scan.  Follow-Up Instructions: Return in about 1 week (around 12/15/2018).   Ortho Exam  Patient is alert, oriented, no adenopathy, well-dressed, normal affect, normal respiratory effort. Examination patient has no pain with range of motion of the hip knee or ankle she does have some crepitation with range of motion of her knee.  She does have a positive straight leg raise on the right.  She has no focal motor weakness in either lower extremity hip flexion of both legs is symmetrically weak she states this is secondary to the arthritis in her knee.  Imaging: No results  found. No images are attached to the encounter.  Labs: Lab Results  Component Value Date   ESRSEDRATE 13 08/03/2009   REPTSTATUS 05/11/2013 FINAL 05/07/2013   CULT  05/07/2013    NO SALMONELLA, SHIGELLA, CAMPYLOBACTER, YERSINIA, OR E.COLI 0157:H7 ISOLATED Performed at [facility]   LABORGA ESCHERICHIA COLI 05/06/2013     Lab Results  Component Value Date   ALBUMIN 4.3 08/05/2016   ALBUMIN 4.5 09/20/2015   ALBUMIN 3.8 08/20/2014    Lab Results  Component Value Date   MG 1.7 05/06/2013   MG 1.9 10/20/2011   Lab Results  Component Value Date   VD25OH 30 03/01/2009    No results found for: PREALBUMIN CBC EXTENDED Latest Ref Rng & Units 11/11/2018 08/05/2016 09/20/2015  WBC 4.0 - 10.5 K/uL 10.2 11.8(H) 9.7  RBC 3.87 - 5.11 MIL/uL 4.55 4.79 4.79  HGB 12.0 - 15.0 g/dL 56.2 13.0 86.5  HCT 78.4 - 46.0 % 38.1 38.5 39.5  PLT 150 - 400 K/uL 276 310 326  NEUTROABS 1.7 - 7.7 K/uL 6.0 6.4 -  LYMPHSABS 0.7 - 4.0 K/uL 3.1 4.3(H) -     Body mass index is 33.72 kg/m.  Orders:  No orders of the defined types were placed  in this encounter.  Meds ordered this encounter  Medications  . methocarbamol (ROBAXIN) 750 MG tablet    Sig: Take 1 tablet (750 mg total) by mouth every 8 (eight) hours as needed for muscle spasms.    Dispense:  20 tablet    Refill:  1  . predniSONE (DELTASONE) 10 MG tablet    Sig: Take 1 tablet (10 mg total) by mouth daily with breakfast.    Dispense:  60 tablet    Refill:  0     Procedures: No procedures performed  Clinical Data: No additional findings.  ROS:  All other systems negative, except as noted in the HPI. Review of Systems  Objective: Vital Signs: Ht 5\' 10"  (1.778 m)   Wt 235 lb (106.6 kg)   BMI 33.72 kg/m   Specialty Comments:  No specialty comments available.  PMFS History: Patient Active Problem List   Diagnosis Date Noted  . ARF (acute renal failure) (HCC) 08/20/2014  . Chest pain 05/06/2013  . Nausea vomiting  and diarrhea 05/06/2013  . Fever 05/06/2013  . HTN (hypertension) 05/06/2013  . Hypothyroidism 05/06/2013  . Diabetes mellitus (HCC) 05/06/2013   Past Medical History:  Diagnosis Date  . Coronary artery disease   . Depression   . Diabetes mellitus   . Fibromyalgia   . Hypertension   . MI (myocardial infarction) (HCC)   . Osteoarthritis     Family History  Problem Relation Age of Onset  . Hypertension Mother   . Diabetes Mother   . Stroke Mother   . Hypertension Other   . Diabetes Other     Past Surgical History:  Procedure Laterality Date  . ABDOMINAL HYSTERECTOMY    . BREAST SURGERY    . CARDIAC CATHETERIZATION     1995   Social History   Occupational History  . Not on file  Tobacco Use  . Smoking status: Current Every Day Smoker    Packs/day: 0.15    Types: Cigarettes  . Smokeless tobacco: Never Used  . Tobacco comment: three cigarettes/ day  Substance and Sexual Activity  . Alcohol use: No  . Drug use: No  . Sexual activity: Not Currently

## 2018-12-15 ENCOUNTER — Ambulatory Visit (INDEPENDENT_AMBULATORY_CARE_PROVIDER_SITE_OTHER): Payer: Medicare Other | Admitting: Orthopedic Surgery

## 2018-12-15 ENCOUNTER — Ambulatory Visit: Payer: Self-pay

## 2018-12-15 ENCOUNTER — Encounter: Payer: Self-pay | Admitting: Orthopedic Surgery

## 2018-12-15 ENCOUNTER — Other Ambulatory Visit: Payer: Self-pay

## 2018-12-15 VITALS — Ht 70.0 in | Wt 235.0 lb

## 2018-12-15 DIAGNOSIS — M5431 Sciatica, right side: Secondary | ICD-10-CM | POA: Diagnosis not present

## 2018-12-15 DIAGNOSIS — Z20822 Contact with and (suspected) exposure to covid-19: Secondary | ICD-10-CM

## 2018-12-15 MED ORDER — CYCLOBENZAPRINE HCL 10 MG PO TABS: 10.0000 mg | ORAL_TABLET | Freq: Three times a day (TID) | ORAL | 0 refills | Status: DC | PRN

## 2018-12-15 NOTE — Progress Notes (Signed)
Office Visit Note   Patient: Stacey Fletcher           Date of Birth: 1950/11/26           MRN: [id number] Visit Date: 12/15/2018              Requested by: Renaye Rakers, MD [address] [place],  [place] [address] PCP: Soundra Pilon, FNP  Chief Complaint  Patient presents with  . Lower Back - Pain, Follow-up      HPI: Patient is a 68 year old woman who presents in follow-up for right-sided radicular pain with pain worse down the lateral aspect of the right leg to the right lateral right calf.  The radicular pain is worse than back pain.  Patient states she does not have pain with sitting but does have pain with standing.  Patient states she is still smoking.  Patient states she got dizzy with taking the prednisone for 2 days and stopped taking it.  Assessment & Plan: Visit Diagnoses:  1. Back pain with right-sided sciatica     Plan: Discussed the importance of smoking cessation and the correlation with back pain and smoking.  We have ordered an MRI scan and will follow-up after the MRI is obtained.  A prescription was called in for Flexeril.  Follow-Up Instructions: No follow-ups on file.   Ortho Exam  Patient is alert, oriented, no adenopathy, well-dressed, normal affect, normal respiratory effort. Examination patient ambulates with a walker.  She has a positive straight leg raise on the right.  She has no focal motor weakness and both lower extremities have good motor strength.  Imaging: Xr Lumbar Spine 2-3 Views  Result Date: 12/15/2018 2 view radiographs of the lumbar spine shows degenerative disc disease throughout the lumbar spine with joint space narrowing without spondylolisthesis.  No images are attached to the encounter.  Labs: Lab Results  Component Value Date   ESRSEDRATE 13 08/03/2009   REPTSTATUS 05/11/2013 FINAL 05/07/2013   CULT  05/07/2013    NO SALMONELLA, SHIGELLA, CAMPYLOBACTER, YERSINIA, OR E.COLI 0157:H7 ISOLATED Performed at [facility]   LABORGA ESCHERICHIA COLI 05/06/2013     Lab Results  Component Value Date   ALBUMIN 4.3 08/05/2016   ALBUMIN 4.5 09/20/2015   ALBUMIN 3.8 08/20/2014    Lab Results  Component Value Date   MG 1.7 05/06/2013   MG 1.9 10/20/2011   Lab Results  Component Value Date   VD25OH 30 03/01/2009    No results found for: PREALBUMIN CBC EXTENDED Latest Ref Rng & Units 11/11/2018 08/05/2016 09/20/2015  WBC 4.0 - 10.5 K/uL 10.2 11.8(H) 9.7  RBC 3.87 - 5.11 MIL/uL 4.55 4.79 4.79  HGB 12.0 - 15.0 g/dL 19.1 47.8 29.5  HCT 62.1 - 46.0 % 38.1 38.5 39.5  PLT 150 - 400 K/uL 276 310 326  NEUTROABS 1.7 - 7.7 K/uL 6.0 6.4 -  LYMPHSABS 0.7 - 4.0 K/uL 3.1 4.3(H) -     Body mass index is 33.72 kg/m.  Orders:  Orders Placed This Encounter  Procedures  . XR Lumbar Spine 2-3 Views  . MR Lumbar Spine w/o contrast   Meds ordered this encounter  Medications  . cyclobenzaprine (FLEXERIL) 10 MG tablet    Sig: Take 1 tablet (10 mg total) by mouth 3 (three) times daily as needed for muscle spasms.    Dispense:  30 tablet    Refill:  0     Procedures: No procedures performed  Clinical Data:  No additional findings.  ROS:  All other systems negative, except as noted in the HPI. Review of Systems  Objective: Vital Signs: Ht 5\' 10"  (1.778 m)   Wt 235 lb (106.6 kg)   BMI 33.72 kg/m   Specialty Comments:  No specialty comments available.  PMFS History: Patient Active Problem List   Diagnosis Date Noted  . ARF (acute renal failure) (HCC) 08/20/2014  . Chest pain 05/06/2013  . Nausea vomiting and diarrhea 05/06/2013  . Fever 05/06/2013  . HTN (hypertension) 05/06/2013  . Hypothyroidism 05/06/2013  . Diabetes mellitus (HCC) 05/06/2013   Past Medical History:  Diagnosis Date  . Coronary artery disease   . Depression   . Diabetes mellitus   . Fibromyalgia   . Hypertension   . MI (myocardial infarction) (HCC)   . Osteoarthritis     Family History  Problem Relation  Age of Onset  . Hypertension Mother   . Diabetes Mother   . Stroke Mother   . Hypertension Other   . Diabetes Other     Past Surgical History:  Procedure Laterality Date  . ABDOMINAL HYSTERECTOMY    . BREAST SURGERY    . CARDIAC CATHETERIZATION     1995   Social History   Occupational History  . Not on file  Tobacco Use  . Smoking status: Current Every Day Smoker    Packs/day: 0.15    Types: Cigarettes  . Smokeless tobacco: Never Used  . Tobacco comment: three cigarettes/ day  Substance and Sexual Activity  . Alcohol use: No  . Drug use: No  . Sexual activity: Not Currently

## 2018-12-16 LAB — NOVEL CORONAVIRUS, NAA: SARS-CoV-2, NAA: NOT DETECTED

## 2018-12-17 ENCOUNTER — Telehealth: Payer: Self-pay | Admitting: Family Medicine

## 2018-12-17 NOTE — Telephone Encounter (Signed)
Negative COVID results given. Patient results "NOT Detected." Caller expressed understanding. ° °

## 2018-12-31 ENCOUNTER — Ambulatory Visit
Admission: RE | Admit: 2018-12-31 | Discharge: 2018-12-31 | Disposition: A | Discharge status: Home / Self Care | Payer: Medicare Other | Source: Ambulatory Visit | Attending: Family Medicine | Admitting: Family Medicine

## 2018-12-31 ENCOUNTER — Other Ambulatory Visit: Payer: Self-pay

## 2018-12-31 DIAGNOSIS — Z1231 Encounter for screening mammogram for malignant neoplasm of breast: Secondary | ICD-10-CM

## 2019-01-10 ENCOUNTER — Ambulatory Visit
Admission: RE | Admit: 2019-01-10 | Discharge: 2019-01-10 | Disposition: A | Discharge status: Home / Self Care | Payer: Medicare Other | Source: Ambulatory Visit | Attending: Orthopedic Surgery | Admitting: Orthopedic Surgery

## 2019-01-10 ENCOUNTER — Other Ambulatory Visit: Payer: Self-pay

## 2019-01-10 DIAGNOSIS — M5431 Sciatica, right side: Secondary | ICD-10-CM

## 2019-01-14 ENCOUNTER — Other Ambulatory Visit: Payer: Self-pay | Admitting: Orthopedic Surgery

## 2019-01-14 ENCOUNTER — Telehealth: Payer: Self-pay

## 2019-01-14 ENCOUNTER — Telehealth: Payer: Self-pay | Admitting: *Deleted

## 2019-01-14 ENCOUNTER — Other Ambulatory Visit: Payer: Self-pay

## 2019-01-14 DIAGNOSIS — M5431 Sciatica, right side: Secondary | ICD-10-CM

## 2019-01-14 MED ORDER — METHOCARBAMOL 750 MG PO TABS: 750.0000 mg | ORAL_TABLET | Freq: Three times a day (TID) | ORAL | 3 refills | Status: DC | PRN

## 2019-01-14 NOTE — Telephone Encounter (Signed)
Called and discussed MRI results with pt and made referral to [facility] for ESI. She said she would like a refill on her Robaxin said that this did would for her slightly and would like to have refill on this. Please advise.

## 2019-01-14 NOTE — Telephone Encounter (Signed)
rx sent

## 2019-01-15 ENCOUNTER — Other Ambulatory Visit: Payer: Self-pay | Admitting: Physical Medicine and Rehabilitation

## 2019-01-15 DIAGNOSIS — F411 Generalized anxiety disorder: Secondary | ICD-10-CM

## 2019-01-15 MED ORDER — DIAZEPAM 5 MG PO TABS: ORAL_TABLET | ORAL | 0 refills | Status: DC

## 2019-01-15 NOTE — Progress Notes (Signed)
Pre-procedure diazepam ordered for pre-operative anxiety.  

## 2019-01-15 NOTE — Telephone Encounter (Signed)
Called pt and advised.  

## 2019-01-15 NOTE — Telephone Encounter (Signed)
done

## 2019-02-09 ENCOUNTER — Encounter: Payer: Medicare Other | Admitting: Physical Medicine and Rehabilitation

## 2019-02-18 ENCOUNTER — Other Ambulatory Visit: Payer: Self-pay

## 2019-02-18 ENCOUNTER — Ambulatory Visit (INDEPENDENT_AMBULATORY_CARE_PROVIDER_SITE_OTHER): Payer: Medicare Other | Admitting: Physical Medicine and Rehabilitation

## 2019-02-18 ENCOUNTER — Telehealth: Payer: Self-pay | Admitting: Orthopedic Surgery

## 2019-02-18 ENCOUNTER — Encounter: Payer: Self-pay | Admitting: Physical Medicine and Rehabilitation

## 2019-02-18 VITALS — BP 135/92 | HR 118 | Ht 70.0 in | Wt 230.0 lb

## 2019-02-18 DIAGNOSIS — M4316 Spondylolisthesis, lumbar region: Secondary | ICD-10-CM

## 2019-02-18 DIAGNOSIS — M48062 Spinal stenosis, lumbar region with neurogenic claudication: Secondary | ICD-10-CM | POA: Diagnosis not present

## 2019-02-18 DIAGNOSIS — M5416 Radiculopathy, lumbar region: Secondary | ICD-10-CM | POA: Diagnosis not present

## 2019-02-18 DIAGNOSIS — M797 Fibromyalgia: Secondary | ICD-10-CM | POA: Diagnosis not present

## 2019-02-18 MED ORDER — METHOCARBAMOL 750 MG PO TABS: 750.0000 mg | ORAL_TABLET | Freq: Three times a day (TID) | ORAL | 3 refills | Status: DC | PRN

## 2019-02-18 MED ORDER — CYCLOBENZAPRINE HCL 10 MG PO TABS: 10.0000 mg | ORAL_TABLET | Freq: Three times a day (TID) | ORAL | 0 refills | Status: DC | PRN

## 2019-02-18 NOTE — Telephone Encounter (Signed)
Patient is here. She would like a refill on robaxin and flexeril. Her call back number is [phone]

## 2019-02-18 NOTE — Progress Notes (Signed)
 .  Numeric Pain Rating Scale and Functional Assessment Average Pain 5 Pain Right Now 5 My pain is constant, sharp and stabbing Pain is worse with: walking, bending, standing and some activites Pain improves with: medication   In the last MONTH (on 0-10 scale) has pain interfered with the following?  1. General activity like being  able to carry out your everyday physical activities such as walking, climbing stairs, carrying groceries, or moving a chair?  Rating(8)  2. Relation with others like being able to carry out your usual social activities and roles such as  activities at home, at work and in your community. Rating(8)  3. Enjoyment of life such that you have  been bothered by emotional problems such as feeling anxious, depressed or irritable?  Rating(7)

## 2019-02-18 NOTE — Telephone Encounter (Signed)
Rx has been refilled for Flexeril and Robaxin.

## 2019-02-20 ENCOUNTER — Telehealth: Payer: Self-pay | Admitting: Physical Medicine and Rehabilitation

## 2019-02-20 NOTE — Telephone Encounter (Signed)
Pt came in to the office wanting to let dr.newton know she does have her medication Diazepam and would like for more to be prescribed for after procedure

## 2019-02-23 ENCOUNTER — Ambulatory Visit: Payer: Self-pay

## 2019-02-23 ENCOUNTER — Ambulatory Visit (INDEPENDENT_AMBULATORY_CARE_PROVIDER_SITE_OTHER): Payer: Medicare Other | Admitting: Physical Medicine and Rehabilitation

## 2019-02-23 ENCOUNTER — Other Ambulatory Visit: Payer: Self-pay

## 2019-02-23 ENCOUNTER — Encounter: Payer: Self-pay | Admitting: Physical Medicine and Rehabilitation

## 2019-02-23 VITALS — BP 126/75 | HR 100 | Ht 70.0 in | Wt 230.0 lb

## 2019-02-23 DIAGNOSIS — M5416 Radiculopathy, lumbar region: Secondary | ICD-10-CM

## 2019-02-23 MED ORDER — METHYLPREDNISOLONE ACETATE 80 MG/ML IJ SUSP
40.0000 mg | Freq: Once | INTRAMUSCULAR | Status: AC
  Administered 2019-02-23: 40 mg

## 2019-02-23 NOTE — Telephone Encounter (Signed)
Please advise 

## 2019-02-23 NOTE — Progress Notes (Signed)
Numeric Pain Rating Scale and Functional Assessment Average Pain 8   In the last MONTH (on 0-10 scale) has pain interfered with the following?  1. General activity like being  able to carry out your everyday physical activities such as walking, climbing stairs, carrying groceries, or moving a chair? yes Rating(10-with picking up something heavy)   +Driver, -BT, -Dye Allergies.

## 2019-02-23 NOTE — Progress Notes (Signed)
Stacey Fletcher - 69 y.o. female MRN [id number]  Date of birth: November 27, 1950  Office Visit Note: Visit Date: 02/23/2019 PCP: Soundra Pilon, FNP Referred by: Soundra Pilon, FNP  Subjective: Chief Complaint  Patient presents with  . Lower Back - Pain   HPI:  Stacey Fletcher is a 69 y.o. female who comes in today HPI ROS Otherwise per HPI.  Assessment & Plan: Visit Diagnoses:  1. Lumbar radiculopathy     Plan: No additional findings.   Meds & Orders:  Meds ordered this encounter  Medications  . methylPREDNISolone acetate (DEPO-MEDROL) injection 40 mg    Orders Placed This Encounter  Procedures  . XR C-ARM NO REPORT  . Epidural Steroid injection    Follow-up: Return if symptoms worsen or fail to improve.   Procedures: No procedures performed  Lumbar Epidural Steroid Injection - Interlaminar Approach with Fluoroscopic Guidance  Patient: Stacey Fletcher      Date of Birth: Jun 26, 1950 MRN: [id number] PCP: Soundra Pilon, FNP      Visit Date: 02/23/2019   Universal Protocol:     Consent Given By: the patient  Position: PRONE  Additional Comments: Vital signs were monitored before and after the procedure. Patient was prepped and draped in the usual sterile fashion. The correct patient, procedure, and site was verified.   Injection Procedure Details:  Procedure Site One Meds Administered:  Meds ordered this encounter  Medications  . methylPREDNISolone acetate (DEPO-MEDROL) injection 40 mg     Laterality: Right  Location/Site: She does not appear to have a transitional S1 segment on x-ray more visible than MRI. L5-S1  Needle size: 22 G  Needle type: spinal needle  Needle Placement: Paramedian epidural  Findings:   -Comments: Excellent flow of contrast into the epidural space.  Procedure Details: Using a paramedian approach from the side mentioned above, the region overlying the inferior lamina was localized under fluoroscopic visualization and  the soft tissues overlying this structure were infiltrated with 4 ml. of 1% Lidocaine without Epinephrine. The Tuohy needle was inserted into the epidural space using a paramedian approach.   The epidural space was localized using loss of resistance along with lateral and bi-planar fluoroscopic views.  After negative aspirate for air, blood, and CSF, a 2 ml. volume of Isovue-250 was injected into the epidural space and the flow of contrast was observed. Radiographs were obtained for documentation purposes.    The injectate was administered into the level noted above.   Additional Comments:  The patient tolerated the procedure well Dressing: 2 x 2 sterile gauze and Band-Aid    Post-procedure details: Patient was observed during the procedure. Post-procedure instructions were reviewed.  Patient left the clinic in stable condition.    Clinical History: MRI LUMBAR SPINE WITHOUT CONTRAST  TECHNIQUE: Multiplanar, multisequence MR imaging of the lumbar spine was performed. No intravenous contrast was administered.  COMPARISON:  Radiography 12/15/2018  FINDINGS: Segmentation: 5 lumbar type vertebral bodies with a somewhat transitional S1.  Alignment:  3 mm degenerative anterolisthesis L4-5.  Vertebrae:  No fracture or primary bone lesion.  Conus medullaris and cauda equina: Conus extends to the L1 level. Conus and cauda equina appear normal.  Paraspinal and other soft tissues: Negative  Disc levels:  No significant finding at L2-3 or above.  L3-4: Mild bulging of the disc. Mild facet hypertrophy. No compressive stenosis.  L4-5: Bilateral facet arthropathy with edematous change. 3 mm of degenerative anterolisthesis because of this. Circumferential protrusion of the disc. Severe spinal  stenosis which could cause neural compression on either or both sides. Bilateral foraminal stenosis as well.  L5-S1: Bilateral facet arthropathy but without slippage.  Shallow disc herniation more prominent towards the left. Spinal stenosis at this level which has potential for neural compression in the lateral recesses and foramina, though not as severe is at the L4-5 level.  IMPRESSION: L4-5: Severe multifactorial spinal stenosis. Advanced bilateral facet arthropathy with edematous change. 3 mm of anterolisthesis. Shallow protrusion of the disc. Bilateral neural foraminal stenosis.  L5-S1: Stenosis of the lateral recesses and neural foramina that could cause neural compression. Bilateral facet arthropathy but without slippage. Shallow disc herniation more prominent towards the left.  L3-4: Disc bulge and mild facet hypertrophy but without compressive stenosis.   Electronically Signed   By: Paulina Fusi M.D.   On: 01/11/2019 13:21     Objective:  VS:  HT:5\' 10"  (177.8 cm)   WT:230 lb (104.3 kg)  BMI:33    BP:126/75  HR:100bpm  TEMP: ( )  RESP:  Physical Exam  Ortho Exam Imaging: XR C-ARM NO REPORT  Result Date: 02/23/2019 Please see Notes tab for imaging impression.

## 2019-02-23 NOTE — Telephone Encounter (Signed)
Only for pre-procedure anxiety not after

## 2019-02-23 NOTE — Procedures (Signed)
Lumbar Epidural Steroid Injection - Interlaminar Approach with Fluoroscopic Guidance  Patient: Stacey Fletcher      Date of Birth: [birth year] MRN: [id number] PCP: Soundra Pilon, FNP      Visit Date: 02/23/2019   Universal Protocol:     Consent Given By: the patient  Position: PRONE  Additional Comments: Vital signs were monitored before and after the procedure. Patient was prepped and draped in the usual sterile fashion. The correct patient, procedure, and site was verified.   Injection Procedure Details:  Procedure Site One Meds Administered:  Meds ordered this encounter  Medications  . methylPREDNISolone acetate (DEPO-MEDROL) injection 40 mg     Laterality: Right  Location/Site: She does not appear to have a transitional S1 segment on x-ray more visible than MRI. L5-S1  Needle size: 22 G  Needle type: spinal needle  Needle Placement: Paramedian epidural  Findings:   -Comments: Excellent flow of contrast into the epidural space.  Procedure Details: Using a paramedian approach from the side mentioned above, the region overlying the inferior lamina was localized under fluoroscopic visualization and the soft tissues overlying this structure were infiltrated with 4 ml. of 1% Lidocaine without Epinephrine. The Tuohy needle was inserted into the epidural space using a paramedian approach.   The epidural space was localized using loss of resistance along with lateral and bi-planar fluoroscopic views.  After negative aspirate for air, blood, and CSF, a 2 ml. volume of Isovue-250 was injected into the epidural space and the flow of contrast was observed. Radiographs were obtained for documentation purposes.    The injectate was administered into the level noted above.   Additional Comments:  The patient tolerated the procedure well Dressing: 2 x 2 sterile gauze and Band-Aid    Post-procedure details: Patient was observed during the procedure. Post-procedure  instructions were reviewed.  Patient left the clinic in stable condition.

## 2019-04-12 ENCOUNTER — Ambulatory Visit: Payer: Medicare Other | Attending: Internal Medicine

## 2019-04-12 DIAGNOSIS — Z23 Encounter for immunization: Secondary | ICD-10-CM | POA: Insufficient documentation

## 2019-04-12 NOTE — Progress Notes (Signed)
Covid-19 Vaccination Clinic  Name:  Stacey Fletcher    MRN: [id number] DOB: 04/10/1950  04/12/2019  [name] was observed post Covid-19 immunization for 30 minutes based on pre-vaccination screening without incident. She was provided with Vaccine Information Sheet and instruction to access the V-Safe system.   [name] was instructed to call 911 with any severe reactions post vaccine: Marland Kitchen Difficulty breathing  . Swelling of face and throat  . A fast heartbeat  . A bad rash all over body  . Dizziness and weakness   Immunizations Administered    Name Date Dose VIS Date Route   Pfizer COVID-19 Vaccine 04/12/2019  3:36 PM 0.3 mL 01/16/2019 Intramuscular   Manufacturer: [employer], [employer]   Lot: [id number]   NDC: [address]-8

## 2019-04-18 ENCOUNTER — Other Ambulatory Visit: Payer: Self-pay | Admitting: Orthopedic Surgery

## 2019-04-21 ENCOUNTER — Telehealth: Payer: Self-pay | Admitting: Physical Medicine and Rehabilitation

## 2019-04-22 NOTE — Telephone Encounter (Signed)
Patient's son wanted to make an appointment to discuss options. Scheduled for OV on 3/24 at 1000.

## 2019-04-22 NOTE — Telephone Encounter (Signed)
Could try L4 tf esi to see if it would last longer or referral to [facility]/neurosurg for decompression of L4-5 stenosis

## 2019-04-23 NOTE — Progress Notes (Signed)
Stacey Fletcher - 69 y.o. female MRN [id number]  Date of birth: 16-Aug-1950  Office Visit Note: Visit Date: 02/18/2019 PCP: Soundra Pilon, FNP Referred by: Soundra Pilon, FNP  Subjective: Chief Complaint  Patient presents with  . Lower Back - Pain  . Right Leg - Pain  . Left Leg - Pain  . Left Foot - Pain  . Right Foot - Pain   HPI: Stacey Fletcher is a 69 y.o. female who comes in today At the request of Dr. Aldean Baker for evaluation management of worsening severe low back pain with now bilateral radicular leg pain right much more prominent than left.  She was actually seen back in November by Dr. Lajoyce Corners with more acute onset over the last couple months before the visit with a history of a fall while trying to change a light bulb while standing on her bed.  There was no broken bones or other trauma.  He did look at x-rays at the time showing small listhesis but otherwise normal spine.  She went on to have prednisone as well as activity modification and home exercise.  Her case is complicated by diabetes with complication renal failure and obesity.  She reports that the prednisone did raise her blood sugars but it did help to a degree.  Ultimately because the symptoms persisted MRI of the lumbar spine was performed and this is reviewed today with patient with spine models and the images.  Report is reviewed and shown below.  This essentially shows grade 1 listhesis of L4 on L5 with severe multifactorial stenosis at L5-S1 there is some lateral recess narrowing with some epidural lipomatosis.  She is noted mainly pain down the right leg to the foot and more of an L5 and S1 distribution with similar complaints now on the left is not as strong.  She notes no focal weakness but feels weak at times.  She has had no prior lumbar surgery.  She had prior trial of tramadol before seeing Dr. Lajoyce Corners and this did not seem to help as much.  This represents a chronic problem with exacerbation of worsening over  the last 6 to 7 months.  Review of Systems  Constitutional: Negative for chills, fever, malaise/fatigue and weight loss.  HENT: Negative for hearing loss and sinus pain.   Eyes: Negative for blurred vision, double vision and photophobia.  Respiratory: Negative for cough and shortness of breath.   Cardiovascular: Negative for chest pain, palpitations and leg swelling.  Gastrointestinal: Negative for abdominal pain, nausea and vomiting.  Genitourinary: Negative for flank pain.  Musculoskeletal: Positive for back pain and joint pain. Negative for myalgias.       Right more than left hip and leg pain  Skin: Negative for itching and rash.  Neurological: Negative for tremors, focal weakness and weakness.  Endo/Heme/Allergies: Negative.   Psychiatric/Behavioral: Negative for depression.  All other systems reviewed and are negative.  Otherwise per HPI.  Assessment & Plan: Visit Diagnoses:  1. Lumbar radiculopathy   2. Spinal stenosis of lumbar region with neurogenic claudication   3. Spondylolisthesis of lumbar region   4. Fibromyalgia     Plan: Findings:  Chronic worsening severe with exacerbation of right hip and leg pain and chronic back pain which is now a 5 out of 10 on average and is constant sharp and stabbing pain.  It is somewhat worse with walking and bending.  Somewhat better at rest.  She reports it does go down to both feet.  She does not have any diagnosis known of neuropathy although she does have diabetes.  She has not had any electrodiagnostic study of that.  She does carry a diagnosis of fibromyalgia.  I think the best approach was her failing conservative care as a diagnostic and hopefully therapeutic L5-S1 interlaminar epidural steroid injection.  Would consider transforaminal approach at L4 depending on results.  Unfortunately she may be looking at possible surgical referral to the severe stenosis at L4-5.  Alternative treatments are referral for chronic pain management with  medication treatment although that is going to be somewhat problematic with the severe stenosis.    Meds & Orders: No orders of the defined types were placed in this encounter.  No orders of the defined types were placed in this encounter.   Follow-up: Return for Right L5-S1 interlaminar epidural steroid injection.   Procedures: No procedures performed  No notes on file   Clinical History: MRI LUMBAR SPINE WITHOUT CONTRAST  TECHNIQUE: Multiplanar, multisequence MR imaging of the lumbar spine was performed. No intravenous contrast was administered.  COMPARISON:  Radiography 12/15/2018  FINDINGS: Segmentation: 5 lumbar type vertebral bodies with a somewhat transitional S1.  Alignment:  3 mm degenerative anterolisthesis L4-5.  Vertebrae:  No fracture or primary bone lesion.  Conus medullaris and cauda equina: Conus extends to the L1 level. Conus and cauda equina appear normal.  Paraspinal and other soft tissues: Negative  Disc levels:  No significant finding at L2-3 or above.  L3-4: Mild bulging of the disc. Mild facet hypertrophy. No compressive stenosis.  L4-5: Bilateral facet arthropathy with edematous change. 3 mm of degenerative anterolisthesis because of this. Circumferential protrusion of the disc. Severe spinal stenosis which could cause neural compression on either or both sides. Bilateral foraminal stenosis as well.  L5-S1: Bilateral facet arthropathy but without slippage. Shallow disc herniation more prominent towards the left. Spinal stenosis at this level which has potential for neural compression in the lateral recesses and foramina, though not as severe is at the L4-5 level.  IMPRESSION: L4-5: Severe multifactorial spinal stenosis. Advanced bilateral facet arthropathy with edematous change. 3 mm of anterolisthesis. Shallow protrusion of the disc. Bilateral neural foraminal stenosis.  L5-S1: Stenosis of the lateral recesses and neural  foramina that could cause neural compression. Bilateral facet arthropathy but without slippage. Shallow disc herniation more prominent towards the left.  L3-4: Disc bulge and mild facet hypertrophy but without compressive stenosis.   Electronically Signed   By: Paulina Fusi M.D.   On: 01/11/2019 13:21   She reports that she has been smoking cigarettes. She has been smoking about 0.15 packs per day. She has never used smokeless tobacco. No results for input(s): HGBA1C, LABURIC in the last 8760 hours.  Objective:  VS:  HT:5\' 10"  (177.8 cm)   WT:230 lb (104.3 kg)  BMI:33    BP:(!) 135/92  HR:(!) 118bpm  TEMP: ( )  RESP:  Physical Exam Vitals and nursing note reviewed.  Constitutional:      General: She is not in acute distress.    Appearance: Normal appearance. She is well-developed. She is obese.  HENT:     Head: Normocephalic and atraumatic.     Nose: Nose normal.     Mouth/Throat:     Mouth: Mucous membranes are moist.     Pharynx: Oropharynx is clear.  Eyes:     Conjunctiva/sclera: Conjunctivae normal.     Pupils: Pupils are equal, round, and reactive to light.  Cardiovascular:  Rate and Rhythm: Regular rhythm.  Pulmonary:     Effort: Pulmonary effort is normal. No respiratory distress.  Abdominal:     General: There is no distension.     Palpations: Abdomen is soft.     Tenderness: There is no guarding.  Musculoskeletal:        General: Tenderness present. No deformity or signs of injury.     Cervical back: Normal range of motion and neck supple.     Right lower leg: Edema present.     Left lower leg: Edema present.     Comments: Patient is slow to rise from a seated position with pain on extension and facet loading.  She has tenderness across the lower back and PSIS and greater trochanters in a generalized tender point type of issue.  She has no hip pain with rotation she has good distal strength.  Skin:    General: Skin is warm and dry.     Findings: No  erythema or rash.  Neurological:     General: No focal deficit present.     Mental Status: She is alert and oriented to person, place, and time.     Motor: No abnormal muscle tone.     Coordination: Coordination normal.     Gait: Gait normal.  Psychiatric:        Mood and Affect: Mood normal.        Behavior: Behavior normal.        Thought Content: Thought content normal.     Ortho Exam Imaging: No results found.  Past Medical/Family/Surgical/Social History: Medications & Allergies reviewed per EMR, new medications updated. Patient Active Problem List   Diagnosis Date Noted  . ARF (acute renal failure) (HCC) 08/20/2014  . Chest pain 05/06/2013  . Nausea vomiting and diarrhea 05/06/2013  . Fever 05/06/2013  . HTN (hypertension) 05/06/2013  . Hypothyroidism 05/06/2013  . Diabetes mellitus (HCC) 05/06/2013   Past Medical History:  Diagnosis Date  . Coronary artery disease   . Depression   . Diabetes mellitus   . Fibromyalgia   . Hypertension   . MI (myocardial infarction) (HCC)   . Osteoarthritis    Family History  Problem Relation Age of Onset  . Hypertension Mother   . Diabetes Mother   . Stroke Mother   . Hypertension Other   . Diabetes Other    Past Surgical History:  Procedure Laterality Date  . ABDOMINAL HYSTERECTOMY    . BREAST SURGERY    . CARDIAC CATHETERIZATION     1995   Social History   Occupational History  . Not on file  Tobacco Use  . Smoking status: Current Every Day Smoker    Packs/day: 0.15    Types: Cigarettes  . Smokeless tobacco: Never Used  . Tobacco comment: three cigarettes/ day  Substance and Sexual Activity  . Alcohol use: No  . Drug use: No  . Sexual activity: Not Currently

## 2019-04-29 ENCOUNTER — Ambulatory Visit (INDEPENDENT_AMBULATORY_CARE_PROVIDER_SITE_OTHER): Payer: Medicare Other | Admitting: Physical Medicine and Rehabilitation

## 2019-04-29 ENCOUNTER — Other Ambulatory Visit: Payer: Self-pay

## 2019-04-29 ENCOUNTER — Other Ambulatory Visit: Payer: Self-pay | Admitting: Orthopedic Surgery

## 2019-04-29 ENCOUNTER — Encounter: Payer: Self-pay | Admitting: Physical Medicine and Rehabilitation

## 2019-04-29 VITALS — BP 118/73 | HR 103 | Ht 70.0 in | Wt 240.0 lb

## 2019-04-29 DIAGNOSIS — G8929 Other chronic pain: Secondary | ICD-10-CM | POA: Diagnosis not present

## 2019-04-29 DIAGNOSIS — M5416 Radiculopathy, lumbar region: Secondary | ICD-10-CM | POA: Diagnosis not present

## 2019-04-29 DIAGNOSIS — M48062 Spinal stenosis, lumbar region with neurogenic claudication: Secondary | ICD-10-CM

## 2019-04-29 DIAGNOSIS — M5441 Lumbago with sciatica, right side: Secondary | ICD-10-CM | POA: Diagnosis not present

## 2019-04-29 NOTE — Progress Notes (Signed)
 .  Numeric Pain Rating Scale and Functional Assessment Average Pain 9 Pain Right Now 8 My pain is constant, stabbing and aching Pain is worse with: walking, standing and some activites Pain improves with: nothing   In the last MONTH (on 0-10 scale) has pain interfered with the following?  1. General activity like being  able to carry out your everyday physical activities such as walking, climbing stairs, carrying groceries, or moving a chair?  Rating(9)  2. Relation with others like being able to carry out your usual social activities and roles such as  activities at home, at work and in your community. Rating(9)  3. Enjoyment of life such that you have  been bothered by emotional problems such as feeling anxious, depressed or irritable?  Rating(8)

## 2019-04-30 ENCOUNTER — Encounter: Payer: Self-pay | Admitting: Physical Medicine and Rehabilitation

## 2019-04-30 NOTE — Telephone Encounter (Signed)
Last filled 12/2018 do you wish to refill

## 2019-04-30 NOTE — Progress Notes (Signed)
Stacey Fletcher - 69 y.o. female MRN [id number]  Date of birth: 11/09/1950  Office Visit Note: Visit Date: 04/29/2019 PCP: Soundra Pilon, FNP Referred by: Soundra Pilon, FNP  Subjective: Chief Complaint  Patient presents with  . Lower Back - Pain  . Right Leg - Pain   HPI: Stacey Fletcher is a 69 y.o. female who comes in today For evaluation and management of chronic severe low back pain with exacerbation of severe right radicular leg pain most predominantly in L5 distribution.  She is present today with her son who wanted to attend here what was going on with his mother and we agreed to do that.  A brief review of her history but the further details can be seen in our prior notes is that she began having low back and right hip and leg pain in the early fall 2020 without specific injury.  She sees Dr. Aldean Baker from orthopedic standpoint and after thorough work-up and treatment conservatively he did obtain MRI of the lumbar spine.  Lumbar spine shows pretty significant severe stenosis at L4-5 and also stenosis at L5-S1 just less severe.  This is multifactorial but mostly Duda arthritic changes of the joints.  She has very small 1 to 2 mm listhesis at L4-5.  Mild facet joint gaping at that level as well.  She comes in today after L5-S1 interlaminar epidural steroid injection that gave her 100% relief of her pain and she was ecstatic for about a month.  Symptoms slowly returned.  She now is back to probably where she started.  Even though we had gone over this thoroughly she was still somewhat confused about what would be the next step and how to manage this and I think her son was concerned as well.  Her pain is 9 out of 10 at this point with constant stabbing and aching pain worse with walking better with sitting and rest.  Nothing else is really touch the pain at this point.  Her case is complicated by obesity and non-insulin-dependent diabetes.  I had a long talk with both the patient and  her son today we spoke for over 30 minutes about her condition and typical treatment plans and outcomes.  She has had no focal weakness no bowel or bladder changes no other red flag complaints.  Review of Systems  Constitutional: Negative for chills, fever, malaise/fatigue and weight loss.  HENT: Negative for hearing loss and sinus pain.   Eyes: Negative for blurred vision, double vision and photophobia.  Respiratory: Negative for cough and shortness of breath.   Cardiovascular: Negative for chest pain, palpitations and leg swelling.  Gastrointestinal: Negative for abdominal pain, nausea and vomiting.  Genitourinary: Negative for flank pain.  Musculoskeletal: Positive for back pain and joint pain. Negative for myalgias.  Skin: Negative for itching and rash.  Neurological: Positive for tingling. Negative for tremors, focal weakness and weakness.  Endo/Heme/Allergies: Negative.   Psychiatric/Behavioral: Negative for depression.  All other systems reviewed and are negative.  Otherwise per HPI.  Assessment & Plan: Visit Diagnoses:  1. Spinal stenosis of lumbar region with neurogenic claudication   2. Lumbar radiculopathy   3. Chronic bilateral low back pain with right-sided sciatica     Plan: Findings:  Chronic history of almost 6 months of low back and radicular pain on the right with good diagnostic relief after L5-S1 interlaminar injection but with lasting benefit for about a month.  I did explain to the patient that we see  this quite frequently and is not an indication that another injection would last longer.  I also talked about a transforaminal approach that sometimes can work better although they do flip-flop at times and there is not a clear scientific or reported reason why 1 would be better than the other.  We also talked about surgical intervention for this.  At this point we are going to complete a transforaminal injection diagnostically and therapeutically at L4-5 probably  bilaterally but depending on her symptoms may consider just right side.  We are also going to make referral to Dr. Vira Browns for consultation and information on surgical approach for decompression.  Hopefully this could be just a decompression laminectomy and not fusion.  Obviously he would be making that decision and I did explain all that to the patient and she does want that from an information standpoint.  She will continue with current medications would consider adjunct of medication treatment.    Meds & Orders: No orders of the defined types were placed in this encounter.   Orders Placed This Encounter  Procedures  . Ambulatory referral to Orthopedic Surgery    Follow-up: Return for Bilateral L4 transforaminal epidural steroid injection.   Procedures: No procedures performed  No notes on file   Clinical History: MRI LUMBAR SPINE WITHOUT CONTRAST  TECHNIQUE: Multiplanar, multisequence MR imaging of the lumbar spine was performed. No intravenous contrast was administered.  COMPARISON:  Radiography 12/15/2018  FINDINGS: Segmentation: 5 lumbar type vertebral bodies with a somewhat transitional S1.  Alignment:  3 mm degenerative anterolisthesis L4-5.  Vertebrae:  No fracture or primary bone lesion.  Conus medullaris and cauda equina: Conus extends to the L1 level. Conus and cauda equina appear normal.  Paraspinal and other soft tissues: Negative  Disc levels:  No significant finding at L2-3 or above.  L3-4: Mild bulging of the disc. Mild facet hypertrophy. No compressive stenosis.  L4-5: Bilateral facet arthropathy with edematous change. 3 mm of degenerative anterolisthesis because of this. Circumferential protrusion of the disc. Severe spinal stenosis which could cause neural compression on either or both sides. Bilateral foraminal stenosis as well.  L5-S1: Bilateral facet arthropathy but without slippage. Shallow disc herniation more prominent  towards the left. Spinal stenosis at this level which has potential for neural compression in the lateral recesses and foramina, though not as severe is at the L4-5 level.  IMPRESSION: L4-5: Severe multifactorial spinal stenosis. Advanced bilateral facet arthropathy with edematous change. 3 mm of anterolisthesis. Shallow protrusion of the disc. Bilateral neural foraminal stenosis.  L5-S1: Stenosis of the lateral recesses and neural foramina that could cause neural compression. Bilateral facet arthropathy but without slippage. Shallow disc herniation more prominent towards the left.  L3-4: Disc bulge and mild facet hypertrophy but without compressive stenosis.   Electronically Signed   By: Paulina Fusi M.D.   On: 01/11/2019 13:21   She reports that she has been smoking cigarettes. She has been smoking about 0.15 packs per day. She has never used smokeless tobacco. No results for input(s): HGBA1C, LABURIC in the last 8760 hours.  Objective:  VS:  HT:5\' 10"  (177.8 cm)   WT:240 lb (108.9 kg)  BMI:34.44    BP:118/73  HR:(!) 103bpm  TEMP: ( )  RESP:  Physical Exam Vitals and nursing note reviewed.  Constitutional:      General: She is not in acute distress.    Appearance: Normal appearance. She is well-developed. She is obese. She is not ill-appearing.  HENT:  Head: Normocephalic and atraumatic.  Eyes:     Conjunctiva/sclera: Conjunctivae normal.     Pupils: Pupils are equal, round, and reactive to light.  Cardiovascular:     Rate and Rhythm: Normal rate.     Pulses: Normal pulses.  Pulmonary:     Effort: Pulmonary effort is normal.  Musculoskeletal:     Right lower leg: No edema.     Left lower leg: No edema.     Comments: Patient slow to rise from seated position to full extension she does have concordant low back pain with extension facet loading.  No pain over the greater trochanters or with hip rotation.  She has good distal strength.  No clonus  Skin:     General: Skin is warm and dry.     Findings: No erythema or rash.  Neurological:     General: No focal deficit present.     Mental Status: She is alert and oriented to person, place, and time.     Sensory: No sensory deficit.     Motor: No abnormal muscle tone.     Coordination: Coordination normal.     Gait: Gait normal.  Psychiatric:        Mood and Affect: Mood normal.        Behavior: Behavior normal.     Ortho Exam Imaging: No results found.  Past Medical/Family/Surgical/Social History: Medications & Allergies reviewed per EMR, new medications updated. Patient Active Problem List   Diagnosis Date Noted  . ARF (acute renal failure) (HCC) 08/20/2014  . Chest pain 05/06/2013  . Nausea vomiting and diarrhea 05/06/2013  . Fever 05/06/2013  . HTN (hypertension) 05/06/2013  . Hypothyroidism 05/06/2013  . Diabetes mellitus (HCC) 05/06/2013   Past Medical History:  Diagnosis Date  . Coronary artery disease   . Depression   . Diabetes mellitus   . Fibromyalgia   . Hypertension   . MI (myocardial infarction) (HCC)   . Osteoarthritis    Family History  Problem Relation Age of Onset  . Hypertension Mother   . Diabetes Mother   . Stroke Mother   . Hypertension Other   . Diabetes Other    Past Surgical History:  Procedure Laterality Date  . ABDOMINAL HYSTERECTOMY    . BREAST SURGERY    . CARDIAC CATHETERIZATION     1995   Social History   Occupational History  . Not on file  Tobacco Use  . Smoking status: Current Every Day Smoker    Packs/day: 0.15    Types: Cigarettes  . Smokeless tobacco: Never Used  . Tobacco comment: three cigarettes/ day  Substance and Sexual Activity  . Alcohol use: No  . Drug use: No  . Sexual activity: Not Currently

## 2019-05-05 ENCOUNTER — Other Ambulatory Visit: Payer: Self-pay | Admitting: Physical Medicine and Rehabilitation

## 2019-05-05 MED ORDER — DIAZEPAM 5 MG PO TABS: ORAL_TABLET | ORAL | 0 refills | Status: DC

## 2019-05-05 NOTE — Telephone Encounter (Signed)
Please advise. Scheduled for [facility] tomorrow.

## 2019-05-06 ENCOUNTER — Ambulatory Visit: Payer: Self-pay

## 2019-05-06 ENCOUNTER — Other Ambulatory Visit: Payer: Self-pay

## 2019-05-06 ENCOUNTER — Ambulatory Visit (INDEPENDENT_AMBULATORY_CARE_PROVIDER_SITE_OTHER): Payer: Medicare Other | Admitting: Physical Medicine and Rehabilitation

## 2019-05-06 VITALS — BP 127/79 | HR 84

## 2019-05-06 DIAGNOSIS — M48062 Spinal stenosis, lumbar region with neurogenic claudication: Secondary | ICD-10-CM

## 2019-05-06 DIAGNOSIS — M5416 Radiculopathy, lumbar region: Secondary | ICD-10-CM

## 2019-05-06 MED ORDER — METHYLPREDNISOLONE ACETATE 80 MG/ML IJ SUSP
40.0000 mg | Freq: Once | INTRAMUSCULAR | Status: AC
  Administered 2019-05-06: 40 mg

## 2019-05-06 NOTE — Progress Notes (Signed)
 .  Numeric Pain Rating Scale and Functional Assessment Average Pain 9   In the last MONTH (on 0-10 scale) has pain interfered with the following?  1. General activity like being  able to carry out your everyday physical activities such as walking, climbing stairs, carrying groceries, or moving a chair?  Rating(9)   +Driver, -BT, -Dye Allergies.  

## 2019-05-06 NOTE — Progress Notes (Signed)
Stacey Fletcher - 69 y.o. female MRN [id number]  Date of birth: 09/07/1950  Office Visit Note: Visit Date: 05/06/2019 PCP: Soundra Pilon, FNP Referred by: Soundra Pilon, FNP  Subjective: No chief complaint on file.  HPI:  Stacey Fletcher is a 69 y.o. female who comes in today For planned bilateral L4 transforaminal epidural steroid injection.  The patient has failed conservative care including home exercise, medications, time and activity modification.  This injection will be diagnostic and hopefully therapeutic.  Please see requesting physician notes for further details and justification.   ROS Otherwise per HPI.  Assessment & Plan: Visit Diagnoses:  1. Spinal stenosis of lumbar region with neurogenic claudication   2. Lumbar radiculopathy     Plan: No additional findings.   Meds & Orders:  Meds ordered this encounter  Medications  . methylPREDNISolone acetate (DEPO-MEDROL) injection 40 mg    Orders Placed This Encounter  Procedures  . XR C-ARM NO REPORT  . Epidural Steroid injection    Follow-up: Return if symptoms worsen or fail to improve.   Procedures: No procedures performed  Lumbosacral Transforaminal Epidural Steroid Injection - Sub-Pedicular Approach with Fluoroscopic Guidance  Patient: Stacey Fletcher      Date of Birth: 03/31/1950 MRN: [id number] PCP: Soundra Pilon, FNP      Visit Date: 05/06/2019   Universal Protocol:    Date/Time: 05/06/2019  Consent Given By: the patient  Position: PRONE  Additional Comments: Vital signs were monitored before and after the procedure. Patient was prepped and draped in the usual sterile fashion. The correct patient, procedure, and site was verified.   Injection Procedure Details:  Procedure Site One Meds Administered:  Meds ordered this encounter  Medications  . methylPREDNISolone acetate (DEPO-MEDROL) injection 40 mg    Laterality: Bilateral  Location/Site:  L4-L5  Needle size: 22 G  Needle  type: Spinal  Needle Placement: Transforaminal  Findings:    -Comments: Excellent flow of contrast along the nerve and into the epidural space.  Procedure Details: After squaring off the end-plates to get a true AP view, the C-arm was positioned so that an oblique view of the foramen as noted above was visualized. The target area is just inferior to the "nose of the scotty dog" or sub pedicular. The soft tissues overlying this structure were infiltrated with 2-3 ml. of 1% Lidocaine without Epinephrine.  The spinal needle was inserted toward the target using a "trajectory" view along the fluoroscope beam.  Under AP and lateral visualization, the needle was advanced so it did not puncture dura and was located close the 6 O'Clock position of the pedical in AP tracterory. Biplanar projections were used to confirm position. Aspiration was confirmed to be negative for CSF and/or blood. A 1-2 ml. volume of Isovue-250 was injected and flow of contrast was noted at each level. Radiographs were obtained for documentation purposes.   After attaining the desired flow of contrast documented above, a 0.5 to 1.0 ml test dose of 0.25% Marcaine was injected into each respective transforaminal space.  The patient was observed for 90 seconds post injection.  After no sensory deficits were reported, and normal lower extremity motor function was noted,   the above injectate was administered so that equal amounts of the injectate were placed at each foramen (level) into the transforaminal epidural space.   Additional Comments:  The patient tolerated the procedure well Dressing: 2 x 2 sterile gauze and Band-Aid    Post-procedure details: Patient was observed during  the procedure. Post-procedure instructions were reviewed.  Patient left the clinic in stable condition.     Clinical History: MRI LUMBAR SPINE WITHOUT CONTRAST  TECHNIQUE: Multiplanar, multisequence MR imaging of the lumbar spine  was performed. No intravenous contrast was administered.  COMPARISON:  Radiography 12/15/2018  FINDINGS: Segmentation: 5 lumbar type vertebral bodies with a somewhat transitional S1.  Alignment:  3 mm degenerative anterolisthesis L4-5.  Vertebrae:  No fracture or primary bone lesion.  Conus medullaris and cauda equina: Conus extends to the L1 level. Conus and cauda equina appear normal.  Paraspinal and other soft tissues: Negative  Disc levels:  No significant finding at L2-3 or above.  L3-4: Mild bulging of the disc. Mild facet hypertrophy. No compressive stenosis.  L4-5: Bilateral facet arthropathy with edematous change. 3 mm of degenerative anterolisthesis because of this. Circumferential protrusion of the disc. Severe spinal stenosis which could cause neural compression on either or both sides. Bilateral foraminal stenosis as well.  L5-S1: Bilateral facet arthropathy but without slippage. Shallow disc herniation more prominent towards the left. Spinal stenosis at this level which has potential for neural compression in the lateral recesses and foramina, though not as severe is at the L4-5 level.  IMPRESSION: L4-5: Severe multifactorial spinal stenosis. Advanced bilateral facet arthropathy with edematous change. 3 mm of anterolisthesis. Shallow protrusion of the disc. Bilateral neural foraminal stenosis.  L5-S1: Stenosis of the lateral recesses and neural foramina that could cause neural compression. Bilateral facet arthropathy but without slippage. Shallow disc herniation more prominent towards the left.  L3-4: Disc bulge and mild facet hypertrophy but without compressive stenosis.   Electronically Signed   By: Paulina Fusi M.D.   On: 01/11/2019 13:21     Objective:  VS:  HT:    WT:   BMI:     BP:127/79  HR:84bpm  TEMP: ( )  RESP:  Physical Exam  Ortho Exam Imaging: XR C-ARM NO REPORT  Result Date: 05/06/2019 Please see Notes tab  for imaging impression.

## 2019-05-06 NOTE — Procedures (Signed)
Lumbosacral Transforaminal Epidural Steroid Injection - Sub-Pedicular Approach with Fluoroscopic Guidance  Patient: Stacey Fletcher      Date of Birth: September 11, 1950 MRN: [id number] PCP: Soundra Pilon, FNP      Visit Date: 05/06/2019   Universal Protocol:    Date/Time: 05/06/2019  Consent Given By: the patient  Position: PRONE  Additional Comments: Vital signs were monitored before and after the procedure. Patient was prepped and draped in the usual sterile fashion. The correct patient, procedure, and site was verified.   Injection Procedure Details:  Procedure Site One Meds Administered:  Meds ordered this encounter  Medications  . methylPREDNISolone acetate (DEPO-MEDROL) injection 40 mg    Laterality: Bilateral  Location/Site:  L4-L5  Needle size: 22 G  Needle type: Spinal  Needle Placement: Transforaminal  Findings:    -Comments: Excellent flow of contrast along the nerve and into the epidural space.  Procedure Details: After squaring off the end-plates to get a true AP view, the C-arm was positioned so that an oblique view of the foramen as noted above was visualized. The target area is just inferior to the "nose of the scotty dog" or sub pedicular. The soft tissues overlying this structure were infiltrated with 2-3 ml. of 1% Lidocaine without Epinephrine.  The spinal needle was inserted toward the target using a "trajectory" view along the fluoroscope beam.  Under AP and lateral visualization, the needle was advanced so it did not puncture dura and was located close the 6 O'Clock position of the pedical in AP tracterory. Biplanar projections were used to confirm position. Aspiration was confirmed to be negative for CSF and/or blood. A 1-2 ml. volume of Isovue-250 was injected and flow of contrast was noted at each level. Radiographs were obtained for documentation purposes.   After attaining the desired flow of contrast documented above, a 0.5 to 1.0 ml test dose  of 0.25% Marcaine was injected into each respective transforaminal space.  The patient was observed for 90 seconds post injection.  After no sensory deficits were reported, and normal lower extremity motor function was noted,   the above injectate was administered so that equal amounts of the injectate were placed at each foramen (level) into the transforaminal epidural space.   Additional Comments:  The patient tolerated the procedure well Dressing: 2 x 2 sterile gauze and Band-Aid    Post-procedure details: Patient was observed during the procedure. Post-procedure instructions were reviewed.  Patient left the clinic in stable condition.

## 2019-05-12 ENCOUNTER — Ambulatory Visit: Payer: Medicare Other | Attending: Internal Medicine

## 2019-05-12 DIAGNOSIS — Z23 Encounter for immunization: Secondary | ICD-10-CM

## 2019-05-12 NOTE — Progress Notes (Signed)
Covid-19 Vaccination Clinic  Name:  Stacey Fletcher    MRN: [id number] DOB: [birth year]  05/12/2019  [name] was observed post Covid-19 immunization for 15 minutes without incident. She was provided with Vaccine Information Sheet and instruction to access the V-Safe system.   [name] was instructed to call 911 with any severe reactions post vaccine: Marland Kitchen Difficulty breathing  . Swelling of face and throat  . A fast heartbeat  . A bad rash all over body  . Dizziness and weakness   Immunizations Administered    Name Date Dose VIS Date Route   Pfizer COVID-19 Vaccine 05/12/2019 12:15 PM 0.3 mL 01/16/2019 Intramuscular   Manufacturer: [employer], [employer]   Lot: [id number]   NDC: [address]-2

## 2019-05-20 ENCOUNTER — Telehealth: Payer: Self-pay | Admitting: Specialist

## 2019-05-20 NOTE — Telephone Encounter (Signed)
This is fine 

## 2019-05-20 NOTE — Telephone Encounter (Signed)
Patient's son Stacey Fletcher called advised he will need to come to the appointment with his mother tomorrow because she has some memory problems and mobility issues. The number to contact Stacey Fletcher is [phone]

## 2019-05-21 ENCOUNTER — Ambulatory Visit (INDEPENDENT_AMBULATORY_CARE_PROVIDER_SITE_OTHER): Payer: Medicare Other | Admitting: Specialist

## 2019-05-21 ENCOUNTER — Encounter: Payer: Self-pay | Admitting: Specialist

## 2019-05-21 ENCOUNTER — Other Ambulatory Visit: Payer: Self-pay

## 2019-05-21 VITALS — BP 113/70 | HR 102 | Ht 70.0 in | Wt 240.0 lb

## 2019-05-21 DIAGNOSIS — M1711 Unilateral primary osteoarthritis, right knee: Secondary | ICD-10-CM

## 2019-05-21 DIAGNOSIS — M1712 Unilateral primary osteoarthritis, left knee: Secondary | ICD-10-CM | POA: Diagnosis not present

## 2019-05-21 DIAGNOSIS — M48062 Spinal stenosis, lumbar region with neurogenic claudication: Secondary | ICD-10-CM

## 2019-05-21 DIAGNOSIS — M4316 Spondylolisthesis, lumbar region: Secondary | ICD-10-CM

## 2019-05-21 NOTE — Progress Notes (Signed)
Office Visit Note   Patient: Stacey Fletcher           Date of Birth: 02/22/1950           MRN: [id number] Visit Date: 05/21/2019              Requested by: Tyrell Antonio, MD [address] [place],  [place] [address] PCP: Soundra Pilon, FNP   Assessment & Plan: Visit Diagnoses:  1. Spondylolisthesis, lumbar region   2. Spinal stenosis of lumbar region with neurogenic claudication   3. Unilateral primary osteoarthritis, left knee   4. Unilateral primary osteoarthritis, right knee     Plan: Avoid bending, stooping and avoid lifting weights greater than 10 lbs. Avoid prolong standing and walking. Avoid frequent bending and stooping  No lifting greater than 10 lbs. May use ice or moist heat for pain. Weight loss is of benefit. Handicap license is approved. Call if the pain recurs call us and we would arrange another epidural steroid injection. Gabapentin 100mg  po qhs  Follow-Up Instructions: No follow-ups on file.   Orders:  No orders of the defined types were placed in this encounter.  No orders of the defined types were placed in this encounter.     Procedures: No procedures performed   Clinical Data: Findings:  CLINICAL DATA: Low back pain with right buttock and hip pain radiating down the leg.  EXAM: MRI LUMBAR SPINE WITHOUT CONTRAST  TECHNIQUE: Multiplanar, multisequence MR imaging of the lumbar spine was performed. No intravenous contrast was administered.  COMPARISON: Radiography 12/15/2018  FINDINGS: Segmentation: 5 lumbar type vertebral bodies with a somewhat transitional S1.  Alignment: 3 mm degenerative anterolisthesis L4-5.  Vertebrae: No fracture or primary bone lesion.  Conus medullaris and cauda equina: Conus extends to the L1 level. Conus and cauda equina appear normal.  Paraspinal and other soft tissues: Negative  Disc levels:  No significant finding at L2-3 or above.  L3-4: Mild bulging of the disc. Mild facet hypertrophy.  No compressive stenosis.  L4-5: Bilateral facet arthropathy with edematous change. 3 mm of degenerative anterolisthesis because of this. Circumferential protrusion of the disc. Severe spinal stenosis which could cause neural compression on either or both sides. Bilateral foraminal stenosis as well.  L5-S1: Bilateral facet arthropathy but without slippage. Shallow disc herniation more prominent towards the left. Spinal stenosis at this level which has potential for neural compression in the lateral recesses and foramina, though not as severe is at the L4-5 level.  IMPRESSION: L4-5: Severe multifactorial spinal stenosis. Advanced bilateral facet arthropathy with edematous change. 3 mm of anterolisthesis. Shallow protrusion of the disc. Bilateral neural foraminal stenosis.  L5-S1: Stenosis of the lateral recesses and neural foramina that could cause neural compression. Bilateral facet arthropathy but without slippage. Shallow disc herniation more prominent towards the left.  L3-4: Disc bulge and mild facet hypertrophy but without compressive stenosis.   Electronically Signed  By: Paulina Fusi M.D.  On: 01/11/2019 13:21    Subjective: Chief Complaint  Patient presents with  . Lower Back - Follow-up    69 year old female with history of fall in early November 2020, 11/9 and had xrays post fall of the lumbar spine. She is having persistent Pain in the right calf and pain with standing and walking. She does grocery shop with electric cart in the store. She was standing and Walking up until November when she fell trying to put a light bulb in over her bed. No bowel difficulty but urgency with  bladder with occasionally with accidental leakage for about 2 years. She has leg pain right side. Since the accident she is still having trouble with the right calf since the accident about one year ago. There is calf pain worse with standing and walking and with trying to sleep there was  pain Now post ESI the pain is better. She is unable to walk a long distance, can walk in the house and to the mail box and back and can make it to the road and back for moving the trash. Back pain is 0 of 10.   Review of Systems  Constitutional: Negative.  Negative for activity change, appetite change, chills, diaphoresis, fatigue, fever and unexpected weight change.  HENT: Positive for congestion, dental problem, hearing loss, sinus pressure, sinus pain and sneezing. Negative for drooling, ear discharge, ear pain, facial swelling, mouth sores, nosebleeds, postnasal drip, rhinorrhea, sore throat, tinnitus, trouble swallowing and voice change.   Eyes: Positive for redness and itching. Negative for photophobia, pain, discharge and visual disturbance.  Respiratory: Negative.  Negative for apnea, cough, choking, chest tightness, shortness of breath, wheezing and stridor.   Cardiovascular: Negative.  Negative for chest pain, palpitations and leg swelling.  Gastrointestinal: Negative.  Negative for abdominal distention, abdominal pain, anal bleeding, blood in stool and constipation.  Endocrine: Positive for cold intolerance. Negative for heat intolerance, polydipsia, polyphagia and polyuria.  Genitourinary: Positive for urgency. Negative for difficulty urinating, dyspareunia, dysuria, enuresis, flank pain, frequency, hematuria, pelvic pain and vaginal bleeding.  Musculoskeletal: Negative for arthralgias, back pain, gait problem, joint swelling, myalgias, neck pain and neck stiffness.  Skin: Negative.  Negative for color change, pallor, rash and wound.  Allergic/Immunologic: Positive for environmental allergies.  Neurological: Positive for dizziness, weakness and light-headedness. Negative for tremors, seizures, syncope, facial asymmetry, speech difficulty and headaches.  Hematological: Negative for adenopathy. Does not bruise/bleed easily.  Psychiatric/Behavioral: Negative for agitation, behavioral  problems, confusion, decreased concentration, dysphoric mood, hallucinations, self-injury, sleep disturbance and suicidal ideas. The patient is not nervous/anxious and is not hyperactive.      Objective: Vital Signs: BP 113/70 (BP Location: Left Arm, Patient Position: Sitting)   Pulse (!) 102   Ht 5\' 10"  (1.778 m)   Wt 240 lb (108.9 kg)   BMI 34.44 kg/m   Physical Exam Constitutional:      Appearance: She is well-developed.  HENT:     Head: Normocephalic and atraumatic.  Eyes:     Pupils: Pupils are equal, round, and reactive to light.  Pulmonary:     Effort: Pulmonary effort is normal.     Breath sounds: Normal breath sounds.  Abdominal:     General: Bowel sounds are normal.     Palpations: Abdomen is soft.  Musculoskeletal:     Cervical back: Normal range of motion and neck supple.     Lumbar back: Negative right straight leg raise test and negative left straight leg raise test.  Skin:    General: Skin is warm and dry.  Neurological:     Mental Status: She is alert and oriented to person, place, and time.  Psychiatric:        Behavior: Behavior normal.        Thought Content: Thought content normal.        Judgment: Judgment normal.     Back Exam   Tenderness  The patient is experiencing tenderness in the lumbar.  Range of Motion  Lateral bend right: abnormal  Lateral bend left: abnormal  Rotation  right: abnormal  Rotation left: abnormal   Muscle Strength  Right Quadriceps:  5/5  Left Quadriceps:  5/5  Right Hamstrings:  5/5  Left Hamstrings:  5/5   Tests  Straight leg raise right: negative Straight leg raise left: negative  Reflexes  Patellar: 2/4 Achilles: 2/4  Other  Erythema: no back redness Scars: absent      Specialty Comments:  No specialty comments available.  Imaging: No results found.   PMFS History: Patient Active Problem List   Diagnosis Date Noted  . ARF (acute renal failure) (HCC) 08/20/2014  . Chest pain 05/06/2013   . Nausea vomiting and diarrhea 05/06/2013  . Fever 05/06/2013  . HTN (hypertension) 05/06/2013  . Hypothyroidism 05/06/2013  . Diabetes mellitus (HCC) 05/06/2013   Past Medical History:  Diagnosis Date  . Coronary artery disease   . Depression   . Diabetes mellitus   . Fibromyalgia   . Hypertension   . MI (myocardial infarction) (HCC)   . Osteoarthritis     Family History  Problem Relation Age of Onset  . Hypertension Mother   . Diabetes Mother   . Stroke Mother   . Hypertension Other   . Diabetes Other     Past Surgical History:  Procedure Laterality Date  . ABDOMINAL HYSTERECTOMY    . BREAST SURGERY    . CARDIAC CATHETERIZATION     1995   Social History   Occupational History  . Not on file  Tobacco Use  . Smoking status: Current Every Day Smoker    Packs/day: 0.15    Types: Cigarettes  . Smokeless tobacco: Never Used  . Tobacco comment: three cigarettes/ day  Substance and Sexual Activity  . Alcohol use: No  . Drug use: No  . Sexual activity: Not Currently

## 2019-05-21 NOTE — Patient Instructions (Signed)
Plan: Avoid bending, stooping and avoid lifting weights greater than 10 lbs. Avoid prolong standing and walking. Avoid frequent bending and stooping  No lifting greater than 10 lbs. May use ice or moist heat for pain. Weight loss is of benefit. Handicap license is approved. Call if the pain recurs call us and we would arrange another epidural steroid injection. Gabapentin 100mg  po qhs

## 2019-06-02 ENCOUNTER — Other Ambulatory Visit: Payer: Self-pay | Admitting: Family

## 2019-06-03 NOTE — Telephone Encounter (Signed)
JN pt 

## 2019-07-02 ENCOUNTER — Other Ambulatory Visit: Payer: Self-pay

## 2019-07-02 ENCOUNTER — Encounter: Payer: Self-pay | Admitting: Specialist

## 2019-07-02 ENCOUNTER — Ambulatory Visit (INDEPENDENT_AMBULATORY_CARE_PROVIDER_SITE_OTHER): Payer: Medicare Other | Admitting: Specialist

## 2019-07-02 VITALS — BP 116/76 | HR 103 | Ht 70.0 in | Wt 240.0 lb

## 2019-07-02 DIAGNOSIS — M1712 Unilateral primary osteoarthritis, left knee: Secondary | ICD-10-CM | POA: Diagnosis not present

## 2019-07-02 DIAGNOSIS — M48062 Spinal stenosis, lumbar region with neurogenic claudication: Secondary | ICD-10-CM

## 2019-07-02 DIAGNOSIS — M4316 Spondylolisthesis, lumbar region: Secondary | ICD-10-CM | POA: Diagnosis not present

## 2019-07-02 DIAGNOSIS — M1711 Unilateral primary osteoarthritis, right knee: Secondary | ICD-10-CM | POA: Diagnosis not present

## 2019-07-02 MED ORDER — HYDROCODONE-ACETAMINOPHEN 5-325 MG PO TABS: 1.0000 | ORAL_TABLET | Freq: Four times a day (QID) | ORAL | 0 refills | Status: DC | PRN

## 2019-07-02 NOTE — Patient Instructions (Signed)
Plan: Avoid bending, stooping and avoid lifting weights greater than 10 lbs. Avoid prolong standing and walking. Order for a new walker with wheels. Surgery scheduling secretary Sherri Billings, will call you in the next week to schedule for surgery.  Surgery recommended is a one level lumbar fusion L4-5 this would be done with rods, screws and cages with local bone graft and allograft (donor bone graft). Take hydrocodone for for pain. Risk of surgery includes risk of infection 1 in 200 patients, bleeding 1/2% chance you would need a transfusion.   Risk to the nerves is one in 10,000. You will need to use a brace for 3 months and wean from the brace on the 4th month. Expect improved walking and standing tolerance. Expect relief of leg pain but numbness may persist depending on the length and degree of pressure that has been present.   

## 2019-07-02 NOTE — Progress Notes (Signed)
Office Visit Note   Patient: Stacey Fletcher           Date of Birth: 01-24-1951           MRN: [id number] Visit Date: 07/02/2019              Requested by: Soundra Pilon, FNP [address] [place],  [place] [address] PCP: Soundra Pilon, FNP   Assessment & Plan: Visit Diagnoses:  1. Spondylolisthesis, lumbar region   2. Spinal stenosis of lumbar region with neurogenic claudication   3. Unilateral primary osteoarthritis, right knee   4. Unilateral primary osteoarthritis, left knee     Plan: Avoid bending, stooping and avoid lifting weights greater than 10 lbs. Avoid prolong standing and walking. Order for a new walker with wheels. Surgery scheduling secretary Tivis Ringer, will call you in the next week to schedule for surgery.  Surgery recommended is a one level lumbar fusion L4-5 this would be done with rods, screws and cages with local bone graft and allograft (donor bone graft). Take hydrocodone for for pain. Risk of surgery includes risk of infection 1 in 200 patients, bleeding 1/2% chance you would need a transfusion.   Risk to the nerves is one in 10,000. You will need to use a brace for 3 months and wean from the brace on the 4th month. Expect improved walking and standing tolerance. Expect relief of leg pain but numbness may persist depending on the length and degree of pressure that has been present.    Follow-Up Instructions: Return in about 4 weeks (around 07/30/2019).   Orders:  No orders of the defined types were placed in this encounter.  No orders of the defined types were placed in this encounter.     Procedures: No procedures performed   Clinical Data: No additional findings.   Subjective: Chief Complaint  Patient presents with  . Lower Back - Follow-up    Saw Dr. Thurston Hole for knees he thinks the lateral knee pain is coming from her back.      69 year old female with a several year history of progressive loss of function. She is having  neurogenic claudication symptoms with difficulty standing and walking even grocery shopping with pain in the back and buttocks and into the legs. She also has osteoarthritis   Review of Systems   Objective: Vital Signs: BP 116/76 (BP Location: Left Arm, Patient Position: Sitting)   Pulse (!) 103   Ht 5\' 10"  (1.778 m)   Wt 240 lb (108.9 kg)   BMI 34.44 kg/m   Physical Exam  Ortho Exam  Specialty Comments:  No specialty comments available.  Imaging: No results found.   PMFS History: Patient Active Problem List   Diagnosis Date Noted  . ARF (acute renal failure) (HCC) 08/20/2014  . Chest pain 05/06/2013  . Nausea vomiting and diarrhea 05/06/2013  . Fever 05/06/2013  . HTN (hypertension) 05/06/2013  . Hypothyroidism 05/06/2013  . Diabetes mellitus (HCC) 05/06/2013   Past Medical History:  Diagnosis Date  . Coronary artery disease   . Depression   . Diabetes mellitus   . Fibromyalgia   . Hypertension   . MI (myocardial infarction) (HCC)   . Osteoarthritis     Family History  Problem Relation Age of Onset  . Hypertension Mother   . Diabetes Mother   . Stroke Mother   . Hypertension Other   . Diabetes Other     Past Surgical History:  Procedure Laterality  Date  . ABDOMINAL HYSTERECTOMY    . BREAST SURGERY    . CARDIAC CATHETERIZATION     1995   Social History   Occupational History  . Not on file  Tobacco Use  . Smoking status: Current Every Day Smoker    Packs/day: 0.15    Types: Cigarettes  . Smokeless tobacco: Never Used  . Tobacco comment: three cigarettes/ day  Substance and Sexual Activity  . Alcohol use: No  . Drug use: No  . Sexual activity: Not Currently

## 2019-07-09 ENCOUNTER — Telehealth: Payer: Self-pay

## 2019-07-09 NOTE — Telephone Encounter (Signed)
Blue sheet started and gave to Dr. Otelia Sergeant

## 2019-07-09 NOTE — Telephone Encounter (Signed)
Patient's son called about scheduling surgery.  Need surgery sheet.  Thanks!

## 2019-07-14 ENCOUNTER — Other Ambulatory Visit: Payer: Self-pay | Admitting: Orthopedic Surgery

## 2019-08-20 ENCOUNTER — Other Ambulatory Visit: Payer: Self-pay | Admitting: Orthopedic Surgery

## 2019-08-20 ENCOUNTER — Other Ambulatory Visit: Payer: Self-pay | Admitting: Physician Assistant

## 2019-08-20 NOTE — Telephone Encounter (Signed)
Please advise, if medication is okay for refill. Thank you.

## 2019-09-24 ENCOUNTER — Other Ambulatory Visit: Payer: Self-pay | Admitting: Orthopedic Surgery

## 2019-09-25 NOTE — Telephone Encounter (Signed)
Pls advise.  

## 2019-11-27 ENCOUNTER — Ambulatory Visit: Payer: Medicare Other | Attending: Internal Medicine

## 2019-11-27 DIAGNOSIS — Z23 Encounter for immunization: Secondary | ICD-10-CM

## 2019-11-27 NOTE — Progress Notes (Signed)
Covid-19 Vaccination Clinic  Name:  Stacey Fletcher    MRN: [id number] DOB: 11/22/1950  11/27/2019  Stacey Fletcher was observed post Covid-19 immunization for 15 minutes without incident. She was provided with Vaccine Information Sheet and instruction to access the V-Safe system.   Stacey Fletcher was instructed to call 911 with any severe reactions post vaccine: Marland Kitchen Difficulty breathing  . Swelling of face and throat  . A fast heartbeat  . A bad rash all over body  . Dizziness and weakness

## 2019-12-04 ENCOUNTER — Other Ambulatory Visit: Payer: Self-pay | Admitting: Family Medicine

## 2019-12-04 DIAGNOSIS — Z1231 Encounter for screening mammogram for malignant neoplasm of breast: Secondary | ICD-10-CM

## 2019-12-10 ENCOUNTER — Ambulatory Visit: Payer: Medicare Other | Admitting: Specialist

## 2020-01-07 ENCOUNTER — Other Ambulatory Visit: Payer: Self-pay

## 2020-01-07 ENCOUNTER — Encounter: Payer: Self-pay | Admitting: Specialist

## 2020-01-07 ENCOUNTER — Ambulatory Visit: Payer: Self-pay

## 2020-01-07 ENCOUNTER — Ambulatory Visit (INDEPENDENT_AMBULATORY_CARE_PROVIDER_SITE_OTHER): Payer: Medicare Other | Admitting: Specialist

## 2020-01-07 VITALS — BP 109/75 | HR 101 | Ht 70.0 in | Wt 239.8 lb

## 2020-01-07 DIAGNOSIS — M48062 Spinal stenosis, lumbar region with neurogenic claudication: Secondary | ICD-10-CM | POA: Diagnosis not present

## 2020-01-07 DIAGNOSIS — M4316 Spondylolisthesis, lumbar region: Secondary | ICD-10-CM

## 2020-01-07 NOTE — Progress Notes (Signed)
Office Visit Note   Patient: Stacey Fletcher           Date of Birth: 07/08/1950           MRN: [id number] Visit Date: 01/07/2020              Requested by: Soundra Pilon, FNP [address] [place],  [place] [address] PCP: Soundra Pilon, FNP   Assessment & Plan: Visit Diagnoses:  1. Spondylolisthesis, lumbar region   2. Spinal stenosis of lumbar region with neurogenic claudication    Plan: Avoid bending, stooping and avoid lifting weights greater than 10 lbs. Avoid prolong standing and walking. Order for a new walker with wheels. Surgery scheduling secretary Tivis Ringer, will call you in the next week to schedule for surgery.  Surgery recommended is a one level lumbar fusion L4-5 this would be done with rods, screws and cages with local bone graft and allograft (donor bone graft). Take hydrocodone for for pain. Risk of surgery includes risk of infection 1 in 200 patients, bleeding 1/2% chance you would need a transfusion.   Risk to the nerves is one in 10,000. You will need to use a brace for 3 months and wean from the brace on the 4th month. Expect improved walking and standing tolerance. Expect relief of leg pain but numbness may persist depending on the length and degree of pressure that has been present. Call us as soon as primary care gives you clearance for surgical intervention, call and leave message with [facility].  Follow-Up Instructions: No follow-ups on file.   Orders:  Orders Placed This Encounter  Procedures  . XR Lumbar Spine 2-3 Views   No orders of the defined types were placed in this encounter.     Procedures: No procedures performed   Clinical Data: No additional findings.   Subjective: Chief Complaint  Patient presents with  . Lower Back - Follow-up    HPI  Review of Systems  Constitutional: Negative.  Negative for activity change, appetite change, chills, diaphoresis, fatigue, fever and unexpected weight change.  HENT: Negative.    Eyes: Positive for visual disturbance. Negative for photophobia, pain, discharge, redness and itching.  Respiratory: Negative.  Negative for apnea, cough, choking, chest tightness, shortness of breath, wheezing and stridor.   Cardiovascular: Negative.  Negative for chest pain, palpitations and leg swelling.  Gastrointestinal: Negative.  Negative for abdominal distention, abdominal pain, anal bleeding, blood in stool, constipation, diarrhea, nausea and rectal pain.  Endocrine: Negative.   Genitourinary: Negative.   Musculoskeletal: Positive for back pain. Negative for arthralgias, gait problem, joint swelling, myalgias and neck pain.  Allergic/Immunologic: Negative.   Neurological: Positive for weakness and numbness.  Hematological: Negative for adenopathy. Does not bruise/bleed easily.  Psychiatric/Behavioral: Negative.      Objective: Vital Signs: BP 109/75 (BP Location: Left Arm, Patient Position: Sitting)   Pulse (!) 101   Ht 5\' 10"  (1.778 m)   Wt 239 lb 12.8 oz (108.8 kg)   BMI 34.41 kg/m   Physical Exam Musculoskeletal:     Lumbar back: Negative right straight leg raise test.     Back Exam   Tenderness  The patient is experiencing tenderness in the lumbar.  Range of Motion  Extension: abnormal  Flexion: normal  Lateral bend right: abnormal  Lateral bend left: abnormal  Rotation right: abnormal  Rotation left: abnormal   Muscle Strength  Right Quadriceps:  5/5  Left Quadriceps:  5/5  Right Hamstrings:  5/5  Left Hamstrings:  5/5   Tests  Straight leg raise right: negative  Reflexes  Patellar: 0/4 Achilles: 0/4      Specialty Comments:  No specialty comments available.  Imaging: No results found.   PMFS History: Patient Active Problem List   Diagnosis Date Noted  . ARF (acute renal failure) (HCC) 08/20/2014  . Chest pain 05/06/2013  . Nausea vomiting and diarrhea 05/06/2013  . Fever 05/06/2013  . HTN (hypertension) 05/06/2013  .  Hypothyroidism 05/06/2013  . Diabetes mellitus (HCC) 05/06/2013   Past Medical History:  Diagnosis Date  . Coronary artery disease   . Depression   . Diabetes mellitus   . Fibromyalgia   . Hypertension   . MI (myocardial infarction) (HCC)   . Osteoarthritis     Family History  Problem Relation Age of Onset  . Hypertension Mother   . Diabetes Mother   . Stroke Mother   . Hypertension Other   . Diabetes Other     Past Surgical History:  Procedure Laterality Date  . ABDOMINAL HYSTERECTOMY    . BREAST SURGERY    . CARDIAC CATHETERIZATION     1995   Social History   Occupational History  . Not on file  Tobacco Use  . Smoking status: Current Every Day Smoker    Packs/day: 0.15    Types: Cigarettes  . Smokeless tobacco: Never Used  . Tobacco comment: three cigarettes/ day  Vaping Use  . Vaping Use: Never used  Substance and Sexual Activity  . Alcohol use: No  . Drug use: No  . Sexual activity: Not Currently

## 2020-01-07 NOTE — Patient Instructions (Addendum)
Plan: Avoid bending, stooping and avoid lifting weights greater than 10 lbs. Avoid prolong standing and walking. Order for a new walker with wheels. Surgery scheduling secretary Tivis Ringer, will call you in the next week to schedule for surgery.  Surgery recommended is a one level lumbar fusion L4-5 this would be done with rods, screws and cages with local bone graft and allograft (donor bone graft). Take hydrocodone for for pain. Risk of surgery includes risk of infection 1 in 200 patients, bleeding 1/2% chance you would need a transfusion.   Risk to the nerves is one in 10,000. You will need to use a brace for 3 months and wean from the brace on the 4th month. Expect improved walking and standing tolerance. Expect relief of leg pain but numbness may persist depending on the length and degree of pressure that has been present. Call us as soon as primary care gives you clearance for surgical intervention, call and leave message with [facility].

## 2020-01-12 ENCOUNTER — Ambulatory Visit: Payer: Medicare Other

## 2020-01-13 ENCOUNTER — Ambulatory Visit
Admission: RE | Admit: 2020-01-13 | Discharge: 2020-01-13 | Disposition: A | Discharge status: Home / Self Care | Payer: Medicare Other | Source: Ambulatory Visit | Attending: Family Medicine | Admitting: Family Medicine

## 2020-01-13 DIAGNOSIS — Z1231 Encounter for screening mammogram for malignant neoplasm of breast: Secondary | ICD-10-CM

## 2020-02-09 DIAGNOSIS — Z7984 Long term (current) use of oral hypoglycemic drugs: Secondary | ICD-10-CM | POA: Diagnosis not present

## 2020-02-09 DIAGNOSIS — E1169 Type 2 diabetes mellitus with other specified complication: Secondary | ICD-10-CM | POA: Diagnosis not present

## 2020-02-09 DIAGNOSIS — I10 Essential (primary) hypertension: Secondary | ICD-10-CM | POA: Diagnosis not present

## 2020-02-09 DIAGNOSIS — Z01818 Encounter for other preprocedural examination: Secondary | ICD-10-CM | POA: Diagnosis not present

## 2020-02-11 ENCOUNTER — Ambulatory Visit: Payer: Medicare Other | Admitting: Specialist

## 2020-02-19 DIAGNOSIS — Z1152 Encounter for screening for COVID-19: Secondary | ICD-10-CM | POA: Diagnosis not present

## 2020-02-23 DIAGNOSIS — E119 Type 2 diabetes mellitus without complications: Secondary | ICD-10-CM | POA: Diagnosis not present

## 2020-02-23 DIAGNOSIS — M3501 Sicca syndrome with keratoconjunctivitis: Secondary | ICD-10-CM | POA: Diagnosis not present

## 2020-02-23 DIAGNOSIS — H43393 Other vitreous opacities, bilateral: Secondary | ICD-10-CM | POA: Diagnosis not present

## 2020-02-23 DIAGNOSIS — H25813 Combined forms of age-related cataract, bilateral: Secondary | ICD-10-CM | POA: Diagnosis not present

## 2020-02-25 DIAGNOSIS — F17200 Nicotine dependence, unspecified, uncomplicated: Secondary | ICD-10-CM | POA: Diagnosis not present

## 2020-02-29 ENCOUNTER — Other Ambulatory Visit: Payer: Self-pay

## 2020-02-29 ENCOUNTER — Telehealth: Payer: Self-pay

## 2020-02-29 ENCOUNTER — Ambulatory Visit (INDEPENDENT_AMBULATORY_CARE_PROVIDER_SITE_OTHER): Payer: Medicare Other | Admitting: Specialist

## 2020-02-29 ENCOUNTER — Encounter: Payer: Self-pay | Admitting: Specialist

## 2020-02-29 VITALS — BP 105/69 | HR 94 | Ht 70.0 in | Wt 240.0 lb

## 2020-02-29 DIAGNOSIS — M5416 Radiculopathy, lumbar region: Secondary | ICD-10-CM | POA: Diagnosis not present

## 2020-02-29 DIAGNOSIS — M4316 Spondylolisthesis, lumbar region: Secondary | ICD-10-CM | POA: Diagnosis not present

## 2020-02-29 NOTE — Progress Notes (Signed)
Office Visit Note   Patient: Stacey Fletcher           Date of Birth: Dec 23, 1950           MRN: [id number] Visit Date: 02/29/2020              Requested by: Soundra Pilon, FNP [address] [place],  [place] [address] PCP: Soundra Pilon, FNP   Assessment & Plan: Visit Diagnoses:  1. Spondylolisthesis, lumbar region   2. Lumbar radiculopathy   3. Spondylolisthesis of lumbar region     Plan: Our surgery scheduler will contact patient in regards to scheduling surgery.  We will also get clearance from her primary care office.  Since most recent lumbar MRI was December 2020 Dr. Otelia Sergeant would like to get an updated study so this was ordered.  Patient will follow up with him after completion to discuss results.  Follow-Up Instructions: Return for Needs return office visit 2 weeks postop.   Orders:  Orders Placed This Encounter  Procedures  . MR Lumbar Spine w/o contrast   No orders of the defined types were placed in this encounter.     Procedures: No procedures performed   Clinical Data: No additional findings.   Subjective: Chief Complaint  Patient presents with  . Lower Back - Pain    HPI 70 year-old black female with known history of chronic low back pain and right lower extremity radiculopathy with L4-5 severe spinal stenosis and spondylolisthesis returns.  Patient states that she was recently seen by her primary care nurse practitioner who has cleared her for surgery.  Most recent hemoglobin A1c February 09, 2020 was 7.3.  Patient states she continues have ongoing low back pain and right lower extremity radiculopathy.  His pain bothers her at all times during the day.  She is wanting to proceed with L4-5 fusion as previously discussed with Dr. Otelia Sergeant last month.  Most recent lumbar MRI scan was performed January 11, 2019 and that showed:  CLINICAL DATA:  Low back pain with right buttock and hip pain radiating down the leg.  EXAM: MRI LUMBAR SPINE WITHOUT  CONTRAST  TECHNIQUE: Multiplanar, multisequence MR imaging of the lumbar spine was performed. No intravenous contrast was administered.  COMPARISON:  Radiography 12/15/2018  FINDINGS: Segmentation: 5 lumbar type vertebral bodies with a somewhat transitional S1.  Alignment:  3 mm degenerative anterolisthesis L4-5.  Vertebrae:  No fracture or primary bone lesion.  Conus medullaris and cauda equina: Conus extends to the L1 level. Conus and cauda equina appear normal.  Paraspinal and other soft tissues: Negative  Disc levels:  No significant finding at L2-3 or above.  L3-4: Mild bulging of the disc. Mild facet hypertrophy. No compressive stenosis.  L4-5: Bilateral facet arthropathy with edematous change. 3 mm of degenerative anterolisthesis because of this. Circumferential protrusion of the disc. Severe spinal stenosis which could cause neural compression on either or both sides. Bilateral foraminal stenosis as well.  L5-S1: Bilateral facet arthropathy but without slippage. Shallow disc herniation more prominent towards the left. Spinal stenosis at this level which has potential for neural compression in the lateral recesses and foramina, though not as severe is at the L4-5 level.  IMPRESSION: L4-5: Severe multifactorial spinal stenosis. Advanced bilateral facet arthropathy with edematous change. 3 mm of anterolisthesis. Shallow protrusion of the disc. Bilateral neural foraminal stenosis.  L5-S1: Stenosis of the lateral recesses and neural foramina that could cause neural compression. Bilateral facet arthropathy but without slippage. Shallow disc  herniation more prominent towards the left.  L3-4: Disc bulge and mild facet hypertrophy but without compressive stenosis.   Electronically Signed   By: Paulina Fusi M.D.   On: 01/11/2019 13:21    Objective: Vital Signs: BP 105/69 (BP Location: Left Arm, Patient Position: Sitting)   Pulse 94   Ht 5'  10" (1.778 m)   Wt 240 lb (108.9 kg)   BMI 34.44 kg/m   Physical Exam Pleasant female alert and oriented in no acute distress.  Ortho Exam  Specialty Comments:  No specialty comments available.  Imaging: No results found.   PMFS History: Patient Active Problem List   Diagnosis Date Noted  . ARF (acute renal failure) (HCC) 08/20/2014  . Chest pain 05/06/2013  . Nausea vomiting and diarrhea 05/06/2013  . Fever 05/06/2013  . HTN (hypertension) 05/06/2013  . Hypothyroidism 05/06/2013  . Diabetes mellitus (HCC) 05/06/2013   Past Medical History:  Diagnosis Date  . Coronary artery disease   . Depression   . Diabetes mellitus   . Fibromyalgia   . Hypertension   . MI (myocardial infarction) (HCC)   . Osteoarthritis     Family History  Problem Relation Age of Onset  . Hypertension Mother   . Diabetes Mother   . Stroke Mother   . Hypertension Other   . Diabetes Other     Past Surgical History:  Procedure Laterality Date  . ABDOMINAL HYSTERECTOMY    . BREAST SURGERY    . CARDIAC CATHETERIZATION     1995   Social History   Occupational History  . Not on file  Tobacco Use  . Smoking status: Current Every Day Smoker    Packs/day: 0.15    Types: Cigarettes  . Smokeless tobacco: Never Used  . Tobacco comment: three cigarettes/ day  Vaping Use  . Vaping Use: Never used  Substance and Sexual Activity  . Alcohol use: No  . Drug use: No  . Sexual activity: Not Currently

## 2020-02-29 NOTE — Telephone Encounter (Signed)
I called patient and advised that another MRI is needed before scheduling surgery since last MRI was 01-2019.  Stacey Fletcher ordered MRI and she will be contacted for scheduling.  I advised her that she needs a follow up appointment with Dr. Otelia Sergeant after the MRI.

## 2020-03-03 ENCOUNTER — Telehealth: Payer: Self-pay

## 2020-03-03 NOTE — Telephone Encounter (Signed)
Pt called to let christy know that her Mri appt is on feb the 14th I offered to make the pt a MRI follow up. She stated no that she was just told to call and let Neysa Bonito know when her MRI was and wait for a call from her.

## 2020-03-03 NOTE — Telephone Encounter (Signed)
I called and scheduled appt to review MRI for 03/28/20 @ 215pm

## 2020-03-21 ENCOUNTER — Ambulatory Visit
Admission: RE | Admit: 2020-03-21 | Discharge: 2020-03-21 | Disposition: A | Discharge status: Home / Self Care | Payer: Medicare Other | Source: Ambulatory Visit | Attending: Surgery | Admitting: Surgery

## 2020-03-21 DIAGNOSIS — M545 Low back pain, unspecified: Secondary | ICD-10-CM | POA: Diagnosis not present

## 2020-03-21 DIAGNOSIS — M48061 Spinal stenosis, lumbar region without neurogenic claudication: Secondary | ICD-10-CM | POA: Diagnosis not present

## 2020-03-21 DIAGNOSIS — M4316 Spondylolisthesis, lumbar region: Secondary | ICD-10-CM

## 2020-03-28 ENCOUNTER — Other Ambulatory Visit: Payer: Self-pay

## 2020-03-28 ENCOUNTER — Ambulatory Visit (INDEPENDENT_AMBULATORY_CARE_PROVIDER_SITE_OTHER): Payer: Medicare Other | Admitting: Specialist

## 2020-03-28 ENCOUNTER — Encounter: Payer: Self-pay | Admitting: Specialist

## 2020-03-28 VITALS — BP 104/71 | HR 102 | Ht 70.0 in | Wt 240.0 lb

## 2020-03-28 DIAGNOSIS — M4316 Spondylolisthesis, lumbar region: Secondary | ICD-10-CM

## 2020-03-28 DIAGNOSIS — M1712 Unilateral primary osteoarthritis, left knee: Secondary | ICD-10-CM

## 2020-03-28 DIAGNOSIS — M48062 Spinal stenosis, lumbar region with neurogenic claudication: Secondary | ICD-10-CM

## 2020-03-28 DIAGNOSIS — M1711 Unilateral primary osteoarthritis, right knee: Secondary | ICD-10-CM

## 2020-03-28 MED ORDER — GABAPENTIN 100 MG PO CAPS: 100.0000 mg | ORAL_CAPSULE | Freq: Every day | ORAL | 3 refills | Status: DC
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
## 2020-03-28 NOTE — Patient Instructions (Signed)
Plan: Avoid bending, stooping and avoid lifting weights greater than 10 lbs. Avoid prolong standing and walking. Order for a new walker with wheels. Surgery scheduling secretary Tivis Ringer, will call you in the next week to schedule for surgery.  Surgery recommended is a two level lumbar decompression L4-5 and L5-S1 with a single level fusion L4-5 that is shifting due to spondylolisthesis this would be done with rods, screws and cages with local bone graft and allograft (donor bone graft).  Risk of surgery includes risk of infection 1 in 200 patients, bleeding 1/2% chance you would need a transfusion.   Risk to the nerves is one in 10,000. You will need to use a brace for 3 months and wean from the brace on the 4th month. Expect improved walking and standing tolerance. Expect relief of leg pain but numbness may persist depending on the length and degree of pressure that has been present

## 2020-04-21 ENCOUNTER — Other Ambulatory Visit: Payer: Self-pay

## 2020-04-28 ENCOUNTER — Ambulatory Visit: Payer: Medicare Other | Admitting: Surgery

## 2020-04-29 NOTE — Pre-Procedure Instructions (Addendum)
Surgical Instructions:    Your procedure is scheduled on Tuesday 05/03/20 (07:30 AM- 12:08 PM).  Report to [facility] Main Entrance "A" at 05:30 A.M., then check in with the Admitting office.  Call this number if you have problems the morning of surgery:  [phone]   If you have any questions prior to your surgery date call [phone]: Open Monday-Friday 8am-4pm.    Remember:  Do not eat after midnight the night before your surgery.   You may drink clear liquids until 04:30 AM the morning of your surgery.    Clear liquids allowed are:  Water, Juice (non-citric and without pulp - diabetics please choose diet or no sugar  options), Carbonated beverages - (diabetics please choose diet or no sugar options), Clear Tea, Black Coffee  only (no creamer, milk or cream including half and half) and Gatorade (diabetics please choose diet or no sugar  Options).  >>Please complete your PRE-SURGERY 10 oz water that was provided to you by 04:30 AM the morning of surgery.  Please, if able, drink it in one setting. DO NOT SIP.<<    Take these medicines the morning of surgery with A SIP OF WATER: amLODipine (NORVASC) buPROPion (WELLBUTRIN XL) levothyroxine (SYNTHROID) pravastatin (PRAVACHOL)  venlafaxine XR (EFFEXOR-XR)   IF NEEDED: methocarbamol (ROBAXIN)  tiZANidine (ZANAFLEX) RESTASIS eye drops   THE NIGHT BEFORE SURGERY:  You  may take metFORMIN (GLUCOPHAGE) as you normally would.   Do not take glipiZIDE (GLUCOTROL XL).      THE MORNING OF SURGERY: . Do not take glipiZIDE (GLUCOTROL XL), metFORMIN (GLUCOPHAGE), or OZEMPIC.   As of today, STOP taking any Aspirin (unless otherwise instructed by your surgeon), NSAIDs such as etodolac (LODINE), Aleve, Naproxen, Ibuprofen, Motrin, Advil, Goody's, BC's, all herbal medications, fish oil, and all vitamins.            HOW TO MANAGE YOUR DIABETES BEFORE AND AFTER SURGERY  Why is it important to control my blood sugar before and after  surgery? . Improving blood sugar levels before and after surgery helps healing and can limit problems. . A way of improving blood sugar control is eating a healthy diet by: o  Eating less sugar and carbohydrates o  Increasing activity/exercise o  Talking with your doctor about reaching your blood sugar goals . High blood sugars (greater than 180 mg/dL) can raise your risk of infections and slow your recovery, so you will need to focus on controlling your diabetes during the weeks before surgery. . Make sure that the doctor who takes care of your diabetes knows about your planned surgery including the date and location.  How do I manage my blood sugar before surgery? . Check your blood sugar at least 4 times a day, starting 2 days before surgery, to make sure that the level is not too high or low. . Check your blood sugar the morning of your surgery when you wake up and every 2 hours until you get to the Short Stay unit. o If your blood sugar is less than 70 mg/dL, you will need to treat for low blood sugar: - Do not take insulin. - Treat a low blood sugar (less than 70 mg/dL) with  cup of clear juice (cranberry or apple), 4 glucose tablets, OR glucose gel. - Recheck blood sugar in 15 minutes after treatment (to make sure it is greater than 70 mg/dL). If your blood sugar is not greater than 70 mg/dL on recheck, call [phone] for further instructions. . Report  your blood sugar to the short stay nurse when you get to Short Stay.  . If you are admitted to the hospital after surgery: o Your blood sugar will be checked by the staff and you will probably be given insulin after surgery (instead of oral diabetes medicines) to make sure you have good blood sugar levels. o The goal for blood sugar control after surgery is 80-180 mg/dL.   The Morning of Surgery:            Do not wear jewelry, make up, or nail polish.            Do not wear lotions, powders, perfumes, or deodorant.            Do  not shave 48 hours prior to surgery.              Do not bring valuables to the hospital.            [facility] is not responsible for any belongings or valuables.  Do NOT Smoke (Tobacco/Vaping) or drink Alcohol 24 hours prior to your procedure.  If you use a CPAP at night, you may bring all equipment for your overnight stay.   Contacts, glasses, dentures or bridgework may not be worn into surgery, please bring cases for these belongings   For patients admitted to the hospital, discharge time will be determined by your treatment team.   Patients discharged the day of surgery will not be allowed to drive home, and someone needs to stay with them for 24 hours.    Special instructions:   [place]- Preparing For Surgery  Before surgery, you can play an important role. Because skin is not sterile, your skin needs to be as free of germs as possible. You can reduce the number of germs on your skin by washing with CHG (chlorahexidine gluconate) Soap before surgery.  CHG is an antiseptic cleaner which kills germs and bonds with the skin to continue killing germs even after washing.    Oral Hygiene is also important to reduce your risk of infection.  Remember - BRUSH YOUR TEETH THE MORNING OF SURGERY WITH YOUR REGULAR TOOTHPASTE  Please do not use if you have an allergy to CHG or antibacterial soaps. If your skin becomes reddened/irritated stop using the CHG.  Do not shave (including legs and underarms) for at least 48 hours prior to first CHG shower. It is OK to shave your face.  Please follow these instructions carefully.   1. Shower the NIGHT BEFORE SURGERY and the MORNING OF SURGERY.  2. If you chose to wash your hair, wash your hair first as usual with your normal shampoo.  3. After you shampoo, rinse your hair and body thoroughly to remove the shampoo.  4. Wash Face and genitals (private parts) with your normal soap.   5.  Shower the NIGHT BEFORE SURGERY and the MORNING OF SURGERY  with CHG Soap.   6. Use CHG Soap as you would any other liquid soap. You can apply CHG directly to the skin and wash gently with a scrungie or a clean washcloth.   7. Apply the CHG Soap to your body ONLY FROM THE NECK DOWN.  Do not use on open wounds or open sores. Avoid contact with your eyes, ears, mouth and genitals (private parts). Wash Face and genitals (private parts)  with your normal soap.   8. Wash thoroughly, paying special attention to the area where your surgery will be performed.  9. Thoroughly rinse your body with warm water from the neck down.  10. DO NOT shower/wash with your normal soap after using and rinsing off the CHG Soap.  11. Pat yourself dry with a CLEAN TOWEL.  12. Wear CLEAN PAJAMAS to bed the night before surgery.  13. Place CLEAN SHEETS on your bed the night before your surgery.  14. DO NOT SLEEP WITH PETS.   Day of Surgery: SHOWER WITH CHG SOAP. Wear Clean/Comfortable clothing the morning of surgery. Do not apply any deodorants/lotions.   Remember to brush your teeth WITH YOUR REGULAR TOOTHPASTE.   Please read over the following fact sheets that you were given.

## 2020-05-02 ENCOUNTER — Encounter (HOSPITAL_COMMUNITY)
Admission: RE | Admit: 2020-05-02 | Discharge: 2020-05-02 | Disposition: A | Discharge status: Home / Self Care | Payer: Medicare Other | Source: Ambulatory Visit | Attending: Specialist | Admitting: Specialist

## 2020-05-02 ENCOUNTER — Encounter (HOSPITAL_COMMUNITY): Payer: Self-pay

## 2020-05-02 ENCOUNTER — Encounter: Payer: Self-pay | Admitting: Surgery

## 2020-05-02 ENCOUNTER — Encounter (HOSPITAL_COMMUNITY): Payer: Self-pay | Admitting: Vascular Surgery

## 2020-05-02 ENCOUNTER — Ambulatory Visit (INDEPENDENT_AMBULATORY_CARE_PROVIDER_SITE_OTHER): Payer: Medicare Other | Admitting: Surgery

## 2020-05-02 ENCOUNTER — Other Ambulatory Visit: Payer: Self-pay

## 2020-05-02 VITALS — BP 112/71 | HR 114 | Ht 68.5 in | Wt 239.0 lb

## 2020-05-02 DIAGNOSIS — M797 Fibromyalgia: Secondary | ICD-10-CM | POA: Diagnosis not present

## 2020-05-02 DIAGNOSIS — M4316 Spondylolisthesis, lumbar region: Secondary | ICD-10-CM

## 2020-05-02 DIAGNOSIS — Z20822 Contact with and (suspected) exposure to covid-19: Secondary | ICD-10-CM | POA: Diagnosis not present

## 2020-05-02 DIAGNOSIS — I10 Essential (primary) hypertension: Secondary | ICD-10-CM | POA: Diagnosis not present

## 2020-05-02 DIAGNOSIS — E118 Type 2 diabetes mellitus with unspecified complications: Secondary | ICD-10-CM | POA: Insufficient documentation

## 2020-05-02 DIAGNOSIS — Z7901 Long term (current) use of anticoagulants: Secondary | ICD-10-CM | POA: Insufficient documentation

## 2020-05-02 DIAGNOSIS — M5416 Radiculopathy, lumbar region: Secondary | ICD-10-CM

## 2020-05-02 DIAGNOSIS — I2510 Atherosclerotic heart disease of native coronary artery without angina pectoris: Secondary | ICD-10-CM | POA: Diagnosis not present

## 2020-05-02 DIAGNOSIS — Z01818 Encounter for other preprocedural examination: Secondary | ICD-10-CM | POA: Diagnosis not present

## 2020-05-02 DIAGNOSIS — Z7984 Long term (current) use of oral hypoglycemic drugs: Secondary | ICD-10-CM | POA: Diagnosis not present

## 2020-05-02 DIAGNOSIS — M48062 Spinal stenosis, lumbar region with neurogenic claudication: Secondary | ICD-10-CM | POA: Insufficient documentation

## 2020-05-02 DIAGNOSIS — Z7982 Long term (current) use of aspirin: Secondary | ICD-10-CM | POA: Diagnosis not present

## 2020-05-02 DIAGNOSIS — I252 Old myocardial infarction: Secondary | ICD-10-CM | POA: Insufficient documentation

## 2020-05-02 DIAGNOSIS — Z79899 Other long term (current) drug therapy: Secondary | ICD-10-CM | POA: Insufficient documentation

## 2020-05-02 DIAGNOSIS — Z87891 Personal history of nicotine dependence: Secondary | ICD-10-CM | POA: Insufficient documentation

## 2020-05-02 DIAGNOSIS — E049 Nontoxic goiter, unspecified: Secondary | ICD-10-CM | POA: Insufficient documentation

## 2020-05-02 HISTORY — DX: Pneumonia, unspecified organism: J18.9

## 2020-05-02 HISTORY — DX: Nontoxic goiter, unspecified: E04.9

## 2020-05-02 LAB — URINALYSIS, ROUTINE W REFLEX MICROSCOPIC
Glucose, UA: NEGATIVE mg/dL
Hgb urine dipstick: NEGATIVE
Ketones, ur: NEGATIVE mg/dL
Leukocytes,Ua: NEGATIVE
Nitrite: NEGATIVE
Protein, ur: NEGATIVE mg/dL
Specific Gravity, Urine: 1.024 (ref 1.005–1.030)
pH: 5 (ref 5.0–8.0)

## 2020-05-02 LAB — CBC
HCT: 38.7 % (ref 36.0–46.0)
Hemoglobin: 12 g/dL (ref 12.0–15.0)
MCH: 26.1 pg (ref 26.0–34.0)
MCHC: 31 g/dL (ref 30.0–36.0)
MCV: 84.3 fL (ref 80.0–100.0)
Platelets: 306 10*3/uL (ref 150–400)
RBC: 4.59 MIL/uL (ref 3.87–5.11)
RDW: 15.5 % (ref 11.5–15.5)
WBC: 7.5 10*3/uL (ref 4.0–10.5)
nRBC: 0 % (ref 0.0–0.2)

## 2020-05-02 LAB — COMPREHENSIVE METABOLIC PANEL
ALT: 17 U/L (ref 0–44)
AST: 21 U/L (ref 15–41)
Albumin: 3.7 g/dL (ref 3.5–5.0)
Alkaline Phosphatase: 73 U/L (ref 38–126)
Anion gap: 7 (ref 5–15)
BUN: 16 mg/dL (ref 8–23)
CO2: 29 mmol/L (ref 22–32)
Calcium: 9.7 mg/dL (ref 8.9–10.3)
Chloride: 102 mmol/L (ref 98–111)
Creatinine, Ser: 1.08 mg/dL — ABNORMAL HIGH (ref 0.44–1.00)
GFR, Estimated: 55 mL/min — ABNORMAL LOW (ref >60)
Glucose, Bld: 117 mg/dL — ABNORMAL HIGH (ref 70–99)
Potassium: 3.4 mmol/L — ABNORMAL LOW (ref 3.5–5.1)
Sodium: 138 mmol/L (ref 135–145)
Total Bilirubin: 0.5 mg/dL (ref 0.3–1.2)
Total Protein: 6.4 g/dL — ABNORMAL LOW (ref 6.5–8.1)

## 2020-05-02 LAB — PROTIME-INR
INR: 1 (ref 0.8–1.2)
Prothrombin Time: 13 seconds (ref 11.4–15.2)

## 2020-05-02 LAB — SURGICAL PCR SCREEN
MRSA, PCR: NEGATIVE
Staphylococcus aureus: POSITIVE — AB

## 2020-05-02 LAB — TYPE AND SCREEN
ABO/RH(D): O POS
Antibody Screen: NEGATIVE

## 2020-05-02 LAB — GLUCOSE, CAPILLARY: Glucose-Capillary: 128 mg/dL — ABNORMAL HIGH (ref 70–99)

## 2020-05-02 LAB — HEMOGLOBIN A1C
Hgb A1c MFr Bld: 6.8 % — ABNORMAL HIGH (ref 4.8–5.6)
Mean Plasma Glucose: 148.46 mg/dL

## 2020-05-02 LAB — SARS CORONAVIRUS 2 (TAT 6-24 HRS): SARS Coronavirus 2: NEGATIVE

## 2020-05-02 NOTE — Progress Notes (Signed)
PCP - Peri Maris, FNP Cardiologist - denies  Chest x-ray - n/a EKG - 05/02/20 Stress Test - 30+ years ago ECHO - denies Cardiac Cath - 30+ years ago  Sleep Study - denies  CPAP - denies  Fasting Blood Sugar - 99-107 Normally checks Blood Sugar 1 time a day but has not checked it in about a week. Will check A1C today. CBG at PAT 128.   Blood Thinner Instructions: n/a Aspirin Instructions: LD 04/30/20; pt has not received any instructions. Advised not to take anymore prior to surgery. Pt has an appt with Dr. Otelia Sergeant today and will ask for clarification.   ERAS Protcol -Pt to stop clears by 0430 DOS. PRE-SURGERY Ensure or G2- Pt given 100z bottle of water in PAT to complete by 0430 DOS.  COVID TEST- 05/02/20; pt aware to quarantine after testing with the exception of medical appointments.   Anesthesia review: Yes, prior history of CAD.  Patient denies shortness of breath, fever, cough and chest pain at PAT appointment   All instructions explained to the patient, with a verbal understanding of the material. Patient agrees to go over the instructions while at home for a better understanding. Patient also instructed to self quarantine after being tested for COVID-19. The opportunity to ask questions was provided.
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
## 2020-05-03 ENCOUNTER — Inpatient Hospital Stay (HOSPITAL_COMMUNITY): Admission: RE | Admit: 2020-05-03 | Payer: Medicare Other | Source: Home / Self Care | Admitting: Specialist

## 2020-05-03 ENCOUNTER — Encounter (HOSPITAL_COMMUNITY): Admission: RE | Payer: Self-pay | Source: Home / Self Care

## 2020-05-03 SURGERY — POSTERIOR LUMBAR FUSION 1 LEVEL
Anesthesia: General

## 2020-05-05 ENCOUNTER — Ambulatory Visit: Payer: Medicare Other | Admitting: Surgery

## 2020-05-11 ENCOUNTER — Other Ambulatory Visit: Payer: Self-pay

## 2020-05-11 DIAGNOSIS — J189 Pneumonia, unspecified organism: Secondary | ICD-10-CM | POA: Insufficient documentation

## 2020-05-11 DIAGNOSIS — F32A Depression, unspecified: Secondary | ICD-10-CM | POA: Insufficient documentation

## 2020-05-11 DIAGNOSIS — I219 Acute myocardial infarction, unspecified: Secondary | ICD-10-CM | POA: Insufficient documentation

## 2020-05-11 DIAGNOSIS — I10 Essential (primary) hypertension: Secondary | ICD-10-CM | POA: Insufficient documentation

## 2020-05-11 DIAGNOSIS — M797 Fibromyalgia: Secondary | ICD-10-CM | POA: Insufficient documentation

## 2020-05-11 DIAGNOSIS — M1990 Unspecified osteoarthritis, unspecified site: Secondary | ICD-10-CM | POA: Insufficient documentation

## 2020-05-11 DIAGNOSIS — E049 Nontoxic goiter, unspecified: Secondary | ICD-10-CM | POA: Insufficient documentation

## 2020-05-11 DIAGNOSIS — I2510 Atherosclerotic heart disease of native coronary artery without angina pectoris: Secondary | ICD-10-CM | POA: Insufficient documentation

## 2020-05-17 NOTE — Progress Notes (Deleted)
Cardiology Office Note:    Date:  05/17/2020   ID:  Stacey Fletcher, DOB 04/27/1950, MRN [id number]  PCP:  Soundra Pilon, FNP  Cardiologist:  Norman Herrlich, MD   Referring MD: Soundra Pilon, FNP  ASSESSMENT:    No diagnosis found. PLAN:    In order of problems listed above:  1. ***  Next appointment   Medication Adjustments/Labs and Tests Ordered: Current medicines are reviewed at length with the patient today.  Concerns regarding medicines are outlined above.  No orders of the defined types were placed in this encounter.  No orders of the defined types were placed in this encounter.    No chief complaint on file. ***  History of Present Illness:    Stacey Fletcher is a 70 y.o. female who is being seen today for preoperative cardiology evaluation at the request of Soundra Pilon, FNP.  She was seen by cardiology July 2016 found to have noncardiac chest pain EKG and troponin were normal and was felt to be appropriate for hospital discharge and outpatient myocardial perfusion imaging pharmacologic because of inability to walk.  There is no record of the test being performed.  Chart review shows that she has a background history of heart attack symptoms several decades ago when she lived in [place] and was found to have CAD underwent angioplasty.  She has no documented cardiology records in epic or Care Everywhere.  She is planned for lumbosacral disc surgery under general anesthesia.  Her EKG performed 05/02/2020 showed sinus tachycardia independently reviewed otherwise normal Past Medical History:  Diagnosis Date  . Coronary artery disease   . Depression   . Diabetes mellitus   . Fibromyalgia   . Goiter   . Hypertension   . MI (myocardial infarction) (HCC)   . Osteoarthritis   . Pneumonia    10+ years ago.    Past Surgical History:  Procedure Laterality Date  . ABDOMINAL HYSTERECTOMY    . BREAST SURGERY    . CARDIAC CATHETERIZATION     1995     Current Medications: No outpatient medications have been marked as taking for the 05/19/20 encounter (Appointment) with Baldo Daub, MD.     Allergies:   Prednisone, Ace inhibitors, Empagliflozin, Sitagliptin, and Procardia [nifedipine]   Social History   Socioeconomic History  . Marital status: Divorced    Spouse name: Not on file  . Number of children: Not on file  . Years of education: Not on file  . Highest education level: Not on file  Occupational History  . Not on file  Tobacco Use  . Smoking status: Former Smoker    Quit date: 07/07/2018    Years since quitting: 1.8  . Smokeless tobacco: Never Used  Vaping Use  . Vaping Use: Never used  Substance and Sexual Activity  . Alcohol use: No  . Drug use: No  . Sexual activity: Not Currently  Other Topics Concern  . Not on file  Social History Narrative  . Not on file   Social Determinants of Health   Financial Resource Strain: Not on file  Food Insecurity: Not on file  Transportation Needs: Not on file  Physical Activity: Not on file  Stress: Not on file  Social Connections: Not on file     Family History: The patient's ***family history includes Diabetes in her mother and another family member; Hypertension in her mother and another family member; Stroke in her mother.  ROS:   ROS Please  see the history of present illness.    *** All other systems reviewed and are negative.  EKGs/Labs/Other Studies Reviewed:    The following studies were reviewed today: ***  EKG:  EKG is *** ordered today.  The ekg ordered today is personally reviewed and demonstrates ***  Recent Labs: 05/02/2020: ALT 17; BUN 16; Creatinine, Ser 1.08; Hemoglobin 12.0; Platelets 306; Potassium 3.4; Sodium 138  Recent Lipid Panel    Component Value Date/Time   CHOL 122 03/21/2009 2104   TRIG 77 03/21/2009 2104   HDL 44 03/21/2009 2104   CHOLHDL 2.8 Ratio 03/21/2009 2104   VLDL 15 03/21/2009 2104   LDLCALC 63 03/21/2009 2104     Physical Exam:    VS:  There were no vitals taken for this visit.    Wt Readings from Last 3 Encounters:  05/02/20 239 lb 8 oz (108.6 kg)  05/02/20 239 lb (108.4 kg)  03/28/20 240 lb (108.9 kg)     GEN: *** Well nourished, well developed in no acute distress HEENT: Normal NECK: No JVD; No carotid bruits LYMPHATICS: No lymphadenopathy CARDIAC: ***RRR, no murmurs, rubs, gallops RESPIRATORY:  Clear to auscultation without rales, wheezing or rhonchi  ABDOMEN: Soft, non-tender, non-distended MUSCULOSKELETAL:  No edema; No deformity  SKIN: Warm and dry NEUROLOGIC:  Alert and oriented x 3 PSYCHIATRIC:  Normal affect     Signed, Norman Herrlich, MD  05/17/2020 5:01 PM    [facility]

## 2020-05-19 ENCOUNTER — Ambulatory Visit: Payer: Medicare Other | Admitting: Cardiology

## 2020-05-23 DIAGNOSIS — F17200 Nicotine dependence, unspecified, uncomplicated: Secondary | ICD-10-CM | POA: Diagnosis not present

## 2020-06-02 ENCOUNTER — Ambulatory Visit: Payer: Medicare Other | Admitting: Cardiology
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
## 2020-06-28 ENCOUNTER — Encounter: Payer: Self-pay | Admitting: Interventional Cardiology

## 2020-06-28 ENCOUNTER — Other Ambulatory Visit: Payer: Self-pay

## 2020-06-28 ENCOUNTER — Ambulatory Visit (INDEPENDENT_AMBULATORY_CARE_PROVIDER_SITE_OTHER): Payer: Medicare Other | Admitting: Interventional Cardiology

## 2020-06-28 VITALS — BP 118/66 | HR 89 | Ht 68.0 in | Wt 238.0 lb

## 2020-06-28 DIAGNOSIS — E119 Type 2 diabetes mellitus without complications: Secondary | ICD-10-CM

## 2020-06-28 DIAGNOSIS — Z01810 Encounter for preprocedural cardiovascular examination: Secondary | ICD-10-CM | POA: Diagnosis not present

## 2020-06-28 DIAGNOSIS — I10 Essential (primary) hypertension: Secondary | ICD-10-CM | POA: Diagnosis not present

## 2020-06-28 NOTE — Patient Instructions (Signed)

## 2020-08-17 ENCOUNTER — Other Ambulatory Visit: Payer: Self-pay | Admitting: Specialist

## 2020-11-02 DIAGNOSIS — I10 Essential (primary) hypertension: Secondary | ICD-10-CM | POA: Diagnosis not present

## 2020-11-02 DIAGNOSIS — E039 Hypothyroidism, unspecified: Secondary | ICD-10-CM | POA: Diagnosis not present

## 2020-11-02 DIAGNOSIS — E1169 Type 2 diabetes mellitus with other specified complication: Secondary | ICD-10-CM | POA: Diagnosis not present

## 2020-11-02 DIAGNOSIS — E782 Mixed hyperlipidemia: Secondary | ICD-10-CM | POA: Diagnosis not present

## 2020-11-02 DIAGNOSIS — J410 Simple chronic bronchitis: Secondary | ICD-10-CM | POA: Diagnosis not present

## 2020-12-31 DIAGNOSIS — Z23 Encounter for immunization: Secondary | ICD-10-CM | POA: Diagnosis not present

## 2021-04-22 ENCOUNTER — Other Ambulatory Visit: Payer: Self-pay | Admitting: Specialist

## 2021-06-05 IMAGING — MR MR LUMBAR SPINE W/O CM
4 of 5 series · 24 of 48 positions shown · non-contrast
Comparison: 01/10/2019

CLINICAL DATA: Low back pain and right lower extremity
radiculopathy

EXAM:
MRI LUMBAR SPINE WITHOUT CONTRAST
TECHNIQUE: Multiplanar, multisequence MR imaging of the lumbar spine was
performed. No intravenous contrast was administered.

[Series 5: T2 · sagittal · 4.0mm · 0.73mm/px · 6 of 18 slices shown (1 of 2)]
[im 1/18]
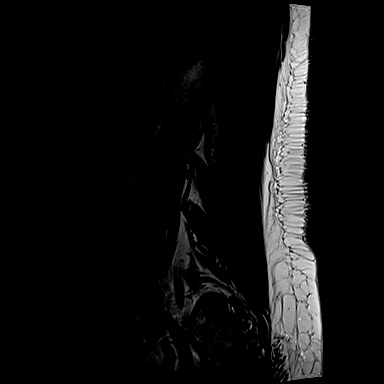
[im 4/18]
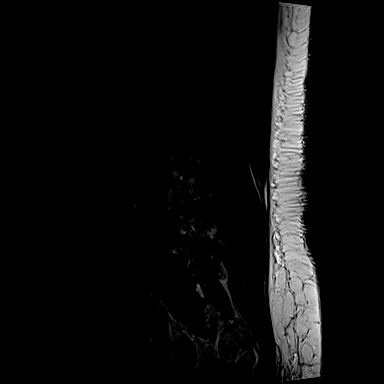
[im 7/18]
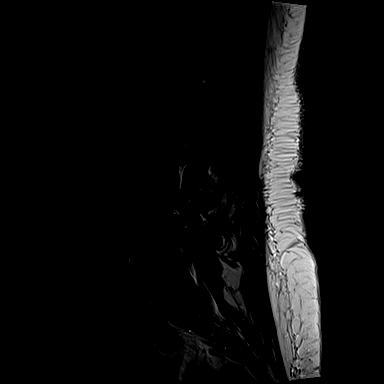
[im 11/18]
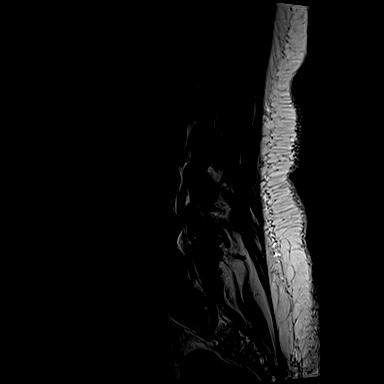
[im 14/18]
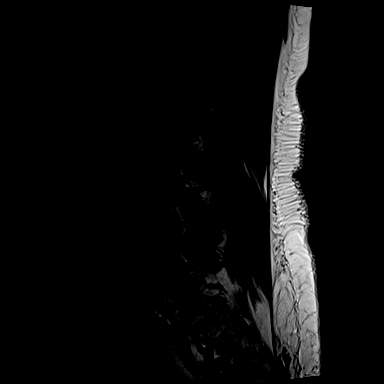
[im 18/18]
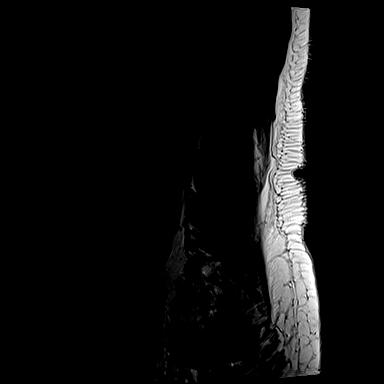

[Series 6: T1 · sagittal · 4.0mm · 0.73mm/px · 7 of 18 slices shown (1 of 2)]
[im 1/18]
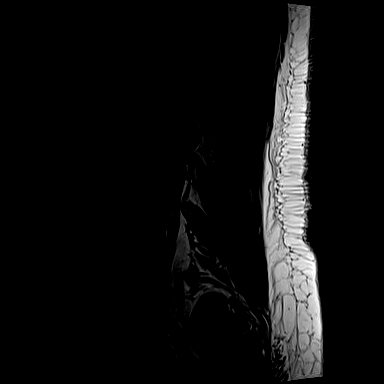
[im 3/18]
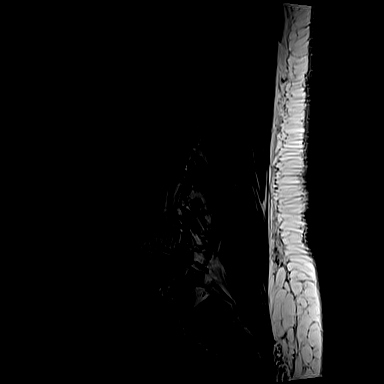
[im 6/18]
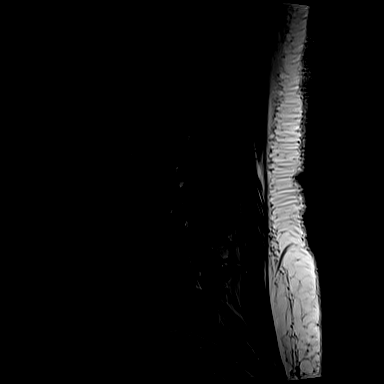
[im 9/18]
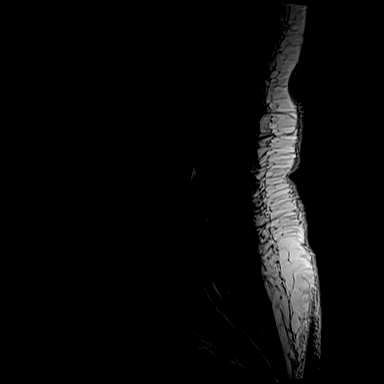
[im 12/18]
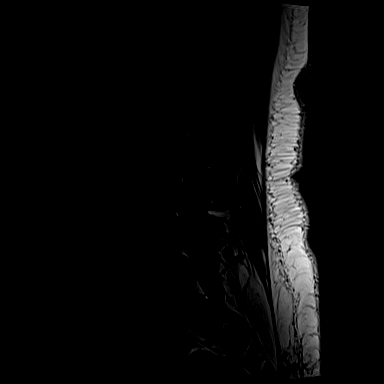
[im 15/18]
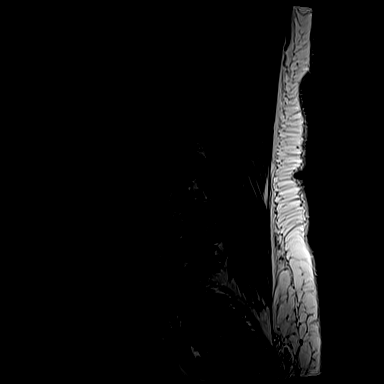
[im 18/18]
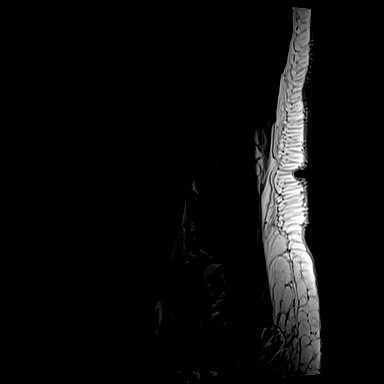

[Series 10: T1 · axial · 4.0mm · 0.35mm/px · z∈[-76,+74]mm · 3 of 39 slices shown (2 of 2)]
[im 6/39]
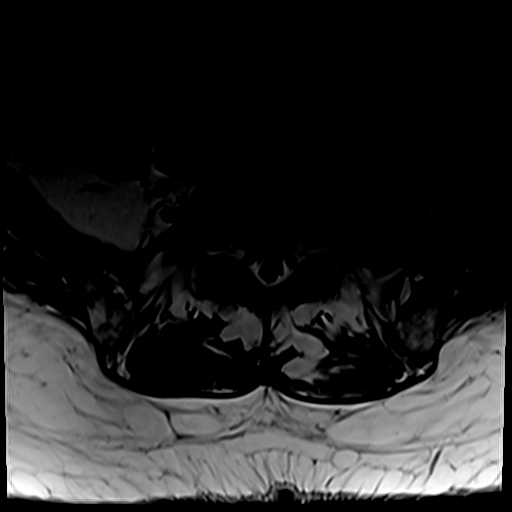
[im 21/39]
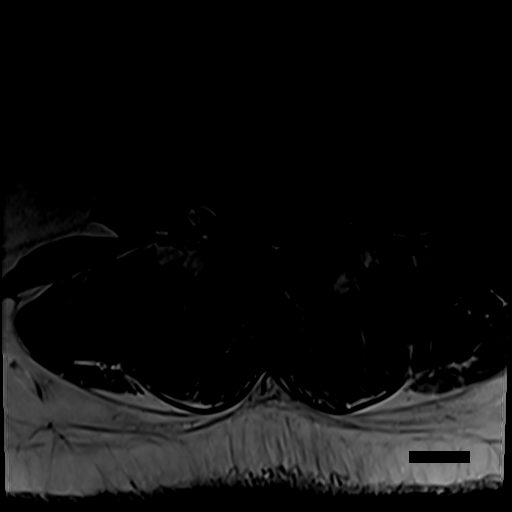
[im 33/39]
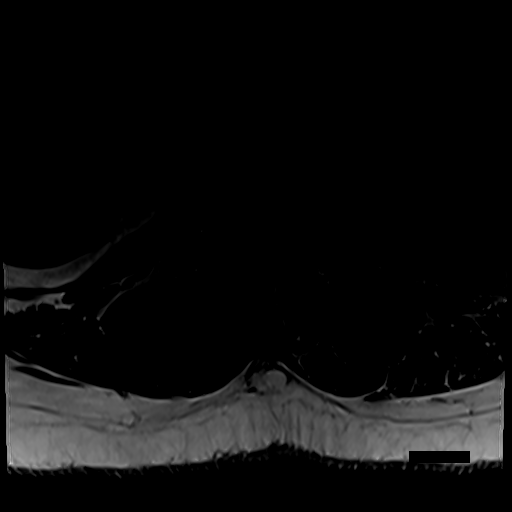

[Series 13: T2 · axial · 4.0mm · 0.35mm/px · z∈[-101,+103]mm · 8 of 39 slices shown (2 of 2)]
[im 1/39]
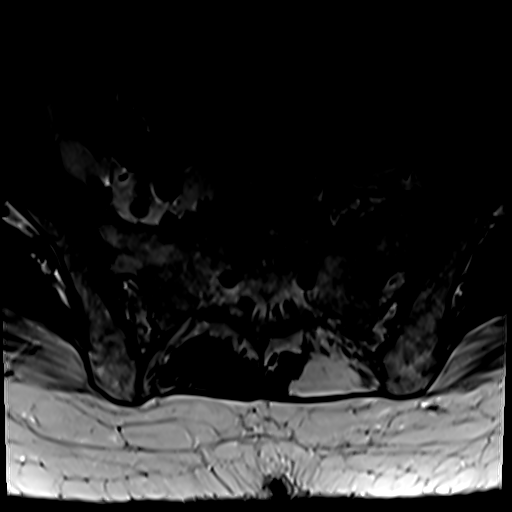
[im 6/39]
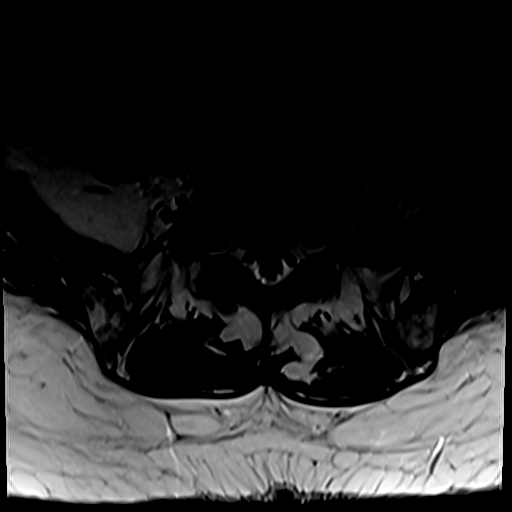
[im 12/39]
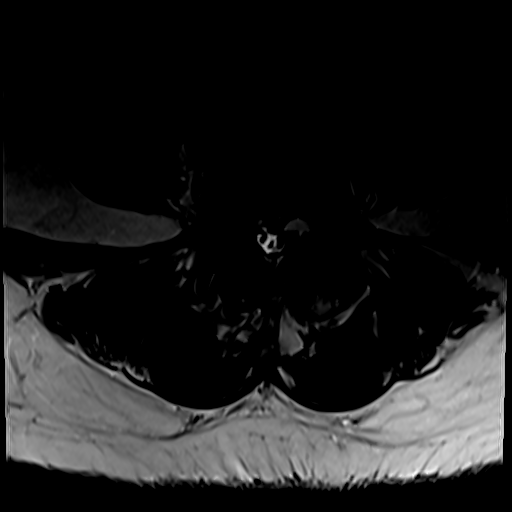
[im 18/39]
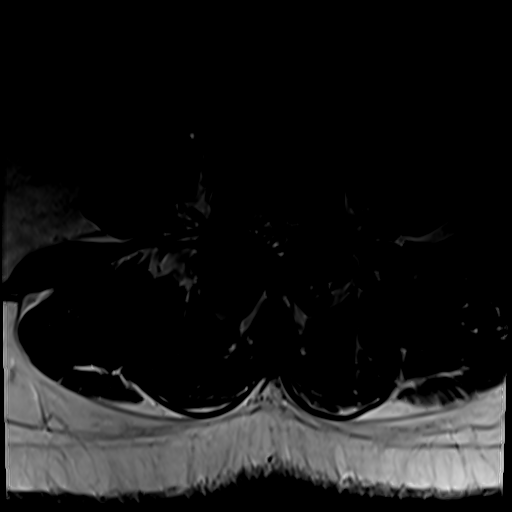
[im 21/39]
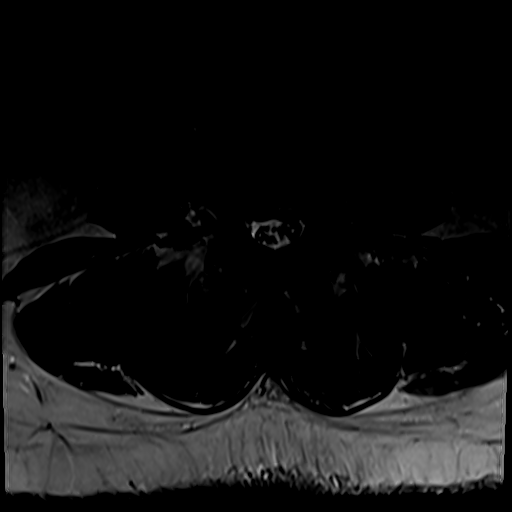
[im 27/39]
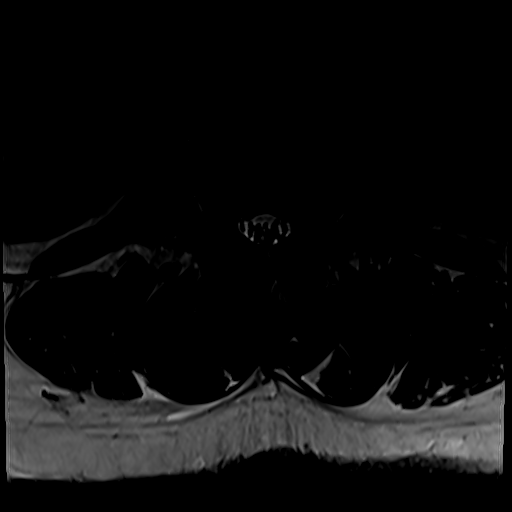
[im 33/39]
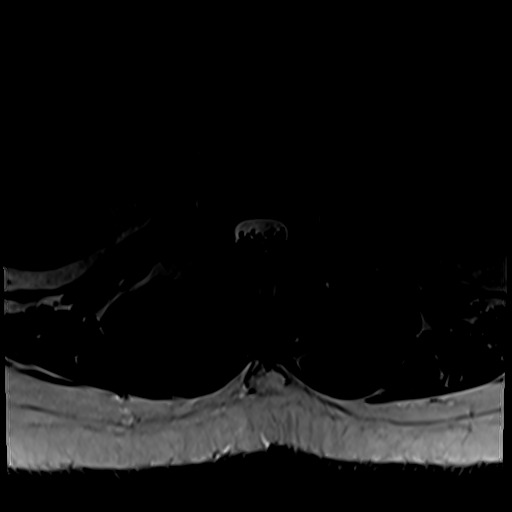
[im 39/39]
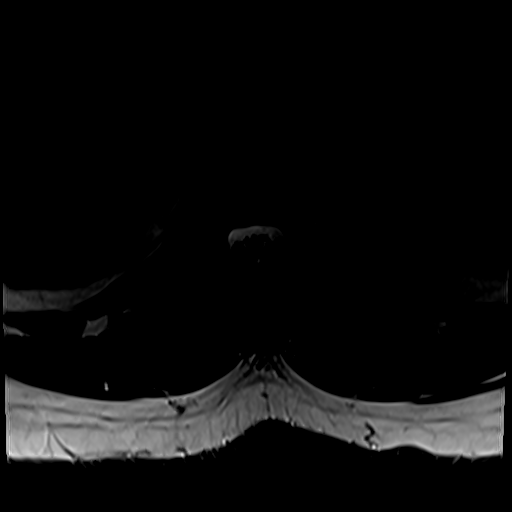

[24 of 48 positions shown; findings below may reference images not displayed]

FINDINGS: Segmentation: Numbering is kept consistent with the prior study.
There is partial lumbarization of S1.

Alignment:  Stable including trace anterolisthesis at L4-L5.

Vertebrae: Stable vertebral body heights. Marrow edema associated
with L4-L5 facet arthropathy. No suspicious osseous lesion.

Conus medullaris and cauda equina: Conus extends to the L1 level.
Conus and cauda equina appear normal.

Paraspinal and other soft tissues: Unremarkable.

Disc levels:

L1-L2: Facet arthropathy with ligamentum flavum infolding. No canal
or foraminal stenosis.

L2-L3: Facet arthropathy with ligamentum flavum infolding. No canal
or foraminal stenosis.

L3-L4: Disc bulge eccentric to the left. Facet arthropathy
ligamentum flavum infolding. Mild canal stenosis. No right foraminal
stenosis. Minor left foraminal stenosis.

L4-L5: Disc bulge. Marked facet arthropathy with joint effusions and
ligamentum flavum infolding. Severe canal stenosis with effacement
of subarticular recesses. Moderate right and mild left foraminal
stenosis.

L5-S1: Disc bulge. Facet arthropathy with ligamentum flavum
infolding. Moderate canal stenosis. Partial effacement of the
subarticular recesses. Mild to moderate right and mild left
foraminal stenosis.
IMPRESSION: Multilevel degenerative changes as detailed above with similar
appearance to 0303 examination. As before, there is severe canal and
subarticular recess stenosis at L4-L5. There is marked facet
arthropathy at this level.

## 2021-07-20 ENCOUNTER — Inpatient Hospital Stay (HOSPITAL_COMMUNITY): Payer: Medicare Other

## 2021-07-20 ENCOUNTER — Encounter (HOSPITAL_COMMUNITY): Payer: Self-pay

## 2021-07-20 ENCOUNTER — Emergency Department (HOSPITAL_COMMUNITY): Payer: Medicare Other

## 2021-07-20 ENCOUNTER — Inpatient Hospital Stay (HOSPITAL_COMMUNITY)
Admission: EM | Admit: 2021-07-20 | Discharge: 2021-07-23 | DRG: 286 | Disposition: A | Discharge status: Home / Self Care | Payer: Medicare Other | Attending: Internal Medicine | Admitting: Internal Medicine

## 2021-07-20 ENCOUNTER — Other Ambulatory Visit (HOSPITAL_COMMUNITY): Payer: Self-pay

## 2021-07-20 DIAGNOSIS — G8929 Other chronic pain: Secondary | ICD-10-CM | POA: Diagnosis present

## 2021-07-20 DIAGNOSIS — I2510 Atherosclerotic heart disease of native coronary artery without angina pectoris: Secondary | ICD-10-CM | POA: Diagnosis present

## 2021-07-20 DIAGNOSIS — R778 Other specified abnormalities of plasma proteins: Secondary | ICD-10-CM | POA: Diagnosis present

## 2021-07-20 DIAGNOSIS — Z79899 Other long term (current) drug therapy: Secondary | ICD-10-CM

## 2021-07-20 DIAGNOSIS — Z7984 Long term (current) use of oral hypoglycemic drugs: Secondary | ICD-10-CM

## 2021-07-20 DIAGNOSIS — I252 Old myocardial infarction: Secondary | ICD-10-CM

## 2021-07-20 DIAGNOSIS — F4310 Post-traumatic stress disorder, unspecified: Secondary | ICD-10-CM | POA: Diagnosis present

## 2021-07-20 DIAGNOSIS — J810 Acute pulmonary edema: Secondary | ICD-10-CM

## 2021-07-20 DIAGNOSIS — Z833 Family history of diabetes mellitus: Secondary | ICD-10-CM | POA: Diagnosis not present

## 2021-07-20 DIAGNOSIS — I110 Hypertensive heart disease with heart failure: Principal | ICD-10-CM | POA: Diagnosis present

## 2021-07-20 DIAGNOSIS — R0609 Other forms of dyspnea: Secondary | ICD-10-CM | POA: Diagnosis not present

## 2021-07-20 DIAGNOSIS — Z7982 Long term (current) use of aspirin: Secondary | ICD-10-CM

## 2021-07-20 DIAGNOSIS — M549 Dorsalgia, unspecified: Secondary | ICD-10-CM | POA: Diagnosis present

## 2021-07-20 DIAGNOSIS — Z823 Family history of stroke: Secondary | ICD-10-CM | POA: Diagnosis not present

## 2021-07-20 DIAGNOSIS — Z90710 Acquired absence of both cervix and uterus: Secondary | ICD-10-CM | POA: Diagnosis not present

## 2021-07-20 DIAGNOSIS — E119 Type 2 diabetes mellitus without complications: Secondary | ICD-10-CM | POA: Diagnosis not present

## 2021-07-20 DIAGNOSIS — E039 Hypothyroidism, unspecified: Secondary | ICD-10-CM | POA: Diagnosis present

## 2021-07-20 DIAGNOSIS — I509 Heart failure, unspecified: Secondary | ICD-10-CM

## 2021-07-20 DIAGNOSIS — I10 Essential (primary) hypertension: Secondary | ICD-10-CM | POA: Diagnosis not present

## 2021-07-20 DIAGNOSIS — Z79890 Hormone replacement therapy (postmenopausal): Secondary | ICD-10-CM

## 2021-07-20 DIAGNOSIS — J9601 Acute respiratory failure with hypoxia: Secondary | ICD-10-CM | POA: Diagnosis present

## 2021-07-20 DIAGNOSIS — M797 Fibromyalgia: Secondary | ICD-10-CM | POA: Diagnosis present

## 2021-07-20 DIAGNOSIS — F32A Depression, unspecified: Secondary | ICD-10-CM | POA: Diagnosis present

## 2021-07-20 DIAGNOSIS — E876 Hypokalemia: Secondary | ICD-10-CM | POA: Diagnosis present

## 2021-07-20 DIAGNOSIS — Z8249 Family history of ischemic heart disease and other diseases of the circulatory system: Secondary | ICD-10-CM

## 2021-07-20 DIAGNOSIS — I5043 Acute on chronic combined systolic (congestive) and diastolic (congestive) heart failure: Secondary | ICD-10-CM | POA: Diagnosis present

## 2021-07-20 DIAGNOSIS — Z6834 Body mass index (BMI) 34.0-34.9, adult: Secondary | ICD-10-CM

## 2021-07-20 DIAGNOSIS — I5041 Acute combined systolic (congestive) and diastolic (congestive) heart failure: Secondary | ICD-10-CM | POA: Diagnosis not present

## 2021-07-20 DIAGNOSIS — I161 Hypertensive emergency: Secondary | ICD-10-CM | POA: Diagnosis present

## 2021-07-20 DIAGNOSIS — E1165 Type 2 diabetes mellitus with hyperglycemia: Secondary | ICD-10-CM | POA: Diagnosis present

## 2021-07-20 DIAGNOSIS — Z87891 Personal history of nicotine dependence: Secondary | ICD-10-CM

## 2021-07-20 DIAGNOSIS — R7989 Other specified abnormal findings of blood chemistry: Secondary | ICD-10-CM | POA: Diagnosis present

## 2021-07-20 LAB — CBG MONITORING, ED
Glucose-Capillary: 126 mg/dL — ABNORMAL HIGH (ref 70–99)
Glucose-Capillary: 155 mg/dL — ABNORMAL HIGH (ref 70–99)

## 2021-07-20 LAB — CBC WITH DIFFERENTIAL/PLATELET
Abs Immature Granulocytes: 0.07 10*3/uL (ref 0.00–0.07)
Basophils Absolute: 0.1 10*3/uL (ref 0.0–0.1)
Basophils Relative: 1 %
Eosinophils Absolute: 0.3 10*3/uL (ref 0.0–0.5)
Eosinophils Relative: 3 %
HCT: 41.4 % (ref 36.0–46.0)
Hemoglobin: 12.8 g/dL (ref 12.0–15.0)
Immature Granulocytes: 1 %
Lymphocytes Relative: 57 %
Lymphs Abs: 6 10*3/uL — ABNORMAL HIGH (ref 0.7–4.0)
MCH: 26 pg (ref 26.0–34.0)
MCHC: 30.9 g/dL (ref 30.0–36.0)
MCV: 84.1 fL (ref 80.0–100.0)
Monocytes Absolute: 0.7 10*3/uL (ref 0.1–1.0)
Monocytes Relative: 7 %
Neutro Abs: 3.2 10*3/uL (ref 1.7–7.7)
Neutrophils Relative %: 31 %
Platelets: 277 10*3/uL (ref 150–400)
RBC: 4.92 MIL/uL (ref 3.87–5.11)
RDW: 16.1 % — ABNORMAL HIGH (ref 11.5–15.5)
WBC: 10.3 10*3/uL (ref 4.0–10.5)
nRBC: 0 % (ref 0.0–0.2)

## 2021-07-20 LAB — TROPONIN I (HIGH SENSITIVITY)
Troponin I (High Sensitivity): 70 ng/L — ABNORMAL HIGH (ref <18)
Troponin I (High Sensitivity): 83 ng/L — ABNORMAL HIGH (ref <18)
Troponin I (High Sensitivity): 93 ng/L — ABNORMAL HIGH (ref <18)

## 2021-07-20 LAB — CBC
HCT: 35 % — ABNORMAL LOW (ref 36.0–46.0)
Hemoglobin: 10.7 g/dL — ABNORMAL LOW (ref 12.0–15.0)
MCH: 25.2 pg — ABNORMAL LOW (ref 26.0–34.0)
MCHC: 30.6 g/dL (ref 30.0–36.0)
MCV: 82.5 fL (ref 80.0–100.0)
Platelets: 236 10*3/uL (ref 150–400)
RBC: 4.24 MIL/uL (ref 3.87–5.11)
RDW: 16.3 % — ABNORMAL HIGH (ref 11.5–15.5)
WBC: 8.7 10*3/uL (ref 4.0–10.5)
nRBC: 0 % (ref 0.0–0.2)

## 2021-07-20 LAB — BASIC METABOLIC PANEL
Anion gap: 14 (ref 5–15)
Anion gap: 11 (ref 5–15)
BUN: 12 mg/dL (ref 8–23)
BUN: 14 mg/dL (ref 8–23)
CO2: 25 mmol/L (ref 22–32)
CO2: 26 mmol/L (ref 22–32)
Calcium: 8.9 mg/dL (ref 8.9–10.3)
Calcium: 8.6 mg/dL — ABNORMAL LOW (ref 8.9–10.3)
Chloride: 102 mmol/L (ref 98–111)
Chloride: 102 mmol/L (ref 98–111)
Creatinine, Ser: 0.97 mg/dL (ref 0.44–1.00)
Creatinine, Ser: 0.95 mg/dL (ref 0.44–1.00)
GFR, Estimated: >60 mL/min (ref >60)
GFR, Estimated: >60 mL/min (ref >60)
Glucose, Bld: 217 mg/dL — ABNORMAL HIGH (ref 70–99)
Glucose, Bld: 136 mg/dL — ABNORMAL HIGH (ref 70–99)
Potassium: 3.5 mmol/L (ref 3.5–5.1)
Potassium: 3.4 mmol/L — ABNORMAL LOW (ref 3.5–5.1)
Sodium: 141 mmol/L (ref 135–145)
Sodium: 139 mmol/L (ref 135–145)

## 2021-07-20 LAB — BRAIN NATRIURETIC PEPTIDE: B Natriuretic Peptide: 270.5 pg/mL — ABNORMAL HIGH (ref 0.0–100.0)

## 2021-07-20 LAB — HEMOGLOBIN A1C
Hgb A1c MFr Bld: 6.7 % — ABNORMAL HIGH (ref 4.8–5.6)
Mean Plasma Glucose: 145.59 mg/dL

## 2021-07-20 LAB — GLUCOSE, CAPILLARY: Glucose-Capillary: 127 mg/dL — ABNORMAL HIGH (ref 70–99)

## 2021-07-20 LAB — ECHOCARDIOGRAM COMPLETE
Area-P 1/2: 4.01 cm2
S' Lateral: 6 cm

## 2021-07-20 LAB — MAGNESIUM: Magnesium: 1.9 mg/dL (ref 1.7–2.4)

## 2021-07-20 MED ORDER — NITROGLYCERIN IN D5W 200-5 MCG/ML-% IV SOLN
0.0000 ug/min | INTRAVENOUS | Status: DC
  Filled 2021-07-20: qty 250

## 2021-07-20 MED ORDER — SODIUM CHLORIDE 0.9% FLUSH: 3.0000 mL | INTRAVENOUS | Status: DC | PRN

## 2021-07-20 MED ORDER — ASPIRIN 81 MG PO CHEW
81.0000 mg | CHEWABLE_TABLET | Freq: Every day | ORAL | Status: DC
  Administered 2021-07-20 – 2021-07-23 (×3): 81 mg via ORAL
  Filled 2021-07-20 (×3): qty 1

## 2021-07-20 MED ORDER — INSULIN ASPART 100 UNIT/ML IJ SOLN: 0.0000 [IU] | Freq: Every day | INTRAMUSCULAR | Status: DC

## 2021-07-20 MED ORDER — ACETAMINOPHEN 650 MG RE SUPP: 650.0000 mg | Freq: Four times a day (QID) | RECTAL | Status: DC | PRN

## 2021-07-20 MED ORDER — BUPROPION HCL ER (XL) 300 MG PO TB24
300.0000 mg | ORAL_TABLET | Freq: Every morning | ORAL | Status: DC
  Administered 2021-07-20 – 2021-07-23 (×4): 300 mg via ORAL
  Filled 2021-07-20 (×4): qty 1

## 2021-07-20 MED ORDER — PERFLUTREN LIPID MICROSPHERE
1.0000 mL | INTRAVENOUS | Status: AC | PRN
  Administered 2021-07-20: 8 mL via INTRAVENOUS

## 2021-07-20 MED ORDER — ASPIRIN 81 MG PO CHEW
324.0000 mg | CHEWABLE_TABLET | Freq: Once | ORAL | Status: AC
Start: 2021-07-20 — End: 2021-07-20
  Administered 2021-07-20: 324 mg via ORAL
  Filled 2021-07-20: qty 4

## 2021-07-20 MED ORDER — POTASSIUM CHLORIDE CRYS ER 20 MEQ PO TBCR
20.0000 meq | EXTENDED_RELEASE_TABLET | Freq: Every day | ORAL | Status: DC
  Administered 2021-07-20 – 2021-07-23 (×4): 20 meq via ORAL
  Filled 2021-07-20 (×4): qty 1

## 2021-07-20 MED ORDER — ENOXAPARIN SODIUM 40 MG/0.4ML IJ SOSY
40.0000 mg | PREFILLED_SYRINGE | Freq: Every day | INTRAMUSCULAR | Status: DC
  Administered 2021-07-20 – 2021-07-21 (×2): 40 mg via SUBCUTANEOUS
  Filled 2021-07-20 (×2): qty 0.4

## 2021-07-20 MED ORDER — SODIUM CHLORIDE 0.9% FLUSH
3.0000 mL | Freq: Two times a day (BID) | INTRAVENOUS | Status: DC
  Administered 2021-07-22 – 2021-07-23 (×3): 3 mL via INTRAVENOUS

## 2021-07-20 MED ORDER — ASPIRIN 81 MG PO CHEW
81.0000 mg | CHEWABLE_TABLET | ORAL | Status: AC
  Administered 2021-07-21: 81 mg via ORAL
  Filled 2021-07-20: qty 1

## 2021-07-20 MED ORDER — AMLODIPINE BESYLATE 5 MG PO TABS
10.0000 mg | ORAL_TABLET | Freq: Every day | ORAL | Status: DC
  Administered 2021-07-20: 10 mg via ORAL
  Filled 2021-07-20: qty 2

## 2021-07-20 MED ORDER — IVABRADINE HCL 7.5 MG PO TABS
15.0000 mg | ORAL_TABLET | ORAL | Status: AC
  Administered 2021-07-20: 15 mg via ORAL
  Filled 2021-07-20: qty 2

## 2021-07-20 MED ORDER — SODIUM CHLORIDE 0.9 % IV SOLN: 250.0000 mL | INTRAVENOUS | Status: DC | PRN

## 2021-07-20 MED ORDER — ACETAMINOPHEN 325 MG PO TABS: 650.0000 mg | ORAL_TABLET | Freq: Four times a day (QID) | ORAL | Status: DC | PRN

## 2021-07-20 MED ORDER — POTASSIUM CHLORIDE CRYS ER 20 MEQ PO TBCR
40.0000 meq | EXTENDED_RELEASE_TABLET | Freq: Once | ORAL | Status: AC
  Administered 2021-07-20: 40 meq via ORAL
  Filled 2021-07-20: qty 2

## 2021-07-20 MED ORDER — LEVOTHYROXINE SODIUM 25 MCG PO TABS
25.0000 ug | ORAL_TABLET | Freq: Every day | ORAL | Status: DC
  Administered 2021-07-20 – 2021-07-23 (×4): 25 ug via ORAL
  Filled 2021-07-20 (×4): qty 1

## 2021-07-20 MED ORDER — LOSARTAN POTASSIUM 25 MG PO TABS
25.0000 mg | ORAL_TABLET | Freq: Every day | ORAL | Status: DC
  Administered 2021-07-21 – 2021-07-23 (×3): 25 mg via ORAL
  Filled 2021-07-20 (×3): qty 1

## 2021-07-20 MED ORDER — PRAVASTATIN SODIUM 10 MG PO TABS
20.0000 mg | ORAL_TABLET | Freq: Every morning | ORAL | Status: DC
  Administered 2021-07-20 – 2021-07-23 (×4): 20 mg via ORAL
  Filled 2021-07-20 (×4): qty 2

## 2021-07-20 MED ORDER — PRAZOSIN HCL 1 MG PO CAPS: 1.0000 mg | ORAL_CAPSULE | Freq: Every evening | ORAL | Status: DC | PRN

## 2021-07-20 MED ORDER — SENNOSIDES-DOCUSATE SODIUM 8.6-50 MG PO TABS
1.0000 | ORAL_TABLET | Freq: Every evening | ORAL | Status: DC | PRN
Start: 2021-07-20 — End: 2021-07-23

## 2021-07-20 MED ORDER — ONDANSETRON HCL 4 MG/2ML IJ SOLN: 4.0000 mg | Freq: Four times a day (QID) | INTRAMUSCULAR | Status: DC | PRN

## 2021-07-20 MED ORDER — ONDANSETRON HCL 4 MG PO TABS: 4.0000 mg | ORAL_TABLET | Freq: Four times a day (QID) | ORAL | Status: DC | PRN

## 2021-07-20 MED ORDER — SODIUM CHLORIDE 0.9 % IV SOLN: INTRAVENOUS | Status: DC

## 2021-07-20 MED ORDER — GABAPENTIN 100 MG PO CAPS
100.0000 mg | ORAL_CAPSULE | Freq: Every day | ORAL | Status: DC
  Administered 2021-07-20 – 2021-07-22 (×3): 100 mg via ORAL
  Filled 2021-07-20 (×3): qty 1

## 2021-07-20 MED ORDER — SODIUM CHLORIDE 0.9% FLUSH
3.0000 mL | Freq: Two times a day (BID) | INTRAVENOUS | Status: DC
  Administered 2021-07-21 – 2021-07-22 (×2): 3 mL via INTRAVENOUS

## 2021-07-20 MED ORDER — INSULIN ASPART 100 UNIT/ML IJ SOLN
0.0000 [IU] | Freq: Three times a day (TID) | INTRAMUSCULAR | Status: DC
  Administered 2021-07-20: 1 [IU] via SUBCUTANEOUS

## 2021-07-20 MED ORDER — FUROSEMIDE 10 MG/ML IJ SOLN
40.0000 mg | Freq: Two times a day (BID) | INTRAMUSCULAR | Status: DC
Start: 2021-07-20 — End: 2021-07-20
  Administered 2021-07-20: 40 mg via INTRAVENOUS
  Filled 2021-07-20: qty 4

## 2021-07-20 MED ORDER — ATENOLOL 25 MG PO TABS
50.0000 mg | ORAL_TABLET | Freq: Every day | ORAL | Status: DC
  Administered 2021-07-20: 50 mg via ORAL
  Filled 2021-07-20: qty 2

## 2021-07-20 MED ORDER — VENLAFAXINE HCL ER 75 MG PO CP24
75.0000 mg | ORAL_CAPSULE | Freq: Every day | ORAL | Status: DC
  Administered 2021-07-20 – 2021-07-23 (×5): 75 mg via ORAL
  Filled 2021-07-20 (×6): qty 1

## 2021-07-20 NOTE — Progress Notes (Signed)
Right and left heart cath planned for tomorrow per Dr Izora Ribas, NPO after midnight, orders placed.

## 2021-07-20 NOTE — Progress Notes (Signed)
PROGRESS NOTE    Stacey Fletcher  ZOX:[id number] DOB: [birth year] DOA: 07/20/2021 PCP: Soundra Pilon, FNP   Chief Complaint  Patient presents with   Respiratory Distress    Brief Narrative:    This is a no charge note as patient was seen and admitted earlier by Dr. Antionette Char, chart, imaging and labs were reviewed, patient was seen and examined.   HPI: Stacey Fletcher is a pleasant 71 y.o. female with medical history significant for hypertension, type 2 diabetes mellitus, depression, fibromyalgia, and remote history of heart catheterization without stents, now presenting to the emergency department with shortness of breath and chest discomfort.  Patient reports going to bed last night in her usual state but woke early this morning with shortness of breath and central, nonradiating chest discomfort.  EMS was called and found her BP to be 220/130 with oxygen saturation of 65% on room air.  She was given 3 doses of nitroglycerin, started on CPAP, and brought into the ED.  She denies recent cough, change in her chronic mild leg swelling, fever, chills, or change in medications.   ED Course: Upon arrival to the ED, patient is found to be afebrile, saturating well on BiPAP, slightly tachypneic and tachycardic, and with SBP 80s-130.  EKG features sinus tachycardia with rate 119 and LVH with repolarization abnormality.  Chest x-ray notable for cardiomegaly, vascular congestion, and possible early interstitial edema.  Chemistry panel notable for glucose 217.  Troponin was 70 and then 83.  BNP is elevated to 271.  Nitroglycerin infusion was ordered but never started due to low pressures.  She was transitioned from BiPAP to nasal cannula oxygen.  ED physician discussed case with cardiology and hospitalists were asked to admit.  Assessment & Plan:   Principal Problem:   Acute respiratory failure with hypoxia (HCC) Active Problems:   Hypothyroidism   Diabetes mellitus (HCC)   Hypertension   Depression    Elevated troponin   Acute CHF (congestive heart failure) (HCC)    Acute CHF;/acute hypoxic respiratory failure Heloise Beecham pulmonary edema - Pt woke with acute respiratory distress and chest discomfort, was found by EMS to have BP 220/130 and O2 sat 65%, and was given 3 doses NTG and started on CPAP pta  - BP normalized with NTG, chest discomfort resolved, and respiratory status improved dramatically  - BNP is elevated to 271 and acute CHF findings noted on CXR  - Scenario most suggestive of flash pulmonary edema, less likely non-STEMI  - She continues to require supplemental O2 in ED, plan to diurese with IV Lasix, continue BP-control, trend troponin, check echocardiogram   -Cardiology consulted for further evaluation.   Elevated troponin  - HS troponin was 70 then 83 two hrs later in ED  - She had chest discomfort when she woke with SOB, but none since arrival in ED  - She had remote stress test and cath, denies hx of stents, and records not available  - Suspect this is demand ischemia in setting of acute respiratory distress and severely elevated BP  - Trend troponin, check echocardiogram, continue ASA, statin, and beta-blocker    Hypertension  - BP was 220/130 initially with EMS, was treated with 3 doses NTG pta and has had SBP in 80s-130 since  - Continue Norvasc and atenolol as BP allows     Type II DM  - Check CBGs and use low-intensity SSI for now     Depression; PTSD   - Continue Wellbutrin, Effexor, Minipress  DVT prophylaxis: Lovenox Code Status: Full Family Communication: None nat bedside Disposition:   Status is: Inpatient    Consultants:  Cardiology   Subjective:  Reports her dyspnea has improved, she does report generalized weakness and fatigue  Objective: Vitals:   07/20/21 0845 07/20/21 0900 07/20/21 0915 07/20/21 0945  BP: 123/63 (!) 135/100 (!) 150/101 110/69  Pulse: (!) 101 (!) 110 (!) 112 (!) 105  Resp: 16 (!) 23 19 16   Temp:      TempSrc:       SpO2: 96% 95% 97% 97%   No intake or output data in the 24 hours ending 07/20/21 1115 There were no vitals filed for this visit.  Examination:  Awake Alert, Oriented X 3, No new F.N deficits, Normal affect Symmetrical Chest wall movement, Good air movement bilaterally, CTAB RRR,No Gallops,Rubs or new Murmurs, No Parasternal Heave +ve B.Sounds, Abd Soft, No tenderness, No rebound - guarding or rigidity. No Cyanosis, Clubbing or edema, No new Rash or bruise      Data Reviewed: I have personally reviewed following labs and imaging studies  CBC: Recent Labs  Lab 07/20/21 0024 07/20/21 0457  WBC 10.3 8.7  NEUTROABS 3.2  --   HGB 12.8 10.7*  HCT 41.4 35.0*  MCV 84.1 82.5  PLT 277 236    Basic Metabolic Panel: Recent Labs  Lab 07/20/21 0024 07/20/21 0457  NA 141 139  K 3.5 3.4*  CL 102 102  CO2 25 26  GLUCOSE 217* 136*  BUN 12 14  CREATININE 0.97 0.95  CALCIUM 8.9 8.6*  MG  --  1.9    GFR: CrCl cannot be calculated (Unknown ideal weight.).  Liver Function Tests: No results for input(s): "AST", "ALT", "ALKPHOS", "BILITOT", "PROT", "ALBUMIN" in the last 168 hours.  CBG: Recent Labs  Lab 07/20/21 0847  GLUCAP 126*     No results found for this or any previous visit (from the past 240 hour(s)).       Radiology Studies: DG Chest Port 1 View  Result Date: 07/20/2021 CLINICAL DATA:  Shortness of breath EXAM: PORTABLE CHEST 1 VIEW COMPARISON:  11/11/2018 FINDINGS: Cardiomegaly with vascular congestion. Interstitial prominence may reflect early interstitial edema. Bibasilar atelectasis. No effusions or acute bony abnormality. IMPRESSION: Cardiomegaly, vascular congestion and possible early interstitial edema. Bibasilar atelectasis. Electronically Signed   By: Charlett Nose M.D.   On: 07/20/2021 01:39        Scheduled Meds:  amLODipine  10 mg Oral Daily   aspirin  81 mg Oral Daily   atenolol  50 mg Oral Daily   buPROPion  300 mg Oral q morning    enoxaparin (LOVENOX) injection  40 mg Subcutaneous Daily   furosemide  40 mg Intravenous BID   gabapentin  100 mg Oral QHS   insulin aspart  0-5 Units Subcutaneous QHS   insulin aspart  0-6 Units Subcutaneous TID WC   levothyroxine  25 mcg Oral QAC breakfast   potassium chloride SA  20 mEq Oral Daily   pravastatin  20 mg Oral q AM   sodium chloride flush  3 mL Intravenous Q12H   venlafaxine XR  75 mg Oral Q breakfast   Continuous Infusions:   LOS: 0 days      Huey Bienenstock, MD [facility]   To contact the attending provider between 7A-7P or the covering provider during after hours 7P-7A, please log into the web site [email or web address] and access using universal [place] password for that web site. If  you do not have the password, please call the hospital operator.  07/20/2021, 11:15 AM

## 2021-07-20 NOTE — Consult Note (Addendum)
Cardiology Consultation:   Patient ID: Stacey Fletcher MRN: [id number]; DOB: 1950-09-17  Admit date: 07/20/2021 Date of Consult: 07/20/2021  PCP:  Soundra Pilon, FNP   [facility] Providers Cardiologist:  Lance Muss, MD   {   Patient Profile:   Stacey Fletcher is a 71 y.o. female with PMH of hypertension, type 2 diabetes, depression, fibromyalgia, hypothyroidism, chronic back pain, formal tobacco use, who is being seen 07/20/2021 for the evaluation of elevated troponin/CHF at the request of Stacey Fletcher.  History of Present Illness:   Stacey Fletcher with above past medical history presented to the ER today complaining chest pain.  She does not follow cardiology routinely.  She was initially seen by Stacey Fletcher 08/20/2014 with chest pain, that was atypical, had negative EKG and troponin.  Recommended outpatient Myoview for ischemic evaluation but never followed up.  She was lost to follow-up since.   She was last seen by Stacey Fletcher 06/28/2020 as a new patient for preop evaluation for lumbar fusion.  Reportedly, she watched her son being murdered in [place] in 1990 where she had chest pain required hospitalization.  She had a cardiac cath, no stent was placed.  She is able to tolerate mowing the lawns besides some knee pain.  She had no anginal symptoms and no further cardiac testing was recommended before operation.  Patient presented to the ER today after midnight complaining sudden onset of shortness of breath and chest pain.  She woke up with above symptoms.  She was in normal state of health last night.  EMS was called, she was found to be hypoxic with pulse ox 60% on room air and bilateral crackles.  She was also noted hypertensive with blood pressure 220/130 at field.  She required BiPAP support, was given 3 doses of sublingual nitroglycerin.  Admission diagnostic revealed hyperglycemia 217, otherwise unremarkable CBC differential and BMP.  BNP elevated to 70.  High sensitive  troponin 70 >83 >93.  Chest x-ray revealed cardiomegaly with vascular congestion and possible early interstitial edema, bibasilar atelectasis. EKG reveals sinus tachycardia with ventricular rate of 119 bpm, non-specific ST-T abnormalities.  She was concern for acute heart failure/flash pulmonary edema, admitted to hospital medicine for acute hypoxic respiratory failure, started on IV Lasix, cardiology is consulted for further input.  Upon encounter, she states she had sudden onset of SOB and chest pain last night. Chest was extremely uncomfortable, non-radiating. She states her BP is normal 120/80s and takes her BP meds daily. Her regular activity is limited due to knee pain from osteoarthritis. She states her SOB and chest pain had resolved. She had urinated a lot. She is feeling well now. She denied any dizziness, syncope, nausea, vomiting, leg edema, orthopnea.     Past Medical History:  Diagnosis Date   Coronary artery disease    Depression    Diabetes mellitus    Fibromyalgia    Goiter    Hypertension    MI (myocardial infarction) (HCC)    Osteoarthritis    Pneumonia    10+ years ago.    Past Surgical History:  Procedure Laterality Date   ABDOMINAL HYSTERECTOMY     BREAST SURGERY     CARDIAC CATHETERIZATION     1995     Home Medications:  Prior to Admission medications   Medication Sig Start Date End Date Taking? Authorizing Provider  amLODipine (NORVASC) 10 MG tablet Take 10 mg by mouth in the morning. 06/03/11  Stacey Jericho, MD  aspirin 81  MG chewable tablet Chew 81 mg by mouth in the morning.   Yes [provider]  atenolol-chlorthalidone (TENORETIC) 50-25 MG per tablet Take 1 tablet by mouth in the morning. 04/06/14  Yes [provider]  buPROPion (WELLBUTRIN XL) 300 MG 24 hr tablet Take 300 mg by mouth every morning. 01/25/20  Yes [provider]  etodolac (LODINE) 500 MG tablet Take 500 mg by mouth 2 (two) times daily as needed for pain.  09/12/18  Yes [provider]  gabapentin (NEURONTIN) 100 MG capsule TAKE 1 CAPSULE(100 MG) BY MOUTH AT BEDTIME Patient taking differently: Take 100 mg by mouth at bedtime. 04/24/21  Yes Stacey Champagne, MD  glipiZIDE (GLUCOTROL XL) 10 MG 24 hr tablet Take 10 mg by mouth 2 (two) times daily. Lunch & supper 02/29/20  Yes [provider]  HYDROcodone-acetaminophen (NORCO/VICODIN) 5-325 MG tablet Take 1 tablet by mouth 4 (four) times daily as needed for moderate pain. 06/23/21  Yes [provider]  levothyroxine (SYNTHROID) 25 MCG tablet Take 25 mcg by mouth daily before breakfast. 12/26/19  Yes [provider]  metFORMIN (GLUCOPHAGE) 1000 MG tablet Take 1,000 mg by mouth 2 (two) times daily. 05/11/14  Yes [provider]  polyethylene glycol (MIRALAX / GLYCOLAX) 17 g packet Take 17 g by mouth daily as needed for moderate constipation or mild constipation.   Yes [provider]  Potassium Chloride ER 20 MEQ TBCR Take 1 tablet by mouth daily. 04/29/20  Yes [provider]  pravastatin (PRAVACHOL) 20 MG tablet Take 20 mg by mouth in the morning. 11/05/18  Yes [provider]  RESTASIS 0.05 % ophthalmic emulsion Place 1 drop into both eyes 2 (two) times a week. 02/26/20  Yes [provider]  venlafaxine XR (EFFEXOR-XR) 75 MG 24 hr capsule Take 75 mg by mouth in the morning, at noon, and at bedtime. 02/25/20  Yes [provider]  OZEMPIC, 0.25 OR 0.5 MG/DOSE, 2 MG/1.5ML SOPN Inject 0.25 mg into the skin every Sunday. 01/08/20   [provider]    Inpatient Medications: Scheduled Meds:  amLODipine  10 mg Oral Daily   aspirin  81 mg Oral Daily   atenolol  50 mg Oral Daily   buPROPion  300 mg Oral q morning   enoxaparin (LOVENOX) injection  40 mg Subcutaneous Daily   furosemide  40 mg Intravenous BID   gabapentin  100 mg Oral QHS   insulin aspart  0-5 Units Subcutaneous QHS   insulin aspart  0-6 Units Subcutaneous  TID WC   levothyroxine  25 mcg Oral QAC breakfast   potassium chloride SA  20 mEq Oral Daily   pravastatin  20 mg Oral q AM   sodium chloride flush  3 mL Intravenous Q12H   venlafaxine XR  75 mg Oral Q breakfast   Continuous Infusions:  PRN Meds: acetaminophen **OR** acetaminophen, ondansetron **OR** ondansetron (ZOFRAN) IV, senna-docusate  Allergies:    Allergies  Allergen Reactions   Prednisone Shortness Of Breath and Rash   Ace Inhibitors Other (See Comments)    Dizzy headaches, crawling feeling inside of arm   Empagliflozin Other (See Comments)    uti/yeast infection   Sitagliptin Itching   Procardia [Nifedipine] Palpitations    "heart attack symptoms"    Social History:   Social History   Socioeconomic History   Marital status: Divorced    Spouse name: Not on file   Number of children: Not on file   Years of  education: Not on file   Highest education level: Not on file  Occupational History   Not on file  Tobacco Use   Smoking status: Former    Types: Cigarettes    Quit date: 07/07/2018    Years since quitting: 3.0   Smokeless tobacco: Never  Vaping Use   Vaping Use: Never used  Substance and Sexual Activity   Alcohol use: No   Drug use: No   Sexual activity: Not Currently  Other Topics Concern   Not on file  Social History Narrative   Not on file   Social Determinants of Health   Financial Resource Strain: Not on file  Food Insecurity: Not on file  Transportation Needs: Not on file  Physical Activity: Not on file  Stress: Not on file  Social Connections: Not on file  Intimate Partner Violence: Not on file    Family History:    Family History  Problem Relation Age of Onset   Hypertension Mother    Diabetes Mother    Stroke Mother    Hypertension Other    Diabetes Other      ROS:  Constitutional: Denied fever, chills, malaise, night sweats Eyes: Denied vision change or loss Ears/Fletcher/Mouth/Throat: Denied ear ache, sore throat, coughing,  sinus pain Cardiovascular: see HPI  Respiratory: see HPI Gastrointestinal: Denied nausea, vomiting, abdominal pain, diarrhea Genital/Urinary: Denied dysuria, hematuria, urinary frequency/urgency Musculoskeletal: OA Skin: Denied rash, wound Neuro: Denied headache, dizziness, syncope Psych: history of depression/anxiety  Endocrine: history of diabetes   Physical Exam/Data:   Vitals:   07/20/21 0845 07/20/21 0900 07/20/21 0915 07/20/21 0945  BP: 123/63 (!) 135/100 (!) 150/101 110/69  Pulse: (!) 101 (!) 110 (!) 112 (!) 105  Resp: 16 (!) 23 19 16   Temp:      TempSrc:      SpO2: 96% 95% 97% 97%   No intake or output data in the 24 hours ending 07/20/21 1038    06/28/2020    2:09 PM 05/02/2020    1:37 PM 05/02/2020    9:56 AM  Last 3 Weights  Weight (lbs) 238 lb 239 lb 239 lb 8 oz  Weight (kg) 107.956 kg 108.41 kg 108.636 kg     There is no height or weight on file to calculate BMI.   Vitals:  Vitals:   07/20/21 0915 07/20/21 0945  BP: (!) 150/101 110/69  Pulse: (!) 112 (!) 105  Resp: 19 16  Temp:    SpO2: 97% 97%   General Appearance: In no apparent distress,sitting in bed  HEENT: Normocephalic, atraumatic.  Neck: Supple, trachea midline, no JVDs Cardiovascular: Regular rate and rhythm, normal S1-S2,  no murmur Respiratory: Resting breathing unlabored, lungs sounds clear to auscultation bilaterally, no use of accessory muscles. On room air.  No wheezes, rales or rhonchi.   Gastrointestinal: Bowel sounds positive, abdomen soft Extremities: Able to move all extremities in bed without difficulty, no edema of BLE Musculoskeletal: Normal muscle bulk and tone Skin: Intact, warm, dry. No rashes or petechiae noted in exposed areas.  Neurologic: Alert, oriented to person, place and time. Fluent speech, no facial droop, no cognitive deficit Psychiatric: Normal affect. Mood is appropriate.     EKG:  The EKG was personally reviewed and demonstrates: Sinus tachycardia with  ventricular rate of 119, specific ST-T abnormality  Telemetry:  Telemetry was personally reviewed and demonstrates:  Sinus tachycardia 110-120s , occasional PVCs   Relevant CV Studies:  Echocardiogram is pending  Laboratory Data:  High Sensitivity  Troponin:   Recent Labs  Lab 07/20/21 0024 07/20/21 0229 07/20/21 0457  TROPONINIHS 70* 83* 93*     Chemistry Recent Labs  Lab 07/20/21 0024 07/20/21 0457  NA 141 139  K 3.5 3.4*  CL 102 102  CO2 25 26  GLUCOSE 217* 136*  BUN 12 14  CREATININE 0.97 0.95  CALCIUM 8.9 8.6*  MG  --  1.9  GFRNONAA >60 >60  ANIONGAP 14 11    No results for input(s): "PROT", "ALBUMIN", "AST", "ALT", "ALKPHOS", "BILITOT" in the last 168 hours. Lipids No results for input(s): "CHOL", "TRIG", "HDL", "LABVLDL", "LDLCALC", "CHOLHDL" in the last 168 hours.  Hematology Recent Labs  Lab 07/20/21 0024 07/20/21 0457  WBC 10.3 8.7  RBC 4.92 4.24  HGB 12.8 10.7*  HCT 41.4 35.0*  MCV 84.1 82.5  MCH 26.0 25.2*  MCHC 30.9 30.6  RDW 16.1* 16.3*  PLT 277 236   Thyroid No results for input(s): "TSH", "FREET4" in the last 168 hours.  BNP Recent Labs  Lab 07/20/21 0024  BNP 270.5*    DDimer No results for input(s): "DDIMER" in the last 168 hours.   Radiology/Studies:  DG Chest Port 1 View  Result Date: 07/20/2021 CLINICAL DATA:  Shortness of breath EXAM: PORTABLE CHEST 1 VIEW COMPARISON:  11/11/2018 FINDINGS: Cardiomegaly with vascular congestion. Interstitial prominence may reflect early interstitial edema. Bibasilar atelectasis. No effusions or acute bony abnormality. IMPRESSION: Cardiomegaly, vascular congestion and possible early interstitial edema. Bibasilar atelectasis. Electronically Signed   By: Charlett Fletcher M.D.   On: 07/20/2021 01:39     Assessment and Plan:   Elevated troponin Chest pain -Presented with sudden onset of SOB and chest pain, in the setting of hypertensive emergency -HS trop 70 >83 >93 , not consistent with ACS  -EKG  with non specific ST-T abnormalities  - Echo pending  - recommended Myoview 2016 but never completed, self reports cath at different states 1990 without stent placement  - + risk  factor for CAD, recommend non-urgent ischemic workup with coronary CT today - continue ASA 81mg , pravastatin 20mg    Acute hypoxic respiratory failure, resolved  Flash pulmonary edema Hypertensive emergency  - Initial BP 220/130 reported by EMS, now controlled  - BNP 270, POA - CXR with CHF, POA - no historical symptoms suggestive of CHF, likely flash pulmonary edema, agree with amlodipine 10mg , atenolol 50mg , may transition IV Lasix to PO now, euvolemic on exam - will follow Echo  - monitor daily weight and I&O and daily electrolytes   HTN - BP controlled now with above meds   Type 2 DM  Depression  PTSD  Fibromyalgia Hypothyrodism - per hospitalist     Risk Assessment/Risk Scores:    For questions or updates, please contact [facility] Please consult [email or web address] for contact info under    Signed, Stacey Bender, NP  07/20/2021 10:38 AM   Personally seen and examined. Agree with APP above with the following comments:  Briefly 71 yo W with a history of HTN with DM (UTIs and itching with SGLT2i), Morbid obesity, and prior history or HTN emergency who presents with flash pulmonary edema.  She notes that she does not remember the event from yesterday evening.  Patient notes that she had HTN emergency around the death of her son.  Feels heart heart beat strongly.  Had LHC (this was in [place]) with widely patent CORS. They moved to [place] in 2001.   Was doing well until 2015, where she couldn't keep  her head up.  She had CTPE that did not show PE or CAC.  At baseline has rare chest pain that doesn't radiate.  No SOB but is fairly sedentary over the past two years due to L knee osteoarthritis.  07/19/21 has sudden onset SOB.  Felt like she couldn't breath.  EMS was called and found to have hypoxia  and BP 220/130. On BiPAP, with nitro and lasix she is presently asymptomatic.  Exam notable for RRR, CTAB.  Heart rate 90 has 18 G L AC.  Morbid Obesity.  Otherwise as above.  Labs notable for troponin 70-> 93 EKG sinus tachycardia rate 112 with LVH and repolarization changes Tele: SR and sinus tach  Would recommend  - most likely flash pulmonary edema would discharge on aldactone 25 mg PO daily - will follow her echo, suspect LVH - Will plan for CCTA (with ivabradine) for coronary assessment  Rest as above  Stacey Lam, MD Cardiologist Hypertrophic Cardiomyopathy [facility]  [address] [place], [place] [address] [phone]  12:13 PM   ADDENDUM:  LVEF 30% on Echo with apical hypokinesis (contrasted study) Had issues with Cardiac CT (hypotension)  Patient feels well BP 116/69 No CP; no upcoming procedures.  Risks and benefits of cardiac catheterization have been discussed with the patient.  These include bleeding, infection, kidney damage, stroke, heart attack, death.  The patient understands these risks and is willing to proceed.  RHC and LHC  Access recommendations: R radial OK Procedural considerations ok for PCI if appropriate  Stacey Lam, MD Cardiologist Hypertrophic Cardiomyopathy [facility]  [address], [address] [place], [place] [address] [phone]  4:15 PM

## 2021-07-20 NOTE — ED Provider Notes (Signed)
[facility] EMERGENCY DEPARTMENT Provider Note   CSN: [id number] Arrival date & time: 07/20/21  0008     History  Chief Complaint  Patient presents with   Respiratory Distress   Level 5 caveat due to acuity of condition Stacey Fletcher is a 71 y.o. female.  The history is provided by the patient.  Shortness of Breath Severity:  Severe Onset quality:  Sudden Timing:  Constant Progression:  Worsening Chronicity:  New Associated symptoms: chest pain   Patient with history of CAD, diabetes, obesity presents with respiratory distress.  Patient reports she woke up feeling short of breath and EMS was called.  On their arrival patient was hypoxic to the 60s and with crackles in all fields.  Patient was placed on noninvasive ventilation and given oral nitroglycerin. Patient also reports chest pain with her shortness of breath. She does not recall this happening previously    Past Medical History:  Diagnosis Date   Coronary artery disease    Depression    Diabetes mellitus    Fibromyalgia    Goiter    Hypertension    MI (myocardial infarction) (HCC)    Osteoarthritis    Pneumonia    10+ years ago.    Home Medications Prior to Admission medications   Medication Sig Start Date End Date Taking? Authorizing Provider  amLODipine (NORVASC) 10 MG tablet Take 10 mg by mouth in the morning. 06/03/11   Azalia Bilis, MD  aspirin 81 MG chewable tablet Chew 81 mg by mouth in the morning.    [provider]  atenolol-chlorthalidone (TENORETIC) 50-25 MG per tablet Take 1 tablet by mouth in the morning. 04/06/14   [provider]  buPROPion (WELLBUTRIN XL) 300 MG 24 hr tablet Take 300 mg by mouth every morning. 01/25/20   [provider]  busPIRone (BUSPAR) 5 MG tablet Take 5 mg by mouth 3 (three) times daily. 04/29/20   [provider]  etodolac (LODINE) 500 MG tablet Take 500 mg by mouth 2 (two) times daily as needed for pain. 09/12/18    [provider]  gabapentin (NEURONTIN) 100 MG capsule TAKE 1 CAPSULE(100 MG) BY MOUTH AT BEDTIME 04/24/21   Kerrin Champagne, MD  glipiZIDE (GLUCOTROL XL) 10 MG 24 hr tablet Take 10 mg by mouth 2 (two) times daily. Lunch & supper 02/29/20   [provider]  levothyroxine (SYNTHROID) 25 MCG tablet Take 25 mcg by mouth daily before breakfast. 12/26/19   [provider]  metFORMIN (GLUCOPHAGE) 1000 MG tablet Take 1,000 mg by mouth 2 (two) times daily. 05/11/14   [provider]  methocarbamol (ROBAXIN) 750 MG tablet Take 750 mg by mouth every 6 (six) hours as needed for muscle spasms.    [provider]  OZEMPIC, 0.25 OR 0.5 MG/DOSE, 2 MG/1.5ML SOPN Inject 0.25 mg into the skin every Sunday. 01/08/20   [provider]  polyethylene glycol (MIRALAX / GLYCOLAX) 17 g packet Take 17 g by mouth daily as needed for moderate constipation or mild constipation.    [provider]  Potassium Chloride ER 20 MEQ TBCR Take 1 tablet by mouth daily. 04/29/20   [provider]  pravastatin (PRAVACHOL) 20 MG tablet Take 20 mg by mouth in the morning. 11/05/18   [provider]  prazosin (MINIPRESS) 1 MG capsule Take 1 mg by mouth at bedtime as needed (nightmares). 02/25/20   [provider]  RESTASIS 0.05 % ophthalmic emulsion Place 1 drop into both  eyes 2 (two) times a week. 02/26/20   [provider]  tiZANidine (ZANAFLEX) 4 MG tablet Take 4 mg by mouth every 8 (eight) hours as needed for muscle spasms. 01/05/20   [provider]  venlafaxine XR (EFFEXOR-XR) 75 MG 24 hr capsule Take 75 mg by mouth in the morning, at noon, and at bedtime. 02/25/20   [provider]      Allergies    Prednisone, Ace inhibitors, Empagliflozin, Sitagliptin, and Procardia [nifedipine]    Review of Systems   Review of Systems  Unable to perform ROS: Acuity of condition  Respiratory:  Positive for shortness of breath.    Cardiovascular:  Positive for chest pain.    Physical Exam Updated Vital Signs BP (!) 133/100   Pulse (!) 117   Temp (!) 96.1 F (35.6 C) (Axillary)   Resp (!) 22   SpO2 93%  Physical Exam CONSTITUTIONAL: Ill-appearing HEAD: Normocephalic/atraumatic EYES: EOMI/PERRL ENMT: BiPAP mask in place NECK: supple no meningeal signs SPINE/BACK:entire spine nontender CV: S1/S2 noted, tachycardic LUNGS: Tachypnea, crackles bilaterally ABDOMEN: soft, nontender, obese GU:no cva tenderness NEURO: Pt is awake/alert/appropriate, moves all extremitiesx4.  No facial droop.   EXTREMITIES: pulses normal/equalx4, full ROM no lower extremity edema no SKIN: warm, color normal PSYCH: Mildly anxious  ED Results / Procedures / Treatments   Labs (all labs ordered are listed, but only abnormal results are displayed) Labs Reviewed  CBC WITH DIFFERENTIAL/PLATELET - Abnormal; Notable for the following components:      Result Value   RDW 16.1 (*)    Lymphs Abs 6.0 (*)    All other components within normal limits  BASIC METABOLIC PANEL - Abnormal; Notable for the following components:   Glucose, Bld 217 (*)    All other components within normal limits  BRAIN NATRIURETIC PEPTIDE - Abnormal; Notable for the following components:   B Natriuretic Peptide 270.5 (*)    All other components within normal limits  TROPONIN I (HIGH SENSITIVITY) - Abnormal; Notable for the following components:   Troponin I (High Sensitivity) 70 (*)    All other components within normal limits  TROPONIN I (HIGH SENSITIVITY) - Abnormal; Notable for the following components:   Troponin I (High Sensitivity) 83 (*)    All other components within normal limits    EKG EKG Interpretation  Date/Time:  Thursday July 20 2021 00:15:59 EDT Ventricular Rate:  119 PR Interval:  179 QRS Duration: 86 QT Interval:  320 QTC Calculation: 451 R Axis:   60 Text Interpretation: Sinus tachycardia LAE, consider biatrial enlargement LVH  with secondary repolarization abnormality Confirmed by Zadie Rhine ([id number]) on 07/20/2021 12:28:21 AM  Radiology DG Chest Port 1 View  Result Date: 07/20/2021 CLINICAL DATA:  Shortness of breath EXAM: PORTABLE CHEST 1 VIEW COMPARISON:  11/11/2018 FINDINGS: Cardiomegaly with vascular congestion. Interstitial prominence may reflect early interstitial edema. Bibasilar atelectasis. No effusions or acute bony abnormality. IMPRESSION: Cardiomegaly, vascular congestion and possible early interstitial edema. Bibasilar atelectasis. Electronically Signed   By: Charlett Nose M.D.   On: 07/20/2021 01:39    Procedures .Critical Care  Performed by: Zadie Rhine, MD Authorized by: Zadie Rhine, MD   Critical care provider statement:    Critical care time (minutes):  91   Critical care start time:  07/20/2021 12:34 AM   Critical care end time:  07/20/2021 2:05 AM   Critical care time was exclusive of:  Separately billable procedures and treating other patients   Critical care was necessary to treat  or prevent imminent or life-threatening deterioration of the following conditions:  Respiratory failure and cardiac failure   Critical care was time spent personally by me on the following activities:  Development of treatment plan with patient or surrogate, examination of patient, re-evaluation of patient's condition, pulse oximetry, ordering and review of radiographic studies, ordering and review of laboratory studies, ordering and performing treatments and interventions, evaluation of patient's response to treatment, discussions with consultants, obtaining history from patient or surrogate and review of old charts   I assumed direction of critical care for this patient from another provider in my specialty: no     Care discussed with: admitting provider       Medications Ordered in ED Medications  nitroGLYCERIN 50 mg in dextrose 5 % 250 mL (0.2 mg/mL) infusion (0 mcg/min Intravenous Hold 07/20/21 0045)   aspirin chewable tablet 324 mg (has no administration in time range)    ED Course/ Medical Decision Making/ A&P Clinical Course as of 07/20/21 0357  Thu Jul 20, 2021  0039 Patient presented in respiratory distress on noninvasive ventilation.  She is responding to treatment.  High suspicion for acute pulmonary edema.  IV nitroglycerin has been ordered.  Prehospital EKGs were reviewed which reveal sinus tachycardia [DW]  0044 Patient's blood pressure has dropped, but is overall feeling improved, will defer nitroglycerin [DW]  0138 Patient is feeling much improved after BiPAP.  She reports she has been having intermittent chest pressure the past several days while going to the bathroom.  Tonight she woke up with significant shortness of breath and chest pain.  She is now feeling chest pain-free.  We will consult cardiology [DW]  0139 Glucose(!): 217 Hyperglycemia [DW]  0205 Discussed case with on-call cardiology fellow Dr. Elmon Kirschner.  Patient has elevated troponin which could be demand ischemia versus non-STEMI.  Overall patient is clinically improving.  We will repeat troponin and if it is increasing then she will be admitted to cardiology.  Since patient is improving, we will trial her off BiPAP [DW]  0237 Patient tolerating off of BiPAP.  Repeat troponin is pending.    [DW]  [phone] Patient resting comfortably, no new chest pain.  Vital signs have  stabilized.  She will be admitted [DW]  0357 Discussed with Dr. Antionette Char for admission [DW]    Clinical Course User Index [DW] Zadie Rhine, MD                           Medical Decision Making Amount and/or Complexity of Data Reviewed Labs: ordered. Decision-making details documented in ED Course. Radiology: ordered.  Risk OTC drugs. Prescription drug management. Decision regarding hospitalization.   This patient presents to the ED for concern of shortness of breath, this involves an extensive number of treatment options, and is a  complaint that carries with it a high risk of complications and morbidity.  The differential diagnosis includes but is not limited to Acute coronary syndrome, pneumonia, acute pulmonary edema, pneumothorax, acute anemia, pulmonary embolism  Comorbidities that complicate the patient evaluation: Patient's presentation is complicated by their history of CAD and obesity, hypertension   Additional history obtained: Records reviewed previous admission documents Discussed the case with patient's son at bedside  Lab Tests: I Ordered, and personally interpreted labs.  The pertinent results include: Hyperglycemia, elevated BNP indicative of CHF  Imaging Studies ordered: I ordered imaging studies including X-ray chest   I independently visualized and interpreted imaging which showed acute  pulmonary edema I agree with the radiologist interpretation  Cardiac Monitoring: The patient was maintained on a cardiac monitor.  I personally viewed and interpreted the cardiac monitor which showed an underlying rhythm of:  sinus tachycardia  Medicines ordered and prescription drug management: Patient had been given nitroglycerin by EMS, and blood pressure improved We will defer Lasix due to soft blood pressures We will give aspirin for possible acute coronary syndrome  Critical Interventions:  Noninvasive ventilation  Consultations Obtained: I requested consultation with the admitting physician Dr. Antionette Char and consultant Dr Brayton Layman , and discussed  findings as well as pertinent plan - they recommend: admit, defer cardiac cath for now  Reevaluation: After the interventions noted above, I reevaluated the patient and found that they have :improved  Complexity of problems addressed: Patient's presentation is most consistent with  acute presentation with potential threat to life or bodily function  Disposition: After consideration of the diagnostic results and the patient's response to treatment,  I feel  that the patent would benefit from admission   .           Final Clinical Impression(s) / ED Diagnoses Final diagnoses:  Acute respiratory failure with hypoxia (HCC)  Acute pulmonary edema [facility])    Rx / DC Orders ED Discharge Orders     None         Zadie Rhine, MD 07/20/21 [phone]

## 2021-07-20 NOTE — H&P (Signed)
History and Physical    Stacey Fletcher WUJ:[id number] DOB: 11/30/1950 DOA: 07/20/2021  PCP: Soundra Pilon, FNP   Patient coming from: Home   Chief Complaint: SOB, chest discomfort   HPI: Stacey Fletcher is a pleasant 71 y.o. female with medical history significant for hypertension, type 2 diabetes mellitus, depression, fibromyalgia, and remote history of heart catheterization without stents, now presenting to the emergency department with shortness of breath and chest discomfort.  Patient reports going to bed last night in her usual state but woke early this morning with shortness of breath and central, nonradiating chest discomfort.  EMS was called and found her BP to be 220/130 with oxygen saturation of 65% on room air.  She was given 3 doses of nitroglycerin, started on CPAP, and brought into the ED.  She denies recent cough, change in her chronic mild leg swelling, fever, chills, or change in medications.  ED Course: Upon arrival to the ED, patient is found to be afebrile, saturating well on BiPAP, slightly tachypneic and tachycardic, and with SBP 80s-130.  EKG features sinus tachycardia with rate 119 and LVH with repolarization abnormality.  Chest x-ray notable for cardiomegaly, vascular congestion, and possible early interstitial edema.  Chemistry panel notable for glucose 217.  Troponin was 70 and then 83.  BNP is elevated to 271.  Nitroglycerin infusion was ordered but never started due to low pressures.  She was transitioned from BiPAP to nasal cannula oxygen.  ED physician discussed case with cardiology and hospitalists were asked to admit.  Review of Systems:  All other systems reviewed and apart from HPI, are negative.  Past Medical History:  Diagnosis Date   Coronary artery disease    Depression    Diabetes mellitus    Fibromyalgia    Goiter    Hypertension    MI (myocardial infarction) (HCC)    Osteoarthritis    Pneumonia    10+ years ago.    Past Surgical History:   Procedure Laterality Date   ABDOMINAL HYSTERECTOMY     BREAST SURGERY     CARDIAC CATHETERIZATION     1995    Social History:   reports that she quit smoking about 3 years ago. She has never used smokeless tobacco. She reports that she does not drink alcohol and does not use drugs.  Allergies  Allergen Reactions   Prednisone Shortness Of Breath and Rash   Ace Inhibitors Other (See Comments)    Dizzy headaches, crawling feeling inside of arm   Empagliflozin Other (See Comments)    uti/yeast infection   Sitagliptin Itching   Procardia [Nifedipine] Palpitations    "heart attack symptoms"    Family History  Problem Relation Age of Onset   Hypertension Mother    Diabetes Mother    Stroke Mother    Hypertension Other    Diabetes Other      Prior to Admission medications   Medication Sig Start Date End Date Taking? Authorizing Provider  amLODipine (NORVASC) 10 MG tablet Take 10 mg by mouth in the morning. 06/03/11   Azalia Bilis, MD  aspirin 81 MG chewable tablet Chew 81 mg by mouth in the morning.    [provider]  atenolol-chlorthalidone (TENORETIC) 50-25 MG per tablet Take 1 tablet by mouth in the morning. 04/06/14   [provider]  buPROPion (WELLBUTRIN XL) 300 MG 24 hr tablet Take 300 mg by mouth every morning. 01/25/20   [provider]  busPIRone (BUSPAR) 5 MG tablet Take 5  mg by mouth 3 (three) times daily. 04/29/20   [provider]  etodolac (LODINE) 500 MG tablet Take 500 mg by mouth 2 (two) times daily as needed for pain. 09/12/18   [provider]  gabapentin (NEURONTIN) 100 MG capsule TAKE 1 CAPSULE(100 MG) BY MOUTH AT BEDTIME 04/24/21   Kerrin Champagne, MD  glipiZIDE (GLUCOTROL XL) 10 MG 24 hr tablet Take 10 mg by mouth 2 (two) times daily. Lunch & supper 02/29/20   [provider]  levothyroxine (SYNTHROID) 25 MCG tablet Take 25 mcg by mouth daily before breakfast. 12/26/19   [provider]  metFORMIN  (GLUCOPHAGE) 1000 MG tablet Take 1,000 mg by mouth 2 (two) times daily. 05/11/14   [provider]  methocarbamol (ROBAXIN) 750 MG tablet Take 750 mg by mouth every 6 (six) hours as needed for muscle spasms.    [provider]  OZEMPIC, 0.25 OR 0.5 MG/DOSE, 2 MG/1.5ML SOPN Inject 0.25 mg into the skin every Sunday. 01/08/20   [provider]  polyethylene glycol (MIRALAX / GLYCOLAX) 17 g packet Take 17 g by mouth daily as needed for moderate constipation or mild constipation.    [provider]  Potassium Chloride ER 20 MEQ TBCR Take 1 tablet by mouth daily. 04/29/20   [provider]  pravastatin (PRAVACHOL) 20 MG tablet Take 20 mg by mouth in the morning. 11/05/18   [provider]  prazosin (MINIPRESS) 1 MG capsule Take 1 mg by mouth at bedtime as needed (nightmares). 02/25/20   [provider]  RESTASIS 0.05 % ophthalmic emulsion Place 1 drop into both eyes 2 (two) times a week. 02/26/20   [provider]  tiZANidine (ZANAFLEX) 4 MG tablet Take 4 mg by mouth every 8 (eight) hours as needed for muscle spasms. 01/05/20   [provider]  venlafaxine XR (EFFEXOR-XR) 75 MG 24 hr capsule Take 75 mg by mouth in the morning, at noon, and at bedtime. 02/25/20   [provider]    Physical Exam: Vitals:   07/20/21 0300 07/20/21 0315 07/20/21 0330 07/20/21 0345  BP: 95/62 103/75 105/61 (!) 96/59  Pulse: 88 93 93 94  Resp: 15 13 13 13   Temp:      TempSrc:      SpO2: 98% 97% 97% 98%    Constitutional: NAD, calm  Eyes: PERTLA, lids and conjunctivae normal ENMT: Mucous membranes are moist. Posterior pharynx clear of any exudate or lesions.   Neck: supple, no masses  Respiratory: no wheezing, no cough. No accessory muscle use.  Cardiovascular: S1 & S2 heard, regular rate and rhythm. No extremity edema.   Abdomen: No distension, no tenderness, soft. Bowel sounds active.  Musculoskeletal: no clubbing / cyanosis. No  joint deformity upper and lower extremities.   Skin: no significant rashes, lesions, ulcers. Warm, dry, well-perfused. Neurologic: CN 2-12 grossly intact. Sensation intact. Moving all extremities. Alert and oriented.  Psychiatric: Very pleasant. Cooperative.    Labs and Imaging on Admission: I have personally reviewed following labs and imaging studies  CBC: Recent Labs  Lab 07/20/21 0024  WBC 10.3  NEUTROABS 3.2  HGB 12.8  HCT 41.4  MCV 84.1  PLT 277   Basic Metabolic Panel: Recent Labs  Lab 07/20/21 0024  NA 141  K 3.5  CL 102  CO2 25  GLUCOSE 217*  BUN 12  CREATININE 0.97  CALCIUM 8.9   GFR: CrCl cannot be calculated (Unknown ideal weight.). Liver Function Tests: No results for  input(s): "AST", "ALT", "ALKPHOS", "BILITOT", "PROT", "ALBUMIN" in the last 168 hours. No results for input(s): "LIPASE", "AMYLASE" in the last 168 hours. No results for input(s): "AMMONIA" in the last 168 hours. Coagulation Profile: No results for input(s): "INR", "PROTIME" in the last 168 hours. Cardiac Enzymes: No results for input(s): "CKTOTAL", "CKMB", "CKMBINDEX", "TROPONINI" in the last 168 hours. BNP (last 3 results) No results for input(s): "PROBNP" in the last 8760 hours. HbA1C: No results for input(s): "HGBA1C" in the last 72 hours. CBG: No results for input(s): "GLUCAP" in the last 168 hours. Lipid Profile: No results for input(s): "CHOL", "HDL", "LDLCALC", "TRIG", "CHOLHDL", "LDLDIRECT" in the last 72 hours. Thyroid Function Tests: No results for input(s): "TSH", "T4TOTAL", "FREET4", "T3FREE", "THYROIDAB" in the last 72 hours. Anemia Panel: No results for input(s): "VITAMINB12", "FOLATE", "FERRITIN", "TIBC", "IRON", "RETICCTPCT" in the last 72 hours. Urine analysis:    Component Value Date/Time   COLORURINE YELLOW 05/02/2020 1039   APPEARANCEUR CLEAR 05/02/2020 1039   LABSPEC 1.024 05/02/2020 1039   PHURINE 5.0 05/02/2020 1039   GLUCOSEU NEGATIVE 05/02/2020 1039    HGBUR NEGATIVE 05/02/2020 1039   BILIRUBINUR SMALL (A) 05/02/2020 1039   KETONESUR NEGATIVE 05/02/2020 1039   PROTEINUR NEGATIVE 05/02/2020 1039   UROBILINOGEN 0.2 05/06/2013 1740   NITRITE NEGATIVE 05/02/2020 1039   LEUKOCYTESUR NEGATIVE 05/02/2020 1039   Sepsis Labs: @LABRCNTIP (procalcitonin:4,lacticidven:4) )No results found for this or any previous visit (from the past 240 hour(s)).   Radiological Exams on Admission: DG Chest Port 1 View  Result Date: 07/20/2021 CLINICAL DATA:  Shortness of breath EXAM: PORTABLE CHEST 1 VIEW COMPARISON:  11/11/2018 FINDINGS: Cardiomegaly with vascular congestion. Interstitial prominence may reflect early interstitial edema. Bibasilar atelectasis. No effusions or acute bony abnormality. IMPRESSION: Cardiomegaly, vascular congestion and possible early interstitial edema. Bibasilar atelectasis. Electronically Signed   By: Charlett Nose M.D.   On: 07/20/2021 01:39    EKG: Independently reviewed. Sinus tachycardia, rate 119, LVH with repolarization abnormality.   Assessment/Plan   1. Acute CHF; acute hypoxic respiratory failure  - Pt woke with acute respiratory distress and chest discomfort, was found by EMS to have BP 220/130 and O2 sat 65%, and was given 3 doses NTG and started on CPAP pta  - BP normalized with NTG, chest discomfort resolved, and respiratory status improved dramatically  - BNP is elevated to 271 and acute CHF findings noted on CXR  - Scenario most suggestive of flash pulmonary edema, less likely non-STEMI  - She continues to require supplemental O2 in ED, plan to diurese with IV Lasix, continue BP-control, trend troponin, check echocardiogram    2. Elevated troponin  - HS troponin was 70 then 83 two hrs later in ED  - She had chest discomfort when she woke with SOB, but none since arrival in ED  - She had remote stress test and cath, denies hx of stents, and records not available  - Suspect this is demand ischemia in setting of acute  respiratory distress and severely elevated BP  - Trend troponin, check echocardiogram, continue ASA, statin, and beta-blocker   3. Hypertension  - BP was 220/130 initially with EMS, was treated with 3 doses NTG pta and has had SBP in 80s-130 since  - Continue Norvasc and atenolol as BP allows    4. Type II DM  - Check CBGs and use low-intensity SSI for now    5. Depression; PTSD   - Continue Wellbutrin, Effexor, Minipress    DVT prophylaxis: Lovenox  Code  Status: Full  Level of Care: Level of care: Progressive Family Communication: son updated at bedside  Disposition Plan:  Patient is from: home  Anticipated d/c is to: Home  Anticipated d/c date is: 07/22/21  Patient currently: Pending repeat troponin, echocardiogram, improved respiratory status, stable BP  Consults called: ED discussed with cardiology  Admission status: Inpatient     Briscoe Deutscher, MD [facility]  07/20/2021, 4:34 AM

## 2021-07-20 NOTE — Progress Notes (Addendum)
Heart Failure Stewardship Pharmacist Progress Note   PCP: Soundra Pilon, FNP PCP-Cardiologist: Lance Muss, MD    HPI:   71 y.o. female with PMH of hypertension, type 2 diabetes, depression, fibromyalgia, hypothyroidism, chronic back pain, former tobacco use, who presented on 07/20/2021 with CP  She was initially seen by Dr. Eden Emms 08/20/2014 with chest pain, that was atypical, had negative EKG and troponin.  Recommended outpatient Myoview for ischemic evaluation but never followed up.  She was lost to follow-up since.    She was last seen by Dr. Eldridge Dace 06/28/2020 as a new patient for preop evaluation for lumbar fusion. Reportedly, she watched her son being murdered in [place] in 1990 where she had chest pain required hospitalization. She had a cardiac cath, no stent was placed.  She is able to tolerate mowing the lawns besides some knee pain.  She had no anginal symptoms and no further cardiac testing was recommended before operation.   Patient presented to the ED on 07/20/2021 complaining sudden onset of shortness of breath and chest pain.  She woke up with above symptoms.  She was in normal state of health last night.  EMS was called, she was found to be hypoxic with pulse ox 60% on room air and bilateral crackles.  She was also noted hypertensive with blood pressure 220/130 at field.  She required BiPAP support, was given 3 doses of sublingual nitroglycerin.   Admission diagnostic revealed hyperglycemia 217, otherwise unremarkable CBC differential and BMP.  BNP elevated to 70.  High sensitive troponin 70 >83 >93.  Chest x-ray revealed cardiomegaly with vascular congestion and possible early interstitial edema, bibasilar atelectasis. EKG reveals sinus tachycardia with ventricular rate of 119 bpm, non-specific ST-T abnormalities.  She was concern for acute heart failure/flash pulmonary edema, admitted to hospital medicine for acute hypoxic respiratory failure, started on IV Lasix,  cardiology is consulted for further input.   Upon encounter, she states she had sudden onset of SOB and chest pain last night. Chest was extremely uncomfortable, non-radiating. She states her BP is normal 120/80s and takes her BP meds daily. Her regular activity is limited due to knee pain from osteoarthritis. She states her SOB and chest pain had resolved. She had urinated a lot. She is feeling well now. She denied any dizziness, syncope, nausea, vomiting, leg edema, orthopnea.   Echo with LVEF 30-35%, mild LVH, GIIDD, RV midly reduced, mild MR. Coronary CT are scheduled for 07/21/2021.   Current HF Medications: Diuretic: furosemide 40 mg IV BID Beta Blocker: none ACE/ARB/ARNI: none Aldosterone Antagonist: none SGLT2i: none Other: Kcl 20 mEq daily   Prior to admission HF Medications: None  Pertinent Lab Values: Serum creatinine 0.95, BUN 14, Potassium 3.4, Sodium 139, BNP 270.5, Magnesium 1.9, A1c 6.7%  Vital Signs: Weight: none Blood pressure: 110-130/60-80  Heart rate: 85-100  I/O: none  Medication Assistance / Insurance Benefits Check: Does the patient have prescription insurance?  Yes Type of insurance plan: AARP Medicare Rx  Outpatient Pharmacy:  Prior to admission outpatient pharmacy: [facility] Is the patient willing to use [facility] TOC pharmacy at discharge? Yes Is the patient willing to transition their outpatient pharmacy to utilize a [facility] outpatient pharmacy?   Pending  Assessment: 1. Acute CHF (LVEF 30-35%), due to unclear etiology. NYHA class II symptoms. -A1c 6.7% would benefit from addition of SGLT2i regardless of EF as has DM -BP now controlled, BP on admit elevated, would be a good Entresto candidate and spironolactone to help control BP.  Decrease amlodipine before titration off.  -EF reduced, switch atenolol 50 mg daily to equivalent dose carvedilol 12.5 mg BID for mortality benefit.  -Further medication changes pending echo and CTA results   Plan: 1)  Medication changes recommended at this time: -Start Farxiga 10 mg daily  -Decrease amlodipine to 5 mg daily  -Stop atenolol 50 mg daily and start carvedilol 12.5 mg BID -Further medication changes pending CTA results  2) Patient assistance: -copay $0 for Entresto/Jardiance/Farxiga  3)  Education  - To be completed prior to discharge  Drake Leach, PharmD, BCPS PGY2 Cardiology Pharmacy Resident

## 2021-07-20 NOTE — ED Triage Notes (Addendum)
Pt comes via [facility] from home, woke up from her sleep with resp distress, hx of COPD, and CHF, upon fire arrival stats were in the 60's with rales in all fields, PTA received 3 nitro

## 2021-07-20 NOTE — Progress Notes (Signed)
Echocardiogram 2D Echocardiogram has been performed.  Warren Lacy Jajuan Skoog RDCS 07/20/2021, 1:58 PM

## 2021-07-20 NOTE — Progress Notes (Signed)
Pt pending cardiac CTA today. 15mg  ivabradine given to lower HR for scan. BP trending down after antihypertensives and diuretics. Cardiology suggested we postpone test to tomorrow when BP comes up after medication changes.   Goals of the cardiac CTA: NPO 4 hr prior to scan (water/ice okay) No caffeine to aid in HR control HR goal 55-65bpm (will re-order ivabradine for the AM) Maintain 18g IV in the [facility] for contrast  Will coordinate scan time with CT techs  [facility] RN Navigator Cardiac Imaging [facility] [phone] Office  [phone] Cell

## 2021-07-20 NOTE — ED Notes (Signed)
Taken off bipap, on 4L

## 2021-07-21 ENCOUNTER — Encounter (HOSPITAL_COMMUNITY)
Admission: EM | Disposition: A | Discharge status: Home / Self Care | Payer: Self-pay | Source: Home / Self Care | Attending: Internal Medicine

## 2021-07-21 ENCOUNTER — Encounter (HOSPITAL_COMMUNITY): Payer: Self-pay | Admitting: Family Medicine

## 2021-07-21 DIAGNOSIS — R778 Other specified abnormalities of plasma proteins: Secondary | ICD-10-CM | POA: Diagnosis not present

## 2021-07-21 DIAGNOSIS — J9601 Acute respiratory failure with hypoxia: Secondary | ICD-10-CM | POA: Diagnosis not present

## 2021-07-21 DIAGNOSIS — I5041 Acute combined systolic (congestive) and diastolic (congestive) heart failure: Secondary | ICD-10-CM | POA: Diagnosis not present

## 2021-07-21 DIAGNOSIS — I10 Essential (primary) hypertension: Secondary | ICD-10-CM | POA: Diagnosis not present

## 2021-07-21 HISTORY — PX: RIGHT/LEFT HEART CATH AND CORONARY ANGIOGRAPHY: CATH118266

## 2021-07-21 LAB — POCT I-STAT 7, (LYTES, BLD GAS, ICA,H+H)
Acid-Base Excess: 3 mmol/L — ABNORMAL HIGH (ref 0.0–2.0)
Bicarbonate: 27.4 mmol/L (ref 20.0–28.0)
Calcium, Ion: 1.27 mmol/L (ref 1.15–1.40)
HCT: 36 % (ref 36.0–46.0)
Hemoglobin: 12.2 g/dL (ref 12.0–15.0)
O2 Saturation: 96 %
Potassium: 3.6 mmol/L (ref 3.5–5.1)
Sodium: 141 mmol/L (ref 135–145)
TCO2: 29 mmol/L (ref 22–32)
pCO2 arterial: 41.6 mmHg (ref 32–48)
pH, Arterial: 7.427 (ref 7.35–7.45)
pO2, Arterial: 78 mmHg — ABNORMAL LOW (ref 83–108)

## 2021-07-21 LAB — CBC
HCT: 33.4 % — ABNORMAL LOW (ref 36.0–46.0)
Hemoglobin: 10.2 g/dL — ABNORMAL LOW (ref 12.0–15.0)
MCH: 24.8 pg — ABNORMAL LOW (ref 26.0–34.0)
MCHC: 30.5 g/dL (ref 30.0–36.0)
MCV: 81.1 fL (ref 80.0–100.0)
Platelets: 227 10*3/uL (ref 150–400)
RBC: 4.12 MIL/uL (ref 3.87–5.11)
RDW: 16.1 % — ABNORMAL HIGH (ref 11.5–15.5)
WBC: 8.1 10*3/uL (ref 4.0–10.5)
nRBC: 0 % (ref 0.0–0.2)

## 2021-07-21 LAB — BASIC METABOLIC PANEL
Anion gap: 10 (ref 5–15)
BUN: 16 mg/dL (ref 8–23)
CO2: 25 mmol/L (ref 22–32)
Calcium: 8.9 mg/dL (ref 8.9–10.3)
Chloride: 104 mmol/L (ref 98–111)
Creatinine, Ser: 1.09 mg/dL — ABNORMAL HIGH (ref 0.44–1.00)
GFR, Estimated: 54 mL/min — ABNORMAL LOW (ref >60)
Glucose, Bld: 154 mg/dL — ABNORMAL HIGH (ref 70–99)
Potassium: 3.2 mmol/L — ABNORMAL LOW (ref 3.5–5.1)
Sodium: 139 mmol/L (ref 135–145)

## 2021-07-21 LAB — POCT I-STAT EG7
Acid-Base Excess: 3 mmol/L — ABNORMAL HIGH (ref 0.0–2.0)
Acid-Base Excess: 4 mmol/L — ABNORMAL HIGH (ref 0.0–2.0)
Bicarbonate: 28.5 mmol/L — ABNORMAL HIGH (ref 20.0–28.0)
Bicarbonate: 29.3 mmol/L — ABNORMAL HIGH (ref 20.0–28.0)
Calcium, Ion: 1.27 mmol/L (ref 1.15–1.40)
Calcium, Ion: 1.28 mmol/L (ref 1.15–1.40)
HCT: 35 % — ABNORMAL LOW (ref 36.0–46.0)
HCT: 35 % — ABNORMAL LOW (ref 36.0–46.0)
Hemoglobin: 11.9 g/dL — ABNORMAL LOW (ref 12.0–15.0)
Hemoglobin: 11.9 g/dL — ABNORMAL LOW (ref 12.0–15.0)
O2 Saturation: 56 %
O2 Saturation: 58 %
Potassium: 3.6 mmol/L (ref 3.5–5.1)
Potassium: 3.7 mmol/L (ref 3.5–5.1)
Sodium: 141 mmol/L (ref 135–145)
Sodium: 141 mmol/L (ref 135–145)
TCO2: 30 mmol/L (ref 22–32)
TCO2: 31 mmol/L (ref 22–32)
pCO2, Ven: 44.7 mmHg (ref 44–60)
pCO2, Ven: 45.3 mmHg (ref 44–60)
pH, Ven: 7.413 (ref 7.25–7.43)
pH, Ven: 7.418 (ref 7.25–7.43)
pO2, Ven: 29 mmHg — CL (ref 32–45)
pO2, Ven: 30 mmHg — CL (ref 32–45)

## 2021-07-21 LAB — GLUCOSE, CAPILLARY
Glucose-Capillary: 148 mg/dL — ABNORMAL HIGH (ref 70–99)
Glucose-Capillary: 139 mg/dL — ABNORMAL HIGH (ref 70–99)
Glucose-Capillary: 131 mg/dL — ABNORMAL HIGH (ref 70–99)
Glucose-Capillary: 107 mg/dL — ABNORMAL HIGH (ref 70–99)
Glucose-Capillary: 115 mg/dL — ABNORMAL HIGH (ref 70–99)

## 2021-07-21 SURGERY — RIGHT/LEFT HEART CATH AND CORONARY ANGIOGRAPHY
Anesthesia: LOCAL

## 2021-07-21 MED ORDER — SODIUM CHLORIDE 0.9 % IV SOLN: INTRAVENOUS | Status: AC

## 2021-07-21 MED ORDER — IOHEXOL 350 MG/ML SOLN
INTRAVENOUS | Status: DC | PRN
  Administered 2021-07-21: 45 mL

## 2021-07-21 MED ORDER — ACETAMINOPHEN 325 MG PO TABS
650.0000 mg | ORAL_TABLET | ORAL | Status: DC | PRN
  Administered 2021-07-22: 650 mg via ORAL
  Filled 2021-07-21: qty 2

## 2021-07-21 MED ORDER — MIDAZOLAM HCL 2 MG/2ML IJ SOLN
INTRAMUSCULAR | Status: AC
  Filled 2021-07-21: qty 2

## 2021-07-21 MED ORDER — SPIRONOLACTONE 12.5 MG HALF TABLET
12.5000 mg | ORAL_TABLET | Freq: Every day | ORAL | Status: DC
  Administered 2021-07-21 – 2021-07-22 (×2): 12.5 mg via ORAL
  Filled 2021-07-21 (×2): qty 1

## 2021-07-21 MED ORDER — POTASSIUM CHLORIDE CRYS ER 20 MEQ PO TBCR
40.0000 meq | EXTENDED_RELEASE_TABLET | Freq: Once | ORAL | Status: AC
  Administered 2021-07-21: 40 meq via ORAL
  Filled 2021-07-21: qty 2

## 2021-07-21 MED ORDER — VERAPAMIL HCL 2.5 MG/ML IV SOLN
INTRA_ARTERIAL | Status: DC | PRN
  Administered 2021-07-21: 15 mL via INTRA_ARTERIAL

## 2021-07-21 MED ORDER — LIDOCAINE HCL (PF) 1 % IJ SOLN
INTRAMUSCULAR | Status: AC
  Filled 2021-07-21: qty 30

## 2021-07-21 MED ORDER — MORPHINE SULFATE (PF) 2 MG/ML IV SOLN: 2.0000 mg | INTRAVENOUS | Status: DC | PRN

## 2021-07-21 MED ORDER — HYDRALAZINE HCL 20 MG/ML IJ SOLN: 10.0000 mg | INTRAMUSCULAR | Status: AC | PRN

## 2021-07-21 MED ORDER — FENTANYL CITRATE (PF) 100 MCG/2ML IJ SOLN
INTRAMUSCULAR | Status: DC | PRN
Start: 2021-07-21 — End: 2021-07-21
  Administered 2021-07-21: 25 ug via INTRAVENOUS

## 2021-07-21 MED ORDER — NITROGLYCERIN 1 MG/10 ML FOR IR/CATH LAB
INTRA_ARTERIAL | Status: AC
  Filled 2021-07-21: qty 10

## 2021-07-21 MED ORDER — FENTANYL CITRATE (PF) 100 MCG/2ML IJ SOLN
INTRAMUSCULAR | Status: AC
  Filled 2021-07-21: qty 2

## 2021-07-21 MED ORDER — HEPARIN (PORCINE) IN NACL 1000-0.9 UT/500ML-% IV SOLN
INTRAVENOUS | Status: DC | PRN
  Administered 2021-07-21 (×2): 500 mL

## 2021-07-21 MED ORDER — LIDOCAINE HCL (PF) 1 % IJ SOLN
INTRAMUSCULAR | Status: DC | PRN
  Administered 2021-07-21 (×2): 2 mL

## 2021-07-21 MED ORDER — SODIUM CHLORIDE 0.9 % IV SOLN: 250.0000 mL | INTRAVENOUS | Status: DC | PRN

## 2021-07-21 MED ORDER — FUROSEMIDE 10 MG/ML IJ SOLN
40.0000 mg | Freq: Two times a day (BID) | INTRAMUSCULAR | Status: DC
  Administered 2021-07-21 – 2021-07-22 (×3): 40 mg via INTRAVENOUS
  Filled 2021-07-21 (×3): qty 4

## 2021-07-21 MED ORDER — SODIUM CHLORIDE 0.9% FLUSH: 3.0000 mL | INTRAVENOUS | Status: DC | PRN

## 2021-07-21 MED ORDER — HEPARIN SODIUM (PORCINE) 1000 UNIT/ML IJ SOLN
INTRAMUSCULAR | Status: DC | PRN
  Administered 2021-07-21: 5000 [IU] via INTRAVENOUS

## 2021-07-21 MED ORDER — HEPARIN (PORCINE) IN NACL 1000-0.9 UT/500ML-% IV SOLN
INTRAVENOUS | Status: AC
  Filled 2021-07-21: qty 500

## 2021-07-21 MED ORDER — LABETALOL HCL 5 MG/ML IV SOLN: 10.0000 mg | INTRAVENOUS | Status: AC | PRN

## 2021-07-21 MED ORDER — VERAPAMIL HCL 2.5 MG/ML IV SOLN
INTRAVENOUS | Status: AC
  Filled 2021-07-21: qty 2

## 2021-07-21 MED ORDER — HEPARIN SODIUM (PORCINE) 1000 UNIT/ML IJ SOLN
INTRAMUSCULAR | Status: AC
  Filled 2021-07-21: qty 10

## 2021-07-21 MED ORDER — MIDAZOLAM HCL 2 MG/2ML IJ SOLN
INTRAMUSCULAR | Status: DC | PRN
  Administered 2021-07-21: 1 mg via INTRAVENOUS

## 2021-07-21 MED ORDER — SODIUM CHLORIDE 0.9% FLUSH
3.0000 mL | Freq: Two times a day (BID) | INTRAVENOUS | Status: DC
  Administered 2021-07-22 – 2021-07-23 (×3): 3 mL via INTRAVENOUS

## 2021-07-21 MED ORDER — ONDANSETRON HCL 4 MG/2ML IJ SOLN: 4.0000 mg | Freq: Four times a day (QID) | INTRAMUSCULAR | Status: DC | PRN

## 2021-07-21 SURGICAL SUPPLY — 15 items
BAND CMPR LRG ZPHR (HEMOSTASIS) ×1
BAND ZEPHYR COMPRESS 30 LONG (HEMOSTASIS) ×1 IMPLANT
CATH BALLN WEDGE 5F 110CM (CATHETERS) ×1 IMPLANT
CATH INFINITI JR4 5F (CATHETERS) ×1 IMPLANT
CATH OPTITORQUE TIG 4.0 5F (CATHETERS) ×1 IMPLANT
GLIDESHEATH SLEND A-KIT 6F 22G (SHEATH) ×1 IMPLANT
GUIDEWIRE INQWIRE 1.5J.035X260 (WIRE) IMPLANT
INQWIRE 1.5J .035X260CM (WIRE) ×2
KIT HEART LEFT (KITS) ×3 IMPLANT
PACK CARDIAC CATHETERIZATION (CUSTOM PROCEDURE TRAY) ×3 IMPLANT
SHEATH GLIDE SLENDER 4/5FR (SHEATH) ×1 IMPLANT
SHEATH PROBE COVER 6X72 (BAG) ×1 IMPLANT
TRANSDUCER W/STOPCOCK (MISCELLANEOUS) ×3 IMPLANT
TUBING CIL FLEX 10 FLL-RA (TUBING) ×3 IMPLANT
WIRE HI TORQ VERSACORE-J 145CM (WIRE) ×1 IMPLANT

## 2021-07-21 NOTE — Progress Notes (Signed)
Pt returned from Cath Lab.  Pt is A&O X4 and neuro intact.  Radial and brachial sites are clean dry intact and level 0.  Vitals taken and within normal range.  Pt is currently comfortable and not ion pain.    07/21/21 1501  Vitals  Temp 98.2 F (36.8 C)  Temp Source Oral  BP 112/78  MAP (mmHg) 85  BP Location Left Arm  BP Method Automatic  Patient Position (if appropriate) Lying  Pulse Rate 92  Pulse Rate Source Monitor  ECG Heart Rate 93  Resp 15  Level of Consciousness  Level of Consciousness Alert  Oxygen Therapy  SpO2 95 %  O2 Device Room Air  Pain Assessment  Pain Scale 0-10  Pain Score 0  POSS Scale (Pasero Opioid Sedation Scale)  POSS *See Group Information* 1-Acceptable,Awake and alert  Glasgow Coma Scale  Eye Opening 4  Best Verbal Response (NON-intubated) 5  Best Motor Response 6  Glasgow Coma Scale Score 15  MEWS Score  MEWS Temp 0  MEWS Systolic 0  MEWS Pulse 0  MEWS RR 0  MEWS LOC 0  MEWS Score 0  MEWS Score Color Green

## 2021-07-21 NOTE — H&P (View-Only) (Signed)
Progress Note  Patient Name: [name] Date of Encounter: 07/21/2021  Primary Cardiologist: Lance Muss, MD   Subjective   Overnight no events. Patient notes that she feels ok.  Is worried about dying during cath No CP, SOB, Palpitations.  Inpatient Medications    Scheduled Meds:  aspirin  81 mg Oral Daily   buPROPion  300 mg Oral q morning   enoxaparin (LOVENOX) injection  40 mg Subcutaneous Daily   gabapentin  100 mg Oral QHS   insulin aspart  0-5 Units Subcutaneous QHS   insulin aspart  0-6 Units Subcutaneous TID WC   levothyroxine  25 mcg Oral QAC breakfast   losartan  25 mg Oral Daily   potassium chloride SA  20 mEq Oral Daily   pravastatin  20 mg Oral q AM   sodium chloride flush  3 mL Intravenous Q12H   sodium chloride flush  3 mL Intravenous Q12H   venlafaxine XR  75 mg Oral Q breakfast   Continuous Infusions:  sodium chloride     sodium chloride 10 mL/hr at 07/21/21 0711   PRN Meds: sodium chloride, acetaminophen **OR** acetaminophen, ondansetron **OR** ondansetron (ZOFRAN) IV, senna-docusate, sodium chloride flush   Vital Signs    Vitals:   07/20/21 2006 07/20/21 2313 07/21/21 0419 07/21/21 0833  BP: 102/66 107/67 122/70 117/76  Pulse: 85 76 92 88  Resp: 20 14 17 18   Temp: 98.8 F (37.1 C) 98.3 F (36.8 C) 98.4 F (36.9 C) 98.3 F (36.8 C)  TempSrc: Oral Oral Oral Oral  SpO2: 100% 100% 95% 100%  Weight:   107.3 kg   Height:        Intake/Output Summary (Last 24 hours) at 07/21/2021 1022 Last data filed at 07/21/2021 5284 Gross per 24 hour  Intake 474 ml  Output 200 ml  Net 274 ml   Filed Weights   07/20/21 1714 07/21/21 0419  Weight: 107.1 kg 107.3 kg    Telemetry    SR - Personally Reviewed  Physical Exam   Gen: No distress Neck: No JVD,  Cardiac: No Rubs or Gallops, soft systolic Murmur, RRR +2 radial pulses Respiratory: Clear to auscultation bilaterally, normal effort, normal  respiratory rate GI: Soft,  nontender, non-distended  MS: No  edema;  moves all extremities Integument: Skin feels cool not cold Neuro:  At time of evaluation, alert and oriented to person/place/time/situation  Psych: Normal affect, patient feels well   Labs    Chemistry Recent Labs  Lab 07/20/21 0024 07/20/21 0457 07/21/21 0207  NA 141 139 139  K 3.5 3.4* 3.2*  CL 102 102 104  CO2 25 26 25   GLUCOSE 217* 136* 154*  BUN 12 14 16   CREATININE 0.97 0.95 1.09*  CALCIUM 8.9 8.6* 8.9  GFRNONAA >60 >60 54*  ANIONGAP 14 11 10      Hematology Recent Labs  Lab 07/20/21 0024 07/20/21 0457 07/21/21 0207  WBC 10.3 8.7 8.1  RBC 4.92 4.24 4.12  HGB 12.8 10.7* 10.2*  HCT 41.4 35.0* 33.4*  MCV 84.1 82.5 81.1  MCH 26.0 25.2* 24.8*  MCHC 30.9 30.6 30.5  RDW 16.1* 16.3* 16.1*  PLT 277 236 227    Cardiac EnzymesNo results for input(s): "TROPONINI" in the last 168 hours. No results for input(s): "TROPIPOC" in the last 168 hours.   BNP Recent Labs  Lab 07/20/21 0024  BNP 270.5*     DDimer No results for input(s): "DDIMER" in the last 168 hours.   Radiology  ECHOCARDIOGRAM COMPLETE  Result Date: 07/20/2021    ECHOCARDIOGRAM REPORT   Patient Name:   SHAKELA DAQUINO Date of Exam: 07/20/2021 Medical Rec #:  [id number]        Height:       68.0 in Accession #:    [id number]       Weight:       238.0 lb Date of Birth:  06/13/1950        BSA:          2.201 m Patient Age:    70 years         BP:           120/75 mmHg Patient Gender: F                HR:           85 bpm. Exam Location:  Inpatient Procedure: 2D Echo, Color Doppler, Cardiac Doppler and Intracardiac            Opacification Agent Indications:    R06.9 DOE, Elevated Troponins  History:        Patient has no prior history of Echocardiogram examinations.                 CHF, CAD; Risk Factors:Hypertension and Diabetes.  Sonographer:    Irving Burton Senior RDCS Referring Phys: [id number] TIMOTHY S OPYD  Sonographer Comments: Technically difficult study due to lung  interference from COPD. IMPRESSIONS  1. Left ventricular ejection fraction, by estimation, is 30 to 35%. The left ventricle has moderately decreased function. The left ventricle demonstrates global hypokinesis. There is mild concentric left ventricular hypertrophy. Left ventricular diastolic parameters are consistent with Grade II diastolic dysfunction (pseudonormalization).  2. Right ventricular systolic function is mildly reduced. The right ventricular size is normal.  3. Left atrial size was mildly dilated.  4. The mitral valve is normal in structure. Mild mitral valve regurgitation. No evidence of mitral stenosis.  5. The aortic valve is normal in structure. Aortic valve regurgitation is not visualized. No aortic stenosis is present.  6. The inferior vena cava is normal in size with greater than 50% respiratory variability, suggesting right atrial pressure of 3 mmHg. Comparison(s): No prior Echocardiogram. FINDINGS  Left Ventricle: Left ventricular ejection fraction, by estimation, is 30 to 35%. The left ventricle has moderately decreased function. The left ventricle demonstrates global hypokinesis. Definity contrast agent was given IV to delineate the left ventricular endocardial borders. The left ventricular internal cavity size was normal in size. There is mild concentric left ventricular hypertrophy. Left ventricular diastolic parameters are consistent with Grade II diastolic dysfunction (pseudonormalization). Right Ventricle: The right ventricular size is normal. No increase in right ventricular wall thickness. Right ventricular systolic function is mildly reduced. Left Atrium: Left atrial size was mildly dilated. Right Atrium: Right atrial size was normal in size. Pericardium: There is no evidence of pericardial effusion. Mitral Valve: The mitral valve is normal in structure. Mild mitral valve regurgitation. No evidence of mitral valve stenosis. Tricuspid Valve: The tricuspid valve is normal in structure.  Tricuspid valve regurgitation is not demonstrated. No evidence of tricuspid stenosis. Aortic Valve: The aortic valve is normal in structure. Aortic valve regurgitation is not visualized. No aortic stenosis is present. Pulmonic Valve: The pulmonic valve was normal in structure. Pulmonic valve regurgitation is not visualized. No evidence of pulmonic stenosis. Aorta: The aortic root is normal in size and structure. Venous: The inferior vena cava is normal in size with greater than 50%  respiratory variability, suggesting right atrial pressure of 3 mmHg. IAS/Shunts: No atrial level shunt detected by color flow Doppler.  LEFT VENTRICLE PLAX 2D LVIDd:         7.00 cm   Diastology LVIDs:         6.00 cm   LV e' medial:    4.03 cm/s LV PW:         1.10 cm   LV E/e' medial:  24.8 LV IVS:        0.90 cm   LV e' lateral:   6.74 cm/s LVOT diam:     2.20 cm   LV E/e' lateral: 14.8 LV SV:         50 LV SV Index:   23 LVOT Area:     3.80 cm  RIGHT VENTRICLE RV S prime:     9.25 cm/s TAPSE (M-mode): 1.6 cm LEFT ATRIUM             Index        RIGHT ATRIUM           Index LA diam:        4.10 cm 1.86 cm/m   RA Area:     20.90 cm LA Vol (A2C):   79.2 ml 35.99 ml/m  RA Volume:   58.40 ml  26.54 ml/m LA Vol (A4C):   80.7 ml 36.67 ml/m LA Biplane Vol: 84.9 ml 38.58 ml/m  AORTIC VALVE LVOT Vmax:   77.70 cm/s LVOT Vmean:  60.200 cm/s LVOT VTI:    0.132 m  AORTA Ao Root diam: 3.20 cm MITRAL VALVE MV Area (PHT): 4.01 cm    SHUNTS MV Decel Time: 189 msec    Systemic VTI:  0.13 m MV E velocity: 99.80 cm/s  Systemic Diam: 2.20 cm MV A velocity: 75.80 cm/s MV E/A ratio:  1.32 Kardie Tobb DO Electronically signed by Thomasene Ripple DO Signature Date/Time: 07/20/2021/2:48:01 PM    Final    DG Chest Port 1 View  Result Date: 07/20/2021 CLINICAL DATA:  Shortness of breath EXAM: PORTABLE CHEST 1 VIEW COMPARISON:  11/11/2018 FINDINGS: Cardiomegaly with vascular congestion. Interstitial prominence may reflect early interstitial edema. Bibasilar  atelectasis. No effusions or acute bony abnormality. IMPRESSION: Cardiomegaly, vascular congestion and possible early interstitial edema. Bibasilar atelectasis. Electronically Signed   By: Charlett Nose M.D.   On: 07/20/2021 01:39     Patient Profile     71 y.o. female sudden onset SOB with new HF   Assessment & Plan    Acute hypoxic respitatory failure HFrEF Chronicity unknown HTN with DM, initial suspicion of of HTN emergency Falsh pulmonary edema Former Tobacco Use - ASA today - statin based on LHC results - start losartan 25 mg PO daily today - adding aldactone 12.5 mg PO daily today in the setting of hypokalemia - Succinate 25 mg PO daily for 07/22/21 based on CO - planned for LHC/RHC today with per primary cardiologist - she has allergies listed to sitagliptin (itching) and had a UTI of empaglaflozin; we have reviewed a rechallange and she is hesitant but would consider it;  may plan for outpatient SGLT2i attempt or peri-discharge.     For questions or updates, please contact [facility] Please consult [email or web address] for contact info under Cardiology/STEMI.      Riley Lam, MD Cardiologist Hypertrophic Cardiomyopathy [facility]  [address] [place], [address] [place], [place] [address] [phone]  10:22 AM

## 2021-07-21 NOTE — Interval H&P Note (Signed)
Cath Lab Visit (complete for each Cath Lab visit)  Clinical Evaluation Leading to the Procedure:   ACS: Yes.    Non-ACS:    Anginal Classification: CCS II  Anti-ischemic medical therapy: Minimal Therapy (1 class of medications)  Non-Invasive Test Results: No non-invasive testing performed  Prior CABG: No previous CABG      History and Physical Interval Note:  07/21/2021 1:52 PM  Stacey Fletcher  has presented today for surgery, with the diagnosis of systolic heart failure, chest pain.  The various methods of treatment have been discussed with the patient and family. After consideration of risks, benefits and other options for treatment, the patient has consented to  Procedure(s): RIGHT/LEFT HEART CATH AND CORONARY ANGIOGRAPHY (N/A) as a surgical intervention.  The patient's history has been reviewed, patient examined, no change in status, stable for surgery.  I have reviewed the patient's chart and labs.  Questions were answered to the patient's satisfaction.     Nanetta Batty

## 2021-07-21 NOTE — Progress Notes (Signed)
8.1  NEUTROABS 3.2  --   --   HGB 12.8 10.7* 10.2*  HCT 41.4 35.0* 33.4*  MCV 84.1 82.5 81.1  PLT 277 236 227    Basic Metabolic Panel: Recent Labs  Lab 07/20/21 0024 07/20/21 0457 07/21/21 0207  NA 141 139 139  K 3.5 3.4* 3.2*  CL 102 102 104  CO2 25 26 25   GLUCOSE 217* 136* 154*  BUN 12 14 16   CREATININE 0.97 0.95 1.09*  CALCIUM 8.9 8.6* 8.9  MG  --  1.9  --     GFR: Estimated Creatinine Clearance: 59.9 mL/min (A)  (by C-G formula based on SCr of 1.09 mg/dL (H)).  Liver Function Tests: No results for input(s): "AST", "ALT", "ALKPHOS", "BILITOT", "PROT", "ALBUMIN" in the last 168 hours.  CBG: Recent Labs  Lab 07/20/21 1147 07/20/21 1720 07/20/21 2209 07/21/21 0623 07/21/21 1135  GLUCAP 155* 127* 148* 139* 131*     No results found for this or any previous visit (from the past 240 hour(s)).       Radiology Studies: ECHOCARDIOGRAM COMPLETE  Result Date: 07/20/2021    ECHOCARDIOGRAM REPORT   Patient Name:   Stacey Fletcher Date of Exam: 07/20/2021 Medical Rec #:  [name]        Height:       68.0 in Accession #:    07/22/[id number]       Weight:       238.0 lb Date of Birth:  [birth year]        BSA:          2.201 m Patient Age:    70 years         BP:           120/75 mmHg Patient Gender: F                HR:           85 bpm. Exam Location:  Inpatient Procedure: 2D Echo, Color Doppler, Cardiac Doppler and Intracardiac            Opacification Agent Indications:    R06.9 DOE, Elevated Troponins  History:        Patient has no prior history of Echocardiogram examinations.                 CHF, CAD; Risk Factors:Hypertension and Diabetes.  Sonographer:    09/29/1950 Senior RDCS Referring Phys: [id number] TIMOTHY S OPYD  Sonographer Comments: Technically difficult study due to lung interference from COPD. IMPRESSIONS  1. Left ventricular ejection fraction, by estimation, is 30 to 35%. The left ventricle has moderately decreased function. The left ventricle demonstrates global hypokinesis. There is mild concentric left ventricular hypertrophy. Left ventricular diastolic parameters are consistent with Grade II diastolic dysfunction (pseudonormalization).  2. Right ventricular systolic function is mildly reduced. The right ventricular size is normal.  3. Left atrial size was mildly dilated.  4. The mitral valve is normal in structure. Mild mitral valve regurgitation. No evidence of mitral stenosis.  5. The aortic valve is  normal in structure. Aortic valve regurgitation is not visualized. No aortic stenosis is present.  6. The inferior vena cava is normal in size with greater than 50% respiratory variability, suggesting right atrial pressure of 3 mmHg. Comparison(s): No prior Echocardiogram. FINDINGS  Left Ventricle: Left ventricular ejection fraction, by estimation, is 30 to 35%. The left ventricle has moderately decreased function. The left ventricle demonstrates global hypokinesis. Definity contrast agent was given IV to delineate  PROGRESS NOTE    Stacey Fletcher  KGU:[id number] DOB: 11/09/1950 DOA: 07/20/2021 PCP: Quitman Livings, MD   Chief Complaint  Patient presents with   Respiratory Distress    Brief Narrative:    Stacey Fletcher is a pleasant 71 y.o. female with medical history significant for hypertension, type 2 diabetes mellitus, depression, fibromyalgia, and remote history of heart catheterization without stents, now presenting to the emergency department with shortness of breath and chest discomfort.  She was noted with elevated blood pressure 220/130, and #pulmonary edema, for which she required BiPAP, she was admitted for further work-up.     Assessment & Plan:   Principal Problem:   Acute respiratory failure with hypoxia (HCC) Active Problems:   Hypothyroidism   Diabetes mellitus (HCC)   Hypertension   Depression   Elevated troponin   Acute CHF (congestive heart failure) (HCC)    Acute systolic/diastolic CHF acute hypoxic respiratory failure  flash pulmonary edema - Pt woke with acute respiratory distress and chest discomfort, was found by EMS to have BP 220/130 and O2 sat 65%, and was given 3 doses NTG and started on CPAP pta  - BP normalized with NTG, chest discomfort resolved, and respiratory status improved dramatically  - BNP is elevated to 271 and acute CHF findings noted on CXR  -2D echo significant for low EF 30 to 35%, with global hypokinesis, and grade 2 diastolic dysfunction -IV diuresis on hold given soft blood pressure. -Management per cardiology, to start on beta-blocker from tomorrow, started on losartan. -Plan for cardiac cath today -Started on Aldactone  Hypertension  -Initially blood pressure significantly elevated, but has been low yesterday, so meds has been adjusted -Management per cardiology, on beta-blockers and losartan  Hypokalemia -  repleted, started on Aldactone as well   Type II DM hold - A1c is 6.7 -CBG controlled on low intensity insulin sliding  scale   Depression; PTSD   - Continue Wellbutrin, Effexor, Minipress   Hypothyroidism -Continue with Synthroid    DVT prophylaxis: Lovenox Code Status: Full Family Communication: None nat bedside Disposition:   Status is: Inpatient    Consultants:  Cardiology   Subjective:  She denies any chest pain, shortness of breath, palpitation this morning, she is anxious about her cardiac cath today.  Objective: Vitals:   07/20/21 2006 07/20/21 2313 07/21/21 0419 07/21/21 0833  BP: 102/66 107/67 122/70 117/76  Pulse: 85 76 92 88  Resp: 20 14 17 18   Temp: 98.8 F (37.1 C) 98.3 F (36.8 C) 98.4 F (36.9 C) 98.3 F (36.8 C)  TempSrc: Oral Oral Oral Oral  SpO2: 100% 100% 95% 100%  Weight:   107.3 kg   Height:        Intake/Output Summary (Last 24 hours) at 07/21/2021 1202 Last data filed at 07/21/2021 07/23/2021 Gross per 24 hour  Intake 474 ml  Output 200 ml  Net 274 ml   Filed Weights   07/20/21 1714 07/21/21 0419  Weight: 107.1 kg 107.3 kg    Examination:  Awake Alert, Oriented X 3, No new F.N deficits, Normal affect Symmetrical Chest wall movement, Good air movement bilaterally, CTAB RRR,No Gallops,Rubs or new Murmurs, No Parasternal Heave +ve B.Sounds, Abd Soft, No tenderness, No rebound - guarding or rigidity. No Cyanosis, Clubbing or edema, No new Rash or bruise       Data Reviewed: I have personally reviewed following labs and imaging studies  CBC: Recent Labs  Lab 07/20/21 0024 07/20/21 0457 07/21/21 0207  WBC 10.3 8.7  PROGRESS NOTE    Stacey Fletcher  KGU:[id number] DOB: 11/09/1950 DOA: 07/20/2021 PCP: Quitman Livings, MD   Chief Complaint  Patient presents with   Respiratory Distress    Brief Narrative:    Stacey Fletcher is a pleasant 71 y.o. female with medical history significant for hypertension, type 2 diabetes mellitus, depression, fibromyalgia, and remote history of heart catheterization without stents, now presenting to the emergency department with shortness of breath and chest discomfort.  She was noted with elevated blood pressure 220/130, and #pulmonary edema, for which she required BiPAP, she was admitted for further work-up.     Assessment & Plan:   Principal Problem:   Acute respiratory failure with hypoxia (HCC) Active Problems:   Hypothyroidism   Diabetes mellitus (HCC)   Hypertension   Depression   Elevated troponin   Acute CHF (congestive heart failure) (HCC)    Acute systolic/diastolic CHF acute hypoxic respiratory failure  flash pulmonary edema - Pt woke with acute respiratory distress and chest discomfort, was found by EMS to have BP 220/130 and O2 sat 65%, and was given 3 doses NTG and started on CPAP pta  - BP normalized with NTG, chest discomfort resolved, and respiratory status improved dramatically  - BNP is elevated to 271 and acute CHF findings noted on CXR  -2D echo significant for low EF 30 to 35%, with global hypokinesis, and grade 2 diastolic dysfunction -IV diuresis on hold given soft blood pressure. -Management per cardiology, to start on beta-blocker from tomorrow, started on losartan. -Plan for cardiac cath today -Started on Aldactone  Hypertension  -Initially blood pressure significantly elevated, but has been low yesterday, so meds has been adjusted -Management per cardiology, on beta-blockers and losartan  Hypokalemia -  repleted, started on Aldactone as well   Type II DM hold - A1c is 6.7 -CBG controlled on low intensity insulin sliding  scale   Depression; PTSD   - Continue Wellbutrin, Effexor, Minipress   Hypothyroidism -Continue with Synthroid    DVT prophylaxis: Lovenox Code Status: Full Family Communication: None nat bedside Disposition:   Status is: Inpatient    Consultants:  Cardiology   Subjective:  She denies any chest pain, shortness of breath, palpitation this morning, she is anxious about her cardiac cath today.  Objective: Vitals:   07/20/21 2006 07/20/21 2313 07/21/21 0419 07/21/21 0833  BP: 102/66 107/67 122/70 117/76  Pulse: 85 76 92 88  Resp: 20 14 17 18   Temp: 98.8 F (37.1 C) 98.3 F (36.8 C) 98.4 F (36.9 C) 98.3 F (36.8 C)  TempSrc: Oral Oral Oral Oral  SpO2: 100% 100% 95% 100%  Weight:   107.3 kg   Height:        Intake/Output Summary (Last 24 hours) at 07/21/2021 1202 Last data filed at 07/21/2021 07/23/2021 Gross per 24 hour  Intake 474 ml  Output 200 ml  Net 274 ml   Filed Weights   07/20/21 1714 07/21/21 0419  Weight: 107.1 kg 107.3 kg    Examination:  Awake Alert, Oriented X 3, No new F.N deficits, Normal affect Symmetrical Chest wall movement, Good air movement bilaterally, CTAB RRR,No Gallops,Rubs or new Murmurs, No Parasternal Heave +ve B.Sounds, Abd Soft, No tenderness, No rebound - guarding or rigidity. No Cyanosis, Clubbing or edema, No new Rash or bruise       Data Reviewed: I have personally reviewed following labs and imaging studies  CBC: Recent Labs  Lab 07/20/21 0024 07/20/21 0457 07/21/21 0207  WBC 10.3 8.7  PROGRESS NOTE    Stacey Fletcher  KGU:[id number] DOB: 11/09/1950 DOA: 07/20/2021 PCP: Quitman Livings, MD   Chief Complaint  Patient presents with   Respiratory Distress    Brief Narrative:    Stacey Fletcher is a pleasant 71 y.o. female with medical history significant for hypertension, type 2 diabetes mellitus, depression, fibromyalgia, and remote history of heart catheterization without stents, now presenting to the emergency department with shortness of breath and chest discomfort.  She was noted with elevated blood pressure 220/130, and #pulmonary edema, for which she required BiPAP, she was admitted for further work-up.     Assessment & Plan:   Principal Problem:   Acute respiratory failure with hypoxia (HCC) Active Problems:   Hypothyroidism   Diabetes mellitus (HCC)   Hypertension   Depression   Elevated troponin   Acute CHF (congestive heart failure) (HCC)    Acute systolic/diastolic CHF acute hypoxic respiratory failure  flash pulmonary edema - Pt woke with acute respiratory distress and chest discomfort, was found by EMS to have BP 220/130 and O2 sat 65%, and was given 3 doses NTG and started on CPAP pta  - BP normalized with NTG, chest discomfort resolved, and respiratory status improved dramatically  - BNP is elevated to 271 and acute CHF findings noted on CXR  -2D echo significant for low EF 30 to 35%, with global hypokinesis, and grade 2 diastolic dysfunction -IV diuresis on hold given soft blood pressure. -Management per cardiology, to start on beta-blocker from tomorrow, started on losartan. -Plan for cardiac cath today -Started on Aldactone  Hypertension  -Initially blood pressure significantly elevated, but has been low yesterday, so meds has been adjusted -Management per cardiology, on beta-blockers and losartan  Hypokalemia -  repleted, started on Aldactone as well   Type II DM hold - A1c is 6.7 -CBG controlled on low intensity insulin sliding  scale   Depression; PTSD   - Continue Wellbutrin, Effexor, Minipress   Hypothyroidism -Continue with Synthroid    DVT prophylaxis: Lovenox Code Status: Full Family Communication: None nat bedside Disposition:   Status is: Inpatient    Consultants:  Cardiology   Subjective:  She denies any chest pain, shortness of breath, palpitation this morning, she is anxious about her cardiac cath today.  Objective: Vitals:   07/20/21 2006 07/20/21 2313 07/21/21 0419 07/21/21 0833  BP: 102/66 107/67 122/70 117/76  Pulse: 85 76 92 88  Resp: 20 14 17 18   Temp: 98.8 F (37.1 C) 98.3 F (36.8 C) 98.4 F (36.9 C) 98.3 F (36.8 C)  TempSrc: Oral Oral Oral Oral  SpO2: 100% 100% 95% 100%  Weight:   107.3 kg   Height:        Intake/Output Summary (Last 24 hours) at 07/21/2021 1202 Last data filed at 07/21/2021 07/23/2021 Gross per 24 hour  Intake 474 ml  Output 200 ml  Net 274 ml   Filed Weights   07/20/21 1714 07/21/21 0419  Weight: 107.1 kg 107.3 kg    Examination:  Awake Alert, Oriented X 3, No new F.N deficits, Normal affect Symmetrical Chest wall movement, Good air movement bilaterally, CTAB RRR,No Gallops,Rubs or new Murmurs, No Parasternal Heave +ve B.Sounds, Abd Soft, No tenderness, No rebound - guarding or rigidity. No Cyanosis, Clubbing or edema, No new Rash or bruise       Data Reviewed: I have personally reviewed following labs and imaging studies  CBC: Recent Labs  Lab 07/20/21 0024 07/20/21 0457 07/21/21 0207  WBC 10.3 8.7

## 2021-07-21 NOTE — Progress Notes (Addendum)
Progress Note  Patient Name: [name] Date of Encounter: 07/21/2021  Primary Cardiologist: Lance Muss, MD   Subjective   Overnight no events. Patient notes that she feels ok.  Is worried about dying during cath No CP, SOB, Palpitations.  Inpatient Medications    Scheduled Meds:  aspirin  81 mg Oral Daily   buPROPion  300 mg Oral q morning   enoxaparin (LOVENOX) injection  40 mg Subcutaneous Daily   gabapentin  100 mg Oral QHS   insulin aspart  0-5 Units Subcutaneous QHS   insulin aspart  0-6 Units Subcutaneous TID WC   levothyroxine  25 mcg Oral QAC breakfast   losartan  25 mg Oral Daily   potassium chloride SA  20 mEq Oral Daily   pravastatin  20 mg Oral q AM   sodium chloride flush  3 mL Intravenous Q12H   sodium chloride flush  3 mL Intravenous Q12H   venlafaxine XR  75 mg Oral Q breakfast   Continuous Infusions:  sodium chloride     sodium chloride 10 mL/hr at 07/21/21 0711   PRN Meds: sodium chloride, acetaminophen **OR** acetaminophen, ondansetron **OR** ondansetron (ZOFRAN) IV, senna-docusate, sodium chloride flush   Vital Signs    Vitals:   07/20/21 2006 07/20/21 2313 07/21/21 0419 07/21/21 0833  BP: 102/66 107/67 122/70 117/76  Pulse: 85 76 92 88  Resp: 20 14 17 18   Temp: 98.8 F (37.1 C) 98.3 F (36.8 C) 98.4 F (36.9 C) 98.3 F (36.8 C)  TempSrc: Oral Oral Oral Oral  SpO2: 100% 100% 95% 100%  Weight:   107.3 kg   Height:        Intake/Output Summary (Last 24 hours) at 07/21/2021 1022 Last data filed at 07/21/2021 5284 Gross per 24 hour  Intake 474 ml  Output 200 ml  Net 274 ml   Filed Weights   07/20/21 1714 07/21/21 0419  Weight: 107.1 kg 107.3 kg    Telemetry    SR - Personally Reviewed  Physical Exam   Gen: No distress Neck: No JVD,  Cardiac: No Rubs or Gallops, soft systolic Murmur, RRR +2 radial pulses Respiratory: Clear to auscultation bilaterally, normal effort, normal  respiratory rate GI: Soft,  nontender, non-distended  MS: No  edema;  moves all extremities Integument: Skin feels cool not cold Neuro:  At time of evaluation, alert and oriented to person/place/time/situation  Psych: Normal affect, patient feels well   Labs    Chemistry Recent Labs  Lab 07/20/21 0024 07/20/21 0457 07/21/21 0207  NA 141 139 139  K 3.5 3.4* 3.2*  CL 102 102 104  CO2 25 26 25   GLUCOSE 217* 136* 154*  BUN 12 14 16   CREATININE 0.97 0.95 1.09*  CALCIUM 8.9 8.6* 8.9  GFRNONAA >60 >60 54*  ANIONGAP 14 11 10      Hematology Recent Labs  Lab 07/20/21 0024 07/20/21 0457 07/21/21 0207  WBC 10.3 8.7 8.1  RBC 4.92 4.24 4.12  HGB 12.8 10.7* 10.2*  HCT 41.4 35.0* 33.4*  MCV 84.1 82.5 81.1  MCH 26.0 25.2* 24.8*  MCHC 30.9 30.6 30.5  RDW 16.1* 16.3* 16.1*  PLT 277 236 227    Cardiac EnzymesNo results for input(s): "TROPONINI" in the last 168 hours. No results for input(s): "TROPIPOC" in the last 168 hours.   BNP Recent Labs  Lab 07/20/21 0024  BNP 270.5*     DDimer No results for input(s): "DDIMER" in the last 168 hours.   Radiology  ECHOCARDIOGRAM COMPLETE  Result Date: 07/20/2021    ECHOCARDIOGRAM REPORT   Patient Name:   SHAKELA DAQUINO Date of Exam: 07/20/2021 Medical Rec #:  [id number]        Height:       68.0 in Accession #:    [id number]       Weight:       238.0 lb Date of Birth:  06/13/1950        BSA:          2.201 m Patient Age:    70 years         BP:           120/75 mmHg Patient Gender: F                HR:           85 bpm. Exam Location:  Inpatient Procedure: 2D Echo, Color Doppler, Cardiac Doppler and Intracardiac            Opacification Agent Indications:    R06.9 DOE, Elevated Troponins  History:        Patient has no prior history of Echocardiogram examinations.                 CHF, CAD; Risk Factors:Hypertension and Diabetes.  Sonographer:    Irving Burton Senior RDCS Referring Phys: [id number] TIMOTHY S OPYD  Sonographer Comments: Technically difficult study due to lung  interference from COPD. IMPRESSIONS  1. Left ventricular ejection fraction, by estimation, is 30 to 35%. The left ventricle has moderately decreased function. The left ventricle demonstrates global hypokinesis. There is mild concentric left ventricular hypertrophy. Left ventricular diastolic parameters are consistent with Grade II diastolic dysfunction (pseudonormalization).  2. Right ventricular systolic function is mildly reduced. The right ventricular size is normal.  3. Left atrial size was mildly dilated.  4. The mitral valve is normal in structure. Mild mitral valve regurgitation. No evidence of mitral stenosis.  5. The aortic valve is normal in structure. Aortic valve regurgitation is not visualized. No aortic stenosis is present.  6. The inferior vena cava is normal in size with greater than 50% respiratory variability, suggesting right atrial pressure of 3 mmHg. Comparison(s): No prior Echocardiogram. FINDINGS  Left Ventricle: Left ventricular ejection fraction, by estimation, is 30 to 35%. The left ventricle has moderately decreased function. The left ventricle demonstrates global hypokinesis. Definity contrast agent was given IV to delineate the left ventricular endocardial borders. The left ventricular internal cavity size was normal in size. There is mild concentric left ventricular hypertrophy. Left ventricular diastolic parameters are consistent with Grade II diastolic dysfunction (pseudonormalization). Right Ventricle: The right ventricular size is normal. No increase in right ventricular wall thickness. Right ventricular systolic function is mildly reduced. Left Atrium: Left atrial size was mildly dilated. Right Atrium: Right atrial size was normal in size. Pericardium: There is no evidence of pericardial effusion. Mitral Valve: The mitral valve is normal in structure. Mild mitral valve regurgitation. No evidence of mitral valve stenosis. Tricuspid Valve: The tricuspid valve is normal in structure.  Tricuspid valve regurgitation is not demonstrated. No evidence of tricuspid stenosis. Aortic Valve: The aortic valve is normal in structure. Aortic valve regurgitation is not visualized. No aortic stenosis is present. Pulmonic Valve: The pulmonic valve was normal in structure. Pulmonic valve regurgitation is not visualized. No evidence of pulmonic stenosis. Aorta: The aortic root is normal in size and structure. Venous: The inferior vena cava is normal in size with greater than 50%  respiratory variability, suggesting right atrial pressure of 3 mmHg. IAS/Shunts: No atrial level shunt detected by color flow Doppler.  LEFT VENTRICLE PLAX 2D LVIDd:         7.00 cm   Diastology LVIDs:         6.00 cm   LV e' medial:    4.03 cm/s LV PW:         1.10 cm   LV E/e' medial:  24.8 LV IVS:        0.90 cm   LV e' lateral:   6.74 cm/s LVOT diam:     2.20 cm   LV E/e' lateral: 14.8 LV SV:         50 LV SV Index:   23 LVOT Area:     3.80 cm  RIGHT VENTRICLE RV S prime:     9.25 cm/s TAPSE (M-mode): 1.6 cm LEFT ATRIUM             Index        RIGHT ATRIUM           Index LA diam:        4.10 cm 1.86 cm/m   RA Area:     20.90 cm LA Vol (A2C):   79.2 ml 35.99 ml/m  RA Volume:   58.40 ml  26.54 ml/m LA Vol (A4C):   80.7 ml 36.67 ml/m LA Biplane Vol: 84.9 ml 38.58 ml/m  AORTIC VALVE LVOT Vmax:   77.70 cm/s LVOT Vmean:  60.200 cm/s LVOT VTI:    0.132 m  AORTA Ao Root diam: 3.20 cm MITRAL VALVE MV Area (PHT): 4.01 cm    SHUNTS MV Decel Time: 189 msec    Systemic VTI:  0.13 m MV E velocity: 99.80 cm/s  Systemic Diam: 2.20 cm MV A velocity: 75.80 cm/s MV E/A ratio:  1.32 Kardie Tobb DO Electronically signed by Thomasene Ripple DO Signature Date/Time: 07/20/2021/2:48:01 PM    Final    DG Chest Port 1 View  Result Date: 07/20/2021 CLINICAL DATA:  Shortness of breath EXAM: PORTABLE CHEST 1 VIEW COMPARISON:  11/11/2018 FINDINGS: Cardiomegaly with vascular congestion. Interstitial prominence may reflect early interstitial edema. Bibasilar  atelectasis. No effusions or acute bony abnormality. IMPRESSION: Cardiomegaly, vascular congestion and possible early interstitial edema. Bibasilar atelectasis. Electronically Signed   By: Charlett Nose M.D.   On: 07/20/2021 01:39     Patient Profile     71 y.o. female sudden onset SOB with new HF   Assessment & Plan    Acute hypoxic respitatory failure HFrEF Chronicity unknown HTN with DM, initial suspicion of of HTN emergency Fals[place] pulmonary edema Former Tobacco Use - ASA today - statin based on LHC results - start losartan 25 mg PO daily today - adding aldactone 12.5 mg PO daily today in the setting of hypokalemia - Succinate 25 mg PO daily for 07/22/21 based on CO - planned for LHC/RHC today with per primary cardiologist - she has allergies listed to sitagliptin (itching) and had a UTI of empaglaflozin; we have reviewed a rechallange and she is hesitant but would consider it;  may plan for outpatient SGLT2i attempt or peri-discharge.     For questions or updates, please contact [facility] Please consult [email or web address] for contact info under Cardiology/STEMI.      Riley Lam, MD Cardiologist Hypertrophic Cardiomyopathy [facility]  [address] [place], [address] [place], [place] [address] [phone]  10:22 AM

## 2021-07-21 NOTE — Progress Notes (Signed)
Heart Failure Stewardship Pharmacist Progress Note   PCP: Soundra Pilon, FNP PCP-Cardiologist: Lance Muss, MD    HPI:   71 y.o. female with PMH of hypertension, type 2 diabetes, depression, fibromyalgia, hypothyroidism, chronic back pain, former tobacco use, who presented on 07/20/2021 with CP  She was initially seen by Dr. Eden Emms 08/20/2014 with chest pain, that was atypical, had negative EKG and troponin.  Recommended outpatient Myoview for ischemic evaluation but never followed up.  She was lost to follow-up since.    She was last seen by Dr. Eldridge Dace 06/28/2020 as a new patient for preop evaluation for lumbar fusion. Reportedly, she watched her son being murdered in [place] in 1990 where she had chest pain required hospitalization. She had a cardiac cath, no stent was placed.  She is able to tolerate mowing the lawns besides some knee pain.  She had no anginal symptoms and no further cardiac testing was recommended before operation.   Patient presented to the ED on 07/20/2021 complaining sudden onset of shortness of breath and chest pain.  She woke up with above symptoms.  She was in normal state of health last night.  EMS was called, she was found to be hypoxic with pulse ox 60% on room air and bilateral crackles.  She was also noted hypertensive with blood pressure 220/130 at field.  She required BiPAP support, was given 3 doses of sublingual nitroglycerin.   Admission diagnostic revealed hyperglycemia 217, otherwise unremarkable CBC differential and BMP.  BNP elevated to 70.  High sensitive troponin 70 >83 >93.  Chest x-ray revealed cardiomegaly with vascular congestion and possible early interstitial edema, bibasilar atelectasis. EKG reveals sinus tachycardia with ventricular rate of 119 bpm, non-specific ST-T abnormalities.  She was concern for acute heart failure/flash pulmonary edema, admitted to hospital medicine for acute hypoxic respiratory failure, started on IV Lasix,  cardiology is consulted for further input.   Upon encounter, she states she had sudden onset of SOB and chest pain last night. Chest was extremely uncomfortable, non-radiating. She states her BP is normal 120/80s and takes her BP meds daily. Her regular activity is limited due to knee pain from osteoarthritis. She states her SOB and chest pain had resolved. She had urinated a lot. She is feeling well now. She denied any dizziness, syncope, nausea, vomiting, leg edema, orthopnea.   Echo with LVEF 30-35%, mild LVH, GIIDD, RV midly reduced, mild MR.   R/LHC on 07/21/2021 with clean coronary arteries, severe LV dysfunction and elevated filling pressures (RA 9, RV 50/7, PAP 48/27(36), WP 36, CO 5.39, CI 2.46).   Current HF Medications: Diuretic: furosemide 40 mg IV BID Beta Blocker: none ACE/ARB/ARNI: losartan 25 mg daily Aldosterone Antagonist: spironolactone 12.5 mg daily SGLT2i: none Other: Kcl 20 mEq daily   Prior to admission HF Medications: None  Pertinent Lab Values: Serum creatinine 1.09, BUN 16, Potassium 3.2, Sodium 139, BNP 270.5, Magnesium 1.9, A1c 6.7%  Vital Signs: Weight: 236 lbs (admit weight: 236 lbs) Blood pressure: 110-130/60-80  Heart rate: 85-100  I/O: 200 cc UOP (likely incomplete documentation)  Medication Assistance / Insurance Benefits Check: Does the patient have prescription insurance?  Yes Type of insurance plan: [employer] Medicare Rx  Outpatient Pharmacy:  Prior to admission outpatient pharmacy: [facility] Is the patient willing to use [facility] TOC pharmacy at discharge? Yes Is the patient willing to transition their outpatient pharmacy to utilize a [facility] outpatient pharmacy?   Pending  Assessment: 1. Acute CHF (LVEF 30-35%), due to unclear  etiology. NYHA class II symptoms. -WP elevated on cath agree with continued diuresis with furosemide 40 mg IV BID.  -A1c 6.7% would benefit from addition of SGLT2i regardless of EF as has DM -BP controlled, BP on admit  elevated, would be a good Entresto candidate and spironolactone to help control BP. Currently on losartan 25 mg daily.  -EF reduced, beta blocker held until cath, cath with preserved CO, agree with initiation of metoprolol succinate 25 mg daily on 07/22/2021 -Keep K >4, Mg >2, on Kcl daily, also received Kcl 40 mEq x1   Plan: 1) Medication changes recommended at this time: -Start Farxiga 10 mg daily   2) Patient assistance: -copay $0 for Entresto/Jardiance/Farxiga  3)  Education  - To be completed prior to discharge  Drake Leach, PharmD, BCPS PGY2 Cardiology Pharmacy Resident

## 2021-07-21 NOTE — Progress Notes (Signed)
Heart Failure Nurse Navigator Progress Note  PCP: Quitman Livings, MD PCP-Cardiologist: Eldridge Dace Admission Diagnosis: Acute respiratory failure with hypoxia, Acute pulmonary edema.  Admitted from: Home via EMS  Presentation:   Stacey Fletcher presented with respiratory distress after waking up in her sleep. Oxygen sats were in the 60's, she had mild chest pain, nitroglycerin was given by EMS. BP 220/130 , HR 117, placed on Bi-pap, BNP 270.5, Troponin 70, transitioned to 4 L O2. Admitted. Plan for a cardiac cath on 07/21/21.   Patient and daughter (whom she lives with) were educated on the sign and symptoms of heart failure, daily weights, when to call her doctor or go to the ER. Diet/ fluid restrictions, taking all her medications as prescribed and attending all her medical appointments. Both interacted during the education and asked a number of questions, they verbalized their understanding, a hospital HF TOC appointment was scheduled for 08/02/21 @ 2 pm.   ECHO/ LVEF: EF 30-35%  Clinical Course:  Past Medical History:  Diagnosis Date   Coronary artery disease    Depression    Diabetes mellitus    Fibromyalgia    Goiter    Hypertension    MI (myocardial infarction) (HCC)    Osteoarthritis    Pneumonia    10+ years ago.     Social History   Socioeconomic History   Marital status: Divorced    Spouse name: Not on file   Number of children: 3   Years of education: Not on file   Highest education level: Bachelor's degree (e.g., BA, AB, BS)  Occupational History   Occupation: retired  Tobacco Use   Smoking status: Former    Types: Cigarettes    Quit date: 07/07/2018    Years since quitting: 3.0   Smokeless tobacco: Never  Vaping Use   Vaping Use: Never used  Substance and Sexual Activity   Alcohol use: No   Drug use: No   Sexual activity: Not Currently  Other Topics Concern   Not on file  Social History Narrative   Not on file   Social Determinants of Health    Financial Resource Strain: Low Risk  (07/21/2021)   Overall Financial Resource Strain (CARDIA)    Difficulty of Paying Living Expenses: Not very hard  Food Insecurity: No Food Insecurity (07/21/2021)   Hunger Vital Sign    Worried About Running Out of Food in the Last Year: Never true    Ran Out of Food in the Last Year: Never true  Transportation Needs: No Transportation Needs (07/21/2021)   PRAPARE - Administrator, Civil Service (Medical): No    Lack of Transportation (Non-Medical): No  Physical Activity: Not on file  Stress: Not on file  Social Connections: Not on file   Education Assessment and Provision:  Detailed education and instructions provided on heart failure disease management including the following:  Signs and symptoms of Heart Failure When to call the physician Importance of daily weights Low sodium diet Fluid restriction Medication management Anticipated future follow-up appointments  Patient education given on each of the above topics.  Patient acknowledges understanding via teach back method and acceptance of all instructions.  Education Materials:  "Living Better With Heart Failure" Booklet, HF zone tool, & Daily Weight Tracker Tool.  Patient has scale at home: yes Patient has pill box at home: NA    High Risk Criteria for Readmission and/or Poor Patient Outcomes: Heart failure hospital admissions (last 6 months): 0  No Show  rate: 5 %  Difficult social situation: no Demonstrates medication adherence: Yes Primary Language: English Literacy level: Reading, writing, and comprehension.   Barriers of Care:   New HF Dx. EF 30-35% Diet/ fluid restrictions Daily weights  Considerations/Referrals:   Referral made to Heart Failure Pharmacist Stewardship: yes Referral made to Heart Failure CSW/NCM TOC: No Referral made to Heart & Vascular TOC clinic: Yes, 08/02/21 @ 2 pm.  Items for Follow-up on DC/TOC: Continued educ. On HF ( newly  Dx) Diet/ fluid restrictions Daily weights   Rhae Hammock, BSN, RN Heart Failure Print production planner Chat Only

## 2021-07-22 DIAGNOSIS — J810 Acute pulmonary edema: Secondary | ICD-10-CM

## 2021-07-22 DIAGNOSIS — I5041 Acute combined systolic (congestive) and diastolic (congestive) heart failure: Secondary | ICD-10-CM

## 2021-07-22 LAB — GLUCOSE, CAPILLARY
Glucose-Capillary: 131 mg/dL — ABNORMAL HIGH (ref 70–99)
Glucose-Capillary: 133 mg/dL — ABNORMAL HIGH (ref 70–99)
Glucose-Capillary: 113 mg/dL — ABNORMAL HIGH (ref 70–99)
Glucose-Capillary: 137 mg/dL — ABNORMAL HIGH (ref 70–99)

## 2021-07-22 LAB — CBC
HCT: 36.3 % (ref 36.0–46.0)
Hemoglobin: 11.2 g/dL — ABNORMAL LOW (ref 12.0–15.0)
MCH: 24.8 pg — ABNORMAL LOW (ref 26.0–34.0)
MCHC: 30.9 g/dL (ref 30.0–36.0)
MCV: 80.5 fL (ref 80.0–100.0)
Platelets: 223 10*3/uL (ref 150–400)
RBC: 4.51 MIL/uL (ref 3.87–5.11)
RDW: 16.2 % — ABNORMAL HIGH (ref 11.5–15.5)
WBC: 7.7 10*3/uL (ref 4.0–10.5)
nRBC: 0 % (ref 0.0–0.2)

## 2021-07-22 LAB — BASIC METABOLIC PANEL
Anion gap: 8 (ref 5–15)
BUN: 14 mg/dL (ref 8–23)
CO2: 27 mmol/L (ref 22–32)
Calcium: 9.2 mg/dL (ref 8.9–10.3)
Chloride: 101 mmol/L (ref 98–111)
Creatinine, Ser: 0.98 mg/dL (ref 0.44–1.00)
GFR, Estimated: >60 mL/min (ref >60)
Glucose, Bld: 126 mg/dL — ABNORMAL HIGH (ref 70–99)
Potassium: 3.2 mmol/L — ABNORMAL LOW (ref 3.5–5.1)
Sodium: 136 mmol/L (ref 135–145)

## 2021-07-22 MED ORDER — DAPAGLIFLOZIN PROPANEDIOL 10 MG PO TABS
10.0000 mg | ORAL_TABLET | Freq: Every day | ORAL | Status: DC
  Administered 2021-07-22 – 2021-07-23 (×2): 10 mg via ORAL
  Filled 2021-07-22 (×2): qty 1

## 2021-07-22 MED ORDER — METOPROLOL SUCCINATE ER 25 MG PO TB24
25.0000 mg | ORAL_TABLET | Freq: Every day | ORAL | Status: DC
  Administered 2021-07-22: 25 mg via ORAL
  Filled 2021-07-22: qty 1

## 2021-07-22 MED ORDER — SPIRONOLACTONE 25 MG PO TABS
25.0000 mg | ORAL_TABLET | Freq: Every day | ORAL | Status: DC
  Administered 2021-07-23: 25 mg via ORAL
  Filled 2021-07-22: qty 1

## 2021-07-22 MED ORDER — POTASSIUM CHLORIDE CRYS ER 20 MEQ PO TBCR
40.0000 meq | EXTENDED_RELEASE_TABLET | Freq: Four times a day (QID) | ORAL | Status: AC
  Administered 2021-07-22 (×2): 40 meq via ORAL
  Filled 2021-07-22 (×2): qty 2

## 2021-07-22 MED ORDER — SPIRONOLACTONE 12.5 MG HALF TABLET
12.5000 mg | ORAL_TABLET | Freq: Once | ORAL | Status: AC
Start: 2021-07-22 — End: 2021-07-22
  Administered 2021-07-22: 12.5 mg via ORAL
  Filled 2021-07-22: qty 1

## 2021-07-22 NOTE — Progress Notes (Addendum)
07/20/21 0457 07/21/21 0207 07/21/21 1417 07/21/21 1421 07/22/21 0057  WBC 10.3 8.7 8.1  --   --  7.7  NEUTROABS 3.2  --   --   --   --   --   HGB 12.8 10.7* 10.2* 11.9*  11.9* 12.2 11.2*  HCT 41.4 35.0* 33.4* 35.0*  35.0* 36.0 36.3  MCV 84.1 82.5 81.1  --   --  80.5  PLT 277 236 227  --   --  223    Basic Metabolic Panel: Recent Labs  Lab 07/20/21 0024 07/20/21 0457 07/21/21 0207 07/21/21 1417 07/21/21 1421 07/22/21 0057  NA 141 139 139 141  141 141 136  K 3.5  3.4* 3.2* 3.7  3.6 3.6 3.2*  CL 102 102 104  --   --  101  CO2 25 26 25   --   --  27  GLUCOSE 217* 136* 154*  --   --  126*  BUN 12 14 16   --   --  14  CREATININE 0.97 0.95 1.09*  --   --  0.98  CALCIUM 8.9 8.6* 8.9  --   --  9.2  MG  --  1.9  --   --   --   --     GFR: Estimated Creatinine Clearance: 64.6 mL/min (by C-G formula based on SCr of 0.98 mg/dL).  Liver Function Tests: No results for input(s): "AST", "ALT", "ALKPHOS", "BILITOT", "PROT", "ALBUMIN" in the last 168 hours.  CBG: Recent Labs  Lab 07/21/21 1135 07/21/21 1638 07/21/21 2125 07/22/21 0634 07/22/21 1137  GLUCAP 131* 107* 115* 131* 133*     No results found for this or any previous visit (from the past 240 hour(s)).       Radiology Studies: CARDIAC CATHETERIZATION  Result Date: 07/21/2021 Images from the original result were not included. Jerlean Peralta is a 71 y.o. female  [name] LOCATION:  FACILITY: [facility] PHYSICIAN: 59, M.D. Apr 30, 1950 DATE OF PROCEDURE:  07/21/2021 DATE OF DISCHARGE: CARDIAC CATHETERIZATION History obtained from chart review.71 y.o. female sudden onset SOB with new HF.  Her troponins were mildly elevated but flat.  Her EKG showed no acute changes.  Her 2D echo revealed EF of 30 to 35% without valvular abnormalities.  She was referred for right left heart cath with define her anatomy and physiology. HEMODYNAMICS: 1: Right atrial pressure-14/9, mean 9 2: Right ventricular pressure-50/7 3: Pulmonary artery pressure-48/27, mean 36 4: Pulmonary wedge pressure-A-wave 35, V wave 43, mean 36 5: LVEDP-33 6: Cardiac output-5.39 L/min with an index of 2.46 L/min/m    Ms. Ambers has clean coronary arteries with severe LV dysfunction and elevated filling pressures.  She requires additional diuresis and guideline directed optimal medical therapy.  I reviewed her anatomy and physiology with her attending cardiologist, Dr. 04-07-1987.  Her radial sheath was removed and a TR band was  placed on the right wrist to achieve patent hemostasis.  The patient left lab in stable condition. Mikey Bussing. MD, [facility] 07/21/2021 2:35 PM    ECHOCARDIOGRAM COMPLETE  Result Date: 07/20/2021    ECHOCARDIOGRAM REPORT   Patient Name:   REAH JUSTO Date of Exam: 07/20/2021 Medical Rec #:  [name]        Height:       68.0 in Accession #:    07/22/2021       Weight:       238.0 lb Date of Birth:  [birth year]        BSA:  07/20/21 0457 07/21/21 0207 07/21/21 1417 07/21/21 1421 07/22/21 0057  WBC 10.3 8.7 8.1  --   --  7.7  NEUTROABS 3.2  --   --   --   --   --   HGB 12.8 10.7* 10.2* 11.9*  11.9* 12.2 11.2*  HCT 41.4 35.0* 33.4* 35.0*  35.0* 36.0 36.3  MCV 84.1 82.5 81.1  --   --  80.5  PLT 277 236 227  --   --  223    Basic Metabolic Panel: Recent Labs  Lab 07/20/21 0024 07/20/21 0457 07/21/21 0207 07/21/21 1417 07/21/21 1421 07/22/21 0057  NA 141 139 139 141  141 141 136  K 3.5  3.4* 3.2* 3.7  3.6 3.6 3.2*  CL 102 102 104  --   --  101  CO2 25 26 25   --   --  27  GLUCOSE 217* 136* 154*  --   --  126*  BUN 12 14 16   --   --  14  CREATININE 0.97 0.95 1.09*  --   --  0.98  CALCIUM 8.9 8.6* 8.9  --   --  9.2  MG  --  1.9  --   --   --   --     GFR: Estimated Creatinine Clearance: 64.6 mL/min (by C-G formula based on SCr of 0.98 mg/dL).  Liver Function Tests: No results for input(s): "AST", "ALT", "ALKPHOS", "BILITOT", "PROT", "ALBUMIN" in the last 168 hours.  CBG: Recent Labs  Lab 07/21/21 1135 07/21/21 1638 07/21/21 2125 07/22/21 0634 07/22/21 1137  GLUCAP 131* 107* 115* 131* 133*     No results found for this or any previous visit (from the past 240 hour(s)).       Radiology Studies: CARDIAC CATHETERIZATION  Result Date: 07/21/2021 Images from the original result were not included. Jerlean Peralta is a 71 y.o. female  [name] LOCATION:  FACILITY: [facility] PHYSICIAN: 59, M.D. Apr 30, 1950 DATE OF PROCEDURE:  07/21/2021 DATE OF DISCHARGE: CARDIAC CATHETERIZATION History obtained from chart review.71 y.o. female sudden onset SOB with new HF.  Her troponins were mildly elevated but flat.  Her EKG showed no acute changes.  Her 2D echo revealed EF of 30 to 35% without valvular abnormalities.  She was referred for right left heart cath with define her anatomy and physiology. HEMODYNAMICS: 1: Right atrial pressure-14/9, mean 9 2: Right ventricular pressure-50/7 3: Pulmonary artery pressure-48/27, mean 36 4: Pulmonary wedge pressure-A-wave 35, V wave 43, mean 36 5: LVEDP-33 6: Cardiac output-5.39 L/min with an index of 2.46 L/min/m    Ms. Ambers has clean coronary arteries with severe LV dysfunction and elevated filling pressures.  She requires additional diuresis and guideline directed optimal medical therapy.  I reviewed her anatomy and physiology with her attending cardiologist, Dr. 04-07-1987.  Her radial sheath was removed and a TR band was  placed on the right wrist to achieve patent hemostasis.  The patient left lab in stable condition. Mikey Bussing. MD, [facility] 07/21/2021 2:35 PM    ECHOCARDIOGRAM COMPLETE  Result Date: 07/20/2021    ECHOCARDIOGRAM REPORT   Patient Name:   REAH JUSTO Date of Exam: 07/20/2021 Medical Rec #:  [name]        Height:       68.0 in Accession #:    07/22/2021       Weight:       238.0 lb Date of Birth:  [birth year]        BSA:  PROGRESS NOTE    Tiki Tucciarone  ZJI:[id number] DOB: [birth year] DOA: 07/20/2021 PCP: Quitman Livings, MD   Chief Complaint  Patient presents with   Respiratory Distress    Brief Narrative:    Radiah Lubinski is a pleasant 71 y.o. female with medical history significant for hypertension, type 2 diabetes mellitus, depression, fibromyalgia, and remote history of heart catheterization without stents, now presenting to the emergency department with shortness of breath and chest discomfort.  She was noted with elevated blood pressure 220/130, and #pulmonary edema, for which she required BiPAP, she was admitted for further work-up.     Assessment & Plan:   Principal Problem:   Acute respiratory failure with hypoxia (HCC) Active Problems:   Hypothyroidism   Diabetes mellitus (HCC)   Hypertension   Depression   Elevated troponin   Acute CHF (congestive heart failure) (HCC)    Acute systolic/diastolic CHF acute hypoxic respiratory failure  flash pulmonary edema - Pt woke with acute respiratory distress and chest discomfort, was found by EMS to have BP 220/130 and O2 sat 65%, and was given 3 doses NTG and started on CPAP pta  - BP normalized with NTG, chest discomfort resolved, and respiratory status improved dramatically  - BNP is elevated to 271 and acute CHF findings noted on CXR  -2D echo significant for low EF 30 to 35%, with global hypokinesis, and grade 2 diastolic dysfunction -Cardiac cath with no significant CAD. -Per cardiology, she is currently on losartan 25 mg p.o. daily, attempt Entresto as an outpatient - Started on Toprol succinate 25 mg oral daily - Aldactone increased to 25 mg oral daily. -Started on Farxiga -IV diuresis per cardiology, so far tolerating 40 mg IV twice daily.  Hypertension  -Please see above discussion, blood pressure controlled on losartan, Toprol succinate and Aldactone  Hypokalemia -  repleted, started on Aldactone as well   Type II DM  hold - A1c is 6.7 -CBG controlled on low intensity insulin sliding scale   Depression; PTSD   - Continue Wellbutrin, Effexor, Minipress   Hypothyroidism -Continue with Synthroid    DVT prophylaxis: Lovenox Code Status: Full Family Communication: None nat bedside Disposition:   Status is: Inpatient    Consultants:  Cardiology   Subjective:  He denies any chest pain, no dyspnea or palpitation overnight..  Objective: Vitals:   07/22/21 0422 07/22/21 0635 07/22/21 0816 07/22/21 1142  BP: 126/76  126/69 115/64  Pulse: 98  97 99  Resp: 11 16 20 18   Temp: 98.6 F (37 C)  98.2 F (36.8 C) 98.2 F (36.8 C)  TempSrc: Oral  Oral Oral  SpO2: 97%  93% 97%  Weight:  101.3 kg    Height:        Intake/Output Summary (Last 24 hours) at 07/22/2021 1301 Last data filed at 07/22/2021 0816 Gross per 24 hour  Intake 480 ml  Output --  Net 480 ml   Filed Weights   07/20/21 1714 07/21/21 0419 07/22/21 0635  Weight: 107.1 kg 107.3 kg 101.3 kg    Examination:  Awake Alert, Oriented X 3, No new F.N deficits, Normal affect Symmetrical Chest wall movement, Good air movement bilaterally, CTAB RRR,No Gallops,Rubs or new Murmurs, No Parasternal Heave +ve B.Sounds, Abd Soft, No tenderness, No rebound - guarding or rigidity. No Cyanosis, Clubbing or edema, No new Rash or bruise       Data Reviewed: I have personally reviewed following labs and imaging studies  CBC: Recent Labs  Lab 07/20/21 0024  PROGRESS NOTE    Tiki Tucciarone  ZJI:[id number] DOB: [birth year] DOA: 07/20/2021 PCP: Quitman Livings, MD   Chief Complaint  Patient presents with   Respiratory Distress    Brief Narrative:    Radiah Lubinski is a pleasant 71 y.o. female with medical history significant for hypertension, type 2 diabetes mellitus, depression, fibromyalgia, and remote history of heart catheterization without stents, now presenting to the emergency department with shortness of breath and chest discomfort.  She was noted with elevated blood pressure 220/130, and #pulmonary edema, for which she required BiPAP, she was admitted for further work-up.     Assessment & Plan:   Principal Problem:   Acute respiratory failure with hypoxia (HCC) Active Problems:   Hypothyroidism   Diabetes mellitus (HCC)   Hypertension   Depression   Elevated troponin   Acute CHF (congestive heart failure) (HCC)    Acute systolic/diastolic CHF acute hypoxic respiratory failure  flash pulmonary edema - Pt woke with acute respiratory distress and chest discomfort, was found by EMS to have BP 220/130 and O2 sat 65%, and was given 3 doses NTG and started on CPAP pta  - BP normalized with NTG, chest discomfort resolved, and respiratory status improved dramatically  - BNP is elevated to 271 and acute CHF findings noted on CXR  -2D echo significant for low EF 30 to 35%, with global hypokinesis, and grade 2 diastolic dysfunction -Cardiac cath with no significant CAD. -Per cardiology, she is currently on losartan 25 mg p.o. daily, attempt Entresto as an outpatient - Started on Toprol succinate 25 mg oral daily - Aldactone increased to 25 mg oral daily. -Started on Farxiga -IV diuresis per cardiology, so far tolerating 40 mg IV twice daily.  Hypertension  -Please see above discussion, blood pressure controlled on losartan, Toprol succinate and Aldactone  Hypokalemia -  repleted, started on Aldactone as well   Type II DM  hold - A1c is 6.7 -CBG controlled on low intensity insulin sliding scale   Depression; PTSD   - Continue Wellbutrin, Effexor, Minipress   Hypothyroidism -Continue with Synthroid    DVT prophylaxis: Lovenox Code Status: Full Family Communication: None nat bedside Disposition:   Status is: Inpatient    Consultants:  Cardiology   Subjective:  He denies any chest pain, no dyspnea or palpitation overnight..  Objective: Vitals:   07/22/21 0422 07/22/21 0635 07/22/21 0816 07/22/21 1142  BP: 126/76  126/69 115/64  Pulse: 98  97 99  Resp: 11 16 20 18   Temp: 98.6 F (37 C)  98.2 F (36.8 C) 98.2 F (36.8 C)  TempSrc: Oral  Oral Oral  SpO2: 97%  93% 97%  Weight:  101.3 kg    Height:        Intake/Output Summary (Last 24 hours) at 07/22/2021 1301 Last data filed at 07/22/2021 0816 Gross per 24 hour  Intake 480 ml  Output --  Net 480 ml   Filed Weights   07/20/21 1714 07/21/21 0419 07/22/21 0635  Weight: 107.1 kg 107.3 kg 101.3 kg    Examination:  Awake Alert, Oriented X 3, No new F.N deficits, Normal affect Symmetrical Chest wall movement, Good air movement bilaterally, CTAB RRR,No Gallops,Rubs or new Murmurs, No Parasternal Heave +ve B.Sounds, Abd Soft, No tenderness, No rebound - guarding or rigidity. No Cyanosis, Clubbing or edema, No new Rash or bruise       Data Reviewed: I have personally reviewed following labs and imaging studies  CBC: Recent Labs  Lab 07/20/21 0024

## 2021-07-22 NOTE — Progress Notes (Signed)
Progress Note  Patient Name: Stacey Fletcher Date of Encounter: 07/22/2021  Primary Cardiologist: Lance Muss, MD   Subjective   Overnight widely patent CORS, reviewed with patient post Cath. Patient notes that she feels ok.   No CP, SOB, Palpitations. Diuretic is working  Inpatient Medications    Scheduled Meds:  aspirin  81 mg Oral Daily   buPROPion  300 mg Oral q morning   furosemide  40 mg Intravenous BID   gabapentin  100 mg Oral QHS   insulin aspart  0-5 Units Subcutaneous QHS   insulin aspart  0-6 Units Subcutaneous TID WC   levothyroxine  25 mcg Oral QAC breakfast   losartan  25 mg Oral Daily   potassium chloride SA  20 mEq Oral Daily   potassium chloride  40 mEq Oral Q6H   pravastatin  20 mg Oral q AM   sodium chloride flush  3 mL Intravenous Q12H   sodium chloride flush  3 mL Intravenous Q12H   sodium chloride flush  3 mL Intravenous Q12H   spironolactone  12.5 mg Oral Daily   venlafaxine XR  75 mg Oral Q breakfast   Continuous Infusions:  sodium chloride     PRN Meds: sodium chloride, acetaminophen, morphine injection, ondansetron **OR** ondansetron (ZOFRAN) IV, senna-docusate, sodium chloride flush   Vital Signs    Vitals:   07/21/21 2335 07/22/21 0422 07/22/21 0635 07/22/21 0816  BP: 128/84 126/76  126/69  Pulse: (!) 109 98  97  Resp: 18 11 16 20   Temp: 98.4 F (36.9 C) 98.6 F (37 C)  98.2 F (36.8 C)  TempSrc: Oral Oral  Oral  SpO2: 100% 97%  93%  Weight:   101.3 kg   Height:        Intake/Output Summary (Last 24 hours) at 07/22/2021 0935 Last data filed at 07/22/2021 0816 Gross per 24 hour  Intake 480 ml  Output --  Net 480 ml   Filed Weights   07/20/21 1714 07/21/21 0419 07/22/21 0635  Weight: 107.1 kg 107.3 kg 101.3 kg    Telemetry    SR to sinus tachycardia - Personally Reviewed  Physical Exam   Gen: No distress Neck: No JVD,  Cardiac: No Rubs or Gallops, soft systolic Murmur, RRR +2 radial pulses Respiratory:  Clear to auscultation bilaterally, normal effort, normal  respiratory rate GI: Soft, nontender, non-distended  MS: No  edema;  moves all extremities Integument: Skin feels cool not cold Neuro:  At time of evaluation, alert and oriented to person/place/time/situation  Psych: Normal affect, patient feels well   Labs    Chemistry Recent Labs  Lab 07/20/21 0457 07/21/21 0207 07/21/21 1417 07/21/21 1421 07/22/21 0057  NA 139 139 141  141 141 136  K 3.4* 3.2* 3.7  3.6 3.6 3.2*  CL 102 104  --   --  101  CO2 26 25  --   --  27  GLUCOSE 136* 154*  --   --  126*  BUN 14 16  --   --  14  CREATININE 0.95 1.09*  --   --  0.98  CALCIUM 8.6* 8.9  --   --  9.2  GFRNONAA >60 54*  --   --  >60  ANIONGAP 11 10  --   --  8     Hematology Recent Labs  Lab 07/20/21 0457 07/21/21 0207 07/21/21 1417 07/21/21 1421 07/22/21 0057  WBC 8.7 8.1  --   --  7.7  RBC 4.24 4.12  --   --  4.51  HGB 10.7* 10.2* 11.9*  11.9* 12.2 11.2*  HCT 35.0* 33.4* 35.0*  35.0* 36.0 36.3  MCV 82.5 81.1  --   --  80.5  MCH 25.2* 24.8*  --   --  24.8*  MCHC 30.6 30.5  --   --  30.9  RDW 16.3* 16.1*  --   --  16.2*  PLT 236 227  --   --  223    Cardiac EnzymesNo results for input(s): "TROPONINI" in the last 168 hours. No results for input(s): "TROPIPOC" in the last 168 hours.   BNP Recent Labs  Lab 07/20/21 0024  BNP 270.5*     DDimer No results for input(s): "DDIMER" in the last 168 hours.   Radiology    CARDIAC CATHETERIZATION  Result Date: 07/21/2021 Images from the original result were not included. Stacey Fletcher is a 71 y.o. female  [id number] LOCATION:  FACILITY: [facility] PHYSICIAN: Nanetta Batty, M.D. 1950/11/04 DATE OF PROCEDURE:  07/21/2021 DATE OF DISCHARGE: CARDIAC CATHETERIZATION History obtained from chart review.71 y.o. female sudden onset SOB with new HF.  Her troponins were mildly elevated but flat.  Her EKG showed no acute changes.  Her 2D echo revealed EF of 30 to 35% without valvular  abnormalities.  She was referred for right left heart cath with define her anatomy and physiology. HEMODYNAMICS: 1: Right atrial pressure-14/9, mean 9 2: Right ventricular pressure-50/7 3: Pulmonary artery pressure-48/27, mean 36 4: Pulmonary wedge pressure-A-wave 35, V wave 43, mean 36 5: LVEDP-33 6: Cardiac output-5.39 L/min with an index of 2.46 L/min/m    Stacey Fletcher has clean coronary arteries with severe LV dysfunction and elevated filling pressures.  She requires additional diuresis and guideline directed optimal medical therapy.  I reviewed her anatomy and physiology with her attending cardiologist, Dr. Izora Ribas.  Her radial sheath was removed and a TR band was placed on the right wrist to achieve patent hemostasis.  The patient left lab in stable condition. Nanetta Batty. MD, [facility] 07/21/2021 2:35 PM    ECHOCARDIOGRAM COMPLETE  Result Date: 07/20/2021    ECHOCARDIOGRAM REPORT   Patient Name:   Stacey Fletcher Date of Exam: 07/20/2021 Medical Rec #:  [id number]        Height:       68.0 in Accession #:    [id number]       Weight:       238.0 lb Date of Birth:  Sep 12, 1950        BSA:          2.201 m Patient Age:    70 years         BP:           120/75 mmHg Patient Gender: F                HR:           85 bpm. Exam Location:  Inpatient Procedure: 2D Echo, Color Doppler, Cardiac Doppler and Intracardiac            Opacification Agent Indications:    R06.9 DOE, Elevated Troponins  History:        Patient has no prior history of Echocardiogram examinations.                 CHF, CAD; Risk Factors:Hypertension and Diabetes.  Sonographer:    Irving Burton Senior RDCS Referring Phys: [id number] Stacey Fletcher  Sonographer Comments: Technically difficult study due to  lung interference from COPD. IMPRESSIONS  1. Left ventricular ejection fraction, by estimation, is 30 to 35%. The left ventricle has moderately decreased function. The left ventricle demonstrates global hypokinesis. There is mild concentric left  ventricular hypertrophy. Left ventricular diastolic parameters are consistent with Grade II diastolic dysfunction (pseudonormalization).  2. Right ventricular systolic function is mildly reduced. The right ventricular size is normal.  3. Left atrial size was mildly dilated.  4. The mitral valve is normal in structure. Mild mitral valve regurgitation. No evidence of mitral stenosis.  5. The aortic valve is normal in structure. Aortic valve regurgitation is not visualized. No aortic stenosis is present.  6. The inferior vena cava is normal in size with greater than 50% respiratory variability, suggesting right atrial pressure of 3 mmHg. Comparison(s): No prior Echocardiogram. FINDINGS  Left Ventricle: Left ventricular ejection fraction, by estimation, is 30 to 35%. The left ventricle has moderately decreased function. The left ventricle demonstrates global hypokinesis. Definity contrast agent was given IV to delineate the left ventricular endocardial borders. The left ventricular internal cavity size was normal in size. There is mild concentric left ventricular hypertrophy. Left ventricular diastolic parameters are consistent with Grade II diastolic dysfunction (pseudonormalization). Right Ventricle: The right ventricular size is normal. No increase in right ventricular wall thickness. Right ventricular systolic function is mildly reduced. Left Atrium: Left atrial size was mildly dilated. Right Atrium: Right atrial size was normal in size. Pericardium: There is no evidence of pericardial effusion. Mitral Valve: The mitral valve is normal in structure. Mild mitral valve regurgitation. No evidence of mitral valve stenosis. Tricuspid Valve: The tricuspid valve is normal in structure. Tricuspid valve regurgitation is not demonstrated. No evidence of tricuspid stenosis. Aortic Valve: The aortic valve is normal in structure. Aortic valve regurgitation is not visualized. No aortic stenosis is present. Pulmonic Valve: The  pulmonic valve was normal in structure. Pulmonic valve regurgitation is not visualized. No evidence of pulmonic stenosis. Aorta: The aortic root is normal in size and structure. Venous: The inferior vena cava is normal in size with greater than 50% respiratory variability, suggesting right atrial pressure of 3 mmHg. IAS/Shunts: No atrial level shunt detected by color flow Doppler.  LEFT VENTRICLE PLAX 2D LVIDd:         7.00 cm   Diastology LVIDs:         6.00 cm   LV e' medial:    4.03 cm/s LV PW:         1.10 cm   LV E/e' medial:  24.8 LV IVS:        0.90 cm   LV e' lateral:   6.74 cm/s LVOT diam:     2.20 cm   LV E/e' lateral: 14.8 LV SV:         50 LV SV Index:   23 LVOT Area:     3.80 cm  RIGHT VENTRICLE RV S prime:     9.25 cm/s TAPSE (M-mode): 1.6 cm LEFT ATRIUM             Index        RIGHT ATRIUM           Index LA diam:        4.10 cm 1.86 cm/m   RA Area:     20.90 cm LA Vol (A2C):   79.2 ml 35.99 ml/m  RA Volume:   58.40 ml  26.54 ml/m LA Vol (A4C):   80.7 ml 36.67 ml/m LA Biplane Vol: 84.9  ml 38.58 ml/m  AORTIC VALVE LVOT Vmax:   77.70 cm/s LVOT Vmean:  60.200 cm/s LVOT VTI:    0.132 m  AORTA Ao Root diam: 3.20 cm MITRAL VALVE MV Area (PHT): 4.01 cm    SHUNTS MV Decel Time: 189 msec    Systemic VTI:  0.13 m MV E velocity: 99.80 cm/s  Systemic Diam: 2.20 cm MV A velocity: 75.80 cm/s MV E/A ratio:  1.32 Kardie Tobb DO Electronically signed by Thomasene Ripple DO Signature Date/Time: 07/20/2021/2:48:01 PM    Final      Patient Profile     71 y.o. female sudden onset SOB with new HF   Assessment & Plan    Acute hypoxic respitatory failure HFrEF Chronicity unknown HTN with DM, initial suspicion of of HTN emergency Falsh pulmonary edema Former Tobacco Use - on losartan 25 mg PO daily; outpatient will attempt to get on Entresto 24/26 - aldactone increase to 25 mg with low K (repleted) - Succinate 25 mg PO daily  - she has allergies listed to sitagliptin (itching) and had a UTI of  empaglaflozin;amen able to re-challenge today: adding farxiga today  Potential 07/23/21 discharge if TRH is in agreement HF impact clinic 08/02/21 Then f/u with me or [place] in 6-8 weeks   For questions or updates, please contact [facility] Please consult [email or web address] for contact info under Cardiology/STEMI.      Riley Lam, MD Cardiologist Hypertrophic Cardiomyopathy [facility]  [address] [place], [address] [place], [place] [address] [phone]  9:35 AM

## 2021-07-22 NOTE — Progress Notes (Signed)
CSW acknowledges consult for SNF/HH. The patient will require PT/OT evaluations. TOC will assist with disposition planning once the evaluations have been completed.  °  °TOC will continue to follow.    °

## 2021-07-23 LAB — CBC
HCT: 39.7 % (ref 36.0–46.0)
Hemoglobin: 12.5 g/dL (ref 12.0–15.0)
MCH: 25.4 pg — ABNORMAL LOW (ref 26.0–34.0)
MCHC: 31.5 g/dL (ref 30.0–36.0)
MCV: 80.5 fL (ref 80.0–100.0)
Platelets: 241 10*3/uL (ref 150–400)
RBC: 4.93 MIL/uL (ref 3.87–5.11)
RDW: 16.2 % — ABNORMAL HIGH (ref 11.5–15.5)
WBC: 8.6 10*3/uL (ref 4.0–10.5)
nRBC: 0 % (ref 0.0–0.2)

## 2021-07-23 LAB — GLUCOSE, CAPILLARY
Glucose-Capillary: 148 mg/dL — ABNORMAL HIGH (ref 70–99)
Glucose-Capillary: 89 mg/dL (ref 70–99)

## 2021-07-23 LAB — BASIC METABOLIC PANEL
Anion gap: 13 (ref 5–15)
BUN: 18 mg/dL (ref 8–23)
CO2: 27 mmol/L (ref 22–32)
Calcium: 10.2 mg/dL (ref 8.9–10.3)
Chloride: 99 mmol/L (ref 98–111)
Creatinine, Ser: 1.14 mg/dL — ABNORMAL HIGH (ref 0.44–1.00)
GFR, Estimated: 51 mL/min — ABNORMAL LOW (ref >60)
Glucose, Bld: 128 mg/dL — ABNORMAL HIGH (ref 70–99)
Potassium: 4.1 mmol/L (ref 3.5–5.1)
Sodium: 139 mmol/L (ref 135–145)

## 2021-07-23 MED ORDER — FUROSEMIDE 40 MG PO TABS
40.0000 mg | ORAL_TABLET | Freq: Every day | ORAL | Status: DC
  Administered 2021-07-23: 40 mg via ORAL
  Filled 2021-07-23: qty 1

## 2021-07-23 MED ORDER — LOSARTAN POTASSIUM 25 MG PO TABS: 25.0000 mg | ORAL_TABLET | Freq: Every day | ORAL | 1 refills | Status: DC

## 2021-07-23 MED ORDER — METFORMIN HCL 1000 MG PO TABS: 1000.0000 mg | ORAL_TABLET | Freq: Two times a day (BID) | ORAL | 0 refills | Status: DC

## 2021-07-23 MED ORDER — DAPAGLIFLOZIN PROPANEDIOL 10 MG PO TABS: 10.0000 mg | ORAL_TABLET | Freq: Every day | ORAL | 1 refills | Status: DC

## 2021-07-23 MED ORDER — METOPROLOL SUCCINATE ER 50 MG PO TB24
50.0000 mg | ORAL_TABLET | Freq: Every day | ORAL | Status: DC
  Administered 2021-07-23: 50 mg via ORAL
  Filled 2021-07-23: qty 1

## 2021-07-23 MED ORDER — RESTASIS 0.05 % OP EMUL: 1.0000 [drp] | OPHTHALMIC | 1 refills | Status: DC

## 2021-07-23 MED ORDER — SPIRONOLACTONE 25 MG PO TABS: 25.0000 mg | ORAL_TABLET | Freq: Every day | ORAL | 1 refills | Status: DC

## 2021-07-23 MED ORDER — FUROSEMIDE 40 MG PO TABS: 40.0000 mg | ORAL_TABLET | Freq: Every day | ORAL | 1 refills | Status: DC

## 2021-07-23 MED ORDER — METOPROLOL SUCCINATE ER 50 MG PO TB24: 50.0000 mg | ORAL_TABLET | Freq: Every day | ORAL | 1 refills | Status: DC

## 2021-07-23 NOTE — Care Management (Addendum)
    Durable Medical Equipment  (From admission, onward)           Start     Ordered   07/23/21 1338  For home use only DME Bedside commode  Once       Question:  Patient needs a bedside commode to treat with the following condition  Answer:  Weakness   07/23/21 1337

## 2021-07-23 NOTE — Progress Notes (Signed)
Received referral to assist with DME (3-in-BSC). [facility] accepted the referral.

## 2021-07-23 NOTE — Discharge Instructions (Signed)
Follow with Primary MD Quitman Livings, MD in 7 days   Get CBC, CMP, checked  by Primary MD next visit.    Activity: As tolerated with Full fall precautions use walker/cane & assistance as needed   Disposition Home    Diet: Heart Healthy . For Heart failure patients - Check your Weight same time everyday, if you gain over 2 pounds, or you develop in leg swelling, experience more shortness of breath or chest pain, call your Primary MD immediately. Follow Cardiac Low Salt Diet and 1.5 lit/day fluid restriction.   On your next visit with your primary care physician please Get Medicines reviewed and adjusted.   Please request your Prim.MD to go over all Hospital Tests and Procedure/Radiological results at the follow up, please get all Hospital records sent to your Prim MD by signing hospital release before you go home.   If you experience worsening of your admission symptoms, develop shortness of breath, life threatening emergency, suicidal or homicidal thoughts you must seek medical attention immediately by calling 911 or calling your MD immediately  if symptoms less severe.  You Must read complete instructions/literature along with all the possible adverse reactions/side effects for all the Medicines you take and that have been prescribed to you. Take any new Medicines after you have completely understood and accpet all the possible adverse reactions/side effects.   Do not drive, operating heavy machinery, perform activities at heights, swimming or participation in water activities or provide baby sitting services if your were admitted for syncope or siezures until you have seen by Primary MD or a Neurologist and advised to do so again.  Do not drive when taking Pain medications.    Do not take more than prescribed Pain, Sleep and Anxiety Medications  Special Instructions: If you have smoked or chewed Tobacco  in the last 2 yrs please stop smoking, stop any regular Alcohol  and or any  Recreational drug use.  Wear Seat belts while driving.   Please note  You were cared for by a hospitalist during your hospital stay. If you have any questions about your discharge medications or the care you received while you were in the hospital after you are discharged, you can call the unit and asked to speak with the hospitalist on call if the hospitalist that took care of you is not available. Once you are discharged, your primary care physician will handle any further medical issues. Please note that NO REFILLS for any discharge medications will be authorized once you are discharged, as it is imperative that you return to your primary care physician (or establish a relationship with a primary care physician if you do not have one) for your aftercare needs so that they can reassess your need for medications and monitor your lab values.

## 2021-07-23 NOTE — Progress Notes (Signed)
Progress Note  Patient Name: Stacey Fletcher Date of Encounter: 07/23/2021  Primary Cardiologist: Lance Muss, MD   Subjective   Patient notes that she feels ok.   No CP, SOB, Palpitations.  I/Os not well document. No itching on SGLT2i.  Inpatient Medications    Scheduled Meds:  aspirin  81 mg Oral Daily   buPROPion  300 mg Oral q morning   dapagliflozin propanediol  10 mg Oral Daily   furosemide  40 mg Intravenous BID   gabapentin  100 mg Oral QHS   insulin aspart  0-5 Units Subcutaneous QHS   insulin aspart  0-6 Units Subcutaneous TID WC   levothyroxine  25 mcg Oral QAC breakfast   losartan  25 mg Oral Daily   metoprolol succinate  25 mg Oral Daily   potassium chloride SA  20 mEq Oral Daily   pravastatin  20 mg Oral q AM   sodium chloride flush  3 mL Intravenous Q12H   sodium chloride flush  3 mL Intravenous Q12H   sodium chloride flush  3 mL Intravenous Q12H   spironolactone  25 mg Oral Daily   venlafaxine XR  75 mg Oral Q breakfast   Continuous Infusions:  sodium chloride     PRN Meds: sodium chloride, acetaminophen, morphine injection, ondansetron **OR** ondansetron (ZOFRAN) IV, senna-docusate, sodium chloride flush   Vital Signs    Vitals:   07/22/21 1142 07/22/21 1705 07/22/21 2139 07/23/21 0428  BP: 115/64 123/85 118/76 116/89  Pulse: 99 100 94 87  Resp: 18 18 14 16   Temp: 98.2 F (36.8 C) 98.2 F (36.8 C) 98.4 F (36.9 C) 98.4 F (36.9 C)  TempSrc: Oral Oral Oral Oral  SpO2: 97% 98% 97% 98%  Weight:    102.9 kg  Height:        Intake/Output Summary (Last 24 hours) at 07/23/2021 0749 Last data filed at 07/22/2021 1800 Gross per 24 hour  Intake 600 ml  Output --  Net 600 ml   Filed Weights   07/21/21 0419 07/22/21 0635 07/23/21 0428  Weight: 107.3 kg 101.3 kg 102.9 kg    Telemetry    AM P-SVT otherwise SR - Personally Reviewed  Physical Exam   Gen: No distress Neck: No JVD,  Cardiac: No Rubs or Gallops, soft systolic  murmur, RRR +2 radial pulses no bruit or hematoma R radial Respiratory: Clear to auscultation bilaterally, normal effort, normal  respiratory rate GI: Soft, nontender, non-distended  MS: No  edema;  moves all extremities Integument: Skin feels cool not cold Neuro:  At time of evaluation, alert and oriented to person/place/time/situation  Psych: Normal affect, patient feels well   Labs    Chemistry Recent Labs  Lab 07/21/21 0207 07/21/21 1417 07/21/21 1421 07/22/21 0057 07/23/21 0231  NA 139   < > 141 136 139  K 3.2*   < > 3.6 3.2* 4.1  CL 104  --   --  101 99  CO2 25  --   --  27 27  GLUCOSE 154*  --   --  126* 128*  BUN 16  --   --  14 18  CREATININE 1.09*  --   --  0.98 1.14*  CALCIUM 8.9  --   --  9.2 10.2  GFRNONAA 54*  --   --  >60 51*  ANIONGAP 10  --   --  8 13   < > = values in this interval not displayed.  Hematology Recent Labs  Lab 07/21/21 0207 07/21/21 1417 07/21/21 1421 07/22/21 0057 07/23/21 0231  WBC 8.1  --   --  7.7 8.6  RBC 4.12  --   --  4.51 4.93  HGB 10.2*   < > 12.2 11.2* 12.5  HCT 33.4*   < > 36.0 36.3 39.7  MCV 81.1  --   --  80.5 80.5  MCH 24.8*  --   --  24.8* 25.4*  MCHC 30.5  --   --  30.9 31.5  RDW 16.1*  --   --  16.2* 16.2*  PLT 227  --   --  223 241   < > = values in this interval not displayed.    Cardiac EnzymesNo results for input(s): "TROPONINI" in the last 168 hours. No results for input(s): "TROPIPOC" in the last 168 hours.   BNP Recent Labs  Lab 07/20/21 0024  BNP 270.5*     DDimer No results for input(s): "DDIMER" in the last 168 hours.   Radiology    CARDIAC CATHETERIZATION  Result Date: 07/21/2021 Images from the original result were not included. Stacey Fletcher is a 71 y.o. female  [id number] LOCATION:  FACILITY: [facility] PHYSICIAN: Nanetta Batty, M.D. 04/11/1950 DATE OF PROCEDURE:  07/21/2021 DATE OF DISCHARGE: CARDIAC CATHETERIZATION History obtained from chart review.71 y.o. female sudden onset SOB with  new HF.  Her troponins were mildly elevated but flat.  Her EKG showed no acute changes.  Her 2D echo revealed EF of 30 to 35% without valvular abnormalities.  She was referred for right left heart cath with define her anatomy and physiology. HEMODYNAMICS: 1: Right atrial pressure-14/9, mean 9 2: Right ventricular pressure-50/7 3: Pulmonary artery pressure-48/27, mean 36 4: Pulmonary wedge pressure-A-wave 35, V wave 43, mean 36 5: LVEDP-33 6: Cardiac output-5.39 L/min with an index of 2.46 L/min/m    Ms. Wiggers has clean coronary arteries with severe LV dysfunction and elevated filling pressures.  She requires additional diuresis and guideline directed optimal medical therapy.  I reviewed her anatomy and physiology with her attending cardiologist, Dr. Izora Ribas.  Her radial sheath was removed and a TR band was placed on the right wrist to achieve patent hemostasis.  The patient left lab in stable condition. Nanetta Batty. MD, [facility] 07/21/2021 2:35 PM      Patient Profile     71 y.o. female sudden onset SOB with new HF   Assessment & Plan    Acute hypoxic respitatory failure HFrEF Chronicity unknown HTN with DM, initial suspicion of of HTN emergency [place] pulmonary edema Former Tobacco Use - lasix 40 mg PO daily - on losartan 25 mg PO daily; outpatient will attempt to get on Entresto 24/26 - aldactone 25 mg Po daily - Succinate 50 mg PO daily  - On Faxiga 10, we did UTI education   Potential discharge today if TRH is in agreement HF impact clinic 08/02/21 Then f/u with me or [place] in 6-8 weeks DC tenoretic on DC She is 48 hours post cath, metformin could be resumed 6/19 if [facility] with TRH  For questions or updates, please contact [facility] Please consult [email or web address] for contact info under Cardiology/STEMI.      Riley Lam, MD Cardiologist Hypertrophic Cardiomyopathy [facility]  [address] [place], [address] [place], [place] [address] [phone]  7:49 AM

## 2021-07-23 NOTE — Progress Notes (Signed)
Pt discharged to home with family.  Pt taken off telemetry and CCMD notified.  Pt's IV's removed.  Pt left with all of their personal belongings. AVS documentation reviwed and sent home with Pt and all questions answered.

## 2021-07-23 NOTE — Progress Notes (Signed)
Per MD request I have sent a message to our office's scheduling team requesting a follow-up appointment in 6-8 weeks, and our office will call the patient with this information.

## 2021-07-23 NOTE — Discharge Summary (Signed)
flat.  Her EKG showed no acute changes.  Her 2D echo revealed EF of 30 to 35% without valvular abnormalities.  She was referred for right left heart cath with define her anatomy and physiology. HEMODYNAMICS: 1: Right atrial pressure-14/9, mean 9 2: Right ventricular pressure-50/7 3: Pulmonary artery pressure-48/27, mean 36 4: Pulmonary wedge pressure-A-wave 35, V wave 43, mean 36 5: LVEDP-33 6: Cardiac output-5.39 L/min with an index of 2.46 L/min/m    Ms. Mizzell has clean coronary arteries with severe LV dysfunction and elevated filling pressures.  She requires additional diuresis and guideline directed optimal medical therapy.  I reviewed her anatomy and physiology with her attending cardiologist, Dr. Izora Ribas.  Her radial sheath was removed and a TR band was placed on the right wrist to achieve patent hemostasis.  The patient left lab in stable condition. Nanetta Batty. MD, [facility] 07/21/2021 2:35 PM    ECHOCARDIOGRAM COMPLETE  Result Date: 07/20/2021    ECHOCARDIOGRAM REPORT   Patient Name:   Stacey Fletcher Date of Exam: 07/20/2021 Medical Rec #:  [id number]        Height:       68.0 in Accession #:    [id number]       Weight:       238.0 lb Date of Birth:  1950-10-31        BSA:          2.201 m Patient Age:    71 years         BP:           120/75 mmHg Patient Gender: F                HR:            85 bpm. Exam Location:  Inpatient Procedure: 2D Echo, Color Doppler, Cardiac Doppler and Intracardiac            Opacification Agent Indications:    R06.9 DOE, Elevated Troponins  History:        Patient has no prior history of Echocardiogram examinations.                 CHF, CAD; Risk Factors:Hypertension and Diabetes.  Sonographer:    Irving Burton Senior RDCS Referring Phys: [id number] TIMOTHY S OPYD  Sonographer Comments: Technically difficult study due to lung interference from COPD. IMPRESSIONS  1. Left ventricular ejection fraction, by estimation, is 30 to 35%. The left ventricle has moderately decreased function. The left ventricle demonstrates global hypokinesis. There is mild concentric left ventricular hypertrophy. Left ventricular diastolic parameters are consistent with Grade II diastolic dysfunction (pseudonormalization).  2. Right ventricular systolic function is mildly reduced. The right ventricular size is normal.  3. Left atrial size was mildly dilated.  4. The mitral valve is normal in structure. Mild mitral valve regurgitation. No evidence of mitral stenosis.  5. The aortic valve is normal in structure. Aortic valve regurgitation is not visualized. No aortic stenosis is present.  6. The inferior vena cava is normal in size with greater than 50% respiratory variability, suggesting right atrial pressure of 3 mmHg. Comparison(s): No prior Echocardiogram. FINDINGS  Left Ventricle: Left ventricular ejection fraction, by estimation, is 30 to 35%. The left ventricle has moderately decreased function. The left ventricle demonstrates global hypokinesis. Definity contrast agent was given IV to delineate the left ventricular endocardial borders. The left ventricular internal cavity size was normal in size. There is mild concentric left ventricular hypertrophy. Left ventricular diastolic parameters are consistent with Grade  Physician Discharge Summary  Stacey Fletcher PIR:[id number] DOB: 12-16-1950 DOA: 07/20/2021  PCP: Quitman Livings, MD  Admit date: 07/20/2021 Discharge date: 07/23/2021  Admitted From: Home Disposition:  Home   Recommendations for Outpatient Follow-up:  Follow up with PCP in 1-2 weeks Please obtain BMP/CBC in one week Patient to follow-up with cardiology as an outpatient   Home Health:NO  Discharge Condition:Stable CODE STATUS:FULL Diet recommendation: Heart Healthy / Carb Modified    Brief/Interim Summary:  Stacey Fletcher is a pleasant 71 y.o. female with medical history significant for hypertension, type 2 diabetes mellitus, depression, fibromyalgia, and remote history of heart catheterization without stents, now presenting to the emergency department with shortness of breath and chest discomfort.  She was noted with elevated blood pressure 220/130, and #pulmonary edema, for which she required BiPAP, she was admitted for further work-up.    Acute systolic/diastolic CHF acute hypoxic respiratory failure  flash pulmonary edema - Pt woke with acute respiratory distress and chest discomfort, was found by EMS to have BP 220/130 and O2 sat 65%, and was given 3 doses NTG, she required BiPAP initially, respiratory failure has resolved, she has been on room air for a few days. - BP normalized with NTG, chest discomfort resolved, and respiratory status improved dramatically  - BNP is elevated to 271 and acute CHF findings noted on CXR  -2D echo significant for low EF 30 to 35%, with global hypokinesis, and grade 2 diastolic dysfunction -Cardiac cath with no significant CAD. -She received IV Lasix during hospital stay, she is a euvolemic at time of discharge -[facility]up [facility] on 08/[facility]/2021, and to follow with primary cardiologist in 6 to 8 weeks. -Management per cardiology medication has been adjusted, please see final recommendation below - lasix 40 mg PO daily - on losartan  25 mg PO daily; outpatient will attempt to get on Entresto 24/26 - aldactone 25 mg Po daily - Succinate 50 mg PO daily  - On Faxiga 10, we did UTI education       Hypertension  -Please see above discussion,    Hypokalemia -  repleted, started on Aldactone as well    Type II DM hold - A1c is 6.7 Room home regimen on discharge, [name] was added for CHF   Depression; PTSD   - Continue Wellbutrin, Effexor, Minipress    Hypothyroidism -Continue with Synthroid    Discharge Diagnoses:  Principal Problem:   Acute respiratory failure with hypoxia (HCC) Active Problems:   Hypothyroidism   Diabetes mellitus (HCC)   Hypertension   Depression   Elevated troponin   Acute CHF (congestive heart failure) (HCC)    Discharge Instructions  Discharge Instructions     Diet - low sodium heart healthy   Complete by: As directed    Discharge instructions   Complete by: As directed    Follow with Primary MD Quitman Livings, MD in 7 days   Get CBC, CMP, checked  by Primary MD next visit.    Activity: As tolerated with Full fall precautions use walker/cane & assistance as needed   Disposition Home    Diet: Heart Healthy . For Heart failure patients - Check your Weight same time everyday, if you gain over 2 pounds, or you develop in leg swelling, experience more shortness of breath or chest pain, call your Primary MD immediately. Follow Cardiac Low Salt Diet and 1.5 lit/day fluid restriction.   On your next visit with your primary care physician please Get Medicines reviewed and adjusted.  flat.  Her EKG showed no acute changes.  Her 2D echo revealed EF of 30 to 35% without valvular abnormalities.  She was referred for right left heart cath with define her anatomy and physiology. HEMODYNAMICS: 1: Right atrial pressure-14/9, mean 9 2: Right ventricular pressure-50/7 3: Pulmonary artery pressure-48/27, mean 36 4: Pulmonary wedge pressure-A-wave 35, V wave 43, mean 36 5: LVEDP-33 6: Cardiac output-5.39 L/min with an index of 2.46 L/min/m    Ms. Mizzell has clean coronary arteries with severe LV dysfunction and elevated filling pressures.  She requires additional diuresis and guideline directed optimal medical therapy.  I reviewed her anatomy and physiology with her attending cardiologist, Dr. Izora Ribas.  Her radial sheath was removed and a TR band was placed on the right wrist to achieve patent hemostasis.  The patient left lab in stable condition. Nanetta Batty. MD, [facility] 07/21/2021 2:35 PM    ECHOCARDIOGRAM COMPLETE  Result Date: 07/20/2021    ECHOCARDIOGRAM REPORT   Patient Name:   Stacey Fletcher Date of Exam: 07/20/2021 Medical Rec #:  [id number]        Height:       68.0 in Accession #:    [id number]       Weight:       238.0 lb Date of Birth:  1950-10-31        BSA:          2.201 m Patient Age:    71 years         BP:           120/75 mmHg Patient Gender: F                HR:            85 bpm. Exam Location:  Inpatient Procedure: 2D Echo, Color Doppler, Cardiac Doppler and Intracardiac            Opacification Agent Indications:    R06.9 DOE, Elevated Troponins  History:        Patient has no prior history of Echocardiogram examinations.                 CHF, CAD; Risk Factors:Hypertension and Diabetes.  Sonographer:    Irving Burton Senior RDCS Referring Phys: [id number] TIMOTHY S OPYD  Sonographer Comments: Technically difficult study due to lung interference from COPD. IMPRESSIONS  1. Left ventricular ejection fraction, by estimation, is 30 to 35%. The left ventricle has moderately decreased function. The left ventricle demonstrates global hypokinesis. There is mild concentric left ventricular hypertrophy. Left ventricular diastolic parameters are consistent with Grade II diastolic dysfunction (pseudonormalization).  2. Right ventricular systolic function is mildly reduced. The right ventricular size is normal.  3. Left atrial size was mildly dilated.  4. The mitral valve is normal in structure. Mild mitral valve regurgitation. No evidence of mitral stenosis.  5. The aortic valve is normal in structure. Aortic valve regurgitation is not visualized. No aortic stenosis is present.  6. The inferior vena cava is normal in size with greater than 50% respiratory variability, suggesting right atrial pressure of 3 mmHg. Comparison(s): No prior Echocardiogram. FINDINGS  Left Ventricle: Left ventricular ejection fraction, by estimation, is 30 to 35%. The left ventricle has moderately decreased function. The left ventricle demonstrates global hypokinesis. Definity contrast agent was given IV to delineate the left ventricular endocardial borders. The left ventricular internal cavity size was normal in size. There is mild concentric left ventricular hypertrophy. Left ventricular diastolic parameters are consistent with Grade  flat.  Her EKG showed no acute changes.  Her 2D echo revealed EF of 30 to 35% without valvular abnormalities.  She was referred for right left heart cath with define her anatomy and physiology. HEMODYNAMICS: 1: Right atrial pressure-14/9, mean 9 2: Right ventricular pressure-50/7 3: Pulmonary artery pressure-48/27, mean 36 4: Pulmonary wedge pressure-A-wave 35, V wave 43, mean 36 5: LVEDP-33 6: Cardiac output-5.39 L/min with an index of 2.46 L/min/m    Ms. Mizzell has clean coronary arteries with severe LV dysfunction and elevated filling pressures.  She requires additional diuresis and guideline directed optimal medical therapy.  I reviewed her anatomy and physiology with her attending cardiologist, Dr. Izora Ribas.  Her radial sheath was removed and a TR band was placed on the right wrist to achieve patent hemostasis.  The patient left lab in stable condition. Nanetta Batty. MD, [facility] 07/21/2021 2:35 PM    ECHOCARDIOGRAM COMPLETE  Result Date: 07/20/2021    ECHOCARDIOGRAM REPORT   Patient Name:   Stacey Fletcher Date of Exam: 07/20/2021 Medical Rec #:  [id number]        Height:       68.0 in Accession #:    [id number]       Weight:       238.0 lb Date of Birth:  1950-10-31        BSA:          2.201 m Patient Age:    71 years         BP:           120/75 mmHg Patient Gender: F                HR:            85 bpm. Exam Location:  Inpatient Procedure: 2D Echo, Color Doppler, Cardiac Doppler and Intracardiac            Opacification Agent Indications:    R06.9 DOE, Elevated Troponins  History:        Patient has no prior history of Echocardiogram examinations.                 CHF, CAD; Risk Factors:Hypertension and Diabetes.  Sonographer:    Irving Burton Senior RDCS Referring Phys: [id number] TIMOTHY S OPYD  Sonographer Comments: Technically difficult study due to lung interference from COPD. IMPRESSIONS  1. Left ventricular ejection fraction, by estimation, is 30 to 35%. The left ventricle has moderately decreased function. The left ventricle demonstrates global hypokinesis. There is mild concentric left ventricular hypertrophy. Left ventricular diastolic parameters are consistent with Grade II diastolic dysfunction (pseudonormalization).  2. Right ventricular systolic function is mildly reduced. The right ventricular size is normal.  3. Left atrial size was mildly dilated.  4. The mitral valve is normal in structure. Mild mitral valve regurgitation. No evidence of mitral stenosis.  5. The aortic valve is normal in structure. Aortic valve regurgitation is not visualized. No aortic stenosis is present.  6. The inferior vena cava is normal in size with greater than 50% respiratory variability, suggesting right atrial pressure of 3 mmHg. Comparison(s): No prior Echocardiogram. FINDINGS  Left Ventricle: Left ventricular ejection fraction, by estimation, is 30 to 35%. The left ventricle has moderately decreased function. The left ventricle demonstrates global hypokinesis. Definity contrast agent was given IV to delineate the left ventricular endocardial borders. The left ventricular internal cavity size was normal in size. There is mild concentric left ventricular hypertrophy. Left ventricular diastolic parameters are consistent with Grade  BY MOUTH AT BEDTIME What changed: See the new instructions.   glipiZIDE 10 MG 24 hr tablet Commonly known as: GLUCOTROL XL Take 10 mg by mouth 2 (two) times daily. Lunch & supper   HYDROcodone-acetaminophen 5-325 MG tablet Commonly known as: NORCO/VICODIN Take 1 tablet by mouth 4 (four) times daily as needed for moderate pain.   levothyroxine 25 MCG tablet Commonly known as: SYNTHROID Take 25 mcg by mouth daily before breakfast.   losartan 25 MG tablet Commonly known as: COZAAR Take 1 tablet (25 mg total) by mouth daily. Start taking on: July 24, 2021   metFORMIN 1000 MG tablet Commonly known as: GLUCOPHAGE Take 1,000 mg by mouth 2 (two) times daily.   metoprolol succinate 50 MG 24 hr tablet Commonly known as: TOPROL-XL Take 1 tablet (50 mg total) by mouth daily. Take with or immediately following a meal. Start taking on: July 24, 2021   Ozempic  (0.25 or 0.5 MG/DOSE) 2 MG/1.5ML Sopn Generic drug: Semaglutide(0.25 or 0.5MG /DOS) Inject 0.25 mg into the skin once a week. Injects every Wednesday   polyethylene glycol 17 g packet Commonly known as: MIRALAX / GLYCOLAX Take 17 g by mouth daily as needed for moderate constipation or mild constipation.   Potassium Chloride ER 20 MEQ Tbcr Take 1 tablet by mouth daily.   pravastatin 20 MG tablet Commonly known as: PRAVACHOL Take 20 mg by mouth in the morning.   Restasis 0.05 % ophthalmic emulsion Generic drug: cycloSPORINE Place 1 drop into both eyes 2 (two) times a week.   spironolactone 25 MG tablet Commonly known as: ALDACTONE Take 1 tablet (25 mg total) by mouth daily. Start taking on: July 24, 2021   venlafaxine XR 75 MG 24 hr capsule Commonly known as: EFFEXOR-XR Take 75 mg by mouth in the morning, at noon, and at bedtime.        Follow-up Information     [facility] Follow up.   Specialty: Cardiology Contact information: [address] [id number] [address] [place] [address] [phone]        [facility] Follow up.   Specialty: Cardiology Why: Hospital follow up on Wednesday Aug 02, 2021 2:00 PM PLEASE bring a current medication list to appointment FREE valet parking, Entrance C, off of [employer] Contact information: [address] [id number] [address] [place] [address] [phone]        [facility] [address] Office Follow up.   Specialty: Cardiology Why: [facility] office will call you with additional cardiology follow-up visit as well Contact information: [address], [address] [place] [address] [phone]               Allergies  Allergen Reactions   Prednisone Shortness Of Breath and Rash   Ace Inhibitors Other (See Comments)    Dizzy headaches, crawling feeling inside of arm    Empagliflozin Other (See Comments)    uti/yeast infection   Sitagliptin Itching   Procardia [Nifedipine] Palpitations    "heart attack symptoms"    Consultations: Cardiology   Procedures/Studies: CARDIAC CATHETERIZATION  Result Date: 07/21/2021 Images from the original result were not included. Esli Clements is a 71 y.o. female  [id number] LOCATION:  FACILITY: [facility] PHYSICIAN: Nanetta Batty, M.D. 10/07/1950 DATE OF PROCEDURE:  07/21/2021 DATE OF DISCHARGE: CARDIAC CATHETERIZATION History obtained from chart review.71 y.o. female sudden onset SOB with new HF.  Her troponins were mildly elevated but  Physician Discharge Summary  Stacey Fletcher PIR:[id number] DOB: 12-16-1950 DOA: 07/20/2021  PCP: Quitman Livings, MD  Admit date: 07/20/2021 Discharge date: 07/23/2021  Admitted From: Home Disposition:  Home   Recommendations for Outpatient Follow-up:  Follow up with PCP in 1-2 weeks Please obtain BMP/CBC in one week Patient to follow-up with cardiology as an outpatient   Home Health:NO  Discharge Condition:Stable CODE STATUS:FULL Diet recommendation: Heart Healthy / Carb Modified    Brief/Interim Summary:  Stacey Fletcher is a pleasant 71 y.o. female with medical history significant for hypertension, type 2 diabetes mellitus, depression, fibromyalgia, and remote history of heart catheterization without stents, now presenting to the emergency department with shortness of breath and chest discomfort.  She was noted with elevated blood pressure 220/130, and #pulmonary edema, for which she required BiPAP, she was admitted for further work-up.    Acute systolic/diastolic CHF acute hypoxic respiratory failure  flash pulmonary edema - Pt woke with acute respiratory distress and chest discomfort, was found by EMS to have BP 220/130 and O2 sat 65%, and was given 3 doses NTG, she required BiPAP initially, respiratory failure has resolved, she has been on room air for a few days. - BP normalized with NTG, chest discomfort resolved, and respiratory status improved dramatically  - BNP is elevated to 271 and acute CHF findings noted on CXR  -2D echo significant for low EF 30 to 35%, with global hypokinesis, and grade 2 diastolic dysfunction -Cardiac cath with no significant CAD. -She received IV Lasix during hospital stay, she is a euvolemic at time of discharge -[facility] on 08/[facility]/2021, and to follow with primary cardiologist in 6 to 8 weeks. -Management per cardiology medication has been adjusted, please see final recommendation below - lasix 40 mg PO daily - on losartan  25 mg PO daily; outpatient will attempt to get on Entresto 24/26 - aldactone 25 mg Po daily - Succinate 50 mg PO daily  - On Faxiga 10, we did UTI education       Hypertension  -Please see above discussion,    Hypokalemia -  repleted, started on Aldactone as well    Type II DM hold - A1c is 6.7 Room home regimen on discharge, [name] was added for CHF   Depression; PTSD   - Continue Wellbutrin, Effexor, Minipress    Hypothyroidism -Continue with Synthroid    Discharge Diagnoses:  Principal Problem:   Acute respiratory failure with hypoxia (HCC) Active Problems:   Hypothyroidism   Diabetes mellitus (HCC)   Hypertension   Depression   Elevated troponin   Acute CHF (congestive heart failure) (HCC)    Discharge Instructions  Discharge Instructions     Diet - low sodium heart healthy   Complete by: As directed    Discharge instructions   Complete by: As directed    Follow with Primary MD Quitman Livings, MD in 7 days   Get CBC, CMP, checked  by Primary MD next visit.    Activity: As tolerated with Full fall precautions use walker/cane & assistance as needed   Disposition Home    Diet: Heart Healthy . For Heart failure patients - Check your Weight same time everyday, if you gain over 2 pounds, or you develop in leg swelling, experience more shortness of breath or chest pain, call your Primary MD immediately. Follow Cardiac Low Salt Diet and 1.5 lit/day fluid restriction.   On your next visit with your primary care physician please Get Medicines reviewed and adjusted.  flat.  Her EKG showed no acute changes.  Her 2D echo revealed EF of 30 to 35% without valvular abnormalities.  She was referred for right left heart cath with define her anatomy and physiology. HEMODYNAMICS: 1: Right atrial pressure-14/9, mean 9 2: Right ventricular pressure-50/7 3: Pulmonary artery pressure-48/27, mean 36 4: Pulmonary wedge pressure-A-wave 35, V wave 43, mean 36 5: LVEDP-33 6: Cardiac output-5.39 L/min with an index of 2.46 L/min/m    Ms. Mizzell has clean coronary arteries with severe LV dysfunction and elevated filling pressures.  She requires additional diuresis and guideline directed optimal medical therapy.  I reviewed her anatomy and physiology with her attending cardiologist, Dr. Izora Ribas.  Her radial sheath was removed and a TR band was placed on the right wrist to achieve patent hemostasis.  The patient left lab in stable condition. Nanetta Batty. MD, [facility] 07/21/2021 2:35 PM    ECHOCARDIOGRAM COMPLETE  Result Date: 07/20/2021    ECHOCARDIOGRAM REPORT   Patient Name:   Stacey Fletcher Date of Exam: 07/20/2021 Medical Rec #:  [id number]        Height:       68.0 in Accession #:    [id number]       Weight:       238.0 lb Date of Birth:  1950-10-31        BSA:          2.201 m Patient Age:    71 years         BP:           120/75 mmHg Patient Gender: F                HR:            85 bpm. Exam Location:  Inpatient Procedure: 2D Echo, Color Doppler, Cardiac Doppler and Intracardiac            Opacification Agent Indications:    R06.9 DOE, Elevated Troponins  History:        Patient has no prior history of Echocardiogram examinations.                 CHF, CAD; Risk Factors:Hypertension and Diabetes.  Sonographer:    Irving Burton Senior RDCS Referring Phys: [id number] TIMOTHY S OPYD  Sonographer Comments: Technically difficult study due to lung interference from COPD. IMPRESSIONS  1. Left ventricular ejection fraction, by estimation, is 30 to 35%. The left ventricle has moderately decreased function. The left ventricle demonstrates global hypokinesis. There is mild concentric left ventricular hypertrophy. Left ventricular diastolic parameters are consistent with Grade II diastolic dysfunction (pseudonormalization).  2. Right ventricular systolic function is mildly reduced. The right ventricular size is normal.  3. Left atrial size was mildly dilated.  4. The mitral valve is normal in structure. Mild mitral valve regurgitation. No evidence of mitral stenosis.  5. The aortic valve is normal in structure. Aortic valve regurgitation is not visualized. No aortic stenosis is present.  6. The inferior vena cava is normal in size with greater than 50% respiratory variability, suggesting right atrial pressure of 3 mmHg. Comparison(s): No prior Echocardiogram. FINDINGS  Left Ventricle: Left ventricular ejection fraction, by estimation, is 30 to 35%. The left ventricle has moderately decreased function. The left ventricle demonstrates global hypokinesis. Definity contrast agent was given IV to delineate the left ventricular endocardial borders. The left ventricular internal cavity size was normal in size. There is mild concentric left ventricular hypertrophy. Left ventricular diastolic parameters are consistent with Grade

## 2021-07-24 ENCOUNTER — Encounter (HOSPITAL_COMMUNITY): Payer: Self-pay | Admitting: Cardiovascular Disease

## 2021-07-24 LAB — LIPOPROTEIN A (LPA): Lipoprotein (a): 61.5 nmol/L — ABNORMAL HIGH (ref <75.0)

## 2021-08-02 ENCOUNTER — Inpatient Hospital Stay (HOSPITAL_COMMUNITY): Admit: 2021-08-02 | Discharge: 2021-08-02 | Disposition: A | Discharge status: Home / Self Care | Payer: Medicare Other
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
## 2021-08-10 ENCOUNTER — Telehealth (HOSPITAL_COMMUNITY): Payer: Self-pay | Admitting: *Deleted

## 2021-08-10 ENCOUNTER — Encounter (HOSPITAL_COMMUNITY): Payer: Self-pay

## 2021-08-10 ENCOUNTER — Ambulatory Visit (HOSPITAL_COMMUNITY)
Admission: RE | Admit: 2021-08-10 | Discharge: 2021-08-10 | Disposition: A | Discharge status: Home / Self Care | Payer: Medicare Other | Source: Ambulatory Visit | Attending: Physician Assistant | Admitting: Physician Assistant

## 2021-08-10 VITALS — BP 118/76 | HR 95 | Wt 228.4 lb

## 2021-08-10 DIAGNOSIS — I110 Hypertensive heart disease with heart failure: Secondary | ICD-10-CM | POA: Insufficient documentation

## 2021-08-10 DIAGNOSIS — Z8249 Family history of ischemic heart disease and other diseases of the circulatory system: Secondary | ICD-10-CM | POA: Diagnosis not present

## 2021-08-10 DIAGNOSIS — Z833 Family history of diabetes mellitus: Secondary | ICD-10-CM | POA: Insufficient documentation

## 2021-08-10 DIAGNOSIS — Z823 Family history of stroke: Secondary | ICD-10-CM | POA: Diagnosis not present

## 2021-08-10 DIAGNOSIS — M170 Bilateral primary osteoarthritis of knee: Secondary | ICD-10-CM | POA: Diagnosis not present

## 2021-08-10 DIAGNOSIS — M797 Fibromyalgia: Secondary | ICD-10-CM | POA: Diagnosis not present

## 2021-08-10 DIAGNOSIS — I428 Other cardiomyopathies: Secondary | ICD-10-CM | POA: Insufficient documentation

## 2021-08-10 DIAGNOSIS — F32A Depression, unspecified: Secondary | ICD-10-CM | POA: Insufficient documentation

## 2021-08-10 DIAGNOSIS — I10 Essential (primary) hypertension: Secondary | ICD-10-CM | POA: Diagnosis not present

## 2021-08-10 DIAGNOSIS — Z87891 Personal history of nicotine dependence: Secondary | ICD-10-CM | POA: Insufficient documentation

## 2021-08-10 DIAGNOSIS — I5020 Unspecified systolic (congestive) heart failure: Secondary | ICD-10-CM | POA: Diagnosis not present

## 2021-08-10 DIAGNOSIS — I2510 Atherosclerotic heart disease of native coronary artery without angina pectoris: Secondary | ICD-10-CM | POA: Diagnosis not present

## 2021-08-10 DIAGNOSIS — Z79890 Hormone replacement therapy (postmenopausal): Secondary | ICD-10-CM | POA: Insufficient documentation

## 2021-08-10 DIAGNOSIS — E039 Hypothyroidism, unspecified: Secondary | ICD-10-CM | POA: Diagnosis not present

## 2021-08-10 DIAGNOSIS — I252 Old myocardial infarction: Secondary | ICD-10-CM | POA: Diagnosis not present

## 2021-08-10 DIAGNOSIS — I5022 Chronic systolic (congestive) heart failure: Secondary | ICD-10-CM | POA: Diagnosis not present

## 2021-08-10 DIAGNOSIS — Z79899 Other long term (current) drug therapy: Secondary | ICD-10-CM | POA: Diagnosis not present

## 2021-08-10 DIAGNOSIS — R0683 Snoring: Secondary | ICD-10-CM | POA: Insufficient documentation

## 2021-08-10 DIAGNOSIS — Z7984 Long term (current) use of oral hypoglycemic drugs: Secondary | ICD-10-CM | POA: Insufficient documentation

## 2021-08-10 DIAGNOSIS — E119 Type 2 diabetes mellitus without complications: Secondary | ICD-10-CM | POA: Diagnosis not present

## 2021-08-10 LAB — COMPREHENSIVE METABOLIC PANEL
ALT: 14 U/L (ref 0–44)
AST: 22 U/L (ref 15–41)
Albumin: 3.8 g/dL (ref 3.5–5.0)
Alkaline Phosphatase: 96 U/L (ref 38–126)
Anion gap: 7 (ref 5–15)
BUN: 24 mg/dL — ABNORMAL HIGH (ref 8–23)
CO2: 28 mmol/L (ref 22–32)
Calcium: 9.4 mg/dL (ref 8.9–10.3)
Chloride: 107 mmol/L (ref 98–111)
Creatinine, Ser: 1.11 mg/dL — ABNORMAL HIGH (ref 0.44–1.00)
GFR, Estimated: 53 mL/min — ABNORMAL LOW (ref >60)
Glucose, Bld: 82 mg/dL (ref 70–99)
Potassium: 4.1 mmol/L (ref 3.5–5.1)
Sodium: 142 mmol/L (ref 135–145)
Total Bilirubin: 0.6 mg/dL (ref 0.3–1.2)
Total Protein: 6.3 g/dL — ABNORMAL LOW (ref 6.5–8.1)

## 2021-08-10 LAB — BRAIN NATRIURETIC PEPTIDE: B Natriuretic Peptide: 150.3 pg/mL — ABNORMAL HIGH (ref 0.0–100.0)

## 2021-08-10 MED ORDER — POTASSIUM CHLORIDE ER 20 MEQ PO TBCR: 1.0000 | EXTENDED_RELEASE_TABLET | ORAL | 0 refills | Status: DC

## 2021-08-10 MED ORDER — FUROSEMIDE 40 MG PO TABS: 40.0000 mg | ORAL_TABLET | ORAL | 0 refills | Status: DC

## 2021-08-10 MED ORDER — ENTRESTO 24-26 MG PO TABS: 1.0000 | ORAL_TABLET | Freq: Two times a day (BID) | ORAL | 11 refills | Status: DC

## 2021-08-10 NOTE — Patient Instructions (Addendum)
Labs done today. We will contact you only if your labs are abnormal.  STOP taking Losartan.   START Entresto 24-26mg  (1 tablet) by mouth 2 times daily.   DECREASE Lasix to 40mg  (1 tablet) by mouth every other day.  DECREASE Potassium to (1 tablet) by mouth every other day.   No other medication changes were made. Please continue all current medications as prescribed.  Your physician recommends that you schedule a follow-up appointment as needed.   If you have any questions or concerns before your next appointment please send a message through [place] or call our office at [phone].    TO LEAVE A MESSAGE FOR THE NURSE SELECT OPTION 2, PLEASE LEAVE A MESSAGE INCLUDING: YOUR NAME DATE OF BIRTH CALL BACK NUMBER REASON FOR CALL**this is important as we prioritize the call backs  YOU WILL RECEIVE A CALL BACK THE SAME DAY AS LONG AS YOU CALL BEFORE 4:00 PM   Do the following things EVERYDAY: Weigh yourself in the morning before breakfast. Write it down and keep it in a log. Take your medicines as prescribed Eat low salt foods--Limit salt (sodium) to 2000 mg per day.  Stay as active as you can everyday Limit all fluids for the day to less than 2 liters   At the [facility], you and your health needs are our priority. As part of our continuing mission to provide you with exceptional heart care, we have created designated Provider Care Teams. These Care Teams include your primary Cardiologist (physician) and Advanced Practice Providers (APPs- Physician Assistants and Nurse Practitioners) who all work together to provide you with the care you need, when you need it.   You may see any of the following providers on your designated Care Team at your next follow up: Dr [phone] Dr Arvilla Meres, NP Carron Curie, Robbie Lis [place], PharmD   Please be sure to bring in all your medications bottles to every appointment.

## 2021-08-10 NOTE — Telephone Encounter (Signed)
Call attempted to confirm [facility] TOC appt 12 noon on 08/10/21. HIPPA appropriate VM left with callback number.    Rhae Hammock, BSN, Scientist, clinical (histocompatibility and immunogenetics) Only

## 2021-08-10 NOTE — Addendum Note (Signed)
Encounter addended by: Andrey Farmer, PA-C on: 08/10/2021 1:54 PM  Actions taken: Clinical Note Signed

## 2021-08-15 ENCOUNTER — Ambulatory Visit (HOSPITAL_COMMUNITY)
Admission: RE | Admit: 2021-08-15 | Discharge: 2021-08-15 | Disposition: A | Discharge status: Home / Self Care | Payer: Medicare Other | Source: Ambulatory Visit | Attending: Cardiology | Admitting: Cardiology

## 2021-08-15 DIAGNOSIS — I5020 Unspecified systolic (congestive) heart failure: Secondary | ICD-10-CM

## 2021-08-15 LAB — BASIC METABOLIC PANEL
Anion gap: 8 (ref 5–15)
BUN: 17 mg/dL (ref 8–23)
CO2: 27 mmol/L (ref 22–32)
Calcium: 9.8 mg/dL (ref 8.9–10.3)
Chloride: 106 mmol/L (ref 98–111)
Creatinine, Ser: 1.11 mg/dL — ABNORMAL HIGH (ref 0.44–1.00)
GFR, Estimated: 53 mL/min — ABNORMAL LOW (ref >60)
Glucose, Bld: 94 mg/dL (ref 70–99)
Potassium: 4.2 mmol/L (ref 3.5–5.1)
Sodium: 141 mmol/L (ref 135–145)

## 2021-08-15 LAB — TSH: TSH: 0.774 u[IU]/mL (ref 0.350–4.500)

## 2021-09-01 NOTE — Progress Notes (Unsigned)
Office Visit    Patient Name: Stacey Fletcher Date of Encounter: 09/04/2021  PCP:  Quitman Livings, MD   [facility]  Cardiologist:  Lance Muss, MD  Advanced Practice Provider:  No care team member to display Electrophysiologist:  None   Chief Complaint    Stacey Fletcher is a 71 y.o. female with a past medical history significant for CAD, depression, diabetes mellitus type 2, fibromyalgia, hypertension presents today for hospital follow-up.  Patient was recently in the ER 6/23 with shortness of breath and chest discomfort.  Blood pressure was elevated at 220/130 and she had pulmonary edema.  She required BiPAP and was admitted for further work-up.  Her oxygen saturation was reported to be 65% when found by EMS.  She was given 3 doses of nitroglycerin and she required BiPAP initially.  Respiratory failure eventually resolved and she was switched to room air.  BP normalized with nitroglycerin, chest discomfort resolved and respiratory status improved dramatically.  BNP was elevated to 271 when she had acute CHF findings noted on CXR.  2D echocardiogram showed low EF of 30 to 35% with global hypokinesis and grade 2 DD.  Cardiac catheterization without any significant CAD.  She received IV Lasix during her hospitalization and she was euvolemic at the time of discharge.  Cardiology saw her inpatient and she was discharged on Lasix 40 mg daily, losartan 25 mg daily (attempt to switch to [facility] 24/26 outpatient), Aldactone 25 mg p.o. daily, metoprolol succinate 50 mg p.o. daily, and Farxiga 10 mg daily.  Today , she presents today and has urticaria on physical exam.  She states that the lesions are very itchy.  She stopped taking her Entresto.  She has been off this medication for about a week.  Otherwise, she is doing well.  She has not had any significant shortness of breath or lower extremity edema.  Blood pressure is well controlled today.  We discussed getting a  repeat echocardiogram in a few months to reassess her ejection fraction.  Last echocardiogram showed EF of 30 to 35% and grade 2 DD.  She is currently on max medical therapy.  Reports no shortness of breath nor dyspnea on exertion. Reports no chest pain, pressure, or tightness. No edema, orthopnea, PND. Reports no palpitations.    Past Medical History    Past Medical History:  Diagnosis Date   Coronary artery disease    Depression    Diabetes mellitus    Fibromyalgia    Goiter    Hypertension    MI (myocardial infarction) (HCC)    Osteoarthritis    Pneumonia    10+ years ago.   Past Surgical History:  Procedure Laterality Date   ABDOMINAL HYSTERECTOMY     BREAST SURGERY     CARDIAC CATHETERIZATION     1995   RIGHT/LEFT HEART CATH AND CORONARY ANGIOGRAPHY N/A 07/21/2021   Procedure: RIGHT/LEFT HEART CATH AND CORONARY ANGIOGRAPHY;  Surgeon: Runell Gess, MD;  Location: [facility] INVASIVE CV LAB;  Service: Cardiovascular;  Laterality: N/A;    Allergies  Allergies  Allergen Reactions   Entresto [Sacubitril-Valsartan] Hives   Prednisone Shortness Of Breath and Rash   Ace Inhibitors Other (See Comments)    Dizzy headaches, crawling feeling inside of arm   Empagliflozin Other (See Comments)    uti/yeast infection   Sitagliptin Itching   Procardia [Nifedipine] Palpitations    "heart attack symptoms"     EKGs/Labs/Other Studies Reviewed:   The following studies were  reviewed today:  07/21/21 Cardiac cath  History obtained from chart review.71 y.o. female sudden onset SOB with new HF.  Her troponins were mildly elevated but flat.  Her EKG showed no acute changes.  Her 2D echo revealed EF of 30 to 35% without valvular abnormalities.  She was referred for right left heart cath with define her anatomy and physiology.     HEMODYNAMICS:    1: Right atrial pressure-14/9, mean 9 2: Right ventricular pressure-50/7 3: Pulmonary artery pressure-48/27, mean 36 4: Pulmonary wedge  pressure-A-wave 35, V wave 43, mean 36 5: LVEDP-33 6: Cardiac output-5.39 L/min with an index of 2.46 L/min/m       IMPRESSION: Stacey Fletcher has clean coronary arteries with severe LV dysfunction and elevated filling pressures.  She requires additional diuresis and guideline directed optimal medical therapy.  I reviewed her anatomy and physiology with her attending cardiologist, Dr. Izora Ribas.  Her radial sheath was removed and a TR band was placed on the right wrist to achieve patent hemostasis.  The patient left lab in stable condition.   Stacey Fletcher. MD, [facility] 07/21/2021  Echocardiogram 07/20/2021  IMPRESSIONS     1. Left ventricular ejection fraction, by estimation, is 30 to 35%. The  left ventricle has moderately decreased function. The left ventricle  demonstrates global hypokinesis. There is mild concentric left ventricular  hypertrophy. Left ventricular  diastolic parameters are consistent with Grade II diastolic dysfunction  (pseudonormalization).   2. Right ventricular systolic function is mildly reduced. The right  ventricular size is normal.   3. Left atrial size was mildly dilated.   4. The mitral valve is normal in structure. Mild mitral valve  regurgitation. No evidence of mitral stenosis.   5. The aortic valve is normal in structure. Aortic valve regurgitation is  not visualized. No aortic stenosis is present.   6. The inferior vena cava is normal in size with greater than 50%  respiratory variability, suggesting right atrial pressure of 3 mmHg.   EKG:  EKG is not ordered today.    Recent Labs: 07/20/2021: Magnesium 1.9 07/23/2021: Hemoglobin 12.5; Platelets 241 08/10/2021: ALT 14; B Natriuretic Peptide 150.3 08/15/2021: BUN 17; Creatinine, Ser 1.11; Potassium 4.2; Sodium 141; TSH 0.774  Recent Lipid Panel    Component Value Date/Time   CHOL 122 03/21/2009 2104   TRIG 77 03/21/2009 2104   HDL 44 03/21/2009 2104   CHOLHDL 2.8 Ratio 03/21/2009 2104   VLDL 15  03/21/2009 2104   LDLCALC 63 03/21/2009 2104     Home Medications   Current Meds  Medication Sig   aspirin 81 MG chewable tablet Chew 81 mg by mouth in the morning.   buPROPion (WELLBUTRIN XL) 300 MG 24 hr tablet Take 300 mg by mouth every morning.   dapagliflozin propanediol (FARXIGA) 10 MG TABS tablet Take 1 tablet (10 mg total) by mouth daily.   furosemide (LASIX) 40 MG tablet Take 1 tablet (40 mg total) by mouth every other day.   furosemide (LASIX) 40 MG tablet Take 40 mg by mouth daily.   gabapentin (NEURONTIN) 100 MG capsule Take 100 mg by mouth as needed (for neropaty).   HYDROcodone-acetaminophen (NORCO/VICODIN) 5-325 MG tablet Take 1 tablet by mouth 4 (four) times daily as needed for moderate pain.   levothyroxine (SYNTHROID) 25 MCG tablet Take 25 mcg by mouth daily before breakfast.   losartan (COZAAR) 25 MG tablet Take 25 mg by mouth daily.   metoprolol succinate (TOPROL-XL) 50 MG 24 hr tablet Take 1  tablet (50 mg total) by mouth daily. Take with or immediately following a meal.   OZEMPIC, 0.25 OR 0.5 MG/DOSE, 2 MG/1.5ML SOPN Inject 0.25 mg into the skin once a week. Injects every Monday   polyethylene glycol (MIRALAX / GLYCOLAX) 17 g packet Take 17 g by mouth daily as needed for moderate constipation or mild constipation.   Potassium Chloride ER 20 MEQ TBCR Take 1 tablet by mouth every other day.   pravastatin (PRAVACHOL) 20 MG tablet Take 20 mg by mouth in the morning.   RESTASIS 0.05 % ophthalmic emulsion Place 1 drop into both eyes 2 (two) times a week.   spironolactone (ALDACTONE) 25 MG tablet Take 1 tablet (25 mg total) by mouth daily.   venlafaxine XR (EFFEXOR-XR) 75 MG 24 hr capsule Take 75 mg by mouth in the morning, at noon, and at bedtime.     Review of Systems      All other systems reviewed and are otherwise negative except as noted above.  Physical Exam    VS:  BP 124/70   Pulse 84   Ht 5\' 8"  (1.727 m)   Wt 224 lb (101.6 kg)   SpO2 99%   BMI 34.06  kg/m  , BMI Body mass index is 34.06 kg/m.  Wt Readings from Last 3 Encounters:  09/04/21 224 lb (101.6 kg)  08/10/21 228 lb 6.4 oz (103.6 kg)  07/23/21 226 lb 13.7 oz (102.9 kg)     GEN: Well nourished, well developed, in no acute distress. HEENT: normal. Neck: Supple, no JVD, carotid bruits, or masses. Cardiac: RRR, no murmurs, rubs, or gallops. No clubbing, cyanosis, edema.  Radials/PT 2+ and equal bilaterally.  Respiratory:  Respirations regular and unlabored, clear to auscultation bilaterally. GI: Soft, nontender, nondistended. MS: No deformity or atrophy. Skin: Urticaria on chest Neuro:  Strength and sensation are intact. Psych: Normal affect.  Assessment & Plan    Entresto allergic reaction -hives present on the patient's chest today -She has been off the medication x 1 week, expect symptoms to improve -Benadryl 50mg  tonight and 25mg  q 6 hours tomorrow -she has a steroid allergy, otherwise I would have given her some hydrocortisone cream   Hypertension -124/70, well controlled today -continue to monitor BP at home -continue current medication regimen.   Acute systolic/diastolic CHF -Continue aspirin 81 mg, Farxiga 10 mg daily, Lasix 40 mg daily, metoprolol succinate 50 mg daily, potassium 20 mEq daily, pravastatin 20 mg daily, spironolactone 25 mg daily, losartan 25 mg daily -echo in Sept  -Follow-up in Oct  Type II DM -Recent hemoglobin A1c 6.7  Hypothyroidism -Recent TSH 0.774 -Continue current Synthroid dose         Disposition: Follow up 3 months with , MD or APP.  Signed, 11-24-1978, PA-C 09/04/2021, 4:37 PM [facility]

## 2021-09-04 ENCOUNTER — Encounter: Payer: Self-pay | Admitting: Physician Assistant

## 2021-09-04 ENCOUNTER — Ambulatory Visit (INDEPENDENT_AMBULATORY_CARE_PROVIDER_SITE_OTHER): Payer: Medicare Other | Admitting: Physician Assistant

## 2021-09-04 VITALS — BP 124/70 | HR 84 | Ht 68.0 in | Wt 224.0 lb

## 2021-09-04 DIAGNOSIS — E119 Type 2 diabetes mellitus without complications: Secondary | ICD-10-CM | POA: Diagnosis not present

## 2021-09-04 DIAGNOSIS — I5020 Unspecified systolic (congestive) heart failure: Secondary | ICD-10-CM

## 2021-09-04 DIAGNOSIS — E038 Other specified hypothyroidism: Secondary | ICD-10-CM

## 2021-09-04 DIAGNOSIS — I428 Other cardiomyopathies: Secondary | ICD-10-CM

## 2021-09-04 DIAGNOSIS — I10 Essential (primary) hypertension: Secondary | ICD-10-CM

## 2021-09-04 DIAGNOSIS — E876 Hypokalemia: Secondary | ICD-10-CM

## 2021-09-04 MED ORDER — METOPROLOL SUCCINATE ER 50 MG PO TB24: 50.0000 mg | ORAL_TABLET | Freq: Every day | ORAL | 3 refills | Status: DC

## 2021-09-04 MED ORDER — DAPAGLIFLOZIN PROPANEDIOL 10 MG PO TABS: 10.0000 mg | ORAL_TABLET | Freq: Every day | ORAL | 3 refills | Status: DC

## 2021-09-04 MED ORDER — LOSARTAN POTASSIUM 25 MG PO TABS: 25.0000 mg | ORAL_TABLET | Freq: Every day | ORAL | 3 refills | Status: DC

## 2021-09-04 MED ORDER — SPIRONOLACTONE 25 MG PO TABS: 25.0000 mg | ORAL_TABLET | Freq: Every day | ORAL | 3 refills | Status: DC

## 2021-09-04 MED ORDER — FUROSEMIDE 40 MG PO TABS: 40.0000 mg | ORAL_TABLET | ORAL | 3 refills | Status: DC

## 2021-09-04 NOTE — Patient Instructions (Addendum)
Medication Instructions:  Your physician has recommended you make the following change in your medication:  1) Take Benadryl 50 mg tonight, then tomorrow take Benadryl 25 mg every 6 hours as needed  *If you need a refill on your cardiac medications before your next appointment, please call your pharmacy*  Testing/Procedures: Your physician has requested that you have an echocardiogram in September 2023. Echocardiography is a painless test that uses sound waves to create images of your heart. It provides your doctor with information about the size and shape of your heart and how well your heart's chambers and valves are working. This procedure takes approximately one hour. There are no restrictions for this procedure.  Follow-Up: At [facility], you and your health needs are our priority.  As part of our continuing mission to provide you with exceptional heart care, we have created designated Provider Care Teams.  These Care Teams include your primary Cardiologist (physician) and Advanced Practice Providers (APPs -  Physician Assistants and Nurse Practitioners) who all work together to provide you with the care you need, when you need it.  Your next appointment:   3 month(s)  The format for your next appointment:   In Person  Provider:   Lance Muss, MD or Jari Favre, PA-C   Important Information About Sugar

## 2021-09-11 ENCOUNTER — Other Ambulatory Visit: Payer: Self-pay | Admitting: Specialist

## 2021-10-23 ENCOUNTER — Ambulatory Visit (HOSPITAL_COMMUNITY): Payer: Medicare Other

## 2021-10-24 ENCOUNTER — Ambulatory Visit (HOSPITAL_COMMUNITY): Payer: Medicare Other | Attending: Physician Assistant

## 2021-10-24 DIAGNOSIS — E038 Other specified hypothyroidism: Secondary | ICD-10-CM | POA: Insufficient documentation

## 2021-10-24 DIAGNOSIS — I5020 Unspecified systolic (congestive) heart failure: Secondary | ICD-10-CM | POA: Diagnosis present

## 2021-10-24 DIAGNOSIS — E876 Hypokalemia: Secondary | ICD-10-CM | POA: Insufficient documentation

## 2021-10-24 DIAGNOSIS — I428 Other cardiomyopathies: Secondary | ICD-10-CM | POA: Insufficient documentation

## 2021-10-24 DIAGNOSIS — E119 Type 2 diabetes mellitus without complications: Secondary | ICD-10-CM | POA: Diagnosis present

## 2021-10-24 DIAGNOSIS — I10 Essential (primary) hypertension: Secondary | ICD-10-CM | POA: Insufficient documentation

## 2021-10-24 LAB — ECHOCARDIOGRAM COMPLETE
Area-P 1/2: 5.79 cm2
S' Lateral: 5.6 cm

## 2021-10-30 ENCOUNTER — Other Ambulatory Visit: Payer: Self-pay | Admitting: *Deleted

## 2021-10-30 ENCOUNTER — Telehealth: Payer: Self-pay | Admitting: Interventional Cardiology

## 2021-10-30 DIAGNOSIS — I340 Nonrheumatic mitral (valve) insufficiency: Secondary | ICD-10-CM

## 2021-10-30 NOTE — Telephone Encounter (Signed)
Patient requested an appointment with Dr. Beau Fanny. She is currently scheduled 10/23. She was asked if she wanted a sooner appointment with an APP, but declined. Patient was added to the waitlist. Received the following message from pt schedule mychart. "If I have severe leakage in my mitral valve, it makes me wonder why no one reached out to me immediately  to inform me. This condition seems important, urgent, & life threatening to me. I am a senior, however, my life still matters to me."

## 2021-10-30 NOTE — Telephone Encounter (Signed)
Left message to call office

## 2021-11-01 ENCOUNTER — Telehealth: Payer: Self-pay

## 2021-11-01 NOTE — Telephone Encounter (Signed)
Per Nicholes Rough, called patient to arrange [employer] consult.  Left message to call back.

## 2021-11-01 NOTE — Telephone Encounter (Signed)
Left message to call back  

## 2021-11-01 NOTE — Telephone Encounter (Signed)
Patient has been notified of results.  See note on echo report

## 2021-11-01 NOTE — Telephone Encounter (Signed)
Spoke with the patient at length. Scheduled her for evaluation of MR with Dr. Ali Lowe on 11/13/2021. She was grateful for call and agrees with plan.

## 2021-11-05 ENCOUNTER — Other Ambulatory Visit (HOSPITAL_COMMUNITY): Payer: Self-pay | Admitting: Physician Assistant

## 2021-11-13 ENCOUNTER — Institutional Professional Consult (permissible substitution): Payer: Medicare Other | Admitting: Internal Medicine

## 2021-11-13 NOTE — Telephone Encounter (Signed)
The patient called back 10/6 and spoke with Stacey Fletcher, who rescheduled her appointment to 10/23.  The patient is scheduled 10/23 with Dr. Irish Fletcher. Called the patient and discussed her appointments. After much discussion, she agrees to cancel the appointment with Dr. Irish Fletcher and keep her appointment with Dr. Ali Fletcher to discuss her leaky mitral valve.  She understands follow-up with Dr. Irish Fletcher will be scheduled pending plan made at consult with Dr. Ali Fletcher. She was grateful for call and agrees with plan.

## 2021-11-26 NOTE — Progress Notes (Unsigned)
Patient ID: Stacey Fletcher MRN: [id number] DOB/AGE: 73/23/1952 73 y.o.  Primary Care Physician:Hassan, Sami, MD Primary Cardiologist: Everette Rank, MD   FOCUSED CARDIOVASCULAR PROBLEM LIST:   1.  Nonischemic cardiomyopathy with ejection fraction of approximately 25 to 30%; intolerant of Entresto, and ACE inhibitors  2.  Moderate to severe mitral regurgitation with restricted posterior leaflet (Carpentier IIIB) 3.  Type 2 diabetes not on insulin 4.  Hypertension 5.  Hyperlipidemia 6.  BMI of 34    HISTORY OF PRESENT ILLNESS: The patient is a 71 y.o. female with the indicated medical history here for recommendations regarding her moderate to severe mitral regurgitation.  Past Medical History:  Diagnosis Date   Coronary artery disease    Depression    Diabetes mellitus    Fibromyalgia    Goiter    Hypertension    MI (myocardial infarction) (HCC)    Osteoarthritis    Pneumonia    10+ years ago.    Past Surgical History:  Procedure Laterality Date   ABDOMINAL HYSTERECTOMY     BREAST SURGERY     CARDIAC CATHETERIZATION     1995   RIGHT/LEFT HEART CATH AND CORONARY ANGIOGRAPHY N/A 07/21/2021   Procedure: RIGHT/LEFT HEART CATH AND CORONARY ANGIOGRAPHY;  Surgeon: Runell Gess, MD;  Location: [facility];  Service: Cardiovascular;  Laterality: N/A;    Family History  Problem Relation Age of Onset   Hypertension Mother    Diabetes Mother    Stroke Mother    Hypertension Other    Diabetes Other     Social History   Socioeconomic History   Marital status: Divorced    Spouse name: Not on file   Number of children: 3   Years of education: Not on file   Highest education level: Bachelor's degree (e.g., BA, AB, BS)  Occupational History   Occupation: retired  Tobacco Use   Smoking status: Former    Types: Cigarettes    Quit date: 07/07/2018    Years since quitting: 3.3   Smokeless tobacco: Never  Vaping Use   Vaping Use: Never used  Substance and  Sexual Activity   Alcohol use: No   Drug use: No   Sexual activity: Not Currently  Other Topics Concern   Not on file  Social History Narrative   Not on file   Social Determinants of Health   Financial Resource Strain: Low Risk  (07/21/2021)   Overall Financial Resource Strain (CARDIA)    Difficulty of Paying Living Expenses: Not very hard  Food Insecurity: No Food Insecurity (07/21/2021)   Hunger Vital Sign    Worried About Running Out of Food in the Last Year: Never true    Ran Out of Food in the Last Year: Never true  Transportation Needs: No Transportation Needs (07/21/2021)   PRAPARE - Administrator, Civil Service (Medical): No    Lack of Transportation (Non-Medical): No  Physical Activity: Not on file  Stress: Not on file  Social Connections: Not on file  Intimate Partner Violence: Not on file     Prior to Admission medications   Medication Sig Start Date End Date Taking? Authorizing Provider  aspirin 81 MG chewable tablet Chew 81 mg by mouth in the morning.    [provider]  buPROPion (WELLBUTRIN XL) 300 MG 24 hr tablet Take 300 mg by mouth every morning. 01/25/20   [provider]  dapagliflozin propanediol (FARXIGA) 10 MG TABS tablet Take 1 tablet (  10 mg total) by mouth daily. 09/04/21   Sharlene Dory, PA-C  furosemide (LASIX) 40 MG tablet Take 40 mg by mouth daily.    [provider]  furosemide (LASIX) 40 MG tablet Take 1 tablet (40 mg total) by mouth every other day. 09/04/21   Sharlene Dory, PA-C  gabapentin (NEURONTIN) 100 MG capsule TAKE 1 CAPSULE(100 MG) BY MOUTH AT BEDTIME 09/11/21   Kerrin Champagne, MD  HYDROcodone-acetaminophen (NORCO/VICODIN) 5-325 MG tablet Take 1 tablet by mouth 4 (four) times daily as needed for moderate pain. 06/23/21   [provider]  levothyroxine (SYNTHROID) 25 MCG tablet Take 25 mcg by mouth daily before breakfast. 12/26/19   [provider]  losartan (COZAAR) 25 MG tablet Take 1  tablet (25 mg total) by mouth daily. 09/04/21   Sharlene Dory, PA-C  metoprolol succinate (TOPROL-XL) 50 MG 24 hr tablet Take 1 tablet (50 mg total) by mouth daily. Take with or immediately following a meal. 09/04/21   Conte, Tessa N, PA-C  OZEMPIC, 0.25 OR 0.5 MG/DOSE, 2 MG/1.5ML SOPN Inject 0.25 mg into the skin once a week. Injects every Monday 01/08/20   [provider]  polyethylene glycol (MIRALAX / GLYCOLAX) 17 g packet Take 17 g by mouth daily as needed for moderate constipation or mild constipation.    [provider]  Potassium Chloride ER 20 MEQ TBCR TAKE 1 TABLET BY MOUTH EVERY OTHER DAY 11/07/21   Corky Crafts, MD  pravastatin (PRAVACHOL) 20 MG tablet Take 20 mg by mouth in the morning. 11/05/18   [provider]  RESTASIS 0.05 % ophthalmic emulsion Place 1 drop into both eyes 2 (two) times a week. 07/24/21   Elgergawy, Leana Roe, MD  spironolactone (ALDACTONE) 25 MG tablet Take 1 tablet (25 mg total) by mouth daily. 09/04/21   Sharlene Dory, PA-C  venlafaxine XR (EFFEXOR-XR) 75 MG 24 hr capsule Take 75 mg by mouth in the morning, at noon, and at bedtime. 02/25/20   [provider]    Allergies  Allergen Reactions   Entresto [Sacubitril-Valsartan] Hives   Prednisone Shortness Of Breath and Rash   Ace Inhibitors Other (See Comments)    Dizzy headaches, crawling feeling inside of arm   Empagliflozin Other (See Comments)    uti/yeast infection   Sitagliptin Itching   Procardia [Nifedipine] Palpitations    "heart attack symptoms"    REVIEW OF SYSTEMS:  General: no fevers/chills/night sweats Eyes: no blurry vision, diplopia, or amaurosis ENT: no sore throat or hearing loss Resp: no cough, wheezing, or hemoptysis CV: no edema or palpitations GI: no abdominal pain, nausea, vomiting, diarrhea, or constipation GU: no dysuria, frequency, or hematuria Skin: no rash Neuro: no headache, numbness, tingling, or weakness of  extremities Musculoskeletal: no joint pain or swelling Heme: no bleeding, DVT, or easy bruising Endo: no polydipsia or polyuria  There were no vitals taken for this visit.  PHYSICAL EXAM: GEN:  AO x 3 in no acute distress HEENT: normal Dentition: Normal*** Neck: JVP normal. +2***carotid upstrokes without bruits. No thyromegaly. Lungs: equal expansion, clear bilaterally CV: Apex is discrete and nondisplaced, RRR without murmur or gallop*** Abd: soft, non-tender, non-distended; no bruit; positive bowel sounds Ext: no edema, ecchymoses, or cyanosis Vascular: 2+ femoral pulses, 2+ radial pulses       Skin: warm and dry without rash Neuro: CN II-XII grossly intact; motor and sensory grossly intact    DATA AND STUDIES:  EKG: June 2023 sinus tachycardia  without left bundle branch block  2D ECHO: September 2023 1. Left ventricular ejection fraction, by estimation, is 20 to 25%. The  left ventricle has severely decreased function. The left ventricle  demonstrates global hypokinesis. The left ventricular internal cavity size  was moderately to severely dilated.  Indeterminate diastolic filling due to E-A fusion.   2. Right ventricular systolic function is normal. The right ventricular  size is normal. Tricuspid regurgitation signal is inadequate for assessing  PA pressure.   3. Left atrial size was moderately dilated.   4. Moderate to severe mitral regurgitation. Restricted movement of the  PMVL (IIIB). The mitral valve is grossly normal. Moderate to severe mitral  valve regurgitation. No evidence of mitral stenosis.   5. The aortic valve is tricuspid. Aortic valve regurgitation is not  visualized. No aortic stenosis is present.   6. The inferior vena cava is normal in size with greater than 50%  respiratory variability, suggesting right atrial pressure of 3 mmHg.   TEE: Not performed  CARDIAC CATH: June 2023 demonstrates no obstructive coronary artery disease, cardiac index of  2.46 L/min, mean wedge pressure of 36 mmHg and V waves to 43 mmHg; mean right atrial pressure of 9 mmHg with LVEDP of 33 mmHg  STS RISK CALCULATOR: Pending  NHYA CLASS: ***    ASSESSMENT AND PLAN:   Severe mitral regurgitation  Type 2 diabetes mellitus without complication, without long-term current use of insulin (HCC)  Hypertension associated with diabetes (HCC)  Hyperlipidemia associated with type 2 diabetes mellitus (HCC)  Nonischemic cardiomyopathy (HCC)  Body mass index (BMI) of 34.0 to 34.9 in adult   I have personally reviewed the patients imaging data as summarized above.  I have reviewed the natural history of mitral regurgitation with the patient and family members who are present today. We have discussed the limitations of medical therapy and the poor prognosis associated with symptomatic mitral regurgitation. We have also reviewed potential treatment options, including palliative medical therapy, conventional mitral surgery, and transcatheter mitral edge-to-edge repair. We discussed treatment options in the context of this patient's specific comorbid medical conditions.   All of the patient's questions were answered today. Will make further recommendations based on the results of studies outlined above.   Total time spent with patient today *** minutes. This includes reviewing records, evaluating the patient and coordinating care.   Orbie Pyo, MD  11/26/2021 9:49 PM    [facility] [address] [place], [place], [place]  [address] Phone: [phone]; Fax: [phone]

## 2021-11-26 NOTE — H&P (View-Only) (Signed)
Patient ID: Stacey Fletcher MRN: [id number] DOB/AGE: 1950/02/07 71 y.o.  Primary Care Physician:Hassan, Sami, MD Primary Cardiologist: Everette Rank, MD   FOCUSED CARDIOVASCULAR PROBLEM LIST:   1.  Nonischemic cardiomyopathy with ejection fraction of approximately 20-25%; intolerant of Entresto and ACE inhibitors  2.  Moderate to severe mitral regurgitation with restricted posterior leaflet (Carpentier IIIB) 3.  Type 2 diabetes not on insulin 4.  Hypertension 5.  Hyperlipidemia 6.  BMI of 34  HISTORY OF PRESENT ILLNESS: The patient is a 71 y.o. female with the indicated medical history here for recommendations regarding her moderate to severe mitral regurgitation.  The patient was admitted in June of this year with acute on chronic systolic heart failure.  She underwent cardiac catheterization and echocardiography which demonstrated no obstructive coronary artery disease, and ejection fraction of 30-35%, and mild mitral regurgitation.  She was discharged on a regimen including Lasix, losartan, Aldactone, Toprol, and [place].  She was trialed on Entresto however developed allergic reaction to this.  Repeat echocardiogram in September demonstrated worsening ejection fraction of 20 to 25% with moderate to severe mitral regurgitation with restricted posterior leaflet.  The patient is here with her son.  The patient moved here in 2001 from [place].  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  She has been doing fairly well up until June.  However she has been plagued with issues with osteoarthritis of her knees.  This has bothered her so much that she is unable to really ambulate on a regular basis.  Everything worsened back in June when she became acutely short of breath.  Since being home she feels well but again does not do much.  She has noticed occasional orthopnea maybe twice with chest fullness and shortness of breath which resolved on its own.  She has fortunately  not required any emergency room visits or hospitalizations.  She denies any presyncope or syncope.  She denies any paroxysmal nocturnal dyspnea or severe shortness of breath.  In terms of her dental health she has upper and lower dentures and if you remaining teeth and she sees a dentist on a regular basis.  Her teeth do not need to be extracted.  Past Medical History:  Diagnosis Date   Coronary artery disease    Depression    Diabetes mellitus    Fibromyalgia    Goiter    Hypertension    MI (myocardial infarction) (HCC)    Osteoarthritis    Pneumonia    10+ years ago.    Past Surgical History:  Procedure Laterality Date   ABDOMINAL HYSTERECTOMY     BREAST SURGERY     CARDIAC CATHETERIZATION     1995   RIGHT/LEFT HEART CATH AND CORONARY ANGIOGRAPHY N/A 07/21/2021   Procedure: RIGHT/LEFT HEART CATH AND CORONARY ANGIOGRAPHY;  Surgeon: Runell Gess, MD;  Location: [facility];  Service: Cardiovascular;  Laterality: N/A;    Family History  Problem Relation Age of Onset   Hypertension Mother    Diabetes Mother    Stroke Mother    Hypertension Other    Diabetes Other     Social History   Socioeconomic History   Marital status: Divorced    Spouse name: Not on file   Number of children: 3   Years of education: Not on file   Highest education level: Bachelor's degree (e.g., BA, AB, BS)  Occupational History   Occupation: retired  Tobacco Use   Smoking status: Former  Types: Cigarettes    Quit date: 07/07/2018    Years since quitting: 3.3   Smokeless tobacco: Never  Vaping Use   Vaping Use: Never used  Substance and Sexual Activity   Alcohol use: No   Drug use: No   Sexual activity: Not Currently  Other Topics Concern   Not on file  Social History Narrative   Not on file   Social Determinants of Health   Financial Resource Strain: Low Risk  (07/21/2021)   Overall Financial Resource Strain (CARDIA)    Difficulty of Paying Living Expenses: Not very hard   Food Insecurity: No Food Insecurity (07/21/2021)   Hunger Vital Sign    Worried About Running Out of Food in the Last Year: Never true    Ran Out of Food in the Last Year: Never true  Transportation Needs: No Transportation Needs (07/21/2021)   PRAPARE - Administrator, Civil Service (Medical): No    Lack of Transportation (Non-Medical): No  Physical Activity: Not on file  Stress: Not on file  Social Connections: Not on file  Intimate Partner Violence: Not on file     Prior to Admission medications   Medication Sig Start Date End Date Taking? Authorizing Provider  aspirin 81 MG chewable tablet Chew 81 mg by mouth in the morning.    [provider]  buPROPion (WELLBUTRIN XL) 300 MG 24 hr tablet Take 300 mg by mouth every morning. 01/25/20   [provider]  dapagliflozin propanediol (FARXIGA) 10 MG TABS tablet Take 1 tablet (10 mg total) by mouth daily. 09/04/21   Sharlene Dory, PA-C  furosemide (LASIX) 40 MG tablet Take 40 mg by mouth daily.    [provider]  furosemide (LASIX) 40 MG tablet Take 1 tablet (40 mg total) by mouth every other day. 09/04/21   Sharlene Dory, PA-C  gabapentin (NEURONTIN) 100 MG capsule TAKE 1 CAPSULE(100 MG) BY MOUTH AT BEDTIME 09/11/21   Kerrin Champagne, MD  HYDROcodone-acetaminophen (NORCO/VICODIN) 5-325 MG tablet Take 1 tablet by mouth 4 (four) times daily as needed for moderate pain. 06/23/21   [provider]  levothyroxine (SYNTHROID) 25 MCG tablet Take 25 mcg by mouth daily before breakfast. 12/26/19   [provider]  losartan (COZAAR) 25 MG tablet Take 1 tablet (25 mg total) by mouth daily. 09/04/21   Sharlene Dory, PA-C  metoprolol succinate (TOPROL-XL) 50 MG 24 hr tablet Take 1 tablet (50 mg total) by mouth daily. Take with or immediately following a meal. 09/04/21   Conte, Tessa N, PA-C  OZEMPIC, 0.25 OR 0.5 MG/DOSE, 2 MG/1.5ML SOPN Inject 0.25 mg into the skin once a week. Injects every Monday  01/08/20   [provider]  polyethylene glycol (MIRALAX / GLYCOLAX) 17 g packet Take 17 g by mouth daily as needed for moderate constipation or mild constipation.    [provider]  Potassium Chloride ER 20 MEQ TBCR TAKE 1 TABLET BY MOUTH EVERY OTHER DAY 11/07/21   Corky Crafts, MD  pravastatin (PRAVACHOL) 20 MG tablet Take 20 mg by mouth in the morning. 11/05/18   [provider]  RESTASIS 0.05 % ophthalmic emulsion Place 1 drop into both eyes 2 (two) times a week. 07/24/21   Elgergawy, Leana Roe, MD  spironolactone (ALDACTONE) 25 MG tablet Take 1 tablet (25 mg total) by mouth daily. 09/04/21   Sharlene Dory, PA-C  venlafaxine XR (EFFEXOR-XR) 75 MG 24 hr capsule Take 75 mg by  mouth in the morning, at noon, and at bedtime. 02/25/20   [provider]    Allergies  Allergen Reactions   Entresto [Sacubitril-Valsartan] Hives   Prednisone Shortness Of Breath and Rash   Ace Inhibitors Other (See Comments)    Dizzy headaches, crawling feeling inside of arm   Empagliflozin Other (See Comments)    uti/yeast infection   Sitagliptin Itching   Procardia [Nifedipine] Palpitations    "heart attack symptoms"    REVIEW OF SYSTEMS:  General: no fevers/chills/night sweats Eyes: no blurry vision, diplopia, or amaurosis ENT: no sore throat or hearing loss Resp: no cough, wheezing, or hemoptysis CV: no edema or palpitations GI: no abdominal pain, nausea, vomiting, diarrhea, or constipation GU: no dysuria, frequency, or hematuria Skin: no rash Neuro: no headache, numbness, tingling, or weakness of extremities Musculoskeletal: no joint pain or swelling Heme: no bleeding, DVT, or easy bruising Endo: no polydipsia or polyuria  BP 112/70 (BP Location: Left Arm, Patient Position: Sitting, Cuff Size: Normal)   Pulse 96   Ht 5\' 8"  (1.727 m)   Wt 225 lb (102.1 kg)   SpO2 97%   BMI 34.21 kg/m   PHYSICAL EXAM: GEN:  AO x 3 in no acute distress HEENT:  normal Dentition: Dentures Neck: JVP normal. +2carotid upstrokes without bruits. No thyromegaly. Lungs: equal expansion, clear bilaterally CV: Apex is discrete and nondisplaced, RRR without murmur or gallop Abd: soft, non-tender, non-distended; no bruit; positive bowel sounds Ext: no edema, ecchymoses, or cyanosis Vascular: 2+ femoral pulses, 2+ radial pulses       Skin: warm and dry without rash Neuro: CN II-XII grossly intact; motor and sensory grossly intact    DATA AND STUDIES:  EKG: June 2023 sinus tachycardia without left bundle branch block  2D ECHO: September 2023 1. Left ventricular ejection fraction, by estimation, is 20 to 25%. The  left ventricle has severely decreased function. The left ventricle  demonstrates global hypokinesis. The left ventricular internal cavity size  was moderately to severely dilated.  Indeterminate diastolic filling due to E-A fusion.   2. Right ventricular systolic function is normal. The right ventricular  size is normal. Tricuspid regurgitation signal is inadequate for assessing  PA pressure.   3. Left atrial size was moderately dilated.   4. Moderate to severe mitral regurgitation. Restricted movement of the  PMVL (IIIB). The mitral valve is grossly normal. Moderate to severe mitral  valve regurgitation. No evidence of mitral stenosis.   5. The aortic valve is tricuspid. Aortic valve regurgitation is not  visualized. No aortic stenosis is present.   6. The inferior vena cava is normal in size with greater than 50%  respiratory variability, suggesting right atrial pressure of 3 mmHg.   TEE: Not performed  CARDIAC CATH: June 2023 demonstrates no obstructive coronary artery disease, cardiac index of 2.46 L/min, mean wedge pressure of 36 mmHg and V waves to 43 mmHg; mean right atrial pressure of 9 mmHg with LVEDP of 33 mmHg  STS RISK CALCULATOR:   Procedure Type: Isolated MVR Perioperative Outcome Estimate % Operative  Mortality 3.2% Morbidity & Mortality 15.7% Stroke 2.37% Renal Failure 2.8% Reoperation 3.69% Prolonged Ventilation 12.9% Deep Sternal Wound Infection 0.079% Long Hospital Stay (>14 days) 9.26% Short Hospital Stay (<6 days)* 16.5%   Procedure Type: Isolated MVr Perioperative Outcome Estimate % Operative Mortality 3.03% Morbidity & Mortality 11.2% Stroke 1.18% Renal Failure 1.32% Reoperation 3.01% Prolonged Ventilation 6.65% Deep Sternal Wound Infection 0.002% Long Hospital Stay (>14 days) 7.21% Short  Hospital Stay (<6 days)* 25.4%  NHYA CLASS: 2  ASSESSMENT AND PLAN:   Severe mitral regurgitation - Plan: MR CARDIAC MORPHOLOGY W WO CONTRAST, CANCELED: MR CARDIAC MORPHOLOGY WO CONTRAST  Type 2 diabetes mellitus without complication, without long-term current use of insulin (HCC)  Hypertension associated with diabetes (HCC)  Hyperlipidemia associated with type 2 diabetes mellitus (HCC)  Nonischemic cardiomyopathy (HCC) - Plan: AMB referral to CHF clinic  Body mass index (BMI) of 34.0 to 34.9 in adult  The patient has developed moderate to severe functional mitral regurgitation due to restricted posterior leaflet.  She does not do enough to elicit symptoms however she has been short of breath at times at rest.  Weight a long conversation about her overall health.  She has a nonischemic cardiomyopathy with an ejection fraction of less than 30%.  She is on adequate medical therapy and is afterload reduced.  She looks to be euvolemic on exam.  She does not have a left bundle branch block.  I will refer her for a TEE to evaluate further to see if she is a candidate for edge-to-edge repair.  Additionally I will refer her to the advanced heart failure division for further recommendations.  Certainly her ejection fraction is less than 30 to 35% and she is on good medical therapy so this may be an indication for a primary prevention ICD at this time.  However I feel that perhaps giving her  an opportunity for left ventricular recovery after edge-to-edge repair may obviate the need for ICD implantation.  I will refer the patient for cardiac MRI especially if she were to need an ICD implantation soon.  We will review the patient's transesophageal echocardiogram and advanced heart failure recommendations in order to formulate a plan moving forward.  I have personally reviewed the patients imaging data as summarized above.  I have reviewed the natural history of mitral regurgitation with the patient and family members who are present today. We have discussed the limitations of medical therapy and the poor prognosis associated with symptomatic mitral regurgitation. We have also reviewed potential treatment options, including palliative medical therapy, conventional mitral surgery, and transcatheter mitral edge-to-edge repair. We discussed treatment options in the context of this patient's specific comorbid medical conditions.   All of the patient's questions were answered today. Will make further recommendations based on the results of studies outlined above.   Total time spent with patient today 60 minutes. This includes reviewing records, evaluating the patient and coordinating care.   Orbie Pyo, MD  11/28/2021 8:22 AM    [facility] [address] [place], [place], [place]  [address] Phone: [phone]; Fax: [phone]

## 2021-11-27 ENCOUNTER — Institutional Professional Consult (permissible substitution): Payer: Medicare Other | Admitting: Internal Medicine

## 2021-11-27 ENCOUNTER — Ambulatory Visit: Payer: Medicare Other | Admitting: Interventional Cardiology

## 2021-11-27 ENCOUNTER — Ambulatory Visit: Payer: Medicare Other | Attending: Internal Medicine | Admitting: Internal Medicine

## 2021-11-27 ENCOUNTER — Encounter: Payer: Self-pay | Admitting: Internal Medicine

## 2021-11-27 VITALS — BP 112/70 | HR 96 | Ht 68.0 in | Wt 225.0 lb

## 2021-11-27 DIAGNOSIS — E1169 Type 2 diabetes mellitus with other specified complication: Secondary | ICD-10-CM | POA: Diagnosis not present

## 2021-11-27 DIAGNOSIS — I428 Other cardiomyopathies: Secondary | ICD-10-CM

## 2021-11-27 DIAGNOSIS — I340 Nonrheumatic mitral (valve) insufficiency: Secondary | ICD-10-CM

## 2021-11-27 DIAGNOSIS — E119 Type 2 diabetes mellitus without complications: Secondary | ICD-10-CM

## 2021-11-27 DIAGNOSIS — E1159 Type 2 diabetes mellitus with other circulatory complications: Secondary | ICD-10-CM

## 2021-11-27 DIAGNOSIS — I152 Hypertension secondary to endocrine disorders: Secondary | ICD-10-CM

## 2021-11-27 DIAGNOSIS — E785 Hyperlipidemia, unspecified: Secondary | ICD-10-CM

## 2021-11-27 DIAGNOSIS — Z6834 Body mass index (BMI) 34.0-34.9, adult: Secondary | ICD-10-CM

## 2021-11-27 NOTE — Patient Instructions (Addendum)
Medication Instructions:  Your physician recommends that you continue on your current medications as directed. Please refer to the Current Medication list given to you today.  *If you need a refill on your cardiac medications before your next appointment, please call your pharmacy*  Lab Work: NONE  Testing/Procedures: Your physician has requested you have a Cardiac MRI performed.   Your physician has requested that you have a TEE. During a TEE, sound waves are used to create images of your heart. It provides your doctor with information about the size and shape of your heart and how well your heart's chambers and valves are working. In this test, a transducer is attached to the end of a flexible tube that's guided down your throat and into your esophagus (the tube leading from you mouth to your stomach) to get a more detailed image of your heart. You are not awake for the procedure. Please see the instruction sheet given to you today. For further information please visit [email or web address].  Follow-Up: Will be determined based on results of TEE.  Other Instructions Your physician has referred you to our [facility], their office will call you to schedule an appointment.  You are scheduled for a TEE on  Friday December 08, 2021 with Dr. Rudean Haskell.  Please arrive at the [facility] (Main Entrance A) at [facility]: [address] [place], [place] [address] at  7:30am.  DIET: Nothing to eat or drink after midnight except a sip of water with medications (see medication instructions below)   FYI: For your safety, and to allow [place] to monitor your vital signs accurately during the surgery/procedure we request that    if you have artificial nails, gel coating, SNS etc. Please have those removed prior to your surgery/procedure. Not having the nail coverings /polish removed may result in cancellation or delay of your surgery/procedure.    Medication Instructions:  Hold  spironolactone and furosemide morning of your procedure.  Continue your anticoagulant:  You will need to continue your anticoagulant after your procedure until you are told by your Provider that it is safe to stop    Labs: Your lab work will be done at the hospital prior to your procedure - you will need to arrive 1  hours ahead of your procedure (you will arrive at 7:30am).  You must have a responsible person to drive you home and stay in the waiting area during your procedure. Failure to do so could result in cancellation.   Bring your insurance cards.   *Special Note: Every effort is made to have your procedure done on time. Occasionally there are emergencies that occur at the hospital that may cause delays. Please be patient if a delay does occur.       Important Information About Sugar

## 2021-12-14 ENCOUNTER — Ambulatory Visit (HOSPITAL_BASED_OUTPATIENT_CLINIC_OR_DEPARTMENT_OTHER): Payer: Medicare Other | Admitting: Anesthesiology

## 2021-12-14 ENCOUNTER — Other Ambulatory Visit: Payer: Self-pay

## 2021-12-14 ENCOUNTER — Encounter (HOSPITAL_COMMUNITY)
Admission: RE | Disposition: A | Discharge status: Home / Self Care | Payer: Self-pay | Source: Home / Self Care | Attending: Internal Medicine

## 2021-12-14 ENCOUNTER — Ambulatory Visit (HOSPITAL_BASED_OUTPATIENT_CLINIC_OR_DEPARTMENT_OTHER)
Admission: RE | Admit: 2021-12-14 | Discharge: 2021-12-14 | Disposition: A | Discharge status: Home / Self Care | Payer: Medicare Other | Source: Ambulatory Visit | Attending: Internal Medicine | Admitting: Internal Medicine

## 2021-12-14 ENCOUNTER — Encounter (HOSPITAL_COMMUNITY): Payer: Self-pay | Admitting: Internal Medicine

## 2021-12-14 ENCOUNTER — Ambulatory Visit (HOSPITAL_COMMUNITY)
Admission: RE | Admit: 2021-12-14 | Discharge: 2021-12-14 | Disposition: A | Discharge status: Home / Self Care | Payer: Medicare Other | Attending: Internal Medicine | Admitting: Internal Medicine

## 2021-12-14 ENCOUNTER — Ambulatory Visit (HOSPITAL_COMMUNITY): Payer: Medicare Other | Admitting: Anesthesiology

## 2021-12-14 DIAGNOSIS — Z87891 Personal history of nicotine dependence: Secondary | ICD-10-CM | POA: Diagnosis not present

## 2021-12-14 DIAGNOSIS — I2510 Atherosclerotic heart disease of native coronary artery without angina pectoris: Secondary | ICD-10-CM | POA: Diagnosis not present

## 2021-12-14 DIAGNOSIS — Z7984 Long term (current) use of oral hypoglycemic drugs: Secondary | ICD-10-CM | POA: Insufficient documentation

## 2021-12-14 DIAGNOSIS — Z7985 Long-term (current) use of injectable non-insulin antidiabetic drugs: Secondary | ICD-10-CM | POA: Diagnosis not present

## 2021-12-14 DIAGNOSIS — I700 Atherosclerosis of aorta: Secondary | ICD-10-CM | POA: Diagnosis not present

## 2021-12-14 DIAGNOSIS — I428 Other cardiomyopathies: Secondary | ICD-10-CM | POA: Insufficient documentation

## 2021-12-14 DIAGNOSIS — I110 Hypertensive heart disease with heart failure: Secondary | ICD-10-CM | POA: Diagnosis not present

## 2021-12-14 DIAGNOSIS — I252 Old myocardial infarction: Secondary | ICD-10-CM

## 2021-12-14 DIAGNOSIS — E119 Type 2 diabetes mellitus without complications: Secondary | ICD-10-CM | POA: Insufficient documentation

## 2021-12-14 DIAGNOSIS — I340 Nonrheumatic mitral (valve) insufficiency: Secondary | ICD-10-CM

## 2021-12-14 DIAGNOSIS — I5022 Chronic systolic (congestive) heart failure: Secondary | ICD-10-CM | POA: Diagnosis not present

## 2021-12-14 DIAGNOSIS — E785 Hyperlipidemia, unspecified: Secondary | ICD-10-CM | POA: Insufficient documentation

## 2021-12-14 DIAGNOSIS — I509 Heart failure, unspecified: Secondary | ICD-10-CM | POA: Diagnosis not present

## 2021-12-14 HISTORY — PX: TEE WITHOUT CARDIOVERSION: SHX5443

## 2021-12-14 HISTORY — PX: BUBBLE STUDY: SHX6837

## 2021-12-14 LAB — GLUCOSE, CAPILLARY: Glucose-Capillary: 112 mg/dL — ABNORMAL HIGH (ref 70–99)

## 2021-12-14 SURGERY — ECHOCARDIOGRAM, TRANSESOPHAGEAL
Anesthesia: General

## 2021-12-14 MED ORDER — PHENYLEPHRINE 80 MCG/ML (10ML) SYRINGE FOR IV PUSH (FOR BLOOD PRESSURE SUPPORT)
PREFILLED_SYRINGE | INTRAVENOUS | Status: DC | PRN
  Administered 2021-12-14: 80 ug via INTRAVENOUS
  Administered 2021-12-14 (×2): 160 ug via INTRAVENOUS

## 2021-12-14 MED ORDER — PROPOFOL 500 MG/50ML IV EMUL
INTRAVENOUS | Status: DC | PRN
  Administered 2021-12-14: 100 ug/kg/min via INTRAVENOUS

## 2021-12-14 MED ORDER — SODIUM CHLORIDE 0.9 % IV SOLN: INTRAVENOUS | Status: DC

## 2021-12-14 MED ORDER — LIDOCAINE 2% (20 MG/ML) 5 ML SYRINGE
INTRAMUSCULAR | Status: DC | PRN
  Administered 2021-12-14: 100 mg via INTRAVENOUS

## 2021-12-14 MED ORDER — PROPOFOL 10 MG/ML IV BOLUS
INTRAVENOUS | Status: DC | PRN
  Administered 2021-12-14 (×2): 30 mg via INTRAVENOUS

## 2021-12-14 NOTE — CV Procedure (Signed)
TRANSESOPHAGEAL ECHOCARDIOGRAM   NAME:  Stacey Fletcher    MRN: [id number] DOB:  Jun 16, 1950    ADMIT DATE: 12/14/2021  INDICATIONS: Mitral regurgitation  PROCEDURE:   Informed consent was obtained prior to the procedure. The risks, benefits and alternatives for the procedure were discussed and the patient comprehended these risks.  Risks include, but are not limited to, cough, sore throat, vomiting, nausea, somnolence, esophageal and stomach trauma or perforation, bleeding, low blood pressure, aspiration, pneumonia, infection, trauma to the teeth and death.    Procedural time out performed. The oropharynx was anesthetized with topical 1% benzocaine.    The patient's heart rate, blood pressure, and oxygen saturation are monitored continuously during the procedure.   The transesophageal probe was inserted in the esophagus and stomach without difficulty and multiple views were obtained.   COMPLICATIONS:    There were no immediate complications.  KEY FINDINGS:  Moderate to severe mitral regurgitation with severe left ventricular dilation and dysfunction. Negative Bubble study.  Full report to follow. Further management per primary team.   Riley Lam, MD [place]  [facility]  10:21 AM

## 2021-12-14 NOTE — Anesthesia Postprocedure Evaluation (Signed)
Anesthesia Post Note  Patient: Stacey Fletcher  Procedure(s) Performed: TRANSESOPHAGEAL ECHOCARDIOGRAM (TEE) BUBBLE STUDY     Patient location during evaluation: PACU Anesthesia Type: General Level of consciousness: awake and alert Pain management: pain level controlled Vital Signs Assessment: post-procedure vital signs reviewed and stable Respiratory status: spontaneous breathing, nonlabored ventilation, respiratory function stable and patient connected to nasal cannula oxygen Cardiovascular status: blood pressure returned to baseline and stable Postop Assessment: no apparent nausea or vomiting Anesthetic complications: no   No notable events documented.  Last Vitals:  Vitals:   12/14/21 1015 12/14/21 1030  BP: 110/70 107/81  Pulse: 76 76  Resp: 12 11  Temp: (!) 36.4 C (!) 36.4 C  SpO2: 94% 94%    Last Pain:  Vitals:   12/14/21 1030  TempSrc:   PainSc: 0-No pain                 Jannah Guardiola

## 2021-12-14 NOTE — Transfer of Care (Signed)
Immediate Anesthesia Transfer of Care Note  Patient: Stacey Fletcher  Procedure(s) Performed: TRANSESOPHAGEAL ECHOCARDIOGRAM (TEE) BUBBLE STUDY  Patient Location: PACU  Anesthesia Type:MAC  Level of Consciousness: drowsy  Airway & Oxygen Therapy: Patient Spontanous Breathing and Patient connected to nasal cannula oxygen  Post-op Assessment: Report given to RN and Post -op Vital signs reviewed and stable  Post vital signs: Reviewed and stable  Last Vitals:  Vitals Value Taken Time  BP 110/70 12/14/21 1015  Temp 36.4 C 12/14/21 1015  Pulse 75 12/14/21 1019  Resp 12 12/14/21 1019  SpO2 93 % 12/14/21 1019  Vitals shown include unvalidated device data.  Last Pain:  Vitals:   12/14/21 0831  TempSrc: Temporal  PainSc: 0-No pain         Complications: No notable events documented.

## 2021-12-14 NOTE — Progress Notes (Signed)
  Echocardiogram Echocardiogram Transesophageal has been performed.  Janalyn Harder 12/14/2021, 10:16 AM

## 2021-12-14 NOTE — Anesthesia Preprocedure Evaluation (Addendum)
Anesthesia Evaluation  Patient identified by MRN, date of birth, ID band Patient awake    Reviewed: Allergy & Precautions, H&P , NPO status , Patient's Chart, lab work & pertinent test results  Airway Mallampati: II  TM Distance: >3 FB Neck ROM: Full    Dental no notable dental hx. (+) Edentulous Upper, Poor Dentition, Missing, Dental Advisory Given, Partial Lower,    Pulmonary former smoker   Pulmonary exam normal breath sounds clear to auscultation       Cardiovascular Exercise Tolerance: Good hypertension, + CAD, + Past MI and +CHF  Normal cardiovascular exam Rhythm:Regular Rate:Normal     Neuro/Psych negative neurological ROS  negative psych ROS   GI/Hepatic negative GI ROS, Neg liver ROS,,,  Endo/Other  diabetesHypothyroidism    Renal/GU negative Renal ROS  negative genitourinary   Musculoskeletal  (+) Arthritis ,  Fibromyalgia -  Abdominal   Peds negative pediatric ROS (+)  Hematology negative hematology ROS (+)   Anesthesia Other Findings   Reproductive/Obstetrics negative OB ROS                             Anesthesia Physical Anesthesia Plan  ASA: 4  Anesthesia Plan: General   Post-op Pain Management: Minimal or no pain anticipated   Induction: Intravenous  PONV Risk Score and Plan: 3 and Propofol infusion  Airway Management Planned: Mask and Natural Airway  Additional Equipment: None  Intra-op Plan:   Post-operative Plan:   Informed Consent: I have reviewed the patients History and Physical, chart, labs and discussed the procedure including the risks, benefits and alternatives for the proposed anesthesia with the patient or authorized representative who has indicated his/her understanding and acceptance.       Plan Discussed with: Anesthesiologist and CRNA  Anesthesia Plan Comments: (ECHO 6/23 1. Left ventricular ejection fraction, by estimation, is 30 to  35%. The  left ventricle has moderately decreased function. The left ventricle  demonstrates global hypokinesis. There is mild concentric left ventricular  hypertrophy. Left ventricular  diastolic parameters are consistent with Grade II diastolic dysfunction  (pseudonormalization).   2. Right ventricular systolic function is mildly reduced. The right  ventricular size is normal.   3. Left atrial size was mildly dilated.   4. The mitral valve is normal in structure. Mild mitral valve  regurgitation. No evidence of mitral stenosis.   5. The aortic valve is normal in structure. Aortic valve regurgitation is  not visualized. No aortic stenosis is present.   6. The inferior vena cava is normal in size with greater than 50%  respiratory variability, suggesting right atrial pressure of 3 mmHg.   Cath 6/23  1: Right atrial pressure-14/9, mean 9 2: Right ventricular pressure-50/7 3: Pulmonary artery pressure-48/27, mean 36 4: Pulmonary wedge pressure-A-wave 35, V wave 43, mean 36 5: LVEDP-33 6: Cardiac output-5.39 L/min with an index of 2.46 L/min/m    IMPRESSION: Ms. Onofrio has clean coronary arteries with severe LV dysfunction and elevated filling pressures.   )       Anesthesia Quick Evaluation

## 2021-12-14 NOTE — Interval H&P Note (Signed)
History and Physical Interval Note:  12/14/2021 9:02 AM  Stacey Fletcher  has presented today for surgery, with the diagnosis of MITRAL VALVE REGURGETATION.  The various methods of treatment have been discussed with the patient and family. After consideration of risks, benefits and other options for treatment, the patient has consented to  Procedure(s): TRANSESOPHAGEAL ECHOCARDIOGRAM (TEE) (N/A) as a surgical intervention.  The patient's history has been reviewed, patient examined, no change in status, stable for surgery.  I have reviewed the patient's chart and labs.  Questions were answered to the patient's satisfaction.     Aldahir Litaker A Kanyah Matsushima

## 2021-12-17 ENCOUNTER — Encounter (HOSPITAL_COMMUNITY): Payer: Self-pay | Admitting: Internal Medicine

## 2021-12-18 ENCOUNTER — Telehealth: Payer: Self-pay

## 2021-12-18 NOTE — Telephone Encounter (Signed)
Per [employer] review of 12/14/2021 TEE, "For patient MH: This is secondary MR (atrial functional)   This looks like a suitable valve for a MitraClip. The fossa looks approachable for transseptal puncture in the SAXB and Bicaval views. LA dimensions are large enough for device steering and straddle. The MR is caused by a dilated RA/MV annulus.  Posterior leaflet measures between 1.73cm in the LVOT views.  MVA measures 6.04cm2 and gradient is not measured. I'd like to verify both in the procedure prior to the start.  Based on this information,  I would start with a XTW placed at A2/P2 OR zip & clip with NTW depending on pre-procedural TEE,  reassess for gradient & residual.   *I believe the images showing projected septal puncture to valve are foreshortened but need to reassess pre procedurally."

## 2021-12-21 ENCOUNTER — Telehealth (HOSPITAL_COMMUNITY): Payer: Self-pay | Admitting: Vascular Surgery

## 2021-12-21 NOTE — Telephone Encounter (Signed)
Lvm to make new pt appt w. AS next week

## 2021-12-25 NOTE — Telephone Encounter (Signed)
Left 2nd VM to make new appt w/ AS

## 2021-12-26 ENCOUNTER — Telehealth (HOSPITAL_COMMUNITY): Payer: Self-pay | Admitting: Emergency Medicine

## 2021-12-26 NOTE — Telephone Encounter (Signed)
Attempted to call patient regarding upcoming cardiac MR appointment. Left message on voicemail with name and callback number Lynnmarie Lovett RN Navigator Cardiac Imaging  Heart and Vascular Services [phone] Office [phone] Cell  

## 2021-12-27 ENCOUNTER — Ambulatory Visit
Admission: RE | Admit: 2021-12-27 | Discharge: 2021-12-27 | Disposition: A | Discharge status: Home / Self Care | Payer: Medicare Other | Source: Ambulatory Visit | Attending: Internal Medicine | Admitting: Internal Medicine

## 2021-12-27 ENCOUNTER — Other Ambulatory Visit: Payer: Self-pay | Admitting: Internal Medicine

## 2021-12-27 DIAGNOSIS — I340 Nonrheumatic mitral (valve) insufficiency: Secondary | ICD-10-CM | POA: Diagnosis present

## 2021-12-27 MED ORDER — GADOBUTROL 1 MMOL/ML IV SOLN
13.0000 mL | Freq: Once | INTRAVENOUS | Status: AC | PRN
  Administered 2021-12-27: 13 mL via INTRAVENOUS

## 2022-01-03 ENCOUNTER — Encounter: Payer: Self-pay | Admitting: Internal Medicine

## 2022-01-04 NOTE — Progress Notes (Signed)
Several attempted were made to contact the patient to review results and to schedule evaluation with HF with no call back.  See 11/29 MyChart response to patient.  Instructed her to contact [facility] to arrange appointment. She responded that she would call today to arrange.

## 2022-01-09 ENCOUNTER — Ambulatory Visit (HOSPITAL_COMMUNITY)
Admission: RE | Admit: 2022-01-09 | Discharge: 2022-01-09 | Disposition: A | Discharge status: Home / Self Care | Payer: Medicare Other | Source: Ambulatory Visit | Attending: Cardiology | Admitting: Cardiology

## 2022-01-09 ENCOUNTER — Encounter (HOSPITAL_COMMUNITY): Payer: Self-pay | Admitting: Cardiology

## 2022-01-09 VITALS — BP 102/60 | HR 82 | Wt 220.8 lb

## 2022-01-09 DIAGNOSIS — I340 Nonrheumatic mitral (valve) insufficiency: Secondary | ICD-10-CM | POA: Diagnosis not present

## 2022-01-09 DIAGNOSIS — I5020 Unspecified systolic (congestive) heart failure: Secondary | ICD-10-CM | POA: Diagnosis present

## 2022-01-09 DIAGNOSIS — N179 Acute kidney failure, unspecified: Secondary | ICD-10-CM | POA: Diagnosis not present

## 2022-01-09 LAB — BASIC METABOLIC PANEL
Anion gap: 12 (ref 5–15)
BUN: 15 mg/dL (ref 8–23)
CO2: 24 mmol/L (ref 22–32)
Calcium: 9.5 mg/dL (ref 8.9–10.3)
Chloride: 103 mmol/L (ref 98–111)
Creatinine, Ser: 1.07 mg/dL — ABNORMAL HIGH (ref 0.44–1.00)
GFR, Estimated: 55 mL/min — ABNORMAL LOW (ref >60)
Glucose, Bld: 128 mg/dL — ABNORMAL HIGH (ref 70–99)
Potassium: 3.8 mmol/L (ref 3.5–5.1)
Sodium: 139 mmol/L (ref 135–145)

## 2022-01-09 LAB — BRAIN NATRIURETIC PEPTIDE: B Natriuretic Peptide: 281 pg/mL — ABNORMAL HIGH (ref 0.0–100.0)

## 2022-01-09 MED ORDER — METOPROLOL SUCCINATE ER 100 MG PO TB24: 100.0000 mg | ORAL_TABLET | Freq: Every day | ORAL | 3 refills | Status: DC

## 2022-01-09 NOTE — Patient Instructions (Signed)
INCREASE Toprol Xl to 100 mg daily.  Labs done today, your results will be available in MyChart, we will contact you for abnormal readings.  Your physician has requested that you have an echocardiogram. Echocardiography is a painless test that uses sound waves to create images of your heart. It provides your doctor with information about the size and shape of your heart and how well your heart's chambers and valves are working. This procedure takes approximately one hour. There are no restrictions for this procedure. Please do NOT wear cologne, perfume, aftershave, or lotions (deodorant is allowed). Please arrive 15 minutes prior to your appointment time.  Your provider has ordered Renal doppler. You will be called to have the test arranged.   You have been referred to cardiac rehab. They will call you to arrange your appointment.  You have been referred to the Orthopedic surgeons. This office will call you to arrange your appointment.  Your physician recommends that you schedule a follow-up appointment in: 6 weeks   If you have any questions or concerns before your next appointment please send [place] a message through [place] or call our office at [phone].    TO LEAVE A MESSAGE FOR THE NURSE SELECT OPTION 2, PLEASE LEAVE A MESSAGE INCLUDING: YOUR NAME DATE OF BIRTH CALL BACK NUMBER REASON FOR CALL**this is important as we prioritize the call backs  YOU WILL RECEIVE A CALL BACK THE SAME DAY AS LONG AS YOU CALL BEFORE 4:00 PM  At the [facility], you and your health needs are our priority. As part of our continuing mission to provide you with exceptional heart care, we have created designated Provider Care Teams. These Care Teams include your primary Cardiologist (physician) and Advanced Practice Providers (APPs- Physician Assistants and Nurse Practitioners) who all work together to provide you with the care you need, when you need it.   You may see any of the following  providers on your designated Care Team at your next follow up: Dr Arvilla Meres Dr Marca Ancona Dr. Marcos Eke, NP Robbie Lis, [place] [facility], [place] Brynda Peon, NP Karle Plumber, PharmD   Please be sure to bring in all your medications bottles to every appointment.

## 2022-01-09 NOTE — Progress Notes (Signed)
ReDS Vest / Clip - 01/09/22 1500       ReDS Vest / Clip   Station Marker D    Ruler Value 33    ReDS Value Range Low volume    ReDS Actual Value 27

## 2022-01-11 NOTE — Progress Notes (Incomplete)
ADVANCED HE[facility] FAILURE CLINIC NOTE  Referring Physician: Quitman Livings, MD  Primary Care: Quitman Livings, MD Primary Cardiologist:  HPI: Stacey Fletcher is a 71 y.o. female with HFrEF w/ mod-severe MR 2/2 nonischemic cardiomyopathy, T2DM, longstanding HTN,    Activity level/exercise tolerance:  *** Orthopnea:  Sleeps on *** pillows Paroxysmal noctural dyspnea:  *** Chest pain/pressure:  *** Orthostatic lightheadedness:  *** Palpitations:  *** Lower extremity edema:  *** Presyncope/syncope:  *** Cough:  ***  Past Medical History:  Diagnosis Date   Coronary artery disease    Depression    Diabetes mellitus    Fibromyalgia    Goiter    Hypertension    MI (myocardial infarction) (HCC)    Osteoarthritis    Pneumonia    10+ years ago.    Current Outpatient Medications  Medication Sig Dispense Refill   aspirin 81 MG chewable tablet Chew 81 mg by mouth in the morning.     buPROPion (WELLBUTRIN XL) 300 MG 24 hr tablet Take 300 mg by mouth every morning.     cetirizine (ZYRTEC) 10 MG tablet Take 10 mg by mouth daily as needed for allergies.     dapagliflozin propanediol (FARXIGA) 10 MG TABS tablet Take 1 tablet (10 mg total) by mouth daily. 90 tablet 3   diphenhydrAMINE (BENADRYL) 25 MG tablet Take 25 mg by mouth at bedtime.     fluticasone (FLONASE) 50 MCG/ACT nasal spray Place 1 spray into both nostrils daily as needed for allergies or rhinitis.     furosemide (LASIX) 40 MG tablet Take 1 tablet (40 mg total) by mouth every other day. 45 tablet 3   gabapentin (NEURONTIN) 100 MG capsule TAKE 1 CAPSULE(100 MG) BY MOUTH AT BEDTIME 30 capsule 6   HYDROcodone-acetaminophen (NORCO/VICODIN) 5-325 MG tablet Take 1 tablet by mouth every 8 (eight) hours as needed for moderate pain.     levothyroxine (SYNTHROID) 25 MCG tablet Take 25 mcg by mouth daily before breakfast.     losartan (COZAAR) 25 MG tablet Take 1 tablet (25 mg total) by mouth daily. 90 tablet 3   Menthol, Topical  Analgesic, (BENGAY EX) Apply 1 Application topically daily.     metFORMIN (GLUCOPHAGE) 1000 MG tablet Take 1,000 mg by mouth daily.     Potassium Chloride ER 20 MEQ TBCR TAKE 1 TABLET BY MOUTH EVERY OTHER DAY 45 tablet 3   pravastatin (PRAVACHOL) 20 MG tablet Take 20 mg by mouth in the morning.     spironolactone (ALDACTONE) 25 MG tablet Take 1 tablet (25 mg total) by mouth daily. 90 tablet 3   Trolamine Salicylate (BLUE-EMU MAXIMUM PAIN RELIEF EX) Apply 1 Application topically daily.     venlafaxine XR (EFFEXOR-XR) 75 MG 24 hr capsule Take 75 mg by mouth in the morning, at noon, and at bedtime.     metoprolol succinate (TOPROL-XL) 100 MG 24 hr tablet Take 1 tablet (100 mg total) by mouth daily. Take with or immediately following a meal. 90 tablet 3   No current facility-administered medications for this encounter.    Allergies  Allergen Reactions   Entresto [Sacubitril-Valsartan] Hives   Prednisone Shortness Of Breath and Rash   Ace Inhibitors Other (See Comments)    Dizzy headaches, crawling feeling inside of arm   Empagliflozin Other (See Comments)    uti/yeast infection   Sitagliptin Itching   Procardia [Nifedipine] Palpitations    "heart attack symptoms"      Social History   Socioeconomic History   Marital  status: Divorced    Spouse name: Not on file   Number of children: 3   Years of education: Not on file   Highest education level: Bachelor's degree (e.g., BA, AB, BS)  Occupational History   Occupation: retired  Tobacco Use   Smoking status: Former    Types: Cigarettes    Quit date: 07/07/2018    Years since quitting: 3.5   Smokeless tobacco: Never  Vaping Use   Vaping Use: Never used  Substance and Sexual Activity   Alcohol use: No   Drug use: No   Sexual activity: Not Currently  Other Topics Concern   Not on file  Social History Narrative   Not on file   Social Determinants of Health   Financial Resource Strain: Low Risk  (07/21/2021)   Overall Financial  Resource Strain (CARDIA)    Difficulty of Paying Living Expenses: Not very hard  Food Insecurity: No Food Insecurity (07/21/2021)   Hunger Vital Sign    Worried About Running Out of Food in the Last Year: Never true    Ran Out of Food in the Last Year: Never true  Transportation Needs: No Transportation Needs (07/21/2021)   PRAPARE - Administrator, Civil Service (Medical): No    Lack of Transportation (Non-Medical): No  Physical Activity: Not on file  Stress: Not on file  Social Connections: Not on file  Intimate Partner Violence: Not on file      Family History  Problem Relation Age of Onset   Hypertension Mother    Diabetes Mother    Stroke Mother    Hypertension Other    Diabetes Other     PHYSICAL EXAM: Vitals:   01/09/22 1448  BP: 102/60  Pulse: 82  SpO2: 97%   GENERAL: Well nourished, well developed, and in no apparent distress at rest.  HEENT: Negative for arcus senilis or xanthelasma. There is no scleral icterus.  The mucous membranes are pink and moist.   NECK: Supple, No masses. Normal carotid upstrokes without bruits. No masses or thyromegaly.    CHEST: There are no chest wall deformities. There is no chest wall tenderness. Respirations are unlabored.  Lungs- *** CARDIAC:  JVP: *** cm H2O         {HEART SOUNDS:22645}  Normal rate with regular rhythm. No murmurs, rubs or gallops.  Pulses are 2+ and symmetrical in upper and lower extremities. *** edema.  ABDOMEN: Soft, non-tender, non-distended. There are no masses or hepatomegaly. There are normal bowel sounds.  EXTREMITIES: Warm and well perfused with no cyanosis, clubbing.  LYMPHATIC: No axillary or supraclavicular lymphadenopathy.  NEUROLOGIC: Patient is oriented x3 with no focal or lateralizing neurologic deficits.  PSYCH: Patients affect is appropriate, there is no evidence of anxiety or depression.  SKIN: Warm and dry; no lesions or wounds.   DATA  REVIEW  ECG:***  ECHO:***  CATH:***   ASSESSMENT & PLAN:  *** Etiology of HF:*** NYHA class / AHA Stage:*** Volume status & Diuretics: *** Vasodilators:*** Beta-Blocker:*** MRA:*** Cardiometabolic:*** Devices therapies & Valvulopathies:*** Advanced therapies:***  Follow-up:  No follow-ups on file.   [facility]im [facility] Mechanical Circulatory Support

## 2022-01-15 ENCOUNTER — Ambulatory Visit (HOSPITAL_COMMUNITY)
Admission: RE | Admit: 2022-01-15 | Discharge: 2022-01-15 | Disposition: A | Discharge status: Home / Self Care | Payer: Medicare Other | Source: Ambulatory Visit | Attending: Cardiology | Admitting: Cardiology

## 2022-01-15 DIAGNOSIS — N179 Acute kidney failure, unspecified: Secondary | ICD-10-CM | POA: Diagnosis not present

## 2022-01-25 ENCOUNTER — Ambulatory Visit: Payer: Medicare Other | Admitting: Orthopedic Surgery

## 2022-01-25 ENCOUNTER — Ambulatory Visit: Payer: Self-pay

## 2022-01-25 ENCOUNTER — Ambulatory Visit (INDEPENDENT_AMBULATORY_CARE_PROVIDER_SITE_OTHER): Payer: Medicare Other

## 2022-01-25 ENCOUNTER — Ambulatory Visit (INDEPENDENT_AMBULATORY_CARE_PROVIDER_SITE_OTHER): Payer: Medicare Other | Admitting: Orthopedic Surgery

## 2022-01-25 DIAGNOSIS — M1712 Unilateral primary osteoarthritis, left knee: Secondary | ICD-10-CM

## 2022-01-25 DIAGNOSIS — M1711 Unilateral primary osteoarthritis, right knee: Secondary | ICD-10-CM | POA: Diagnosis not present

## 2022-01-26 ENCOUNTER — Encounter: Payer: Self-pay | Admitting: Orthopedic Surgery

## 2022-01-26 NOTE — Progress Notes (Signed)
Office Visit Note   Patient: Stacey Fletcher           Date of Birth: 06/29/1950           MRN: [id number] Visit Date: 01/25/2022              Requested by: Quitman Livings, MD [address] [place],  [place] [address] PCP: Quitman Livings, MD  Chief Complaint  Patient presents with   Right Knee - Pain   Left Knee - Pain      HPI: Patient is a 71 year old woman with chronic osteoarthritis both knees.  Patient has had recent medical problems including heart failure and patient states she may need to have mitral valve surgery.  Assessment & Plan: Visit Diagnoses:  1. Unilateral primary osteoarthritis, right knee   2. Unilateral primary osteoarthritis, left knee     Plan: Will set up for presurgical physical therapy to see if patient has the strength and endurance to proceed with surgery.  Will contact cardiology to obtain cardiac clearance.  Follow-Up Instructions: Return in about 4 weeks (around 02/22/2022).   Ortho Exam  Patient is alert, oriented, no adenopathy, well-dressed, normal affect, normal respiratory effort. Examination patient has difficulty getting up from a sitting to a standing position.  She has crepitation with range of motion collaterals and cruciates are stable bilaterally.  She lacks about 10 degrees to full extension bilaterally.  Imaging: XR Knee 1-2 Views Left  Result Date: 01/26/2022 2 view radiographs of the left knee shows tricompartmental arthritic changes with bone-on-bone contact medial joint line periarticular bony spurs in all 3 compartments with subchondral cyst and sclerosis.  XR Knee 1-2 Views Right  Result Date: 01/26/2022 2 view radiographs of the right knee shows tricompartmental arthritic changes with bone-on-bone contact medial joint line periarticular bony spurs in all 3 compartments with subchondral cyst and sclerosis.  No images are attached to the encounter.  Labs: Lab Results  Component Value Date   HGBA1C 6.7 (H)  07/20/2021   HGBA1C 6.8 (H) 05/02/2020   ESRSEDRATE 13 08/03/2009   REPTSTATUS 05/11/2013 FINAL 05/07/2013   CULT  05/07/2013    NO SALMONELLA, SHIGELLA, CAMPYLOBACTER, YERSINIA, OR E.COLI 0157:H7 ISOLATED Performed at [employer] [facility] ESCHERICHIA COLI 05/06/2013     Lab Results  Component Value Date   ALBUMIN 3.8 08/10/2021   ALBUMIN 3.7 05/02/2020   ALBUMIN 4.3 08/05/2016    Lab Results  Component Value Date   MG 1.9 07/20/2021   MG 1.7 05/06/2013   MG 1.9 10/20/2011   Lab Results  Component Value Date   VD25OH 30 03/01/2009    No results found for: "PREALBUMIN"    Latest Ref Rng & Units 07/23/2021    2:31 AM 07/22/2021   12:57 AM 07/21/2021    2:21 PM  CBC EXTENDED  WBC 4.0 - 10.5 K/uL 8.6  7.7    RBC 3.87 - 5.11 MIL/uL 4.93  4.51    Hemoglobin 12.0 - 15.0 g/dL 16.0  10.9  32.3   HCT 36.0 - 46.0 % 39.7  36.3  36.0   Platelets 150 - 400 K/uL 241  223       There is no height or weight on file to calculate BMI.  Orders:  Orders Placed This Encounter  Procedures   XR Knee 1-2 Views Right   XR Knee 1-2 Views Left   Ambulatory referral to Physical Therapy   No orders of the defined types were placed  in this encounter.    Procedures: No procedures performed  Clinical Data: No additional findings.  ROS:  All other systems negative, except as noted in the HPI. Review of Systems  Objective: Vital Signs: There were no vitals taken for this visit.  Specialty Comments:  No specialty comments available.  PMFS History: Patient Active Problem List   Diagnosis Date Noted   Severe mitral regurgitation 12/14/2021   Acute respiratory failure with hypoxia (HCC) 07/20/2021   Elevated troponin 07/20/2021   Acute CHF (congestive heart failure) (HCC) 07/20/2021   Pneumonia    Osteoarthritis    MI (myocardial infarction) (HCC)    Hypertension    Goiter    Fibromyalgia    Depression    Coronary artery disease    Arthritis of carpometacarpal  (CMC) joint of right thumb 07/15/2018   Pain of right hand 07/15/2018   ARF (acute renal failure) (HCC) 08/20/2014   Chest pain 05/06/2013   Nausea vomiting and diarrhea 05/06/2013   Fever 05/06/2013   HTN (hypertension) 05/06/2013   Hypothyroidism 05/06/2013   Diabetes mellitus (HCC) 05/06/2013   Past Medical History:  Diagnosis Date   Coronary artery disease    Depression    Diabetes mellitus    Fibromyalgia    Goiter    Hypertension    MI (myocardial infarction) (HCC)    Osteoarthritis    Pneumonia    10+ years ago.    Family History  Problem Relation Age of Onset   Hypertension Mother    Diabetes Mother    Stroke Mother    Hypertension Other    Diabetes Other     Past Surgical History:  Procedure Laterality Date   ABDOMINAL HYSTERECTOMY     BREAST SURGERY     BUBBLE STUDY  12/14/2021   Procedure: BUBBLE STUDY;  Surgeon: Christell Constant, MD;  Location: [facility] ENDOSCOPY;  Service: Cardiovascular;;   CARDIAC CATHETERIZATION     1995   RIGHT/LEFT HEART CATH AND CORONARY ANGIOGRAPHY N/A 07/21/2021   Procedure: RIGHT/LEFT HEART CATH AND CORONARY ANGIOGRAPHY;  Surgeon: Runell Gess, MD;  Location: [facility] INVASIVE CV LAB;  Service: Cardiovascular;  Laterality: N/A;   TEE WITHOUT CARDIOVERSION N/A 12/14/2021   Procedure: TRANSESOPHAGEAL ECHOCARDIOGRAM (TEE);  Surgeon: Christell Constant, MD;  Location: [facility] ENDOSCOPY;  Service: Cardiovascular;  Laterality: N/A;   Social History   Occupational History   Occupation: retired  Tobacco Use   Smoking status: Former    Types: Cigarettes    Quit date: 07/07/2018    Years since quitting: 3.5   Smokeless tobacco: Never  Vaping Use   Vaping Use: Never used  Substance and Sexual Activity   Alcohol use: No   Drug use: No   Sexual activity: Not Currently

## 2022-02-07 ENCOUNTER — Ambulatory Visit: Payer: Medicare Other
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
## 2022-02-13 ENCOUNTER — Ambulatory Visit: Payer: 59 | Attending: Internal Medicine

## 2022-02-13 ENCOUNTER — Other Ambulatory Visit: Payer: Self-pay

## 2022-02-13 DIAGNOSIS — M6281 Muscle weakness (generalized): Secondary | ICD-10-CM

## 2022-02-13 DIAGNOSIS — G8929 Other chronic pain: Secondary | ICD-10-CM | POA: Diagnosis present

## 2022-02-13 DIAGNOSIS — M25562 Pain in left knee: Secondary | ICD-10-CM | POA: Insufficient documentation

## 2022-02-13 DIAGNOSIS — M25561 Pain in right knee: Secondary | ICD-10-CM | POA: Diagnosis present

## 2022-02-13 DIAGNOSIS — R2689 Other abnormalities of gait and mobility: Secondary | ICD-10-CM | POA: Insufficient documentation

## 2022-02-20 ENCOUNTER — Ambulatory Visit (HOSPITAL_BASED_OUTPATIENT_CLINIC_OR_DEPARTMENT_OTHER)
Admission: RE | Admit: 2022-02-20 | Discharge: 2022-02-20 | Disposition: A | Discharge status: Home / Self Care | Payer: 59 | Source: Ambulatory Visit | Attending: Cardiology | Admitting: Cardiology

## 2022-02-20 ENCOUNTER — Encounter (HOSPITAL_COMMUNITY): Payer: Self-pay | Admitting: Cardiology

## 2022-02-20 ENCOUNTER — Ambulatory Visit (HOSPITAL_COMMUNITY)
Admission: RE | Admit: 2022-02-20 | Discharge: 2022-02-20 | Disposition: A | Discharge status: Home / Self Care | Payer: 59 | Source: Ambulatory Visit | Attending: Cardiology | Admitting: Cardiology

## 2022-02-20 VITALS — BP 110/70 | HR 72 | Wt 215.6 lb

## 2022-02-20 DIAGNOSIS — I5022 Chronic systolic (congestive) heart failure: Secondary | ICD-10-CM | POA: Diagnosis not present

## 2022-02-20 DIAGNOSIS — R0609 Other forms of dyspnea: Secondary | ICD-10-CM | POA: Diagnosis not present

## 2022-02-20 DIAGNOSIS — I340 Nonrheumatic mitral (valve) insufficiency: Secondary | ICD-10-CM | POA: Diagnosis not present

## 2022-02-20 DIAGNOSIS — E119 Type 2 diabetes mellitus without complications: Secondary | ICD-10-CM | POA: Insufficient documentation

## 2022-02-20 DIAGNOSIS — M1711 Unilateral primary osteoarthritis, right knee: Secondary | ICD-10-CM | POA: Insufficient documentation

## 2022-02-20 DIAGNOSIS — I10 Essential (primary) hypertension: Secondary | ICD-10-CM | POA: Diagnosis not present

## 2022-02-20 DIAGNOSIS — F4310 Post-traumatic stress disorder, unspecified: Secondary | ICD-10-CM | POA: Insufficient documentation

## 2022-02-20 DIAGNOSIS — I5020 Unspecified systolic (congestive) heart failure: Secondary | ICD-10-CM

## 2022-02-20 DIAGNOSIS — Z7984 Long term (current) use of oral hypoglycemic drugs: Secondary | ICD-10-CM | POA: Diagnosis not present

## 2022-02-20 DIAGNOSIS — Z79899 Other long term (current) drug therapy: Secondary | ICD-10-CM | POA: Diagnosis not present

## 2022-02-20 DIAGNOSIS — I429 Cardiomyopathy, unspecified: Secondary | ICD-10-CM | POA: Insufficient documentation

## 2022-02-20 DIAGNOSIS — I110 Hypertensive heart disease with heart failure: Secondary | ICD-10-CM | POA: Diagnosis not present

## 2022-02-20 DIAGNOSIS — R079 Chest pain, unspecified: Secondary | ICD-10-CM | POA: Insufficient documentation

## 2022-02-20 LAB — BASIC METABOLIC PANEL
Anion gap: 10 (ref 5–15)
BUN: 20 mg/dL (ref 8–23)
CO2: 26 mmol/L (ref 22–32)
Calcium: 9.3 mg/dL (ref 8.9–10.3)
Chloride: 100 mmol/L (ref 98–111)
Creatinine, Ser: 0.98 mg/dL (ref 0.44–1.00)
GFR, Estimated: >60 mL/min (ref >60)
Glucose, Bld: 93 mg/dL (ref 70–99)
Potassium: 4.1 mmol/L (ref 3.5–5.1)
Sodium: 136 mmol/L (ref 135–145)

## 2022-02-20 LAB — ECHOCARDIOGRAM COMPLETE
AV Mean grad: 3 mmHg
AV Peak grad: 5.9 mmHg
Ao pk vel: 1.21 m/s
Area-P 1/2: 4.8 cm2
Single Plane A4C EF: 18.3 %

## 2022-02-20 LAB — BRAIN NATRIURETIC PEPTIDE: B Natriuretic Peptide: 147.5 pg/mL — ABNORMAL HIGH (ref 0.0–100.0)

## 2022-02-20 MED ORDER — DIGOXIN 125 MCG PO TABS: 0.1250 mg | ORAL_TABLET | Freq: Every day | ORAL | 3 refills | Status: DC

## 2022-02-20 MED ORDER — LOSARTAN POTASSIUM 50 MG PO TABS: 50.0000 mg | ORAL_TABLET | Freq: Every day | ORAL | 3 refills | Status: DC

## 2022-02-20 NOTE — Progress Notes (Signed)
Echocardiogram 2D Echocardiogram has been performed.  Stacey Fletcher 02/20/2022, 3:03 PM

## 2022-02-20 NOTE — Patient Instructions (Signed)
INCREASE Losartan 50mg  daily.  START Digoxin 0.11mcg daily.  Labs done today, your results will be available in MyChart, we will contact you for abnormal readings.  Your physician has recommended that you have a cardiopulmonary stress test (CPX). CPX testing is a non-invasive measurement of heart and lung function. It replaces a traditional treadmill stress test. This type of test provides a tremendous amount of information that relates not only to your present condition but also for future outcomes. This test combines measurements of you ventilation, respiratory gas exchange in the lungs, electrocardiogram (EKG), blood pressure and physical response before, during, and following an exercise protocol.  Your physician recommends that you schedule a follow-up appointment in: 1 month  If you have any questions or concerns before your next appointment please send [place] a message through [facility] [place] [facility] or call our office at [phone].    TO LEAVE A MESSAGE FOR THE NURSE SELECT OPTION 2, PLEASE LEAVE A MESSAGE INCLUDING: YOUR NAME DATE OF BIRTH CALL BACK NUMBER REASON FOR CALL**this is important as we prioritize the call backs  YOU WILL RECEIVE A CALL BACK THE SAME DAY AS LONG AS YOU CALL BEFORE 4:00 PM  At the [facility], you and your health needs are our priority. As part of our continuing mission to provide you with exceptional heart care, we have created designated Provider Care Teams. These Care Teams include your primary Cardiologist (physician) and Advanced Practice Providers (APPs- Physician Assistants and Nurse Practitioners) who all work together to provide you with the care you need, when you need it.   You may see any of the following providers on your designated Care Team at your next follow up: Dr Glori Bickers Dr Loralie Champagne Dr. Roxana Hires, NP Lyda Jester, [place] [facility] [place], [place] Forestine Na, NP Audry Riles,  PharmD   Please be sure to bring in all your medications bottles to every appointment.

## 2022-02-20 NOTE — Progress Notes (Signed)
ADVANCED HEART FAILURE CLINIC NOTE  Referring Physician: Antonietta Jewel, MD  Primary Care: Antonietta Jewel, MD Primary HF: Dr. Daniel Nones  HPI: Stacey Fletcher is a 72 y.o. female with  HTN, T2DM, PTSD from son's death and diagnosis of HFrEF made last summer. In June of 2023, she reports waking up in her sleep with severe respiratory distress after a few weeks of worsening DOE. She was admitted to [facility] where she was found to have an LVEF of 30-35%; she was diuresed with IV lasix and discharged on low dose GDMT. She had a subsequent TTE in September 2023 w/ worsening LV function (LVEF 20-25%) and moderate to severe MR. Since that time she has also undergone TEE confirming presence of moderate to severe MR for which she has seen structural cardiology. Otherwise, Stacey Fletcher attempts to be as active as possible. She feels very limited by right knee osteoarthritis for which she is seeing orthopedic surgery. She is originally from [place] but moved to [place] after the murder of her son. She has excellent support here from her 2nd son.   Since our last appointment, Stacey Fletcher has been doing fairly well. She's had no further episodes of dyspnea or LE edema. She feels that she has more energy throughout the day.   Activity level/exercise tolerance:  NYHA IIB - III  Orthopnea:  Sleeps on 2 pillows Paroxysmal noctural dyspnea:  infrequent Chest pain/pressure:  yes Orthostatic lightheadedness:  no Palpitations:  no Lower extremity edema:  no Presyncope/syncope:  no Cough:  no  Past Medical History:  Diagnosis Date   Coronary artery disease    Depression    Diabetes mellitus    Fibromyalgia    Goiter    Hypertension    MI (myocardial infarction) ([place])    Osteoarthritis    Pneumonia    10+ years ago.    Current Outpatient Medications  Medication Sig Dispense Refill   aspirin 81 MG chewable tablet Chew 81 mg by mouth in the morning.     buPROPion (WELLBUTRIN XL) 300 MG 24 hr tablet Take  300 mg by mouth every morning.     cetirizine (ZYRTEC) 10 MG tablet Take 10 mg by mouth daily as needed for allergies.     dapagliflozin propanediol (FARXIGA) 10 MG TABS tablet Take 1 tablet (10 mg total) by mouth daily. 90 tablet 3   diphenhydrAMINE (BENADRYL) 25 MG tablet Take 25 mg by mouth at bedtime.     fluticasone (FLONASE) 50 MCG/ACT nasal spray Place 1 spray into both nostrils daily as needed for allergies or rhinitis.     furosemide (LASIX) 40 MG tablet Take 1 tablet (40 mg total) by mouth every other day. 45 tablet 3   gabapentin (NEURONTIN) 100 MG capsule TAKE 1 CAPSULE(100 MG) BY MOUTH AT BEDTIME 30 capsule 6   HYDROcodone-acetaminophen (NORCO/VICODIN) 5-325 MG tablet Take 1 tablet by mouth every 8 (eight) hours as needed for moderate pain.     levothyroxine (SYNTHROID) 25 MCG tablet Take 25 mcg by mouth daily before breakfast.     losartan (COZAAR) 25 MG tablet Take 1 tablet (25 mg total) by mouth daily. 90 tablet 3   Menthol, Topical Analgesic, (BENGAY EX) Apply 1 Application topically daily.     metFORMIN (GLUCOPHAGE) 1000 MG tablet Take 1,000 mg by mouth daily.     metoprolol succinate (TOPROL-XL) 100 MG 24 hr tablet Take 1 tablet (100 mg total) by mouth daily. Take with or immediately following a meal. 90 tablet 3  Potassium Chloride ER 20 MEQ TBCR TAKE 1 TABLET BY MOUTH EVERY OTHER DAY 45 tablet 3   pravastatin (PRAVACHOL) 20 MG tablet Take 20 mg by mouth in the morning.     spironolactone (ALDACTONE) 25 MG tablet Take 1 tablet (25 mg total) by mouth daily. 90 tablet 3   Trolamine Salicylate (BLUE-EMU MAXIMUM PAIN RELIEF EX) Apply 1 Application topically daily.     venlafaxine XR (EFFEXOR-XR) 75 MG 24 hr capsule Take 75 mg by mouth in the morning, at noon, and at bedtime.     No current facility-administered medications for this encounter.    Allergies  Allergen Reactions   Entresto [Sacubitril-Valsartan] Hives   Prednisone Shortness Of Breath and Rash   Ace Inhibitors  Other (See Comments)    Dizzy headaches, crawling feeling inside of arm   Empagliflozin Other (See Comments)    uti/yeast infection   Sitagliptin Itching   Procardia [Nifedipine] Palpitations    "heart attack symptoms"      Social History   Socioeconomic History   Marital status: Divorced    Spouse name: Not on file   Number of children: 3   Years of education: Not on file   Highest education level: Bachelor's degree (e.g., BA, AB, BS)  Occupational History   Occupation: retired  Tobacco Use   Smoking status: Former    Types: Cigarettes    Quit date: 07/07/2018    Years since quitting: 3.6   Smokeless tobacco: Never  Vaping Use   Vaping Use: Never used  Substance and Sexual Activity   Alcohol use: No   Drug use: No   Sexual activity: Not Currently  Other Topics Concern   Not on file  Social History Narrative   Not on file   Social Determinants of Health   Financial Resource Strain: Low Risk  (07/21/2021)   Overall Financial Resource Strain (CARDIA)    Difficulty of Paying Living Expenses: Not very hard  Food Insecurity: No Food Insecurity (07/21/2021)   Hunger Vital Sign    Worried About Running Out of Food in the Last Year: Never true    Ran Out of Food in the Last Year: Never true  Transportation Needs: No Transportation Needs (07/21/2021)   PRAPARE - Hydrologist (Medical): No    Lack of Transportation (Non-Medical): No  Physical Activity: Not on file  Stress: Not on file  Social Connections: Not on file  Intimate Partner Violence: Not on file      Family History  Problem Relation Age of Onset   Hypertension Mother    Diabetes Mother    Stroke Mother    Hypertension Other    Diabetes Other     PHYSICAL EXAM: Vitals:   02/20/22 1520  BP: 110/70  Pulse: 72  SpO2: 98%   GENERAL: Well nourished, well developed, and in no apparent distress at rest.  HEENT: Negative for arcus senilis or xanthelasma. There is no scleral  icterus.  The mucous membranes are pink and moist.   NECK: Supple, No masses. Normal carotid upstrokes without bruits. No masses or thyromegaly.    CHEST: There are no chest wall deformities. There is no chest wall tenderness. Respirations are unlabored.  Lungs- CTA B/L CARDIAC:  JVP: 8 cm H2O         Normal S1, S2  Normal rate with regular rhythm. No murmurs, rubs or gallops.  Pulses are 2+ and symmetrical in upper and lower extremities. no edema.  ABDOMEN: Soft, non-tender, non-distended. There are no masses or hepatomegaly. There are normal bowel sounds.  EXTREMITIES: Warm and well perfused with no cyanosis, clubbing.  LYMPHATIC: No axillary or supraclavicular lymphadenopathy.  NEUROLOGIC: Patient is oriented x3 with no focal or lateralizing neurologic deficits.  PSYCH: Patients affect is appropriate, there is no evidence of anxiety or depression.  SKIN: Warm and dry; no lesions or wounds.   DATA REVIEW  ECG: 01/09/22: NSR   ECHO: 02/20/21: LVEF 20%, moderate MR 10/24/21: LVEF 20-25%, mod-severe MR 07/20/21: LVEF 30-35%, Grade II DD, mild MR  CMR: 02/20/21 1. Severely dilated LV, severely reduced LV systolic function, LVEF 14%.  2. There is a thin segment of late gadolinium enhancement in the left ventricular myocardium septal mid wall.  3.  Mild mitral regurgitation.  4.  Normal RV size and function.  5.  Findings consistent with nonischemic dilated cardiomyopathy.  CATH: 07/21/21 No CAD 1: Right atrial pressure-14/9, mean 9 2: Right ventricular pressure-50/7 3: Pulmonary artery pressure-48/27, mean 36 4: Pulmonary wedge pressure-A-wave 35, V wave 43, mean 36 5: LVEDP-33 6: Cardiac output-5.39 L/min with an index of 2.46 L/min/m    ASSESSMENT & PLAN:  Heart failure with reduced EF Etiology of ZO:[id number] NYHA class / AHA Stage:Difficult to gauge her true functional status; based on her reports NYHAIIB, however, I believe she has been fairly inactive due to significant  knee osteoarthritis; more likely at least NYHA III.  Volume status & Diuretics: Euvolemic, continue lasix 40mg  daily Vasodilators: Hives w/ entresto; increase losartan to 50mg  daily Beta-Blocker:Toprol 100mg  daily; start digoxin daily 25mg  daily Cardiometabolic:farxiga 10mg  daily Devices therapies & Valvulopathies:EP referral for primary prevention ICD Advanced therapies:Since June 2023, despite addition of GDMT, Ms. Quinonez has had progression of her cardiomyopathy with LVEF of 20% now. This is not reflected in her functional status though; she reports to feeling better overall. I am concerned about her intermediate to long term prognosis. Given severity of cardiomyopathy, I believe a more objective assessment of her functional status is vital before pursuing any other therapy. Will attempt to obtain CPX (she believes she can perform the test by wearing a knee brace), if unable, plan for RHC. Her MR is moderate on most recent TTE; I do not believe her CMP and LV remodeling will improve significantly by TEER alone. If evaluation for advanced therapies is not indicative for need for LVAD then we can consider TEER.   2. Moderate Mitral regurgitation  - Continue GDMT; will obtain CPX and possibly repeat RHC before deciding on how to move forward.   3. HTN - well controlled now, increase losartan today.   Gianluca Chhim Advanced Heart Failure Mechanical Circulatory Support

## 2022-02-22 ENCOUNTER — Ambulatory Visit (INDEPENDENT_AMBULATORY_CARE_PROVIDER_SITE_OTHER): Payer: 59 | Admitting: Orthopedic Surgery

## 2022-02-22 ENCOUNTER — Encounter: Payer: Self-pay | Admitting: Orthopedic Surgery

## 2022-02-22 DIAGNOSIS — M1711 Unilateral primary osteoarthritis, right knee: Secondary | ICD-10-CM | POA: Diagnosis not present

## 2022-02-22 DIAGNOSIS — M1712 Unilateral primary osteoarthritis, left knee: Secondary | ICD-10-CM

## 2022-02-22 NOTE — Progress Notes (Signed)
Office Visit Note   Patient: Stacey Fletcher           Date of Birth: 1950-02-21           MRN: [id number] Visit Date: 02/22/2022              Requested by: Quitman Livings, MD [address] [place],  [place] [address] PCP: Quitman Livings, MD  Chief Complaint  Patient presents with   Left Knee - Follow-up      HPI: Patient is a 72 year old woman who presents in follow-up for osteoarthritis both knees left worse than right.  Patient has recently been treated for congestive heart failure she states she has valvular insufficiency and may also need a pacemaker.  Assessment & Plan: Visit Diagnoses:  1. Unilateral primary osteoarthritis, right knee   2. Unilateral primary osteoarthritis, left knee     Plan: Recommend patient continue with her cardiac workup.  Discussed with her current cardiac function she would not be a good candidate for total knee replacement.  Both knees were injected she will follow-up as needed.  Follow-Up Instructions: No follow-ups on file.   Ortho Exam  Patient is alert, oriented, no adenopathy, well-dressed, normal affect, normal respiratory effort. Examination patient has difficulty getting from sitting to standing position.  She does have an effusion of the left knee worse in the right knee.  There is crepitation with range of motion of both knees collaterals and cruciates are stable.  She is tender to palpation of the medial and lateral joint line.  Imaging: No results found. No images are attached to the encounter.  Labs: Lab Results  Component Value Date   HGBA1C 6.7 (H) 07/20/2021   HGBA1C 6.8 (H) 05/02/2020   ESRSEDRATE 13 08/03/2009   REPTSTATUS 05/11/2013 FINAL 05/07/2013   CULT  05/07/2013    NO SALMONELLA, SHIGELLA, CAMPYLOBACTER, YERSINIA, OR E.COLI 0157:H7 ISOLATED Performed at [employer] [facility]   [facility] ESCHERICHIA COLI 05/06/2013     Lab Results  Component Value Date   ALBUMIN 3.8 08/10/2021   ALBUMIN 3.7 05/02/2020    ALBUMIN 4.3 08/05/2016    Lab Results  Component Value Date   MG 1.9 07/20/2021   MG 1.7 05/06/2013   MG 1.9 10/20/2011   Lab Results  Component Value Date   VD25OH 30 03/01/2009    No results found for: "PREALBUMIN"    Latest Ref Rng & Units 07/23/2021    2:31 AM 07/22/2021   12:57 AM 07/21/2021    2:21 PM  CBC EXTENDED  WBC 4.0 - 10.5 K/uL 8.6  7.7    RBC 3.87 - 5.11 MIL/uL 4.93  4.51    Hemoglobin 12.0 - 15.0 g/dL 62.1  30.8  65.7   HCT 36.0 - 46.0 % 39.7  36.3  36.0   Platelets 150 - 400 K/uL 241  223       There is no height or weight on file to calculate BMI.  Orders:  No orders of the defined types were placed in this encounter.  No orders of the defined types were placed in this encounter.    Procedures: Large Joint Inj: bilateral knee on 02/22/2022 11:14 AM Indications: pain and diagnostic evaluation Details: 22 G 1.5 in needle, anteromedial approach  Arthrogram: No  Outcome: tolerated well, no immediate complications Procedure, treatment alternatives, risks and benefits explained, specific risks discussed. Consent was given by the patient. Immediately prior to procedure a time out was called to verify the correct patient,  procedure, equipment, support staff and site/side marked as required. Patient was prepped and draped in the usual sterile fashion.      Clinical Data: No additional findings.  ROS:  All other systems negative, except as noted in the HPI. Review of Systems  Objective: Vital Signs: There were no vitals taken for this visit.  Specialty Comments:  No specialty comments available.  PMFS History: Patient Active Problem List   Diagnosis Date Noted   Severe mitral regurgitation 12/14/2021   Acute respiratory failure with hypoxia (HCC) 07/20/2021   Elevated troponin 07/20/2021   Acute CHF (congestive heart failure) (HCC) 07/20/2021   Pneumonia    Osteoarthritis    MI (myocardial infarction) (HCC)    Hypertension    Goiter     Fibromyalgia    Depression    Coronary artery disease    Arthritis of carpometacarpal (CMC) joint of right thumb 07/15/2018   Pain of right hand 07/15/2018   ARF (acute renal failure) (HCC) 08/20/2014   Chest pain 05/06/2013   Nausea vomiting and diarrhea 05/06/2013   Fever 05/06/2013   HTN (hypertension) 05/06/2013   Hypothyroidism 05/06/2013   Diabetes mellitus (HCC) 05/06/2013   Past Medical History:  Diagnosis Date   Coronary artery disease    Depression    Diabetes mellitus    Fibromyalgia    Goiter    Hypertension    MI (myocardial infarction) (HCC)    Osteoarthritis    Pneumonia    10+ years ago.    Family History  Problem Relation Age of Onset   Hypertension Mother    Diabetes Mother    Stroke Mother    Hypertension Other    Diabetes Other     Past Surgical History:  Procedure Laterality Date   ABDOMINAL HYSTERECTOMY     BREAST SURGERY     BUBBLE STUDY  12/14/2021   Procedure: BUBBLE STUDY;  Surgeon: Christell Constant, MD;  Location: [facility] ENDOSCOPY;  Service: Cardiovascular;;   CARDIAC CATHETERIZATION     1995   RIGHT/LEFT HEART CATH AND CORONARY ANGIOGRAPHY N/A 07/21/2021   Procedure: RIGHT/LEFT HEART CATH AND CORONARY ANGIOGRAPHY;  Surgeon: Runell Gess, MD;  Location: [facility] INVASIVE CV LAB;  Service: Cardiovascular;  Laterality: N/A;   TEE WITHOUT CARDIOVERSION N/A 12/14/2021   Procedure: TRANSESOPHAGEAL ECHOCARDIOGRAM (TEE);  Surgeon: Christell Constant, MD;  Location: [facility] ENDOSCOPY;  Service: Cardiovascular;  Laterality: N/A;   Social History   Occupational History   Occupation: retired  Tobacco Use   Smoking status: Former    Types: Cigarettes    Quit date: 07/07/2018    Years since quitting: 3.6   Smokeless tobacco: Never  Vaping Use   Vaping Use: Never used  Substance and Sexual Activity   Alcohol use: No   Drug use: No   Sexual activity: Not Currently

## 2022-02-27 ENCOUNTER — Ambulatory Visit: Payer: 59

## 2022-02-27 ENCOUNTER — Telehealth: Payer: Self-pay

## 2022-02-27 NOTE — Therapy (Incomplete)
OUTPATIENT PHYSICAL THERAPY TREATMENT NOTE   Patient Name: Stacey Fletcher MRN: [id number] DOB:09/26/1950, 72 y.o., female Today's Date: 02/27/2022  PCP: Quitman Livings, MD  REFERRING PROVIDER: Nadara Mustard, MD   END OF SESSION:    Past Medical History:  Diagnosis Date   Coronary artery disease    Depression    Diabetes mellitus    Fibromyalgia    Goiter    Hypertension    MI (myocardial infarction) (HCC)    Osteoarthritis    Pneumonia    10+ years ago.   Past Surgical History:  Procedure Laterality Date   ABDOMINAL HYSTERECTOMY     BREAST SURGERY     BUBBLE STUDY  12/14/2021   Procedure: BUBBLE STUDY;  Surgeon: Christell Constant, MD;  Location: [facility] ENDOSCOPY;  Service: Cardiovascular;;   CARDIAC CATHETERIZATION     1995   RIGHT/LEFT HEART CATH AND CORONARY ANGIOGRAPHY N/A 07/21/2021   Procedure: RIGHT/LEFT HEART CATH AND CORONARY ANGIOGRAPHY;  Surgeon: Runell Gess, MD;  Location: [facility] INVASIVE CV LAB;  Service: Cardiovascular;  Laterality: N/A;   TEE WITHOUT CARDIOVERSION N/A 12/14/2021   Procedure: TRANSESOPHAGEAL ECHOCARDIOGRAM (TEE);  Surgeon: Christell Constant, MD;  Location[facility]COPY;  Service: Cardiovascular;  Laterality: N/A;   Patient Active Problem List   Diagnosis Date Noted   Severe mitral regurgitation 12/14/2021   Acute respiratory failure with hypoxia (HCC) 07/20/2021   Elevated troponin 07/20/2021   Acute CHF (congestive heart failure) (HCC) 07/20/2021   Pneumonia    Osteoarthritis    MI (myocardial infarction) (HCC)    Hypertension    Goiter    Fibromyalgia    Depression    Coronary artery disease    Arthritis of carpometacarpal (CMC) joint of right thumb 07/15/2018   Pain of right hand 07/15/2018   ARF (acute renal failure) (HCC) 08/20/2014   Chest pain 05/06/2013   Nausea vomiting and diarrhea 05/06/2013   Fever 05/06/2013   HTN (hypertension) 05/06/2013   Hypothyroidism 05/06/2013   Diabetes mellitus (HCC) 05/06/2013     REFERRING DIAG: M17.11 (ICD-10-CM) - Unilateral primary osteoarthritis, right knee M17.12 (ICD-10-CM) - Unilateral primary osteoarthritis, left knee  THERAPY DIAG:  No diagnosis found.  Rationale for Evaluation and Treatment Rehabilitation  PERTINENT HISTORY: DM II, HTN, MI   PRECAUTIONS: None  SUBJECTIVE:                                                                                                                                                                                      SUBJECTIVE STATEMENT:  ***   PAIN:  Are you having pain?  Yes: NPRS scale: ***8/10 Worst: 10/10 Pain location: bilateral knees,  L>R Pain description: sharp Aggravating factors: prolonging walking, prolonged standing Relieving factors: None   OBJECTIVE: (objective measures completed at initial evaluation unless otherwise dated)   DIAGNOSTIC FINDINGS:             See imaging   PATIENT SURVEYS:  FOTO: 17% function; 48% predicted   COGNITION: Overall cognitive status: Within functional limits for tasks assessed                         SENSATION: WFL   POSTURE: rounded shoulders, forward head, and knee varus   PALPATION: TTP to medial knee   LOWER EXTREMITY ROM:   Active ROM Right eval Left eval  Hip flexion      Hip extension      Hip abduction      Hip adduction      Hip internal rotation      Hip external rotation      Knee flexion 101 84  Knee extension 8 12  Ankle dorsiflexion      Ankle plantarflexion      Ankle inversion      Ankle eversion       (Blank rows = not tested)   LOWER EXTREMITY MMT:   MMT Right eval Left eval  Hip flexion      Hip extension      Hip abduction 3+/5 3+/5  Hip adduction      Hip internal rotation      Hip external rotation      Knee flexion 4/5 4/5  Knee extension 4/5 4/5  Ankle dorsiflexion      Ankle plantarflexion      Ankle inversion      Ankle eversion       (Blank rows = not tested)   LOWER EXTREMITY SPECIAL  TESTS:  DNT   FUNCTIONAL TESTS:  5 times Sit to Stand: 26 seconds   GAIT: Distance walked: 93ft Assistive device utilized: None Level of assistance: SBA Comments: antalgic gait on L, slowed gait speed     TREATMENT: [facility] Adult PT Treatment:                                                DATE: 02/27/2022 Therapeutic Exercise: Nustep level 5 x 5 mins Mini squats? Standing hip abduction? LAQ Supine quad set Supine heel slide Supine clamshell Supine marching Supine SLR Bridges? SAQ? STS  [facility] Adult PT Treatment:                                                DATE: 02/13/2022 Therapeutic Exercise: Supine quad set x 5 - 5" hold Supine SLR x 5 each  Supine heel slide x 5 each Seated clamshell x 10 GTB LAQ x 5 each   PATIENT EDUCATION:  Education details: eval findings, FOTO, HEP, POC Person educated: Patient and son Education method: Explanation, Demonstration, and Handouts Education comprehension: verbalized understanding and returned demonstration   HOME EXERCISE PROGRAM: Access Code: [email or web address] URL: [email or web address] Date: 02/13/2022 Prepared by: Edwinna Areola   Exercises - Supine Quadricep Sets  - 1-2 x daily - 7 x weekly - 2 sets - 10 reps - 5 sec hold - Active Straight Leg  Raise with Quad Set  - 1-2 x daily - 7 x weekly - 2 sets - 10 reps - Supine Heel Slide  - 1-2 x daily - 7 x weekly - 2 sets - 10 reps - 5 sec hold - Seated Hip Abduction with Resistance  - 1-2 x daily - 7 x weekly - 3 sets - 10 reps - green theraband hold - Seated Long Arc Quad  - 1-2 x daily - 7 x weekly - 2-3 sets - 10 reps   ASSESSMENT:   CLINICAL IMPRESSION: ***  Patient is a 72 y.o. F who was seen today for physical therapy evaluation and treatment for bilateral knee pain and discomfort. Physical findings are consistent with MD impression as pt has decrease in LE strength and knee ROM. She would benefit from skilled PT working on improving quad strength and knee ROM in  preparation for possible upcoming TKA per MD. Will progress as tolerated per POC.     OBJECTIVE IMPAIRMENTS: Abnormal gait, decreased activity tolerance, decreased balance, decreased endurance, decreased mobility, difficulty walking, decreased ROM, decreased strength, and pain   ACTIVITY LIMITATIONS: carrying, lifting, standing, squatting, and transfers   PARTICIPATION LIMITATIONS: cleaning, laundry, driving, shopping, community activity, occupation, and yard work   PERSONAL FACTORS: Fitness, Time since onset of injury/illness/exacerbation, and 3+ comorbidities: DM II, HTN, MI  are also affecting patient's functional outcome.    REHAB POTENTIAL: Good   CLINICAL DECISION MAKING: Evolving/moderate complexity   EVALUATION COMPLEXITY: Moderate     GOALS: Goals reviewed with patient? No   SHORT TERM GOALS: Target date: 03/06/2022   Pt will be compliant and knowledgeable with initial HEP for improved comfort and carryover Baseline: initial HEP given  Goal status: INITIAL   2.  Pt will self report bilateral knee pain no greater than 6/10 for improved comfort and functional ability Baseline: 10/10 at worst Goal status: INITIAL    LONG TERM GOALS: Target date: 04/10/2022   Pt will improve FOTO function score to no less than 48% as proxy for functional improvement Baseline: 47% function Goal status: INITIAL    2.  Pt will self report bilateral knee pain no greater than 3/10 for improved comfort and functional ability Baseline: 10/10 at worst Goal status: INITIAL    3.  Pt will decrease Five Time Sit to Stand to no greater than 20 seconds in order to improve comfort and functional mobility Baseline: 26 seconds  Goal status: INITIAL    4.  Pt will improve bilateral knee ROM to at least 5-110 for improved comfort and functional mobility Baseline: see chart Goal status: INITIAL   PLAN:   PT FREQUENCY: 2x/week   PT DURATION: 8 weeks   PLANNED INTERVENTIONS: Therapeutic exercises,  Therapeutic activity, Neuromuscular re-education, Balance training, Gait training, Patient/Family education, Self Care, Joint mobilization, Aquatic Therapy, Dry Needling, Electrical stimulation, Cryotherapy, Moist heat, Vasopneumatic device, Manual therapy, and Re-evaluation   PLAN FOR NEXT SESSION: assess HEP response, progress quad strength and knee ROM, functional mobility    Berta Minor, PTA 02/27/2022, 8:50 AM

## 2022-02-27 NOTE — Telephone Encounter (Signed)
Attempted to call patient regarding missed visits. Unable to leave voicemail.   1st no-show. Patient does not have any further appointments scheduled for PT at this time.  Margarette Canada, PTA 02/27/22 2:29 PM

## 2022-03-02 ENCOUNTER — Other Ambulatory Visit (HOSPITAL_COMMUNITY): Payer: Medicare Other

## 2022-03-14 NOTE — Therapy (Unsigned)
OUTPATIENT PHYSICAL THERAPY LOWER EXTREMITY EVALUATION   Patient Name: Stacey Fletcher MRN: [id number] DOB:30-Sep-1950, 72 y.o., female Today's Date: 03/14/2022  END OF SESSION:   Past Medical History:  Diagnosis Date   Coronary artery disease    Depression    Diabetes mellitus    Fibromyalgia    Goiter    Hypertension    MI (myocardial infarction) (HCC)    Osteoarthritis    Pneumonia    10+ years ago.   Past Surgical History:  Procedure Laterality Date   ABDOMINAL HYSTERECTOMY     BREAST SURGERY     BUBBLE STUDY  12/14/2021   Procedure: BUBBLE STUDY;  Surgeon: Christell Constant, MD;  Location: [facility];  Service: Cardiovascular;;   CARDIAC CATHETERIZATION     1995   RIGHT/LEFT HEART CATH AND CORONARY ANGIOGRAPHY N/A 07/21/2021   Procedure: RIGHT/LEFT HEART CATH AND CORONARY ANGIOGRAPHY;  Surgeon: Runell Gess, MD;  Location: [facility];  Service: Cardiovascular;  Laterality: N/A;   TEE WITHOUT CARDIOVERSION N/A 12/14/2021   Procedure: TRANSESOPHAGEAL ECHOCARDIOGRAM (TEE);  Surgeon: Christell Constant, MD;  Location: [facility] ENDOSCOPY;  Service: Cardiovascular;  Laterality: N/A;   Patient Active Problem List   Diagnosis Date Noted   Severe mitral regurgitation 12/14/2021   Acute respiratory failure with hypoxia (HCC) 07/20/2021   Elevated troponin 07/20/2021   Acute CHF (congestive heart failure) (HCC) 07/20/2021   Pneumonia    Osteoarthritis    MI (myocardial infarction) (HCC)    Hypertension    Goiter    Fibromyalgia    Depression    Coronary artery disease    Arthritis of carpometacarpal (CMC) joint of right thumb 07/15/2018   Pain of right hand 07/15/2018   ARF (acute renal failure) (HCC) 08/20/2014   Chest pain 05/06/2013   Nausea vomiting and diarrhea 05/06/2013   Fever 05/06/2013   HTN (hypertension) 05/06/2013   Hypothyroidism 05/06/2013   Diabetes mellitus (HCC) 05/06/2013    PCP: Quitman Livings, MDPCP - General   REFERRING  PROVIDER: Nadara Mustard, MD   REFERRING DIAG: M17.11 (ICD-10-CM) - Unilateral primary osteoarthritis, right knee M17.12 (ICD-10-CM) - Unilateral primary osteoarthritis, left knee  THERAPY DIAG:  No diagnosis found.  Rationale for Evaluation and Treatment: Rehabilitation  ONSET DATE: chronic  SUBJECTIVE:   SUBJECTIVE STATEMENT: ***  PERTINENT HISTORY: Patient is a 72 year old woman with chronic osteoarthritis both knees. Patient has had recent medical problems including heart failure and patient states she may need to have mitral valve surgery. PAIN:  Are you having pain? {OPRCPAIN:27236}  PRECAUTIONS: Other: cardiac  WEIGHT BEARING RESTRICTIONS: No  FALLS:  Has patient fallen in last 6 months? No  LIVING ENVIRONMENT: Lives with: {[facility] lives with:25569::"lives with their family"} Lives in: {Lives in:25570} Stairs: {opstairs:27293} Has following equipment at home: {Assistive devices:23999}  OCCUPATION: retired  PLOF: {PLOF:24004}  PATIENT GOALS: ***  NEXT MD VISIT: ***  OBJECTIVE:   DIAGNOSTIC FINDINGS: XR Knee 1-2 Views Left   Result Date: 01/26/2022 2 view radiographs of the left knee shows tricompartmental arthritic changes with bone-on-bone contact medial joint line periarticular bony spurs in all 3 compartments with subchondral cyst and sclerosis.   XR Knee 1-2 Views Right   Result Date: 01/26/2022 2 view radiographs of the right knee shows tricompartmental arthritic changes with bone-on-bone contact medial joint line periarticular bony spurs in all 3 compartments with subchondral cyst and sclerosis.  No images are attached to the encounter.  PATIENT SURVEYS:  FOTO ***  MUSCLE LENGTH: Hamstrings:  Right *** deg; Left *** deg Maisie Fus test: Right *** deg; Left *** deg  POSTURE: {posture:25561}  PALPATION: ***  LOWER EXTREMITY ROM:  {AROM/PROM:27142} ROM Right eval Left eval  Hip flexion    Hip extension    Hip abduction    Hip adduction     Hip internal rotation    Hip external rotation    Knee flexion    Knee extension    Ankle dorsiflexion    Ankle plantarflexion    Ankle inversion    Ankle eversion     (Blank rows = not tested)  LOWER EXTREMITY MMT:  MMT Right eval Left eval  Hip flexion    Hip extension    Hip abduction    Hip adduction    Hip internal rotation    Hip external rotation    Knee flexion    Knee extension    Ankle dorsiflexion    Ankle plantarflexion    Ankle inversion    Ankle eversion     (Blank rows = not tested)  LOWER EXTREMITY SPECIAL TESTS:  {LEspecialtests:26242}  FUNCTIONAL TESTS:  {Functional tests:24029}  GAIT: Distance walked: *** Assistive device utilized: {Assistive devices:23999} Level of assistance: {Levels of assistance:24026} Comments: ***   TODAY'S TREATMENT:                                                                                                                              DATE: ***    PATIENT EDUCATION:  Education details: *** Person educated: {Person educated:25204} Education method: {Education Method:25205} Education comprehension: {Education Comprehension:25206}  HOME EXERCISE PROGRAM: ***  ASSESSMENT:  CLINICAL IMPRESSION: Patient is a *** y.o. *** who was seen today for physical therapy evaluation and treatment for ***.   OBJECTIVE IMPAIRMENTS: {opptimpairments:25111}.   ACTIVITY LIMITATIONS: {activitylimitations:27494}  PARTICIPATION LIMITATIONS: {participationrestrictions:25113}  PERSONAL FACTORS: {Personal factors:25162} are also affecting patient's functional outcome.   REHAB POTENTIAL: {rehabpotential:25112}  CLINICAL DECISION MAKING: {clinical decision making:25114}  EVALUATION COMPLEXITY: {Evaluation complexity:25115}   GOALS: Goals reviewed with patient? {yes/no:20286}  SHORT TERM GOALS: Target date: *** *** Baseline: Goal status: {GOALSTATUS:25110}  2.  *** Baseline:  Goal status: {GOALSTATUS:25110}  3.   *** Baseline:  Goal status: {GOALSTATUS:25110}  4.  *** Baseline:  Goal status: {GOALSTATUS:25110}  5.  *** Baseline:  Goal status: {GOALSTATUS:25110}  6.  *** Baseline:  Goal status: {GOALSTATUS:25110}  LONG TERM GOALS: Target date: ***  *** Baseline:  Goal status: {GOALSTATUS:25110}  2.  *** Baseline:  Goal status: {GOALSTATUS:25110}  3.  *** Baseline:  Goal status: {GOALSTATUS:25110}  4.  *** Baseline:  Goal status: {GOALSTATUS:25110}  5.  *** Baseline:  Goal status: {GOALSTATUS:25110}  6.  *** Baseline:  Goal status: {GOALSTATUS:25110}   PLAN:  PT FREQUENCY: {rehab frequency:25116}  PT DURATION: {rehab duration:25117}  PLANNED INTERVENTIONS: {rehab planned interventions:25118::"Therapeutic exercises","Therapeutic activity","Neuromuscular re-education","Balance training","Gait training","Patient/Family education","Self Care","Joint mobilization"}  PLAN FOR NEXT SESSION: ***   Hildred Laser, PT 03/14/2022, 4:47 PM

## 2022-03-15 ENCOUNTER — Ambulatory Visit: Payer: 59 | Attending: Internal Medicine

## 2022-03-15 DIAGNOSIS — M1712 Unilateral primary osteoarthritis, left knee: Secondary | ICD-10-CM | POA: Diagnosis not present

## 2022-03-15 DIAGNOSIS — M1711 Unilateral primary osteoarthritis, right knee: Secondary | ICD-10-CM | POA: Insufficient documentation

## 2022-03-15 DIAGNOSIS — M25561 Pain in right knee: Secondary | ICD-10-CM | POA: Insufficient documentation

## 2022-03-15 DIAGNOSIS — R2689 Other abnormalities of gait and mobility: Secondary | ICD-10-CM | POA: Diagnosis present

## 2022-03-15 DIAGNOSIS — M6281 Muscle weakness (generalized): Secondary | ICD-10-CM | POA: Diagnosis present

## 2022-03-15 DIAGNOSIS — M25562 Pain in left knee: Secondary | ICD-10-CM | POA: Diagnosis present

## 2022-03-15 DIAGNOSIS — G8929 Other chronic pain: Secondary | ICD-10-CM | POA: Diagnosis present

## 2022-03-15 NOTE — Therapy (Signed)
OUTPATIENT PHYSICAL THERAPY TREATMENT NOTE   Patient Name: Stacey Fletcher MRN: [id number] DOB:04/05/1950, 72 y.o., female Today's Date: 03/15/2022  PCP: Quitman Livings, MD  REFERRING PROVIDER: Nadara Mustard, MD   END OF SESSION:   PT End of Session - 03/15/22 1841     Visit Number 2    Number of Visits 17    Date for PT Re-Evaluation 04/10/22    Authorization Type [employer] Medicare    PT Start Time 1840    PT Stop Time 1910    PT Time Calculation (min) 30 min    Activity Tolerance Patient tolerated treatment well    Behavior During Therapy WFL for tasks assessed/performed             Past Medical History:  Diagnosis Date   Coronary artery disease    Depression    Diabetes mellitus    Fibromyalgia    Goiter    Hypertension    MI (myocardial infarction) (HCC)    Osteoarthritis    Pneumonia    10+ years ago.   Past Surgical History:  Procedure Laterality Date   ABDOMINAL HYSTERECTOMY     BREAST SURGERY     BUBBLE STUDY  12/14/2021   Procedure: BUBBLE STUDY;  Surgeon: Christell Constant, MD;  Location: [facility];  Service: Cardiovascular;;   CARDIAC CATHETERIZATION     1995   RIGHT/LEFT HEART CATH AND CORONARY ANGIOGRAPHY N/A 07/21/2021   Procedure: RIGHT/LEFT HEART CATH AND CORONARY ANGIOGRAPHY;  Surgeon: Runell Gess, MD;  [facility]: [facility];  Service: Cardiovascular;  Laterality: N/A;   TEE WITHOUT CARDIOVERSION N/A 12/14/2021   Procedure: TRANSESOPHAGEAL ECHOCARDIOGRAM (TEE);  Surgeon: Christell Constant, MD;  Location: [facility] ENDOSCOPY;  Service: Cardiovascular;  Laterality: N/A;   Patient Active Problem List   Diagnosis Date Noted   Severe mitral regurgitation 12/14/2021   Acute respiratory failure with hypoxia (HCC) 07/20/2021   Elevated troponin 07/20/2021   Acute CHF (congestive heart failure) (HCC) 07/20/2021   Pneumonia    Osteoarthritis    MI (myocardial infarction) (HCC)    Hypertension    Goiter    Fibromyalgia    Depression     Coronary artery disease    Arthritis of carpometacarpal [facility]) joint of right thumb 07/15/2018   Pain of right hand 07/15/2018   ARF (acute renal failure) (HCC) 08/20/2014   Chest pain 05/06/2013   Nausea vomiting and diarrhea 05/06/2013   Fever 05/06/2013   HTN (hypertension) 05/06/2013   Hypothyroidism 05/06/2013   Diabetes mellitus (HCC) 05/06/2013    REFERRING DIAG: M17.11 (ICD-10-CM) - Unilateral primary osteoarthritis, right knee M17.12 (ICD-10-CM) - Unilateral primary osteoarthritis, left knee    THERAPY DIAG:  Chronic pain of left knee  Chronic pain of right knee  Muscle weakness (generalized)  Rationale for Evaluation and Treatment Rehabilitation  PERTINENT HISTORY: DM II, HTN, MI   PRECAUTIONS: cardiac  SUBJECTIVE:  SUBJECTIVE STATEMENT:  B knee pain hovering around 5/10, unable to walk long distances   PAIN:  Are you having pain? Yes: NPRS scale: 5/10 Pain location: B knees Pain description: ache Aggravating factors: weight bearing tasks Relieving factors: rest and meds   OBJECTIVE: (objective measures completed at initial evaluation unless otherwise dated)   DIAGNOSTIC FINDINGS:             See imaging   PATIENT SURVEYS:  FOTO: 17% function; 48% predicted   COGNITION: Overall cognitive status: Within functional limits for tasks assessed                         SENSATION: WFL   POSTURE: rounded shoulders, forward head, and knee varus   PALPATION: TTP to medial knee   LOWER EXTREMITY ROM:   Active ROM Right eval Left eval R 03/15/22 L 03/15/22  Hip flexion        Hip extension        Hip abduction        Hip adduction        Hip internal rotation        Hip external rotation        Knee flexion 101 84 112d 102d  Knee extension 8 12 -4d -4  Ankle  dorsiflexion        Ankle plantarflexion        Ankle inversion        Ankle eversion         (Blank rows = not tested)   LOWER EXTREMITY MMT:   MMT Right eval Left eval  Hip flexion      Hip extension      Hip abduction 3+/5 3+/5  Hip adduction      Hip internal rotation      Hip external rotation      Knee flexion 4/5 4/5  Knee extension 4/5 4/5  Ankle dorsiflexion      Ankle plantarflexion      Ankle inversion      Ankle eversion       (Blank rows = not tested)   LOWER EXTREMITY SPECIAL TESTS:  DNT   FUNCTIONAL TESTS:  5 times Sit to Stand: 26 seconds   GAIT: Distance walked: 85ft Assistive device utilized: None Level of assistance: SBA Comments: antalgic gait on L, slowed gait speed     TREATMENT: [facility] Adult PT Treatment:                                                DATE: 03/15/22 Therapeutic Exercise: Nustep L2 6 min FAQs with adduction 15x2 B QS's 3s hold 15x Bridge with ball 15x SLR 15x B Heel slides on slide board 15x B  Supine hip fallouts RTB 15x   [facility] Adult PT Treatment:                                                DATE: 02/13/2022 Therapeutic Exercise: Supine quad set x 5 - 5" hold Supine SLR x 5 each  Supine heel slide x 5 each Seated clamshell x 10 GTB LAQ x 5 each   PATIENT EDUCATION:  Education details: eval findings, FOTO, HEP,  POC Person educated: Patient and son Education method: Explanation, Demonstration, and Handouts Education comprehension: verbalized understanding and returned demonstration   HOME EXERCISE PROGRAM: Access Code: [email or web address] URL: [email or web address] Date: 02/13/2022 Prepared by: Edwinna Areola   Exercises - Supine Quadricep Sets  - 1-2 x daily - 7 x weekly - 2 sets - 10 reps - 5 sec hold - Active Straight Leg Raise with Quad Set  - 1-2 x daily - 7 x weekly - 2 sets - 10 reps - Supine Heel Slide  - 1-2 x daily - 7 x weekly - 2 sets - 10 reps - 5 sec hold - Seated Hip Abduction with Resistance   - 1-2 x daily - 7 x weekly - 3 sets - 10 reps - green theraband hold - Seated Long Arc Quad  - 1-2 x daily - 7 x weekly - 2-3 sets - 10 reps   ASSESSMENT:   CLINICAL IMPRESSION:First f/u session since Eval, no changes to reports.  Today's session reviewed HEP and added aerobic work.  Continued quad strengthening and ROM tasks. Despite limited attendance, AROM has increased.  Patient able to tolerate all tasks well.  Session limited due due late arrival.   OBJECTIVE IMPAIRMENTS: Abnormal gait, decreased activity tolerance, decreased balance, decreased endurance, decreased mobility, difficulty walking, decreased ROM, decreased strength, and pain   ACTIVITY LIMITATIONS: carrying, lifting, standing, squatting, and transfers   PARTICIPATION LIMITATIONS: cleaning, laundry, driving, shopping, community activity, occupation, and yard work   PERSONAL FACTORS: Fitness, Time since onset of injury/illness/exacerbation, and 3+ comorbidities: DM II, HTN, MI  are also affecting patient's functional outcome.    REHAB POTENTIAL: Good   CLINICAL DECISION MAKING: Evolving/moderate complexity   EVALUATION COMPLEXITY: Moderate     GOALS: Goals reviewed with patient? No   SHORT TERM GOALS: Target date: 03/06/2022   Pt will be compliant and knowledgeable with initial HEP for improved comfort and carryover Baseline: initial HEP given  Goal status: INITIAL   2.  Pt will self report bilateral knee pain no greater than 6/10 for improved comfort and functional ability Baseline: 10/10 at worst Goal status: INITIAL    LONG TERM GOALS: Target date: 04/10/2022   Pt will improve FOTO function score to no less than 48% as proxy for functional improvement Baseline: 47% function Goal status: INITIAL    2.  Pt will self report bilateral knee pain no greater than 3/10 for improved comfort and functional ability Baseline: 10/10 at worst Goal status: INITIAL    3.  Pt will decrease Five Time Sit to Stand to no  greater than 20 seconds in order to improve comfort and functional mobility Baseline: 26 seconds  Goal status: INITIAL    4.  Pt will improve bilateral knee ROM to at least 5-110 for improved comfort and functional mobility Baseline: see chart Goal status: INITIAL   PLAN:   PT FREQUENCY: 2x/week   PT DURATION: 8 weeks   PLANNED INTERVENTIONS: Therapeutic exercises, Therapeutic activity, Neuromuscular re-education, Balance training, Gait training, Patient/Family education, Self Care, Joint mobilization, Aquatic Therapy, Dry Needling, Electrical stimulation, Cryotherapy, Moist heat, Vasopneumatic device, Manual therapy, and Re-evaluation   PLAN FOR NEXT SESSION: assess HEP response, progress quad strength and knee ROM, functional mobility      Hildred Laser, PT 03/15/2022, 6:46 PM

## 2022-03-21 ENCOUNTER — Ambulatory Visit: Payer: 59

## 2022-03-21 DIAGNOSIS — R2689 Other abnormalities of gait and mobility: Secondary | ICD-10-CM

## 2022-03-21 DIAGNOSIS — M25562 Pain in left knee: Secondary | ICD-10-CM | POA: Diagnosis not present

## 2022-03-21 DIAGNOSIS — G8929 Other chronic pain: Secondary | ICD-10-CM

## 2022-03-21 DIAGNOSIS — M6281 Muscle weakness (generalized): Secondary | ICD-10-CM

## 2022-03-21 NOTE — Therapy (Signed)
OUTPATIENT PHYSICAL THERAPY TREATMENT NOTE   Patient Name: Stacey Fletcher MRN: [id number] DOB:05/20/1950, 72 y.o., female Today's Date: 03/21/2022  PCP: Quitman Livings, MD  REFERRING PROVIDER: Nadara Mustard, MD   END OF SESSION:   PT End of Session - 03/21/22 1618     Visit Number 3    Number of Visits 17    Date for PT Re-Evaluation 04/10/22    Authorization Type [employer] Medicare    PT Start Time 1617    PT Stop Time 1657    PT Time Calculation (min) 40 min    Activity Tolerance Patient tolerated treatment well    Behavior During Therapy WFL for tasks assessed/performed              Past Medical History:  Diagnosis Date   Coronary artery disease    Depression    Diabetes mellitus    Fibromyalgia    Goiter    Hypertension    MI (myocardial infarction) (HCC)    Osteoarthritis    Pneumonia    10+ years ago.   Past Surgical History:  Procedure Laterality Date   ABDOMINAL HYSTERECTOMY     BREAST SURGERY     BUBBLE STUDY  12/14/2021   Procedure: BUBBLE STUDY;  Surgeon: Christell Constant, MD;  Location: [facility] ENDOS[facility];  Service: Cardiovascular;;   CARDIAC CATHETERIZATION     1995   RIGHT/LEFT HEART CATH AND CORONARY ANGIOGRAPHY N/A 07/21/2021   Procedure: RIGHT/LEFT HEART CATH AND CORONARY ANGIOGRAPHY;  Surgeon: Runell Gess, MD;  Location: [facility];  Service: Cardiovascular;  Laterality: N/A;   TEE WITHOUT CARDIOVERSION N/A 12/14/2021   Procedure: TRANSESOPHAGEAL ECHOCARDIOGRAM (TEE);  Surgeon: Christell Constant, MD;  Location: [facility] ENDOSCOPY;  Service: Cardiovascular;  Laterality: N/A;   Patient Active Problem List   Diagnosis Date Noted   Severe mitral regurgitation 12/14/2021   Acute respiratory failure with hypoxia (HCC) 07/20/2021   Elevated troponin 07/20/2021   Acute CHF (congestive heart failure) (HCC) 07/20/2021   Pneumonia    Osteoarthritis    MI (myocardial infarction) (HCC)    Hypertension    Goiter    Fibromyalgia     Depression    Coronary artery disease    Arthritis of carpometacarpal [facility]) joint of right thumb 07/15/2018   Pain of right hand 07/15/2018   ARF (acute renal failure) (HCC) 08/20/2014   Chest pain 05/06/2013   Nausea vomiting and diarrhea 05/06/2013   Fever 05/06/2013   HTN (hypertension) 05/06/2013   Hypothyroidism 05/06/2013   Diabetes mellitus (HCC) 05/06/2013    REFERRING DIAG: M17.11 (ICD-10-CM) - Unilateral primary osteoarthritis, right knee M17.12 (ICD-10-CM) - Unilateral primary osteoarthritis, left knee    THERAPY DIAG:  Chronic pain of left knee  Chronic pain of right knee  Muscle weakness (generalized)  Other abnormalities of gait and mobility  Rationale for Evaluation and Treatment Rehabilitation  PERTINENT HISTORY: DM II, HTN, MI   PRECAUTIONS: cardiac  SUBJECTIVE:  SUBJECTIVE STATEMENT:  Patient reports continued knee pain, L>R.   PAIN:  Are you having pain? Yes: NPRS scale: 8/10 Pain location: B knees Pain description: ache Aggravating factors: weight bearing tasks Relieving factors: rest and meds   OBJECTIVE: (objective measures completed at initial evaluation unless otherwise dated)   DIAGNOSTIC FINDINGS:             See imaging   PATIENT SURVEYS:  FOTO: 17% function; 48% predicted   COGNITION: Overall cognitive status: Within functional limits for tasks assessed                         SENSATION: WFL   POSTURE: rounded shoulders, forward head, and knee varus   PALPATION: TTP to medial knee   LOWER EXTREMITY ROM:   Active ROM Right eval Left eval R 03/15/22 L 03/15/22  Hip flexion        Hip extension        Hip abduction        Hip adduction        Hip internal rotation        Hip external rotation        Knee flexion 101 84 112d 102d  Knee  extension 8 12 -4d -4  Ankle dorsiflexion        Ankle plantarflexion        Ankle inversion        Ankle eversion         (Blank rows = not tested)   LOWER EXTREMITY MMT:   MMT Right eval Left eval  Hip flexion      Hip extension      Hip abduction 3+/5 3+/5  Hip adduction      Hip internal rotation      Hip external rotation      Knee flexion 4/5 4/5  Knee extension 4/5 4/5  Ankle dorsiflexion      Ankle plantarflexion      Ankle inversion      Ankle eversion       (Blank rows = not tested)   LOWER EXTREMITY SPECIAL TESTS:  DNT   FUNCTIONAL TESTS:  5 times Sit to Stand: 26 seconds   GAIT: Distance walked: 80ft Assistive device utilized: None Level of assistance: SBA Comments: antalgic gait on L, slowed gait speed     TREATMENT: [facility] Adult PT Treatment:                                                DATE: 03/21/22 Therapeutic Exercise: Nustep L5 6 min FAQs with adduction 15x2 Seated hamstring curl RTB x15 BIL Seated hamstring stretch 2x30" BIL B QS's 3s hold 15x Supine hip adduction ball squeeze 5" hold x15 Bridge with ball 15x SLR 15x B Supine hip fallouts RTB 15x  [facility] Adult PT Treatment:                                                DATE: 03/15/22 Therapeutic Exercise: Nustep L2 6 min FAQs with adduction 15x2 B QS's 3s hold 15x Bridge with ball 15x SLR 15x B Heel slides on slide board 15x B  Supine hip fallouts RTB 15x  [facility] Adult PT Treatment:                                                DATE: 02/13/2022 Therapeutic Exercise: Supine quad set x 5 - 5" hold Supine SLR x 5 each  Supine heel slide x 5 each Seated clamshell x 10 GTB LAQ x 5 each   PATIENT EDUCATION:  Education details: eval findings, FOTO, HEP, POC Person educated: Patient and son Education method: Explanation, Demonstration, and Handouts Education comprehension: verbalized understanding and returned demonstration   HOME EXERCISE PROGRAM: Access Code: [email or web address] URL:  [email or web address] Date: 02/13/2022 Prepared by: Edwinna Areola   Exercises - Supine Quadricep Sets  - 1-2 x daily - 7 x weekly - 2 sets - 10 reps - 5 sec hold - Active Straight Leg Raise with Quad Set  - 1-2 x daily - 7 x weekly - 2 sets - 10 reps - Supine Heel Slide  - 1-2 x daily - 7 x weekly - 2 sets - 10 reps - 5 sec hold - Seated Hip Abduction with Resistance  - 1-2 x daily - 7 x weekly - 3 sets - 10 reps - green theraband hold - Seated Long Arc Quad  - 1-2 x daily - 7 x weekly - 2-3 sets - 10 reps   ASSESSMENT:   CLINICAL IMPRESSION: Patient presents to PT reporting continued knee pain and occasional HEP compliance. Session today focused on BIL LE strengthening. Patient was able to tolerate all prescribed exercises with no adverse effects. Patient continues to benefit from skilled PT services and should be progressed as able to improve functional independence.    OBJECTIVE IMPAIRMENTS: Abnormal gait, decreased activity tolerance, decreased balance, decreased endurance, decreased mobility, difficulty walking, decreased ROM, decreased strength, and pain   ACTIVITY LIMITATIONS: carrying, lifting, standing, squatting, and transfers   PARTICIPATION LIMITATIONS: cleaning, laundry, driving, shopping, community activity, occupation, and yard work   PERSONAL FACTORS: Fitness, Time since onset of injury/illness/exacerbation, and 3+ comorbidities: DM II, HTN, MI  are also affecting patient's functional outcome.    REHAB POTENTIAL: Good   CLINICAL DECISION MAKING: Evolving/moderate complexity   EVALUATION COMPLEXITY: Moderate     GOALS: Goals reviewed with patient? No   SHORT TERM GOALS: Target date: 03/06/2022   Pt will be compliant and knowledgeable with initial HEP for improved comfort and carryover Baseline: initial HEP given  Goal status: Ongoing Pt reports occasional adherence 03/21/22   2.  Pt will self report bilateral knee pain no greater than 6/10 for  improved comfort and functional ability Baseline: 10/10 at worst Goal status: Ongoing   LONG TERM GOALS: Target date: 04/10/2022   Pt will improve FOTO function score to no less than 48% as proxy for functional improvement Baseline: 47% function Goal status: INITIAL    2.  Pt will self report bilateral knee pain no greater than 3/10 for improved comfort and functional ability Baseline: 10/10 at worst Goal status: INITIAL    3.  Pt will decrease Five Time Sit to Stand to no greater than 20 seconds in order to improve comfort and functional mobility Baseline: 26 seconds  Goal status: INITIAL    4.  Pt will improve bilateral knee ROM to at least 5-110 for improved comfort and functional mobility Baseline: see chart Goal status: INITIAL   PLAN:   PT FREQUENCY: 2x/week  PT DURATION: 8 weeks   PLANNED INTERVENTIONS: Therapeutic exercises, Therapeutic activity, Neuromuscular re-education, Balance training, Gait training, Patient/Family education, Self Care, Joint mobilization, Aquatic Therapy, Dry Needling, Electrical stimulation, Cryotherapy, Moist heat, Vasopneumatic device, Manual therapy, and Re-evaluation   PLAN FOR NEXT SESSION: assess HEP response, progress quad strength and knee ROM, functional mobility      Berta Minor, PTA 03/21/2022, 4:56 PM

## 2022-03-22 ENCOUNTER — Ambulatory Visit: Payer: Medicaid Other | Admitting: Physical Therapy

## 2022-03-22 NOTE — Therapy (Deleted)
OUTPATIENT PHYSICAL THERAPY TREATMENT NOTE   Patient Name: Lisbed Coward MRN: [id number] DOB:[birth year], 72 y.o., female Today's Date: 03/21/2022  PCP: Quitman Livings, MD  REFERRING PROVIDER: Nadara Mustard, MD   END OF SESSION:   PT End of Session - 03/21/22 1618     Visit Number 3    Number of Visits 17    Date for PT Re-Evaluation 04/10/22    Authorization Type [employer] Medicare    PT Start Time 1617    PT Stop Time 1657    PT Time Calculation (min) 40 min    Activity Tolerance Patient tolerated treatment well    Behavior During Therapy WFL for tasks assessed/performed              Past Medical History:  Diagnosis Date   Coronary artery disease    Depression    Diabetes mellitus    Fibromyalgia    Goiter    Hypertension    MI (myocardial infarction) (HCC)    Osteoarthritis    Pneumonia    10+ years ago.   Past Surgical History:  Procedure Laterality Date   ABDOMINAL HYSTERECTOMY     BREAST SURGERY     BUBBLE STUDY  12/14/2021   Procedure: BUBBLE STUDY;  Surgeon: Christell Constant, MD;  Location: [facility];  Service: Cardiovascular;;   CARDIAC CATHETERIZATION     1995   RIGHT/LEFT HEART CATH AND CORONARY ANGIOGRAPHY N/A 07/21/2021   Procedure: RIGHT/LEFT HEART CATH AND CORONARY ANGIOGRAPHY;  Surgeon: Runell Gess, MD;  Location: [facility];  Service: Cardiovascular;  Laterality: N/A;   TEE WITHOUT CARDIOVERSION N/A 12/14/2021   Procedure: TRANSESOPHAGEAL ECHOCARDIOGRAM (TEE);  Surgeon: Christell Constant, MD;  Location: [facility] ENDOSCOPY;  Service: Cardiovascular;  Laterality: N/A;   Patient Active Problem List   Diagnosis Date Noted   Severe mitral regurgitation 12/14/2021   Acute respiratory failure with hypoxia (HCC) 07/20/2021   Elevated troponin 07/20/2021   Acute CHF (congestive heart failure) (HCC) 07/20/2021   Pneumonia    Osteoarthritis    MI (myocardial infarction) (HCC)    Hypertension    Goiter    Fibromyalgia     Depression    Coronary artery disease    Arthritis of carpometacarpal [facility]) joint of right thumb 07/15/2018   Pain of right hand 07/15/2018   ARF (acute renal failure) (HCC) 08/20/2014   Chest pain 05/06/2013   Nausea vomiting and diarrhea 05/06/2013   Fever 05/06/2013   HTN (hypertension) 05/06/2013   Hypothyroidism 05/06/2013   Diabetes mellitus (HCC) 05/06/2013    REFERRING DIAG: M17.11 (ICD-10-CM) - Unilateral primary osteoarthritis, right knee M17.12 (ICD-10-CM) - Unilateral primary osteoarthritis, left knee    THERAPY DIAG:  Chronic pain of left knee  Chronic pain of right knee  Muscle weakness (generalized)  Other abnormalities of gait and mobility  Rationale for Evaluation and Treatment Rehabilitation  PERTINENT HISTORY: DM II, HTN, MI   PRECAUTIONS: cardiac  SUBJECTIVE:  SUBJECTIVE STATEMENT:  ***   PAIN:  Are you having pain? Yes: NPRS scale: 8/10 Pain location: B knees Pain description: ache Aggravating factors: weight bearing tasks Relieving factors: rest and meds   OBJECTIVE: (objective measures completed at initial evaluation unless otherwise dated)   DIAGNOSTIC FINDINGS:             See imaging   PATIENT SURVEYS:  FOTO: 17% function; 48% predicted   COGNITION: Overall cognitive status: Within functional limits for tasks assessed                         SENSATION: WFL   POSTURE: rounded shoulders, forward head, and knee varus   PALPATION: TTP to medial knee   LOWER EXTREMITY ROM:   Active ROM Right eval Left eval R 03/15/22 L 03/15/22  Hip flexion        Hip extension        Hip abduction        Hip adduction        Hip internal rotation        Hip external rotation        Knee flexion 101 84 112d 102d  Knee extension 8 12 -4d -4  Ankle  dorsiflexion        Ankle plantarflexion        Ankle inversion        Ankle eversion         (Blank rows = not tested)   LOWER EXTREMITY MMT:   MMT Right eval Left eval  Hip flexion      Hip extension      Hip abduction 3+/5 3+/5  Hip adduction      Hip internal rotation      Hip external rotation      Knee flexion 4/5 4/5  Knee extension 4/5 4/5  Ankle dorsiflexion      Ankle plantarflexion      Ankle inversion      Ankle eversion       (Blank rows = not tested)   LOWER EXTREMITY SPECIAL TESTS:  DNT   FUNCTIONAL TESTS:  5 times Sit to Stand: 26 seconds   GAIT: Distance walked: 31ft Assistive device utilized: None Level of assistance: SBA Comments: antalgic gait on L, slowed gait speed     TREATMENT:  TREATMENT 03/22/22:  Aquatic therapy at [facility]- [address] - therapeutic pool temp 92 degrees Pt enters building independently.  Treatment took place in water 3.8 to  4 ft 8 in.feet deep depending upon activity.  Pt entered and exited the pool via stair and handrails    Aquatic Therapy:  Water walking for warm up fwd/lat/bkwds  Standing: Standing march Hamstring curl x20 BIL Hip abd/add x20 BIL Hip Circles CC/CCW 2x10 each BIL Heel raises - x20 Hip ext/flex with knee straight x 20 BIL Yellow noodle stomp x20 ea Squats x20 Lunge fwd x20 Lunge lateral x20 Step ups on submerged step 2x10 BIL Step up and overs on submerged step 2x10 BIL  Core/shoulders: Kickboard push down Yellow noodle push down Riding noodle across pool using arms for propulsion reverse Water bell push pull bil alternating in squat Water bell push pull alternating in squat Water bell alternating flexion/ext in squat Water bell bil flexion/ext in squat  Gait/LE: Lateral walking with dumbell add/abd Walking march With PF Walking lunge Walking lateral lunge Cross over walking  Balance: *** stance w/ bil shoulder flexion/ext *** stance w/  alternating shoulder  flexion/ext *** stance w/ bil shoulder flexion/ext w/ waterbell resistance *** stance w/ alternating shoulder flexion/ext w/ waterbell resistance  Sitting on bench in water: Bicycle kicks x1' Reverse bicycle kicks x1' Flutter kicks x1' Scissor kicks x1'  Stretching: Runners stretch on bottom step x30" BIL Hamstring stretch on bottom step x30" BIL Figure 4 squat stretch, BIL UE support 2x30" BIL  Pt requires the buoyancy of water for active assisted exercises with buoyancy supported for strengthening and AROM exercises. Hydrostatic pressure also supports joints by unweighting joint load by at least 50 % in 3-4 feet depth water. 80% in chest to neck deep water. Water will provide assistance with movement using the current and laminar flow while the buoyancy reduces weight bearing. Pt requires the viscosity of the water for resistance with strengthening exercises.   [facility] Adult PT Treatment:                                                DATE: 03/15/22 Therapeutic Exercise: Nustep L2 6 min FAQs with adduction 15x2 B QS's 3s hold 15x Bridge with ball 15x SLR 15x B Heel slides on slide board 15x B  Supine hip fallouts RTB 15x   [facility] Adult PT Treatment:                                                DATE: 02/13/2022 Therapeutic Exercise: Supine quad set x 5 - 5" hold Supine SLR x 5 each  Supine heel slide x 5 each Seated clamshell x 10 GTB LAQ x 5 each   PATIENT EDUCATION:  Education details: eval findings, FOTO, HEP, POC Person educated: Patient and son Education method: Explanation, Demonstration, and Handouts Education comprehension: verbalized understanding and returned demonstration   HOME EXERCISE PROGRAM: Access Code: [email or web address] URL: [email or web address] Date: 02/13/2022 Prepared by: Edwinna Areola   Exercises - Supine Quadricep Sets  - 1-2 x daily - 7 x weekly - 2 sets - 10 reps - 5 sec hold - Active Straight Leg Raise with Quad Set  - 1-2 x daily - 7 x  weekly - 2 sets - 10 reps - Supine Heel Slide  - 1-2 x daily - 7 x weekly - 2 sets - 10 reps - 5 sec hold - Seated Hip Abduction with Resistance  - 1-2 x daily - 7 x weekly - 3 sets - 10 reps - green theraband hold - Seated Long Arc Quad  - 1-2 x daily - 7 x weekly - 2-3 sets - 10 reps   ASSESSMENT:   CLINICAL IMPRESSION: Session today focused on *** in the aquatic environment for use of buoyancy to offload joints and the viscosity of water as resistance during therapeutic exercise.  ***.  Patient was able to tolerate all prescribed exercises in the aquatic environment with no adverse effects and reports ***/10 pain at the end of the session. Patient continues to benefit from skilled PT services on land and aquatic based and should be progressed as able to improve functional independence.     OBJECTIVE IMPAIRMENTS: Abnormal gait, decreased activity tolerance, decreased balance, decreased endurance, decreased mobility, difficulty walking, decreased ROM, decreased strength, and pain   ACTIVITY LIMITATIONS: carrying, lifting, standing, squatting, and  transfers   PARTICIPATION LIMITATIONS: cleaning, laundry, driving, shopping, community activity, occupation, and yard work   PERSONAL FACTORS: Fitness, Time since onset of injury/illness/exacerbation, and 3+ comorbidities: DM II, HTN, MI  are also affecting patient's functional outcome.    REHAB POTENTIAL: Good   CLINICAL DECISION MAKING: Evolving/moderate complexity   EVALUATION COMPLEXITY: Moderate     GOALS: Goals reviewed with patient? No   SHORT TERM GOALS: Target date: 03/06/2022   Pt will be compliant and knowledgeable with initial HEP for improved comfort and carryover Baseline: initial HEP given  Goal status: Ongoing Pt reports occasional adherence 03/21/22   2.  Pt will self report bilateral knee pain no greater than 6/10 for improved comfort and functional ability Baseline: 10/10 at worst Goal status: Ongoing   LONG TERM  GOALS: Target date: 04/10/2022   Pt will improve FOTO function score to no less than 48% as proxy for functional improvement Baseline: 47% function Goal status: INITIAL    2.  Pt will self report bilateral knee pain no greater than 3/10 for improved comfort and functional ability Baseline: 10/10 at worst Goal status: INITIAL    3.  Pt will decrease Five Time Sit to Stand to no greater than 20 seconds in order to improve comfort and functional mobility Baseline: 26 seconds  Goal status: INITIAL    4.  Pt will improve bilateral knee ROM to at least 5-110 for improved comfort and functional mobility Baseline: see chart Goal status: INITIAL   PLAN:   PT FREQUENCY: 2x/week   PT DURATION: 8 weeks   PLANNED INTERVENTIONS: Therapeutic exercises, Therapeutic activity, Neuromuscular re-education, Balance training, Gait training, Patient/Family education, Self Care, Joint mobilization, Aquatic Therapy, Dry Needling, Electrical stimulation, Cryotherapy, Moist heat, Vasopneumatic device, Manual therapy, and Re-evaluation   PLAN FOR NEXT SESSION: assess HEP response, progress quad strength and knee ROM, functional mobility      Berta Minor, PTA 03/21/2022, 4:56 PM

## 2022-03-26 ENCOUNTER — Ambulatory Visit: Payer: 59

## 2022-03-26 DIAGNOSIS — M6281 Muscle weakness (generalized): Secondary | ICD-10-CM

## 2022-03-26 DIAGNOSIS — G8929 Other chronic pain: Secondary | ICD-10-CM

## 2022-03-26 DIAGNOSIS — M25562 Pain in left knee: Secondary | ICD-10-CM | POA: Diagnosis not present

## 2022-03-26 NOTE — Therapy (Signed)
OUTPATIENT PHYSICAL THERAPY TREATMENT NOTE   Patient Name: Stacey Fletcher MRN: [id number] DOB:06/13/1950, 72 y.o., female Today's Date: 03/26/2022  PCP: Quitman Livings, MD  REFERRING PROVIDER: Nadara Mustard, MD   END OF SESSION:   PT End of Session - 03/26/22 1701     Visit Number 4    Number of Visits 17    Date for PT Re-Evaluation 04/10/22    Authorization Type [employer] Medicare    PT Start Time 1700    PT Stop Time 1740    PT Time Calculation (min) 40 min    Activity Tolerance Patient tolerated treatment well    Behavior During Therapy WFL for tasks assessed/performed              Past Medical History:  Diagnosis Date   Coronary artery disease    Depression    Diabetes mellitus    Fibromyalgia    Goiter    Hypertension    MI (myocardial infarction) (HCC)    Osteoarthritis    Pneumonia    10+ years ago.   Past Surgical History:  Procedure Laterality Date   ABDOMINAL HYSTERECTOMY     BREAST SURGERY     BUBBLE STUDY  12/14/2021   Procedure: BUBBLE STUDY;  Surgeon: Christell Constant, MD;  Location: [facility];  Service: Cardiovascular;;   CARDIAC CATHETERIZATION     1995   RIGHT/LEFT HEART CATH AND CORONARY ANGIOGRAPHY N/A 07/21/2021   Procedure: RIGHT/LEFT HEART CATH AND CORONARY ANGIOGRAPHY;  Surgeon: Runell Gess, MD;  Location: [facility];  Service: Cardiovascular;  Laterality: N/A;   TEE WITHOUT CARDIOVERSION N/A 12/14/2021   Procedure: TRANSESOPHAGEAL ECHOCARDIOGRAM (TEE);  Surgeon: Christell Constant, MD;  Location: [facility] ENDOSCOPY;  Service: Cardiovascular;  Laterality: N/A;   Patient Active Problem List   Diagnosis Date Noted   Severe mitral regurgitation 12/14/2021   Acute respiratory failure with hypoxia (HCC) 07/20/2021   Elevated troponin 07/20/2021   Acute CHF (congestive heart failure) (HCC) 07/20/2021   Pneumonia    Osteoarthritis    MI (myocardial infarction) (HCC)    Hypertension    Goiter    Fibromyalgia     Depression    Coronary artery disease    Arthritis of carpometacarpal [facility]) joint of right thumb 07/15/2018   Pain of right hand 07/15/2018   ARF (acute renal failure) (HCC) 08/20/2014   Chest pain 05/06/2013   Nausea vomiting and diarrhea 05/06/2013   Fever 05/06/2013   HTN (hypertension) 05/06/2013   Hypothyroidism 05/06/2013   Diabetes mellitus (HCC) 05/06/2013    REFERRING DIAG: M17.11 (ICD-10-CM) - Unilateral primary osteoarthritis, right knee M17.12 (ICD-10-CM) - Unilateral primary osteoarthritis, left knee    THERAPY DIAG:  Chronic pain of left knee  Chronic pain of right knee  Muscle weakness (generalized)  Rationale for Evaluation and Treatment Rehabilitation  PERTINENT HISTORY: DM II, HTN, MI   PRECAUTIONS: cardiac  SUBJECTIVE:  SUBJECTIVE STATEMENT:  feels she is less "uncomfortable" with movement and activity.  Will see ortho tomorrow.  7/10 pain at worst.    PAIN:  Are you having pain? Yes: NPRS scale: 8/10 Pain location: B knees Pain description: ache Aggravating factors: weight bearing tasks Relieving factors: rest and meds   OBJECTIVE: (objective measures completed at initial evaluation unless otherwise dated)   DIAGNOSTIC FINDINGS:             See imaging   PATIENT SURVEYS:  FOTO: 17% function; 48% predicted   COGNITION: Overall cognitive status: Within functional limits for tasks assessed                         SENSATION: WFL   POSTURE: rounded shoulders, forward head, and knee varus   PALPATION: TTP to medial knee   LOWER EXTREMITY ROM:   Active ROM Right eval Left eval R 03/15/22 L 03/15/22  Hip flexion        Hip extension        Hip abduction        Hip adduction        Hip internal rotation        Hip external rotation        Knee flexion 101  84 112d 102d  Knee extension 8 12 -4d -4  Ankle dorsiflexion        Ankle plantarflexion        Ankle inversion        Ankle eversion         (Blank rows = not tested)   LOWER EXTREMITY MMT:   MMT Right eval Left eval  Hip flexion      Hip extension      Hip abduction 3+/5 3+/5  Hip adduction      Hip internal rotation      Hip external rotation      Knee flexion 4/5 4/5  Knee extension 4/5 4/5  Ankle dorsiflexion      Ankle plantarflexion      Ankle inversion      Ankle eversion       (Blank rows = not tested)   LOWER EXTREMITY SPECIAL TESTS:  DNT   FUNCTIONAL TESTS:  5 times Sit to Stand: 26 seconds   GAIT: Distance walked: 84ft Assistive device utilized: None Level of assistance: SBA Comments: antalgic gait on L, slowed gait speed     TREATMENT: [facility] Adult PT Treatment:                                                DATE: 03/26/22 Therapeutic Exercise: Nustep L4 8 min FAQs with adduction 15x2 2# Seated hamstring curl RTB x15 BIL Seated hamstring stretch 2x30" BIL B QS's 3s hold 15x Bridge with ball 15x2 SLR 15x2 B 2# Supine hip fallouts GTB 15x B f/b 15x unilaterally SAQs 2# 15x2 PF against wall 15x B, 15x unilaterally   [facility] Adult PT Treatment:                                                DATE: 03/21/22 Therapeutic Exercise: Nustep L5 6 min FAQs with adduction 15x2 Seated hamstring curl RTB  x15 BIL Seated hamstring stretch 2x30" BIL B QS's 3s hold 15x Supine hip adduction ball squeeze 5" hold x15 Bridge with ball 15x SLR 15x B Supine hip fallouts RTB 15x  [facility] Adult PT Treatment:                                                DATE: 03/15/22 Therapeutic Exercise: Nustep L2 6 min FAQs with adduction 15x2 B QS's 3s hold 15x Bridge with ball 15x SLR 15x B Heel slides on slide board 15x B  Supine hip fallouts RTB 15x   [facility] Adult PT Treatment:                                                DATE: 02/13/2022 Therapeutic Exercise: Supine quad  set x 5 - 5" hold Supine SLR x 5 each  Supine heel slide x 5 each Seated clamshell x 10 GTB LAQ x 5 each   PATIENT EDUCATION:  Education details: eval findings, FOTO, HEP, POC Person educated: Patient and son Education method: Explanation, Demonstration, and Handouts Education comprehension: verbalized understanding and returned demonstration   HOME EXERCISE PROGRAM: Access Code: [email or web address] URL: [email or web address] Date: 02/13/2022 Prepared by: Edwinna Areola   Exercises - Supine Quadricep Sets  - 1-2 x daily - 7 x weekly - 2 sets - 10 reps - 5 sec hold - Active Straight Leg Raise with Quad Set  - 1-2 x daily - 7 x weekly - 2 sets - 10 reps - Supine Heel Slide  - 1-2 x daily - 7 x weekly - 2 sets - 10 reps - 5 sec hold - Seated Hip Abduction with Resistance  - 1-2 x daily - 7 x weekly - 3 sets - 10 reps - green theraband hold - Seated Long Arc Quad  - 1-2 x daily - 7 x weekly - 2-3 sets - 10 reps   ASSESSMENT:   CLINICAL IMPRESSION: Today's session continued to address knee extension strength and focus on regaining quad strength while emphasizing TKE.  Added weight, reps and challenge as noted.  Continued need of cuing to perform exercises properly and remain on task.  Introduced ankle strengthening to unload knee joints and promote flexibility    OBJECTIVE IMPAIRMENTS: Abnormal gait, decreased activity tolerance, decreased balance, decreased endurance, decreased mobility, difficulty walking, decreased ROM, decreased strength, and pain   ACTIVITY LIMITATIONS: carrying, lifting, standing, squatting, and transfers   PARTICIPATION LIMITATIONS: cleaning, laundry, driving, shopping, community activity, occupation, and yard work   PERSONAL FACTORS: Fitness, Time since onset of injury/illness/exacerbation, and 3+ comorbidities: DM II, HTN, MI  are also affecting patient's functional outcome.    REHAB POTENTIAL: Good   CLINICAL DECISION MAKING: Evolving/moderate  complexity   EVALUATION COMPLEXITY: Moderate     GOALS: Goals reviewed with patient? No   SHORT TERM GOALS: Target date: 03/06/2022   Pt will be compliant and knowledgeable with initial HEP for improved comfort and carryover Baseline: initial HEP given  Goal status: Ongoing Pt reports occasional adherence 03/21/22   2.  Pt will self report bilateral knee pain no greater than 6/10 for improved comfort and functional ability Baseline: 10/10 at worst Goal status: Ongoing   LONG TERM GOALS: Target date:  04/10/2022   Pt will improve FOTO function score to no less than 48% as proxy for functional improvement Baseline: 47% function Goal status: INITIAL    2.  Pt will self report bilateral knee pain no greater than 3/10 for improved comfort and functional ability Baseline: 10/10 at worst Goal status: INITIAL    3.  Pt will decrease Five Time Sit to Stand to no greater than 20 seconds in order to improve comfort and functional mobility Baseline: 26 seconds  Goal status: INITIAL    4.  Pt will improve bilateral knee ROM to at least 5-110 for improved comfort and functional mobility Baseline: see chart Goal status: INITIAL   PLAN:   PT FREQUENCY: 2x/week   PT DURATION: 8 weeks   PLANNED INTERVENTIONS: Therapeutic exercises, Therapeutic activity, Neuromuscular re-education, Balance training, Gait training, Patient/Family education, Self Care, Joint mobilization, Aquatic Therapy, Dry Needling, Electrical stimulation, Cryotherapy, Moist heat, Vasopneumatic device, Manual therapy, and Re-evaluation   PLAN FOR NEXT SESSION: assess HEP response, progress quad strength and knee ROM, functional mobility      Hildred Laser, PT 03/26/2022, 5:46 PM

## 2022-03-28 ENCOUNTER — Ambulatory Visit: Payer: 59

## 2022-03-28 ENCOUNTER — Encounter (HOSPITAL_COMMUNITY): Payer: Medicaid Other

## 2022-03-28 DIAGNOSIS — M25562 Pain in left knee: Secondary | ICD-10-CM | POA: Diagnosis not present

## 2022-03-28 DIAGNOSIS — G8929 Other chronic pain: Secondary | ICD-10-CM

## 2022-03-28 DIAGNOSIS — R2689 Other abnormalities of gait and mobility: Secondary | ICD-10-CM

## 2022-03-28 DIAGNOSIS — M6281 Muscle weakness (generalized): Secondary | ICD-10-CM

## 2022-03-28 NOTE — Therapy (Signed)
OUTPATIENT PHYSICAL THERAPY TREATMENT NOTE   Patient Name: Stacey Fletcher MRN: [id number] DOB:03/25/1950, 72 y.o., female Today's Date: 03/28/2022  PCP: Quitman Livings, MD  REFERRING PROVIDER: Nadara Mustard, MD   END OF SESSION:   PT End of Session - 03/28/22 1756     Visit Number 5    Number of Visits 17    Date for PT Re-Evaluation 04/10/22    Authorization Type [employer] Medicare    PT Start Time 1755   Pt arrived 10 mins late   PT Stop Time 1833    PT Time Calculation (min) 38 min    Activity Tolerance Patient tolerated treatment well    Behavior During Therapy WFL for tasks assessed/performed               Past Medical History:  Diagnosis Date   Coronary artery disease    Depression    Diabetes mellitus    Fibromyalgia    Goiter    Hypertension    MI (myocardial infarction) (HCC)    Osteoarthritis    Pneumonia    10+ years ago.   Past Surgical History:  Procedure Laterality Date   ABDOMINAL HYSTERECTOMY     BREAST SURGERY     BUBBLE STUDY  12/14/2021   Procedure: BUBBLE STUDY;  Surgeon: Christell Constant, MD;  Location: [facility] ENDOS[facility];  Service: Cardiovascular;;   CARDIAC CATHETERIZATION     1995   RIGHT/LEFT HEART CATH AND CORONARY ANGIOGRAPHY N/A 07/21/2021   Procedure: RIGHT/LEFT HEART CATH AND CORONARY ANGIOGRAPHY;  Surgeon: Runell Gess, MD;  Location: [facility];  Service: Cardiovascular;  Laterality: N/A;   TEE WITHOUT CARDIOVERSION N/A 12/14/2021   Procedure: TRANSESOPHAGEAL ECHOCARDIOGRAM (TEE);  Surgeon: Christell Constant, MD;  Location: [facility] ENDOSCOPY;  Service: Cardiovascular;  Laterality: N/A;   Patient Active Problem List   Diagnosis Date Noted   Severe mitral regurgitation 12/14/2021   Acute respiratory failure with hypoxia (HCC) 07/20/2021   Elevated troponin 07/20/2021   Acute CHF (congestive heart failure) (HCC) 07/20/2021   Pneumonia    Osteoarthritis    MI (myocardial infarction) (HCC)    Hypertension    Goiter     Fibromyalgia    Depression    Coronary artery disease    Arthritis of carpometacarpal [facility]) joint of right thumb 07/15/2018   Pain of right hand 07/15/2018   ARF (acute renal failure) (HCC) 08/20/2014   Chest pain 05/06/2013   Nausea vomiting and diarrhea 05/06/2013   Fever 05/06/2013   HTN (hypertension) 05/06/2013   Hypothyroidism 05/06/2013   Diabetes mellitus (HCC) 05/06/2013    REFERRING DIAG: M17.11 (ICD-10-CM) - Unilateral primary osteoarthritis, right knee M17.12 (ICD-10-CM) - Unilateral primary osteoarthritis, left knee    THERAPY DIAG:  Chronic pain of left knee  Chronic pain of right knee  Muscle weakness (generalized)  Other abnormalities of gait and mobility  Rationale for Evaluation and Treatment Rehabilitation  PERTINENT HISTORY: DM II, HTN, MI   PRECAUTIONS: cardiac  SUBJECTIVE:  SUBJECTIVE STATEMENT:  Patient reports that her pain is improving with PT and states she sees her ortho doc next week.     PAIN:  Are you having pain? Yes: NPRS scale: 4/10 Pain location: B knees Pain description: ache Aggravating factors: weight bearing tasks Relieving factors: rest and meds   OBJECTIVE: (objective measures completed at initial evaluation unless otherwise dated)   DIAGNOSTIC FINDINGS:             See imaging   PATIENT SURVEYS:  FOTO: 17% function; 48% predicted   COGNITION: Overall cognitive status: Within functional limits for tasks assessed                         SENSATION: WFL   POSTURE: rounded shoulders, forward head, and knee varus   PALPATION: TTP to medial knee   LOWER EXTREMITY ROM:   Active ROM Right eval Left eval R 03/15/22 L 03/15/22  Hip flexion        Hip extension        Hip abduction        Hip adduction        Hip internal rotation         Hip external rotation        Knee flexion 101 84 112d 102d  Knee extension 8 12 -4d -4  Ankle dorsiflexion        Ankle plantarflexion        Ankle inversion        Ankle eversion         (Blank rows = not tested)   LOWER EXTREMITY MMT:   MMT Right eval Left eval  Hip flexion      Hip extension      Hip abduction 3+/5 3+/5  Hip adduction      Hip internal rotation      Hip external rotation      Knee flexion 4/5 4/5  Knee extension 4/5 4/5  Ankle dorsiflexion      Ankle plantarflexion      Ankle inversion      Ankle eversion       (Blank rows = not tested)   LOWER EXTREMITY SPECIAL TESTS:  DNT   FUNCTIONAL TESTS:  5 times Sit to Stand: 26 seconds   GAIT: Distance walked: 26ft Assistive device utilized: None Level of assistance: SBA Comments: antalgic gait on L, slowed gait speed     TREATMENT: [facility] Adult PT Treatment:                                                DATE: 03/28/22 Therapeutic Exercise: Nustep L5 5 min FAQs with adduction 15x2 2# Seated hamstring curl RTB x15 BIL B QS's 3s hold 15x Bridge with ball 15x2 SLR 15x2 B 2# SAQs 2# 15x2 Supine hip adduction ball squeeze 3" hold x15   [facility] Adult PT Treatment:                                                DATE: 03/26/22 Therapeutic Exercise: Nustep L4 8 min FAQs with adduction 15x2 2# Seated hamstring curl RTB x15 BIL Seated hamstring stretch 2x30" BIL B QS's 3s hold 15x  Bridge with ball 15x2 SLR 15x2 B 2# Supine hip fallouts GTB 15x B f/b 15x unilaterally SAQs 2# 15x2 PF against wall 15x B, 15x unilaterally   [facility] Adult PT Treatment:                                                DATE: 03/21/22 Therapeutic Exercise: Nustep L5 6 min FAQs with adduction 15x2 Seated hamstring curl RTB x15 BIL Seated hamstring stretch 2x30" BIL B QS's 3s hold 15x Supine hip adduction ball squeeze 5" hold x15 Bridge with ball 15x SLR 15x B Supine hip fallouts RTB 15x    PATIENT EDUCATION:   Education details: eval findings, FOTO, HEP, POC Person educated: Patient and son Education method: Explanation, Demonstration, and Handouts Education comprehension: verbalized understanding and returned demonstration   HOME EXERCISE PROGRAM: Access Code: [email or web address] URL: [email or web address] Date: 02/13/2022 Prepared by: Edwinna Areola   Exercises - Supine Quadricep Sets  - 1-2 x daily - 7 x weekly - 2 sets - 10 reps - 5 sec hold - Active Straight Leg Raise with Quad Set  - 1-2 x daily - 7 x weekly - 2 sets - 10 reps - Supine Heel Slide  - 1-2 x daily - 7 x weekly - 2 sets - 10 reps - 5 sec hold - Seated Hip Abduction with Resistance  - 1-2 x daily - 7 x weekly - 3 sets - 10 reps - green theraband hold - Seated Long Arc Quad  - 1-2 x daily - 7 x weekly - 2-3 sets - 10 reps   ASSESSMENT:   CLINICAL IMPRESSION:  Patient presents to PT 10 minutes late, truncating session, reporting improved pain in her knees and states that she sees her ortho MD next week. Session today continued to focus on BIL LE strengthening. Patient was able to tolerate all prescribed exercises with no adverse effects. Patient continues to benefit from skilled PT services and should be progressed as able to improve functional independence.     OBJECTIVE IMPAIRMENTS: Abnormal gait, decreased activity tolerance, decreased balance, decreased endurance, decreased mobility, difficulty walking, decreased ROM, decreased strength, and pain   ACTIVITY LIMITATIONS: carrying, lifting, standing, squatting, and transfers   PARTICIPATION LIMITATIONS: cleaning, laundry, driving, shopping, community activity, occupation, and yard work   PERSONAL FACTORS: Fitness, Time since onset of injury/illness/exacerbation, and 3+ comorbidities: DM II, HTN, MI  are also affecting patient's functional outcome.    REHAB POTENTIAL: Good   CLINICAL DECISION MAKING: Evolving/moderate complexity   EVALUATION COMPLEXITY: Moderate      GOALS: Goals reviewed with patient? No   SHORT TERM GOALS: Target date: 03/06/2022   Pt will be compliant and knowledgeable with initial HEP for improved comfort and carryover Baseline: initial HEP given  Goal status: Ongoing Pt reports occasional adherence 03/21/22   2.  Pt will self report bilateral knee pain no greater than 6/10 for improved comfort and functional ability Baseline: 10/10 at worst Goal status: Ongoing   LONG TERM GOALS: Target date: 04/10/2022   Pt will improve FOTO function score to no less than 48% as proxy for functional improvement Baseline: 47% function Goal status: INITIAL    2.  Pt will self report bilateral knee pain no greater than 3/10 for improved comfort and functional ability Baseline: 10/10 at worst Goal status: INITIAL    3.  Pt will  decrease Five Time Sit to Stand to no greater than 20 seconds in order to improve comfort and functional mobility Baseline: 26 seconds  Goal status: INITIAL    4.  Pt will improve bilateral knee ROM to at least 5-110 for improved comfort and functional mobility Baseline: see chart Goal status: INITIAL   PLAN:   PT FREQUENCY: 2x/week   PT DURATION: 8 weeks   PLANNED INTERVENTIONS: Therapeutic exercises, Therapeutic activity, Neuromuscular re-education, Balance training, Gait training, Patient/Family education, Self Care, Joint mobilization, Aquatic Therapy, Dry Needling, Electrical stimulation, Cryotherapy, Moist heat, Vasopneumatic device, Manual therapy, and Re-evaluation   PLAN FOR NEXT SESSION: assess HEP response, progress quad strength and knee ROM, functional mobility      Berta Minor, PTA 03/28/2022, 6:33 PM

## 2022-04-03 ENCOUNTER — Ambulatory Visit: Payer: 59

## 2022-04-03 DIAGNOSIS — G8929 Other chronic pain: Secondary | ICD-10-CM

## 2022-04-03 DIAGNOSIS — M6281 Muscle weakness (generalized): Secondary | ICD-10-CM

## 2022-04-03 DIAGNOSIS — M25562 Pain in left knee: Secondary | ICD-10-CM | POA: Diagnosis not present

## 2022-04-03 NOTE — Therapy (Signed)
OUTPATIENT PHYSICAL THERAPY TREATMENT NOTE   Patient Name: Stacey Fletcher MRN: [id number] DOB:01/23/1951, 72 y.o., female Today's Date: 04/03/2022  PCP: Quitman Livings, MD  REFERRING PROVIDER: Nadara Mustard, MD   END OF SESSION:   PT End of Session - 04/03/22 1703     Visit Number 6    Number of Visits 17    Date for PT Re-Evaluation 04/10/22    Authorization Type [employer] Medicare    PT Start Time 1700    PT Stop Time 1740    PT Time Calculation (min) 40 min    Activity Tolerance Patient tolerated treatment well    Behavior During Therapy WFL for tasks assessed/performed               Past Medical History:  Diagnosis Date   Coronary artery disease    Depression    Diabetes mellitus    Fibromyalgia    Goiter    Hypertension    MI (myocardial infarction) (HCC)    Osteoarthritis    Pneumonia    10+ years ago.   Past Surgical History:  Procedure Laterality Date   ABDOMINAL HYSTERECTOMY     BREAST SURGERY     BUBBLE STUDY  12/14/2021   Procedure: BUBBLE STUDY;  Surgeon: Christell Constant, MD;  Location: [facility]OS[facility];  Service: Cardiovascular;;   CARDIAC CATHETERIZATION     1995   RIGHT/LEFT HEART CATH AND CORONARY ANGIOGRAPHY N/A 07/21/2021   Procedure: RIGHT/LEFT HEART CATH AND CORONARY ANGIOGRAPHY;  Surgeon: Runell Gess, MD;  Location: [facility]AS[facility] CV [facility];  Service: Cardiovascular;  Laterality: N/A;   TEE WITHOUT CARDIOVERSION N/A 12/14/2021   Procedure: TRANSESOPHAGEAL ECHOCARDIOGRAM (TEE);  Surgeon: Christell Constant, MD;  Location: [facility] ENDOSCOPY;  Service: Cardiovascular;  Laterality: N/A;   Patient Active Problem List   Diagnosis Date Noted   Severe mitral regurgitation 12/14/2021   Acute respiratory failure with hypoxia (HCC) 07/20/2021   Elevated troponin 07/20/2021   Acute CHF (congestive heart failure) (HCC) 07/20/2021   Pneumonia    Osteoarthritis    MI (myocardial infarction) (HCC)    Hypertension    Goiter    Fibromyalgia     Depression    Coronary artery disease    Arthritis of carpometacarpal [facility]) joint of right thumb 07/15/2018   Pain of right hand 07/15/2018   ARF (acute renal failure) (HCC) 08/20/2014   Chest pain 05/06/2013   Nausea vomiting and diarrhea 05/06/2013   Fever 05/06/2013   HTN (hypertension) 05/06/2013   Hypothyroidism 05/06/2013   Diabetes mellitus (HCC) 05/06/2013    REFERRING DIAG: M17.11 (ICD-10-CM) - Unilateral primary osteoarthritis, right knee M17.12 (ICD-10-CM) - Unilateral primary osteoarthritis, left knee    THERAPY DIAG:  Chronic pain of left knee  Chronic pain of right knee  Muscle weakness (generalized)  Rationale for Evaluation and Treatment Rehabilitation  PERTINENT HISTORY: DM II, HTN, MI   PRECAUTIONS: cardiac  SUBJECTIVE:  SUBJECTIVE STATEMENT:  Pain and discomfort has increased in B quads and calves, nothing to explain elevated symptoms    PAIN:  Are you having pain? Yes: NPRS scale: 4/10 Pain location: B knees Pain description: ache Aggravating factors: weight bearing tasks Relieving factors: rest and meds   OBJECTIVE: (objective measures completed at initial evaluation unless otherwise dated)   DIAGNOSTIC FINDINGS:             See imaging   PATIENT SURVEYS:  FOTO: 17% function; 48% predicted   COGNITION: Overall cognitive status: Within functional limits for tasks assessed                         SENSATION: WFL   POSTURE: rounded shoulders, forward head, and knee varus   PALPATION: TTP to medial knee   LOWER EXTREMITY ROM:   Active ROM Right eval Left eval R 03/15/22 L 03/15/22  Hip flexion        Hip extension        Hip abduction        Hip adduction        Hip internal rotation        Hip external rotation        Knee flexion 101 84 112d 102d   Knee extension 8 12 -4d -4  Ankle dorsiflexion        Ankle plantarflexion        Ankle inversion        Ankle eversion         (Blank rows = not tested)   LOWER EXTREMITY MMT:   MMT Right eval Left eval  Hip flexion      Hip extension      Hip abduction 3+/5 3+/5  Hip adduction      Hip internal rotation      Hip external rotation      Knee flexion 4/5 4/5  Knee extension 4/5 4/5  Ankle dorsiflexion      Ankle plantarflexion      Ankle inversion      Ankle eversion       (Blank rows = not tested)   LOWER EXTREMITY SPECIAL TESTS:  DNT   FUNCTIONAL TESTS:  5 times Sit to Stand: 26 seconds   GAIT: Distance walked: 63ft Assistive device utilized: None Level of assistance: SBA Comments: antalgic gait on L, slowed gait speed     TREATMENT: [facility] Adult PT Treatment:                                                DATE: 04/03/22 Therapeutic Exercise: Nustep L4 8 min S/L abduction 3# 15x Bil FAQs with adduction 15x 3# Seated hamstring curl RTB x15 BIL STS from high table 10x arms crossed SLR 15x Bil 3# SAQs 3# 15x Bil  [facility] Adult PT Treatment:                                                DATE: 03/28/22 Therapeutic Exercise: Nustep L5 5 min FAQs with adduction 15x2 2# Seated hamstring curl RTB x15 BIL B QS's 3s hold 15x Bridge with ball 15x2 SLR 15x2 B 2# SAQs 2# 15x2 Supine hip adduction  ball squeeze 3" hold x15   [facility] Adult PT Treatment:                                                DATE: 03/26/22 Therapeutic Exercise: Nustep L4 8 min FAQs with adduction 15x2 2# Seated hamstring curl RTB x15 BIL Seated hamstring stretch 2x30" BIL B QS's 3s hold 15x Bridge with ball 15x2 SLR 15x2 B 2# Supine hip fallouts GTB 15x B f/b 15x unilaterally SAQs 2# 15x2 PF against wall 15x B, 15x unilaterally   [facility] Adult PT Treatment:                                                DATE: 03/21/22 Therapeutic Exercise: Nustep L5 6 min FAQs with adduction 15x2 Seated  hamstring curl RTB x15 BIL Seated hamstring stretch 2x30" BIL B QS's 3s hold 15x Supine hip adduction ball squeeze 5" hold x15 Bridge with ball 15x SLR 15x B Supine hip fallouts RTB 15x    PATIENT EDUCATION:  Education details: eval findings, FOTO, HEP, POC Person educated: Patient and son Education method: Explanation, Demonstration, and Handouts Education comprehension: verbalized understanding and returned demonstration   HOME EXERCISE PROGRAM: Access Code: [email or web address] URL: [email or web address] Date: 04/03/2022 Prepared by: Gustavus Bryant  Exercises - Active Straight Leg Raise with Quad Set  - 1-2 x daily - 5 x weekly - 2 sets - 15 reps - Supine Heel Slide  - 1-2 x daily - 5 x weekly - 2 sets - 15 reps - 5 sec hold - Seated Long Arc Quad  - 1-2 x daily - 5 x weekly - 2 sets - 15 reps - Seated Hamstring Curl with Anchored Resistance  - 2 x daily - 5 x weekly - 2 sets - 15 reps - Sit to Stand with Arms Crossed  - 2 x daily - 5 x weekly - 2 sets - 10 reps   ASSESSMENT:   CLINICAL IMPRESSION: Today's session focused on regaining B quad and hip strength.  HEP reviewed and updated adding sit to stand tasks, hamstring strengthening and functional training.    OBJECTIVE IMPAIRMENTS: Abnormal gait, decreased activity tolerance, decreased balance, decreased endurance, decreased mobility, difficulty walking, decreased ROM, decreased strength, and pain   ACTIVITY LIMITATIONS: carrying, lifting, standing, squatting, and transfers   PARTICIPATION LIMITATIONS: cleaning, laundry, driving, shopping, community activity, occupation, and yard work   PERSONAL FACTORS: Fitness, Time since onset of injury/illness/exacerbation, and 3+ comorbidities: DM II, HTN, MI  are also affecting patient's functional outcome.    REHAB POTENTIAL: Good   CLINICAL DECISION MAKING: Evolving/moderate complexity   EVALUATION COMPLEXITY: Moderate     GOALS: Goals reviewed with patient? No    SHORT TERM GOALS: Target date: 03/06/2022   Pt will be compliant and knowledgeable with initial HEP for improved comfort and carryover Baseline: initial HEP given  Goal status: Met Pt reports occasional adherence 03/21/22   2.  Pt will self report bilateral knee pain no greater than 6/10 for improved comfort and functional ability Baseline: 10/10 at worst;6/10 worst, 4/10 average Goal status: Met   LONG TERM GOALS: Target date: 04/10/2022   Pt will improve FOTO function score to no less than 48% as proxy for  functional improvement Baseline: 47% function; 04/03/22 49% Goal status: Met   2.  Pt will self report bilateral knee pain no greater than 3/10 for improved comfort and functional ability Baseline: 10/10 at worst Goal status: INITIAL    3.  Pt will decrease Five Time Sit to Stand to no greater than 20 seconds in order to improve comfort and functional mobility Baseline: 26 seconds  Goal status: INITIAL    4.  Pt will improve bilateral knee ROM to at least 5-110 for improved comfort and functional mobility Baseline: see chart Goal status: INITIAL   PLAN:   PT FREQUENCY: 2x/week   PT DURATION: 8 weeks   PLANNED INTERVENTIONS: Therapeutic exercises, Therapeutic activity, Neuromuscular re-education, Balance training, Gait training, Patient/Family education, Self Care, Joint mobilization, Aquatic Therapy, Dry Needling, Electrical stimulation, Cryotherapy, Moist heat, Vasopneumatic device, Manual therapy, and Re-evaluation   PLAN FOR NEXT SESSION: assess HEP response, progress quad strength and knee ROM, functional mobility      Hildred Laser, PT 04/03/2022, 5:49 PM

## 2022-04-05 ENCOUNTER — Ambulatory Visit (HOSPITAL_COMMUNITY): Payer: 59 | Attending: Cardiology

## 2022-04-05 ENCOUNTER — Other Ambulatory Visit (HOSPITAL_COMMUNITY): Payer: Self-pay

## 2022-04-05 ENCOUNTER — Ambulatory Visit: Payer: 59

## 2022-04-05 ENCOUNTER — Other Ambulatory Visit (HOSPITAL_COMMUNITY): Payer: Self-pay | Admitting: *Deleted

## 2022-04-05 DIAGNOSIS — I5020 Unspecified systolic (congestive) heart failure: Secondary | ICD-10-CM

## 2022-04-05 DIAGNOSIS — M6281 Muscle weakness (generalized): Secondary | ICD-10-CM

## 2022-04-05 DIAGNOSIS — G8929 Other chronic pain: Secondary | ICD-10-CM

## 2022-04-05 DIAGNOSIS — M25562 Pain in left knee: Secondary | ICD-10-CM | POA: Diagnosis not present

## 2022-04-05 NOTE — Therapy (Signed)
OUTPATIENT PHYSICAL THERAPY TREATMENT NOTE   Patient Name: Stacey Fletcher MRN: [id number] DOB:01/26/1951, 72 y.o., female Today's Date: 04/05/2022  PCP: Quitman Livings, MD  REFERRING PROVIDER: Nadara Mustard, MD   END OF SESSION:   PT End of Session - 04/05/22 1703     Visit Number 7    Number of Visits 17    Date for PT Re-Evaluation 04/10/22    Authorization Type [employer] Medicare    PT Start Time 1701    PT Stop Time 1741    PT Time Calculation (min) 40 min    Activity Tolerance Patient tolerated treatment well    Behavior During Therapy WFL for tasks assessed/performed               Past Medical History:  Diagnosis Date   Coronary artery disease    Depression    Diabetes mellitus    Fibromyalgia    Goiter    Hypertension    MI (myocardial infarction) (HCC)    Osteoarthritis    Pneumonia    10+ years ago.   Past Surgical History:  Procedure Laterality Date   ABDOMINAL HYSTERECTOMY     BREAST SURGERY     BUBBLE STUDY  12/14/2021   Procedure: BUBBLE STUDY;  Surgeon: Christell Constant, MD;  Location: [facility] ENDOSCOPY;  Service: Cardiovascular;;   CARDIAC CATHETERIZATION     1995   RIGHT/LEFT HEART CATH AND CORONARY ANGIOGRAPHY N/A 07/21/2021   Procedure: RIGHT/LEFT HEART CATH AND CORONARY ANGIOGRAPHY;  Surgeon: Runell Gess, MD;  Location: [facility]VAS[facility];  Service: Cardiovascular;  Laterality: N/A;   TEE WITHOUT CARDIOVERSION N/A 12/14/2021   Procedure: TRANSESOPHAGEAL ECHOCARDIOGRAM (TEE);  Surgeon: Christell Constant, MD;  Location: [facility] ENDOSCOPY;  Service: Cardiovascular;  Laterality: N/A;   Patient Active Problem List   Diagnosis Date Noted   Severe mitral regurgitation 12/14/2021   Acute respiratory failure with hypoxia (HCC) 07/20/2021   Elevated troponin 07/20/2021   Acute CHF (congestive heart failure) (HCC) 07/20/2021   Pneumonia    Osteoarthritis    MI (myocardial infarction) (HCC)    Hypertension    Goiter    Fibromyalgia     Depression    Coronary artery disease    Arthritis of carpometacarpal [facility]) joint of right thumb 07/15/2018   Pain of right hand 07/15/2018   ARF (acute renal failure) (HCC) 08/20/2014   Chest pain 05/06/2013   Nausea vomiting and diarrhea 05/06/2013   Fever 05/06/2013   HTN (hypertension) 05/06/2013   Hypothyroidism 05/06/2013   Diabetes mellitus (HCC) 05/06/2013    REFERRING DIAG: M17.11 (ICD-10-CM) - Unilateral primary osteoarthritis, right knee M17.12 (ICD-10-CM) - Unilateral primary osteoarthritis, left knee    THERAPY DIAG:  Chronic pain of left knee  Chronic pain of right knee  Muscle weakness (generalized)  Rationale for Evaluation and Treatment Rehabilitation  PERTINENT HISTORY: DM II, HTN, MI   PRECAUTIONS: cardiac  SUBJECTIVE:  SUBJECTIVE STATEMENT:  Describes only symptoms in her L calf today following cardiac stress test and inclined treadmill walking.    PAIN:  Are you having pain? Yes: NPRS scale: 4/10 Pain location: B knees Pain description: ache Aggravating factors: weight bearing tasks Relieving factors: rest and meds   OBJECTIVE: (objective measures completed at initial evaluation unless otherwise dated)   DIAGNOSTIC FINDINGS:             See imaging   PATIENT SURVEYS:  FOTO: 17% function; 48% predicted; 04/03/22 49%   COGNITION: Overall cognitive status: Within functional limits for tasks assessed                         SENSATION: WFL   POSTURE: rounded shoulders, forward head, and knee varus   PALPATION: TTP to medial knee   LOWER EXTREMITY ROM:   Active ROM Right eval Left eval R 03/15/22 L 03/15/22  Hip flexion        Hip extension        Hip abduction        Hip adduction        Hip internal rotation        Hip external rotation        Knee  flexion 101 84 112d 102d  Knee extension 8 12 -4d -4  Ankle dorsiflexion        Ankle plantarflexion        Ankle inversion        Ankle eversion         (Blank rows = not tested)   LOWER EXTREMITY MMT:   MMT Right eval Left eval  Hip flexion      Hip extension      Hip abduction 3+/5 3+/5  Hip adduction      Hip internal rotation      Hip external rotation      Knee flexion 4/5 4/5  Knee extension 4/5 4/5  Ankle dorsiflexion      Ankle plantarflexion      Ankle inversion      Ankle eversion       (Blank rows = not tested)   LOWER EXTREMITY SPECIAL TESTS:  DNT   FUNCTIONAL TESTS:  5 times Sit to Stand: 26 seconds   GAIT: Distance walked: 16ft Assistive device utilized: None Level of assistance: SBA Comments: antalgic gait on L, slowed gait speed     TREATMENT: [facility] Adult PT Treatment:                                                DATE: 04/05/22 Therapeutic Exercise: Nustep L4 8 min S/L abduction 4# 15x Bil FAQs with adduction 15x 4# Standing ham curl 4# 15x Bil Runners step 4 in block 10/10 SLR 15x Bil 4# SAQs 4# 15x Bil PF against wall 15x   [facility] Adult PT Treatment:                                                DATE: 04/03/22 Therapeutic Exercise: Nustep L4 8 min S/L abduction 3# 15x Bil FAQs with adduction 15x 3# Seated hamstring curl RTB x15 BIL STS from high table 10x arms crossed  SLR 15x Bil 3# SAQs 3# 15x Bil  [facility] Adult PT Treatment:                                                DATE: 03/28/22 Therapeutic Exercise: Nustep L5 5 min FAQs with adduction 15x2 2# Seated hamstring curl RTB x15 BIL B QS's 3s hold 15x Bridge with ball 15x2 SLR 15x2 B 2# SAQs 2# 15x2 Supine hip adduction ball squeeze 3" hold x15   [facility] Adult PT Treatment:                                                DATE: 03/26/22 Therapeutic Exercise: Nustep L4 8 min FAQs with adduction 15x2 2# Seated hamstring curl RTB x15 BIL Seated hamstring stretch 2x30" BIL B  QS's 3s hold 15x Bridge with ball 15x2 SLR 15x2 B 2# Supine hip fallouts GTB 15x B f/b 15x unilaterally SAQs 2# 15x2 PF against wall 15x B, 15x unilaterally   [facility] Adult PT Treatment:                                                DATE: 03/21/22 Therapeutic Exercise: Nustep L5 6 min FAQs with adduction 15x2 Seated hamstring curl RTB x15 BIL Seated hamstring stretch 2x30" BIL B QS's 3s hold 15x Supine hip adduction ball squeeze 5" hold x15 Bridge with ball 15x SLR 15x B Supine hip fallouts RTB 15x    PATIENT EDUCATION:  Education details: eval findings, FOTO, HEP, POC Person educated: Patient and son Education method: Explanation, Demonstration, and Handouts Education comprehension: verbalized understanding and returned demonstration   HOME EXERCISE PROGRAM: Access Code: [email or web address] URL: [email or web address] Date: 04/03/2022 Prepared by: Gustavus Bryant  Exercises - Active Straight Leg Raise with Quad Set  - 1-2 x daily - 5 x weekly - 2 sets - 15 reps - Supine Heel Slide  - 1-2 x daily - 5 x weekly - 2 sets - 15 reps - 5 sec hold - Seated Long Arc Quad  - 1-2 x daily - 5 x weekly - 2 sets - 15 reps - Seated Hamstring Curl with Anchored Resistance  - 2 x daily - 5 x weekly - 2 sets - 15 reps - Sit to Stand with Arms Crossed  - 2 x daily - 5 x weekly - 2 sets - 10 reps   ASSESSMENT:   CLINICAL IMPRESSION: Increased weight and resistance on LE exercises.  Added stepping tasks vis runners step for strength, balance  and proprioceptive work.  No increase in calf symptoms with tasks today.      OBJECTIVE IMPAIRMENTS: Abnormal gait, decreased activity tolerance, decreased balance, decreased endurance, decreased mobility, difficulty walking, decreased ROM, decreased strength, and pain   ACTIVITY LIMITATIONS: carrying, lifting, standing, squatting, and transfers   PARTICIPATION LIMITATIONS: cleaning, laundry, driving, shopping, community activity, occupation, and  yard work   PERSONAL FACTORS: Fitness, Time since onset of injury/illness/exacerbation, and 3+ comorbidities: DM II, HTN, MI  are also affecting patient's functional outcome.    REHAB POTENTIAL: Good   CLINICAL DECISION MAKING: Evolving/moderate complexity  EVALUATION COMPLEXITY: Moderate     GOALS: Goals reviewed with patient? No   SHORT TERM GOALS: Target date: 03/06/2022   Pt will be compliant and knowledgeable with initial HEP for improved comfort and carryover Baseline: initial HEP given  Goal status: Met    2.  Pt will self report bilateral knee pain no greater than 6/10 for improved comfort and functional ability Baseline: 10/10 at worst;6/10 worst, 4/10 average Goal status: Met   LONG TERM GOALS: Target date: 04/10/2022   Pt will improve FOTO function score to no less than 48% as proxy for functional improvement Baseline: 47% function; 04/03/22 49% Goal status: Met   2.  Pt will self report bilateral knee pain no greater than 3/10 for improved comfort and functional ability Baseline: 10/10 at worst Goal status: INITIAL    3.  Pt will decrease Five Time Sit to Stand to no greater than 20 seconds in order to improve comfort and functional mobility Baseline: 26 seconds  Goal status: INITIAL    4.  Pt will improve bilateral knee ROM to at least 5-110 for improved comfort and functional mobility Baseline: see chart Goal status: INITIAL   PLAN:   PT FREQUENCY: 2x/week   PT DURATION: 8 weeks   PLANNED INTERVENTIONS: Therapeutic exercises, Therapeutic activity, Neuromuscular re-education, Balance training, Gait training, Patient/Family education, Self Care, Joint mobilization, Aquatic Therapy, Dry Needling, Electrical stimulation, Cryotherapy, Moist heat, Vasopneumatic device, Manual therapy, and Re-evaluation   PLAN FOR NEXT SESSION: assess HEP response, progress quad strength and knee ROM, functional mobility      Hildred Laser, PT 04/05/2022, 5:40 PM

## 2022-04-10 ENCOUNTER — Ambulatory Visit: Payer: 59 | Attending: Internal Medicine

## 2022-04-10 DIAGNOSIS — R2689 Other abnormalities of gait and mobility: Secondary | ICD-10-CM | POA: Diagnosis present

## 2022-04-10 DIAGNOSIS — G8929 Other chronic pain: Secondary | ICD-10-CM | POA: Insufficient documentation

## 2022-04-10 DIAGNOSIS — M25561 Pain in right knee: Secondary | ICD-10-CM | POA: Diagnosis present

## 2022-04-10 DIAGNOSIS — M25562 Pain in left knee: Secondary | ICD-10-CM | POA: Diagnosis not present

## 2022-04-10 DIAGNOSIS — M6281 Muscle weakness (generalized): Secondary | ICD-10-CM | POA: Diagnosis present

## 2022-04-10 NOTE — Therapy (Signed)
OUTPATIENT PHYSICAL THERAPY TREATMENT NOTE   Patient Name: Stacey Fletcher MRN: [id number] DOB:1950/04/10, 72 y.o., female Today's Date: 04/10/2022  PCP: Quitman Livings, MD  REFERRING PROVIDER: Nadara Mustard, MD   END OF SESSION:   PT End of Session - 04/10/22 1700     Visit Number 8    Number of Visits 17    Date for PT Re-Evaluation 04/10/22    Authorization Type [employer] Medicare    PT Start Time 1700    PT Stop Time 1740    PT Time Calculation (min) 40 min    Activity Tolerance Patient tolerated treatment well    Behavior During Therapy WFL for tasks assessed/performed               Past Medical History:  Diagnosis Date   Coronary artery disease    Depression    Diabetes mellitus    Fibromyalgia    Goiter    Hypertension    MI (myocardial infarction) (HCC)    Osteoarthritis    Pneumonia    10+ years ago.   Past Surgical History:  Procedure Laterality Date   ABDOMINAL HYSTERECTOMY     BREAST SURGERY     BUBBLE STUDY  12/14/2021   Procedure: BUBBLE STUDY;  Surgeon: Christell Constant, MD;  Location: [facility] ENDOSCOPY;  Service: Cardiovascular;;   CARDIAC CATHETERIZATION     1995   RIGHT/LEFT HEART CATH AND CORONARY ANGIOGRAPHY N/A 07/21/2021   Procedure: RIGHT/LEFT HEART CATH AND CORONARY ANGIOGRAPHY;  Surgeon: Runell Gess, MD;  Location: [facility] INVASIVE CV LAB;  Service: Cardiovascular;  Laterality: N/A;   TEE WITHOUT CARDIOVERSION N/A 12/14/2021   Procedure: TRANSESOPHAGEAL ECHOCARDIOGRAM (TEE);  Surgeon: Christell Constant, MD;  Location: [facility] ENDOSCOPY;  Service: Cardiovascular;  Laterality: N/A;   Patient Active Problem List   Diagnosis Date Noted   Severe mitral regurgitation 12/14/2021   Acute respiratory failure with hypoxia (HCC) 07/20/2021   Elevated troponin 07/20/2021   Acute CHF (congestive heart failure) (HCC) 07/20/2021   Pneumonia    Osteoarthritis    MI (myocardial infarction) (HCC)    Hypertension    Goiter    Fibromyalgia     Depression    Coronary artery disease    Arthritis of carpometacarpal [facility]) joint of right thumb 07/15/2018   Pain of right hand 07/15/2018   ARF (acute renal failure) (HCC) 08/20/2014   Chest pain 05/06/2013   Nausea vomiting and diarrhea 05/06/2013   Fever 05/06/2013   HTN (hypertension) 05/06/2013   Hypothyroidism 05/06/2013   Diabetes mellitus (HCC) 05/06/2013    REFERRING DIAG: M17.11 (ICD-10-CM) - Unilateral primary osteoarthritis, right knee M17.12 (ICD-10-CM) - Unilateral primary osteoarthritis, left knee    THERAPY DIAG:  Chronic pain of left knee  Chronic pain of right knee  Muscle weakness (generalized)  Rationale for Evaluation and Treatment Rehabilitation  PERTINENT HISTORY: DM II, HTN, MI   PRECAUTIONS: cardiac  SUBJECTIVE:  SUBJECTIVE STATEMENT:  Not doing as well today as previously but unable to identify cause.    PAIN:  Are you having pain? Yes: NPRS scale: 4/10 Pain location: B knees Pain description: ache Aggravating factors: weight bearing tasks Relieving factors: rest and meds   OBJECTIVE: (objective measures completed at initial evaluation unless otherwise dated)   DIAGNOSTIC FINDINGS:             See imaging   PATIENT SURVEYS:  FOTO: 17% function; 48% predicted; 04/03/22 49%   COGNITION: Overall cognitive status: Within functional limits for tasks assessed                         SENSATION: WFL   POSTURE: rounded shoulders, forward head, and knee varus   PALPATION: TTP to medial knee   LOWER EXTREMITY ROM:   Active ROM Right eval Left eval R 03/15/22 L 03/15/22  Hip flexion        Hip extension        Hip abduction        Hip adduction        Hip internal rotation        Hip external rotation        Knee flexion 101 84 112d 102d  Knee  extension 8 12 -4d -4  Ankle dorsiflexion        Ankle plantarflexion        Ankle inversion        Ankle eversion         (Blank rows = not tested)   LOWER EXTREMITY MMT:   MMT Right eval Left eval  Hip flexion      Hip extension      Hip abduction 3+/5 3+/5  Hip adduction      Hip internal rotation      Hip external rotation      Knee flexion 4/5 4/5  Knee extension 4/5 4/5  Ankle dorsiflexion      Ankle plantarflexion      Ankle inversion      Ankle eversion       (Blank rows = not tested)   LOWER EXTREMITY SPECIAL TESTS:  DNT   FUNCTIONAL TESTS:  5 times Sit to Stand: 26 seconds   GAIT: Distance walked: 49ft Assistive device utilized: None Level of assistance: SBA Comments: antalgic gait on L, slowed gait speed     TREATMENT: [facility] Adult PT Treatment:                                                DATE: 04/10/22 Therapeutic Exercise: Nustep L5 8 min S/L abduction 5# 15x Bil FAQs with adduction 15x 5# Standing ham curl 5# 15x Bil Runners step 6 in block 10/10 SLR 15x Bil 5# SAQs 5# 15x Bil PF against wall 10x SL with kickstand  [facility] Adult PT Treatment:                                                DATE: 04/05/22 Therapeutic Exercise: Nustep L4 8 min S/L abduction 4# 15x Bil FAQs with adduction 15x 4# Standing ham curl 4# 15x Bil Runners step 4 in block 10/10 SLR 15x Bil  4# SAQs 4# 15x Bil PF against wall 15x   [facility] Adult PT Treatment:                                                DATE: 04/03/22 Therapeutic Exercise: Nustep L4 8 min S/L abduction 3# 15x Bil FAQs with adduction 15x 3# Seated hamstring curl RTB x15 BIL STS from high table 10x arms crossed SLR 15x Bil 3# SAQs 3# 15x Bil     PATIENT EDUCATION:  Education details: eval findings, FOTO, HEP, POC Person educated: Patient and son Education method: Explanation, Demonstration, and Handouts Education comprehension: verbalized understanding and returned demonstration   HOME  EXERCISE PROGRAM: Access Code: [email or web address] URL: [email or web address] Date: 04/03/2022 Prepared by: Gustavus Bryant  Exercises - Active Straight Leg Raise with Quad Set  - 1-2 x daily - 5 x weekly - 2 sets - 15 reps - Supine Heel Slide  - 1-2 x daily - 5 x weekly - 2 sets - 15 reps - 5 sec hold - Seated Long Arc Quad  - 1-2 x daily - 5 x weekly - 2 sets - 15 reps - Seated Hamstring Curl with Anchored Resistance  - 2 x daily - 5 x weekly - 2 sets - 15 reps - Sit to Stand with Arms Crossed  - 2 x daily - 5 x weekly - 2 sets - 10 reps   ASSESSMENT:   CLINICAL IMPRESSION: Added resistance and difficulty as noted, increased weight to 5# on table exercises.  Progressed to 6 in step on runners step and single leg heel raises with kickstand.  Some issues with proprioception noted.    OBJECTIVE IMPAIRMENTS: Abnormal gait, decreased activity tolerance, decreased balance, decreased endurance, decreased mobility, difficulty walking, decreased ROM, decreased strength, and pain   ACTIVITY LIMITATIONS: carrying, lifting, standing, squatting, and transfers   PARTICIPATION LIMITATIONS: cleaning, laundry, driving, shopping, community activity, occupation, and yard work   PERSONAL FACTORS: Fitness, Time since onset of injury/illness/exacerbation, and 3+ comorbidities: DM II, HTN, MI  are also affecting patient's functional outcome.    REHAB POTENTIAL: Good   CLINICAL DECISION MAKING: Evolving/moderate complexity   EVALUATION COMPLEXITY: Moderate     GOALS: Goals reviewed with patient? No   SHORT TERM GOALS: Target date: 03/06/2022   Pt will be compliant and knowledgeable with initial HEP for improved comfort and carryover Baseline: initial HEP given  Goal status: Met    2.  Pt will self report bilateral knee pain no greater than 6/10 for improved comfort and functional ability Baseline: 10/10 at worst;6/10 worst, 4/10 average Goal status: Met   LONG TERM GOALS: Target date:  04/10/2022   Pt will improve FOTO function score to no less than 48% as proxy for functional improvement Baseline: 47% function; 04/03/22 49% Goal status: Met   2.  Pt will self report bilateral knee pain no greater than 3/10 for improved comfort and functional ability Baseline: 10/10 at worst Goal status: INITIAL    3.  Pt will decrease Five Time Sit to Stand to no greater than 20 seconds in order to improve comfort and functional mobility Baseline: 26 seconds  Goal status: INITIAL    4.  Pt will improve bilateral knee ROM to at least 5-110 for improved comfort and functional mobility Baseline: see chart Goal status: INITIAL   PLAN:   PT FREQUENCY: 2x/week  PT DURATION: 8 weeks   PLANNED INTERVENTIONS: Therapeutic exercises, Therapeutic activity, Neuromuscular re-education, Balance training, Gait training, Patient/Family education, Self Care, Joint mobilization, Aquatic Therapy, Dry Needling, Electrical stimulation, Cryotherapy, Moist heat, Vasopneumatic device, Manual therapy, and Re-evaluation   PLAN FOR NEXT SESSION: assess HEP response, progress quad strength and knee ROM, functional mobility      Hildred Laser, PT 04/10/2022, 5:44 PM

## 2022-04-11 ENCOUNTER — Ambulatory Visit: Payer: 59 | Admitting: Physical Therapy

## 2022-04-12 ENCOUNTER — Ambulatory Visit: Payer: 59

## 2022-04-16 ENCOUNTER — Ambulatory Visit: Payer: Medicaid Other | Admitting: Orthopedic Surgery

## 2022-04-17 ENCOUNTER — Ambulatory Visit: Payer: 59

## 2022-04-17 ENCOUNTER — Encounter (HOSPITAL_COMMUNITY): Payer: Self-pay | Admitting: Cardiology

## 2022-04-17 ENCOUNTER — Ambulatory Visit (HOSPITAL_COMMUNITY)
Admission: RE | Admit: 2022-04-17 | Discharge: 2022-04-17 | Disposition: A | Discharge status: Home / Self Care | Payer: 59 | Source: Ambulatory Visit | Attending: Cardiology | Admitting: Cardiology

## 2022-04-17 VITALS — BP 130/80 | HR 75 | Wt 216.6 lb

## 2022-04-17 DIAGNOSIS — M25562 Pain in left knee: Secondary | ICD-10-CM | POA: Diagnosis not present

## 2022-04-17 DIAGNOSIS — Z7984 Long term (current) use of oral hypoglycemic drugs: Secondary | ICD-10-CM | POA: Diagnosis not present

## 2022-04-17 DIAGNOSIS — R0603 Acute respiratory distress: Secondary | ICD-10-CM | POA: Insufficient documentation

## 2022-04-17 DIAGNOSIS — M1711 Unilateral primary osteoarthritis, right knee: Secondary | ICD-10-CM | POA: Insufficient documentation

## 2022-04-17 DIAGNOSIS — Z87891 Personal history of nicotine dependence: Secondary | ICD-10-CM | POA: Diagnosis not present

## 2022-04-17 DIAGNOSIS — I340 Nonrheumatic mitral (valve) insufficiency: Secondary | ICD-10-CM | POA: Diagnosis not present

## 2022-04-17 DIAGNOSIS — R0609 Other forms of dyspnea: Secondary | ICD-10-CM | POA: Insufficient documentation

## 2022-04-17 DIAGNOSIS — F32A Depression, unspecified: Secondary | ICD-10-CM | POA: Insufficient documentation

## 2022-04-17 DIAGNOSIS — G8929 Other chronic pain: Secondary | ICD-10-CM

## 2022-04-17 DIAGNOSIS — Z79899 Other long term (current) drug therapy: Secondary | ICD-10-CM | POA: Diagnosis not present

## 2022-04-17 DIAGNOSIS — I110 Hypertensive heart disease with heart failure: Secondary | ICD-10-CM | POA: Insufficient documentation

## 2022-04-17 DIAGNOSIS — I5022 Chronic systolic (congestive) heart failure: Secondary | ICD-10-CM | POA: Insufficient documentation

## 2022-04-17 DIAGNOSIS — I5020 Unspecified systolic (congestive) heart failure: Secondary | ICD-10-CM | POA: Diagnosis present

## 2022-04-17 DIAGNOSIS — M6281 Muscle weakness (generalized): Secondary | ICD-10-CM

## 2022-04-17 DIAGNOSIS — I2510 Atherosclerotic heart disease of native coronary artery without angina pectoris: Secondary | ICD-10-CM | POA: Diagnosis not present

## 2022-04-17 DIAGNOSIS — R2689 Other abnormalities of gait and mobility: Secondary | ICD-10-CM

## 2022-04-17 DIAGNOSIS — R079 Chest pain, unspecified: Secondary | ICD-10-CM | POA: Insufficient documentation

## 2022-04-17 DIAGNOSIS — F4310 Post-traumatic stress disorder, unspecified: Secondary | ICD-10-CM | POA: Insufficient documentation

## 2022-04-17 DIAGNOSIS — I152 Hypertension secondary to endocrine disorders: Secondary | ICD-10-CM | POA: Diagnosis not present

## 2022-04-17 DIAGNOSIS — M797 Fibromyalgia: Secondary | ICD-10-CM | POA: Insufficient documentation

## 2022-04-17 DIAGNOSIS — I252 Old myocardial infarction: Secondary | ICD-10-CM | POA: Diagnosis not present

## 2022-04-17 DIAGNOSIS — E119 Type 2 diabetes mellitus without complications: Secondary | ICD-10-CM | POA: Diagnosis not present

## 2022-04-17 DIAGNOSIS — E1159 Type 2 diabetes mellitus with other circulatory complications: Secondary | ICD-10-CM | POA: Diagnosis not present

## 2022-04-17 LAB — DIGOXIN LEVEL: Digoxin Level: 1.4 ng/mL (ref 0.8–2.0)

## 2022-04-17 LAB — BASIC METABOLIC PANEL
Anion gap: 7 (ref 5–15)
BUN: 15 mg/dL (ref 8–23)
CO2: 26 mmol/L (ref 22–32)
Calcium: 9.5 mg/dL (ref 8.9–10.3)
Chloride: 107 mmol/L (ref 98–111)
Creatinine, Ser: 0.94 mg/dL (ref 0.44–1.00)
GFR, Estimated: >60 mL/min (ref >60)
Glucose, Bld: 120 mg/dL — ABNORMAL HIGH (ref 70–99)
Potassium: 4.2 mmol/L (ref 3.5–5.1)
Sodium: 140 mmol/L (ref 135–145)

## 2022-04-17 LAB — BRAIN NATRIURETIC PEPTIDE: B Natriuretic Peptide: 142.8 pg/mL — ABNORMAL HIGH (ref 0.0–100.0)

## 2022-04-17 LAB — TSH: TSH: 2.276 u[IU]/mL (ref 0.350–4.500)

## 2022-04-17 MED ORDER — LOSARTAN POTASSIUM 100 MG PO TABS: 100.0000 mg | ORAL_TABLET | Freq: Every day | ORAL | 3 refills | Status: DC

## 2022-04-17 NOTE — Therapy (Signed)
OUTPATIENT PHYSICAL THERAPY TREATMENT NOTE   Patient Name: Stacey Fletcher MRN: [id number] DOB:1950-12-07, 72 y.o., female Today's Date: 04/17/2022  PCP: Quitman Livings, MD  REFERRING PROVIDER: Nadara Mustard, MD   END OF SESSION:   PT End of Session - 04/17/22 1654     Visit Number 9    Number of Visits 17    Date for PT Re-Evaluation 04/10/22    Authorization Type [employer] Medicare    PT Start Time 1655    PT Stop Time 1735    PT Time Calculation (min) 40 min    Activity Tolerance Patient tolerated treatment well    Behavior During Therapy WFL for tasks assessed/performed                Past Medical History:  Diagnosis Date   Coronary artery disease    Depression    Diabetes mellitus    Fibromyalgia    Goiter    Hypertension    MI (myocardial infarction) (HCC)    Osteoarthritis    Pneumonia    10+ years ago.   Past Surgical History:  Procedure Laterality Date   ABDOMINAL HYSTERECTOMY     BREAST SURGERY     BUBBLE STUDY  12/14/2021   Procedure: BUBBLE STUDY;  Surgeon: Christell Constant, MD;  Location: [facility] ENDOSCOPY;  Service: Cardiovascular;;   CARDIAC CATHETERIZATION     1995   RIGHT/LEFT HEART CATH AND CORONARY ANGIOGRAPHY N/A 07/21/2021   Procedure: RIGHT/LEFT HEART CATH AND CORONARY ANGIOGRAPHY;  Surgeon: Runell Gess, MD;  Location: [facility] INVASIVE CV LAB;  Service: Cardiovascular;  Laterality: N/A;   TEE WITHOUT CARDIOVERSION N/A 12/14/2021   Procedure: TRANSESOPHAGEAL ECHOCARDIOGRAM (TEE);  Surgeon: Christell Constant, MD;  Location: [facility] ENDOSCOPY;  Service: Cardiovascular;  Laterality: N/A;   Patient Active Problem List   Diagnosis Date Noted   Severe mitral regurgitation 12/14/2021   Acute respiratory failure with hypoxia (HCC) 07/20/2021   Elevated troponin 07/20/2021   Acute CHF (congestive heart failure) (HCC) 07/20/2021   Pneumonia    Osteoarthritis    MI (myocardial infarction) (HCC)    Hypertension    Goiter    Fibromyalgia     Depression    Coronary artery disease    Arthritis of carpometacarpal [facility]) joint of right thumb 07/15/2018   Pain of right hand 07/15/2018   ARF (acute renal failure) (HCC) 08/20/2014   Chest pain 05/06/2013   Nausea vomiting and diarrhea 05/06/2013   Fever 05/06/2013   HTN (hypertension) 05/06/2013   Hypothyroidism 05/06/2013   Diabetes mellitus (HCC) 05/06/2013    REFERRING DIAG: M17.11 (ICD-10-CM) - Unilateral primary osteoarthritis, right knee M17.12 (ICD-10-CM) - Unilateral primary osteoarthritis, left knee    THERAPY DIAG:  Chronic pain of left knee  Chronic pain of right knee  Muscle weakness (generalized)  Other abnormalities of gait and mobility  Rationale for Evaluation and Treatment Rehabilitation  PERTINENT HISTORY: DM II, HTN, MI   PRECAUTIONS: cardiac  SUBJECTIVE:  SUBJECTIVE STATEMENT:  Knee pain 1-2/10 over past 2 days.  Received a good report from her cardiologist as CV status improving    PAIN:  Are you having pain? Yes: NPRS scale: 4/10 Pain location: B knees Pain description: ache Aggravating factors: weight bearing tasks Relieving factors: rest and meds   OBJECTIVE: (objective measures completed at initial evaluation unless otherwise dated)   DIAGNOSTIC FINDINGS:             See imaging   PATIENT SURVEYS:  FOTO: 17% function; 48% predicted; 04/03/22 49%   COGNITION: Overall cognitive status: Within functional limits for tasks assessed                         SENSATION: WFL   POSTURE: rounded shoulders, forward head, and knee varus   PALPATION: TTP to medial knee   LOWER EXTREMITY ROM:   Active ROM Right eval Left eval R 03/15/22 L 03/15/22 R 04/17/22 L 04/17/22  Hip flexion          Hip extension          Hip abduction          Hip adduction           Hip internal rotation          Hip external rotation          Knee flexion 101 84 112d 102d 115d 98d  Knee extension 8 12 -4d -4 -4d -4d  Ankle dorsiflexion          Ankle plantarflexion          Ankle inversion          Ankle eversion           (Blank rows = not tested)   LOWER EXTREMITY MMT:   MMT Right eval Left eval R 04/17/22 L  04/17/22  Hip flexion        Hip extension        Hip abduction 3+/5 3+/5 4/5 4/5  Hip adduction        Hip internal rotation        Hip external rotation        Knee flexion 4/5 4/5 4/5 4/5  Knee extension 4/5 4/5 4/5 4/5  Ankle dorsiflexion        Ankle plantarflexion        Ankle inversion        Ankle eversion         (Blank rows = not tested)   LOWER EXTREMITY SPECIAL TESTS:  DNT   FUNCTIONAL TESTS:  5 times Sit to Stand: 26 seconds; 04/17/22 19s arms crossed   GAIT: Distance walked: 49ft Assistive device utilized: None Level of assistance: SBA Comments: antalgic gait on L, slowed gait speed     TREATMENT: [facility] Adult PT Treatment:                                                DATE: 04/17/22 Therapeutic Exercise: Nustep L4 8 min S/L abduction 5# 15x Bil FAQs with adduction 15x 5# Standing ham curl 5# 15x Bil Runners step 6 in block 10/10 5# KB in opposite hand SLR 15x Bil 5# SAQs 5# 15x Bil PF against wall 15x SL with kickstand   [facility] Adult PT Treatment:  DATE: 04/10/22 Therapeutic Exercise: Nustep L5 8 min S/L abduction 5# 15x Bil FAQs with adduction 15x 5# Standing ham curl 5# 15x Bil Runners step 6 in block 10/10 SLR 15x Bil 5# SAQs 5# 15x Bil PF against wall 10x SL with kickstand  [facility] Adult PT Treatment:                                                DATE: 04/05/22 Therapeutic Exercise: Nustep L4 8 min S/L abduction 4# 15x Bil FAQs with adduction 15x 4# Standing ham curl 4# 15x Bil Runners step 4 in block 10/10 SLR 15x Bil 4# SAQs 4# 15x Bil PF against  wall 15x   [facility] Adult PT Treatment:                                                DATE: 04/03/22 Therapeutic Exercise: Nustep L4 8 min S/L abduction 3# 15x Bil FAQs with adduction 15x 3# Seated hamstring curl RTB x15 BIL STS from high table 10x arms crossed SLR 15x Bil 3# SAQs 3# 15x Bil     PATIENT EDUCATION:  Education details: eval findings, FOTO, HEP, POC Person educated: Patient and son Education method: Explanation, Demonstration, and Handouts Education comprehension: verbalized understanding and returned demonstration   HOME EXERCISE PROGRAM: Access Code: [email or web address] URL: [email or web address] Date: 04/03/2022 Prepared by: Gustavus Bryant  Exercises - Active Straight Leg Raise with Quad Set  - 1-2 x daily - 5 x weekly - 2 sets - 15 reps - Supine Heel Slide  - 1-2 x daily - 5 x weekly - 2 sets - 15 reps - 5 sec hold - Seated Long Arc Quad  - 1-2 x daily - 5 x weekly - 2 sets - 15 reps - Seated Hamstring Curl with Anchored Resistance  - 2 x daily - 5 x weekly - 2 sets - 15 reps - Sit to Stand with Arms Crossed  - 2 x daily - 5 x weekly - 2 sets - 10 reps   ASSESSMENT:   CLINICAL IMPRESSION: Re-assessed progress towards goals, 5x STS time decreased, R knee ROM increased, L knee ROM fluctuates based on symptoms.  Added reps to unilateral heel raises and increased challenge of runners step by holding KB in opposite hand.    OBJECTIVE IMPAIRMENTS: Abnormal gait, decreased activity tolerance, decreased balance, decreased endurance, decreased mobility, difficulty walking, decreased ROM, decreased strength, and pain   ACTIVITY LIMITATIONS: carrying, lifting, standing, squatting, and transfers   PARTICIPATION LIMITATIONS: cleaning, laundry, driving, shopping, community activity, occupation, and yard work   PERSONAL FACTORS: Fitness, Time since onset of injury/illness/exacerbation, and 3+ comorbidities: DM II, HTN, MI  are also affecting patient's functional  outcome.    REHAB POTENTIAL: Good   CLINICAL DECISION MAKING: Evolving/moderate complexity   EVALUATION COMPLEXITY: Moderate     GOALS: Goals reviewed with patient? No   SHORT TERM GOALS: Target date: 03/06/2022   Pt will be compliant and knowledgeable with initial HEP for improved comfort and carryover Baseline: initial HEP given  Goal status: Met    2.  Pt will self report bilateral knee pain no greater than 6/10 for improved comfort and functional ability Baseline: 10/10 at worst;6/10 worst, 4/10 average Goal  status: Met   LONG TERM GOALS: Target date: 04/10/2022   Pt will improve FOTO function score to no less than 48% as proxy for functional improvement Baseline: 47% function; 04/03/22 49% Goal status: Met   2.  Pt will self report bilateral knee pain no greater than 3/10 for improved comfort and functional ability Baseline: 10/10 at worst; 04/17/22 12-/10 over past 2 days Goal status: Ongoing   3.  Pt will decrease Five Time Sit to Stand to no greater than 20 seconds in order to improve comfort and functional mobility Baseline: 26 seconds; 04/17/22 19s arms crossed Goal status: Met    4.  Pt will improve bilateral knee ROM to at least 5-110 for improved comfort and functional mobility Baseline: see chart Goal status: INITIAL   PLAN:   PT FREQUENCY: 2x/week   PT DURATION: 8 weeks   PLANNED INTERVENTIONS: Therapeutic exercises, Therapeutic activity, Neuromuscular re-education, Balance training, Gait training, Patient/Family education, Self Care, Joint mobilization, Aquatic Therapy, Dry Needling, Electrical stimulation, Cryotherapy, Moist heat, Vasopneumatic device, Manual therapy, and Re-evaluation   PLAN FOR NEXT SESSION: assess HEP response, progress quad strength and knee ROM, functional mobility      Hildred Laser, PT 04/17/2022, 5:42 PM

## 2022-04-17 NOTE — Patient Instructions (Signed)
INCREASE Losartan to '100mg'$  daily.  Labs done today, your results will be available in MyChart, we will contact you for abnormal readings.  Please follow up with our heart failure pharmacist in 6 weeks  Your physician recommends that you schedule a follow-up appointment in: 3 months ( June ) ** please call the office in April to arrange your follow up appointment. **  If you have any questions or concerns before your next appointment please send [place] a message through [place] or call our office at [phone].    TO LEAVE A MESSAGE FOR THE NURSE SELECT OPTION 2, PLEASE LEAVE A MESSAGE INCLUDING: YOUR NAME DATE OF BIRTH CALL BACK NUMBER REASON FOR CALL**this is important as we prioritize the call backs  YOU WILL RECEIVE A CALL BACK THE SAME DAY AS LONG AS YOU CALL BEFORE 4:00 PM  At the [facility], you and your health needs are our priority. As part of our continuing mission to provide you with exceptional heart care, we have created designated Provider Care Teams. These Care Teams include your primary Cardiologist (physician) and Advanced Practice Providers (APPs- Physician Assistants and Nurse Practitioners) who all work together to provide you with the care you need, when you need it.   You may see any of the following providers on your designated Care Team at your next follow up: Dr Glori Bickers Dr Loralie Champagne Dr. Roxana Hires, NP Lyda Jester, [place] [facility], [place] Forestine Na, NP Audry Riles, PharmD   Please be sure to bring in all your medications bottles to every appointment.    Thank you for choosing [facility]

## 2022-04-17 NOTE — Progress Notes (Signed)
ADVANCED HEART FAILURE CLINIC NOTE  Referring Physician: Antonietta Jewel, MD  Primary Care: Stacey Jewel, MD Primary HF: Dr. Daniel Fletcher  HPI: Stacey Fletcher is a 72 y.o. female with  HTN, T2DM, PTSD from son's death and diagnosis of HFrEF made last summer. In June of 2023, she reports waking up in her sleep with severe respiratory distress after a few weeks of worsening DOE. She was admitted to [facility] where she was found to have an LVEF of 30-35%; she was diuresed with IV lasix and discharged on low dose GDMT. She had a subsequent TTE in September 2023 w/ worsening LV function (LVEF 20-25%) and moderate to severe MR. Since that time she has also undergone TEE confirming presence of moderate to severe MR for which she has seen structural cardiology. Otherwise, Stacey Fletcher attempts to be as active as possible. She feels very limited by right knee osteoarthritis for which she is seeing orthopedic surgery. She is originally from [place] but moved to [place] after the murder of her son. She has excellent support here from her 2nd son.   Interval hx: She does not have as much knee pain; she is now walking longer durations. She is now able to stand and cook food. She does not become significantly short of breath but has not pushed herself. She is continuing to partake in physical therapy.   Activity level/exercise tolerance:  NYHA IIB, limited by significant knee osteoarthritis Orthopnea:  Sleeps on 2 pillows Paroxysmal noctural dyspnea:  infrequent Chest pain/pressure:  yes Orthostatic lightheadedness:  no Palpitations:  no Lower extremity edema:  no Presyncope/syncope:  no Cough:  no  Past Medical History:  Diagnosis Date   Coronary artery disease    Depression    Diabetes mellitus    Fibromyalgia    Goiter    Hypertension    MI (myocardial infarction) ([place])    Osteoarthritis    Pneumonia    10+ years ago.    Current Outpatient Medications  Medication Sig Dispense Refill    aspirin 81 MG chewable tablet Chew 81 mg by mouth in the morning.     buPROPion (WELLBUTRIN XL) 300 MG 24 hr tablet Take 300 mg by mouth every morning.     cetirizine (ZYRTEC) 10 MG tablet Take 10 mg by mouth daily as needed for allergies.     dapagliflozin propanediol (FARXIGA) 10 MG TABS tablet Take 1 tablet (10 mg total) by mouth daily. 90 tablet 3   digoxin (LANOXIN) 0.125 MG tablet Take 1 tablet (0.125 mg total) by mouth daily. 90 tablet 3   diphenhydrAMINE (BENADRYL) 25 MG tablet Take 25 mg by mouth at bedtime.     fluticasone (FLONASE) 50 MCG/ACT nasal spray Place 1 spray into both nostrils daily as needed for allergies or rhinitis.     furosemide (LASIX) 40 MG tablet Take 1 tablet (40 mg total) by mouth every other day. 45 tablet 3   gabapentin (NEURONTIN) 100 MG capsule TAKE 1 CAPSULE(100 MG) BY MOUTH AT BEDTIME 30 capsule 6   HYDROcodone-acetaminophen (NORCO/VICODIN) 5-325 MG tablet Take 1 tablet by mouth every 8 (eight) hours as needed for moderate pain.     levothyroxine (SYNTHROID) 25 MCG tablet Take 25 mcg by mouth daily before breakfast.     losartan (COZAAR) 50 MG tablet Take 1 tablet (50 mg total) by mouth daily. 90 tablet 3   Menthol-Methyl Salicylate (MUSCLE RUB) 10-15 % CREA Apply 1 Application topically 2 (two) times a week.  metFORMIN (GLUCOPHAGE) 1000 MG tablet Take 1,000 mg by mouth daily.     metoprolol succinate (TOPROL-XL) 100 MG 24 hr tablet Take 1 tablet (100 mg total) by mouth daily. Take with or immediately following a meal. 90 tablet 3   Potassium Chloride ER 20 MEQ TBCR TAKE 1 TABLET BY MOUTH EVERY OTHER DAY 45 tablet 3   pravastatin (PRAVACHOL) 20 MG tablet Take 20 mg by mouth in the morning.     spironolactone (ALDACTONE) 25 MG tablet Take 1 tablet (25 mg total) by mouth daily. 90 tablet 3   venlafaxine XR (EFFEXOR-XR) 75 MG 24 hr capsule Take 75 mg by mouth in the morning, at noon, and at bedtime.     No current facility-administered medications for this  encounter.    Allergies  Allergen Reactions   Entresto [Sacubitril-Valsartan] Hives   Prednisone Shortness Of Breath and Rash   Ace Inhibitors Other (See Comments)    Dizzy headaches, crawling feeling inside of arm   Empagliflozin Other (See Comments)    uti/yeast infection   Sitagliptin Itching   Procardia [Nifedipine] Palpitations    "heart attack symptoms"      Social History   Socioeconomic History   Marital status: Divorced    Spouse name: Not on file   Number of children: 3   Years of education: Not on file   Highest education level: Bachelor's degree (e.g., BA, AB, BS)  Occupational History   Occupation: retired  Tobacco Use   Smoking status: Former    Types: Cigarettes    Quit date: 07/07/2018    Years since quitting: 3.7   Smokeless tobacco: Never  Vaping Use   Vaping Use: Never used  Substance and Sexual Activity   Alcohol use: No   Drug use: No   Sexual activity: Not Currently  Other Topics Concern   Not on file  Social History Narrative   Not on file   Social Determinants of Health   Financial Resource Strain: Low Risk  (07/21/2021)   Overall Financial Resource Strain (CARDIA)    Difficulty of Paying Living Expenses: Not very hard  Food Insecurity: No Food Insecurity (07/21/2021)   Hunger Vital Sign    Worried About Running Out of Food in the Last Year: Never true    Ran Out of Food in the Last Year: Never true  Transportation Needs: No Transportation Needs (07/21/2021)   PRAPARE - Hydrologist (Medical): No    Lack of Transportation (Non-Medical): No  Physical Activity: Not on file  Stress: Not on file  Social Connections: Not on file  Intimate Partner Violence: Not on file      Family History  Problem Relation Age of Onset   Hypertension Mother    Diabetes Mother    Stroke Mother    Hypertension Other    Diabetes Other     PHYSICAL EXAM: Vitals:   04/17/22 1133  BP: 130/80  Pulse: 75  SpO2: 96%    GENERAL: Well nourished, well developed, and in no apparent distress at rest.  HEENT: Negative for arcus senilis or xanthelasma. There is no scleral icterus.  The mucous membranes are pink and moist.   NECK: Supple, No masses. Normal carotid upstrokes without bruits. No masses or thyromegaly.    CHEST: There are no chest wall deformities. There is no chest wall tenderness. Respirations are unlabored.  Lungs- CTA B/L CARDIAC:  JVP: 7 cm  Normal rate with regular rhythm. No murmurs, rubs or gallops.  Pulses are 2+ and symmetrical in upper and lower extremities. No edema.  ABDOMEN: Soft, non-tender, non-distended. There are no masses or hepatomegaly. There are normal bowel sounds.  EXTREMITIES: Warm and well perfused with no cyanosis, clubbing.  LYMPHATIC: No axillary or supraclavicular lymphadenopathy.  NEUROLOGIC: Patient is oriented x3 with no focal or lateralizing neurologic deficits.  PSYCH: Patients affect is appropriate, there is no evidence of anxiety or depression.  SKIN: Warm and dry; no lesions or wounds.     DATA REVIEW  ECG: 01/09/22: NSR   ECHO: 02/20/21: LVEF 20%, moderate MR 10/24/21: LVEF 20-25%, mod-severe MR 07/20/21: LVEF 30-35%, Grade II DD, mild MR 02/20/22: LVEF [id number], grade 1 diastolic dysfunction, moderate mitral regurgitation.  CMR: 02/20/21 1. Severely dilated LV, severely reduced LV systolic function, LVEF [id number].  2. There is a thin segment of late gadolinium enhancement in the left ventricular myocardium septal mid wall.  3.  Mild mitral regurgitation.  4.  Normal RV size and function.  5.  Findings consistent with nonischemic dilated cardiomyopathy.  CATH: 07/21/21 No CAD 1: Right atrial pressure-14/9, mean 9 2: Right ventricular pressure-50/7 3: Pulmonary artery pressure-48/27, mean 36 4: Pulmonary wedge pressure-A-wave 35, V wave 43, mean 36 5: LVEDP-33 6: Cardiac output-5.39 L/min with an index of 2.46 L/min/m    ASSESSMENT &  PLAN:  Heart failure with reduced EF Etiology of [id number]; plan to evaluate for sarcoid with cardiac PET.  NYHA class / AHA Stage: Significant improvement after uptitrating medical therapy and starting physical therapy. Now likely NYHA IIB. Volume status & Diuretics: Euvolemic, continue lasix '40mg'$  daily Vasodilators: Increase losartan to '100mg'$  daily.  Beta-Blocker:Toprol '100mg'$  daily; continue digoxin 171mg daily MCK:[id number]'25mg'$  daily Cardiometabolic:farxiga '10mg'$  daily Devices therapies & Valvulopathies:EP referral for primary prevention ICD Advanced therapies: When we first starting managing Ms. Tegethoff she was unable to walk more than 15-20 ft and had a fairly poor baseline functional status. We have slowly uptitrated meds and started physical therapy. She is now able to work around the home and will try to mBradleyher yard this month. She had a CPX that does not demonstrate significant cardiac limitations at this point. Will plan for cardiac PET in the near future.   2. Moderate Mitral regurgitation  - Continue GDMT;   3. HTN - well controlled now, increase losartan today.   Stacey Fletcher Advanced Heart Failure Mechanical Circulatory Support

## 2022-04-19 ENCOUNTER — Ambulatory Visit: Payer: 59

## 2022-04-19 DIAGNOSIS — M25562 Pain in left knee: Secondary | ICD-10-CM | POA: Diagnosis not present

## 2022-04-19 DIAGNOSIS — G8929 Other chronic pain: Secondary | ICD-10-CM

## 2022-04-19 DIAGNOSIS — R2689 Other abnormalities of gait and mobility: Secondary | ICD-10-CM

## 2022-04-19 DIAGNOSIS — M6281 Muscle weakness (generalized): Secondary | ICD-10-CM

## 2022-04-19 NOTE — Therapy (Signed)
OUTPATIENT PHYSICAL THERAPY TREATMENT NOTE/10TH VISIT PROGRESS NOTE   Patient Name: Genesis Aylsworth MRN: [id number] DOB:03/12/1950, 72 y.o., female Today's Date: 04/19/2022  PCP: Quitman Livings, MD  REFERRING PROVIDER: Nadara Mustard, MD   END OF SESSION:   PT End of Session - 04/19/22 1712     Visit Number 10    Number of Visits 17    Date for PT Re-Evaluation 04/10/22    Authorization Type [employer] Medicare    PT Start Time 1715   patient arrived late   PT Stop Time 1810    PT Time Calculation (min) 55 min    Activity Tolerance Patient tolerated treatment well    Behavior During Therapy WFL for tasks assessed/performed                Past Medical History:  Diagnosis Date   Coronary artery disease    Depression    Diabetes mellitus    Fibromyalgia    Goiter    Hypertension    MI (myocardial infarction) (HCC)    Osteoarthritis    Pneumonia    10+ years ago.   Past Surgical History:  Procedure Laterality Date   ABDOMINAL HYSTERECTOMY     BREAST SURGERY     BUBBLE STUDY  12/14/2021   Procedure: BUBBLE STUDY;  Surgeon: Christell Constant, MD;  Location: [facility];  Service: Cardiovascular;;   CARDIAC CATHETERIZATION     1995   RIGHT/LEFT HEART CATH AND CORONARY ANGIOGRAPHY N/A 07/21/2021   Procedure: RIGHT/LEFT HEART CATH AND CORONARY ANGIOGRAPHY;  Surgeon: Runell Gess, MD;  Location: [facility];  Service: Cardiovascular;  Laterality: N/A;   TEE WITHOUT CARDIOVERSION N/A 12/14/2021   Procedure: TRANSESOPHAGEAL ECHOCARDIOGRAM (TEE);  Surgeon: Christell Constant, MD;  Location: [facility] ENDOSCOPY;  Service: Cardiovascular;  Laterality: N/A;   Patient Active Problem List   Diagnosis Date Noted   Severe mitral regurgitation 12/14/2021   Acute respiratory failure with hypoxia (HCC) 07/20/2021   Elevated troponin 07/20/2021   Acute CHF (congestive heart failure) (HCC) 07/20/2021   Pneumonia    Osteoarthritis    MI (myocardial infarction) (HCC)     Hypertension    Goiter    Fibromyalgia    Depression    Coronary artery disease    Arthritis of carpometacarpal [facility]) joint of right thumb 07/15/2018   Pain of right hand 07/15/2018   ARF (acute renal failure) (HCC) 08/20/2014   Chest pain 05/06/2013   Nausea vomiting and diarrhea 05/06/2013   Fever 05/06/2013   HTN (hypertension) 05/06/2013   Hypothyroidism 05/06/2013   Diabetes mellitus (HCC) 05/06/2013    REFERRING DIAG: M17.11 (ICD-10-CM) - Unilateral primary osteoarthritis, right knee M17.12 (ICD-10-CM) - Unilateral primary osteoarthritis, left knee    THERAPY DIAG:  Chronic pain of left knee  Chronic pain of right knee  Muscle weakness (generalized)  Other abnormalities of gait and mobility  Rationale for Evaluation and Treatment Rehabilitation  PERTINENT HISTORY: DM II, HTN, MI   PRECAUTIONS: cardiac  SUBJECTIVE:  SUBJECTIVE STATEMENT:  Knee pain 1-2/10 over past 2 days.  Received a good report from her cardiologist as CV status improving    PAIN:  Are you having pain? Yes: NPRS scale: 4/10 Pain location: B knees Pain description: ache Aggravating factors: weight bearing tasks Relieving factors: rest and meds   OBJECTIVE: (objective measures completed at initial evaluation unless otherwise dated)   DIAGNOSTIC FINDINGS:             See imaging   PATIENT SURVEYS:  FOTO: 17% function; 48% predicted; 04/03/22 49%; 04/19/22 57%   COGNITION: Overall cognitive status: Within functional limits for tasks assessed                         SENSATION: WFL   POSTURE: rounded shoulders, forward head, and knee varus   PALPATION: TTP to medial knee   LOWER EXTREMITY ROM:   Active ROM Right eval Left eval R 03/15/22 L 03/15/22 R 04/17/22 L 04/17/22 L 04/19/22  Hip flexion            Hip extension           Hip abduction           Hip adduction           Hip internal rotation           Hip external rotation           Knee flexion 101 84 112d 102d 115d 98d 105d  Knee extension 8 12 -4d -4 -4d -4d   Ankle dorsiflexion           Ankle plantarflexion           Ankle inversion           Ankle eversion            (Blank rows = not tested)   LOWER EXTREMITY MMT:   MMT Right eval Left eval R 04/17/22 L  04/17/22  Hip flexion        Hip extension        Hip abduction 3+/5 3+/5 4/5 4/5  Hip adduction        Hip internal rotation        Hip external rotation        Knee flexion 4/5 4/5 4/5 4/5  Knee extension 4/5 4/5 4/5 4/5  Ankle dorsiflexion        Ankle plantarflexion        Ankle inversion        Ankle eversion         (Blank rows = not tested)   LOWER EXTREMITY SPECIAL TESTS:  DNT   FUNCTIONAL TESTS:  5 times Sit to Stand: 26 seconds; 04/17/22 19s arms crossed   GAIT: Distance walked: 13ft Assistive device utilized: None Level of assistance: SBA Comments: antalgic gait on L, slowed gait speed     TREATMENT: [facility] Adult PT Treatment:                                                DATE: 04/19/22 Therapeutic Exercise: Nustep L4 8 min Runners step 6 in block 10/10 10# KB in opposite hand Bridge with ball 15x 16 steps with B rails using step through pattern Supine hip fallouts GTB 15x B, 15/15 unilaterally Manual Therapy: Skilled palpation of B  gastrocs to identify source of discomfort.  Palpable nodule noted to medial and lateral gastroc heads. Trigger Point Dry Needling Treatment: Pre-treatment instruction: Patient instructed on dry needling rationale, procedures, and possible side effects including pain during treatment (achy,cramping feeling), bruising, drop of blood, lightheadedness, nausea, sweating. Patient Consent Given: Yes Education handout provided: Yes Muscles treated: B gastrocs  Needle size and number: .25x72mm x 2 Electrical  stimulation performed: No Parameters: N/A Treatment response/outcome: Twitch response elicited and Palpable decrease in muscle tension Post-treatment instructions: Patient instructed to expect possible mild to moderate muscle soreness later today and/or tomorrow. Patient instructed in methods to reduce muscle soreness and to continue prescribed HEP. If patient was dry needled over the lung field, patient was instructed on signs and symptoms of pneumothorax and, however unlikely, to see immediate medical attention should they occur. Patient was also educated on signs and symptoms of infection and to seek medical attention should they occur. Patient verbalized understanding of these instructions and education.   [facility] Adult PT Treatment:                                                DATE: 04/17/22 Therapeutic Exercise: Nustep L4 8 min S/L abduction 5# 15x Bil FAQs with adduction 15x 5# Standing ham curl 5# 15x Bil Runners step 6 in block 10/10 5# KB in opposite hand SLR 15x Bil 5# SAQs 5# 15x Bil PF against wall 15x SL with kickstand   [facility] Adult PT Treatment:                                                DATE: 04/10/22 Therapeutic Exercise: Nustep L5 8 min S/L abduction 5# 15x Bil FAQs with adduction 15x 5# Standing ham curl 5# 15x Bil Runners step 6 in block 10/10 SLR 15x Bil 5# SAQs 5# 15x Bil PF against wall 10x SL with kickstand  [facility] Adult PT Treatment:                                                DATE: 04/05/22 Therapeutic Exercise: Nustep L4 8 min S/L abduction 4# 15x Bil FAQs with adduction 15x 4# Standing ham curl 4# 15x Bil Runners step 4 in block 10/10 SLR 15x Bil 4# SAQs 4# 15x Bil PF against wall 15x   [facility] Adult PT Treatment:                                                DATE: 04/03/22 Therapeutic Exercise: Nustep L4 8 min S/L abduction 3# 15x Bil FAQs with adduction 15x 3# Seated hamstring curl RTB x15 BIL STS from high table 10x arms crossed SLR 15x Bil  3# SAQs 3# 15x Bil     PATIENT EDUCATION:  Education details: eval findings, FOTO, HEP, POC Person educated: Patient and son Education method: Explanation, Demonstration, and Handouts Education comprehension: verbalized understanding and returned demonstration   HOME EXERCISE PROGRAM: Access Code: [email or web address] URL: [email or web address] Date:  04/03/2022 Prepared by: Gustavus Bryant  Exercises - Active Straight Leg Raise with Quad Set  - 1-2 x daily - 5 x weekly - 2 sets - 15 reps - Supine Heel Slide  - 1-2 x daily - 5 x weekly - 2 sets - 15 reps - 5 sec hold - Seated Long Arc Quad  - 1-2 x daily - 5 x weekly - 2 sets - 15 reps - Seated Hamstring Curl with Anchored Resistance  - 2 x daily - 5 x weekly - 2 sets - 15 reps - Sit to Stand with Arms Crossed  - 2 x daily - 5 x weekly - 2 sets - 10 reps   ASSESSMENT:   CLINICAL IMPRESSION: Patient has met pain and FOTO goals.  Main limitation reported was gastroc pain most likely from overuse to relive stress on knees.  Offered TPDN which markedly reduced pain level and muscle tension.  Added additional hip strengthening tasks as well today    OBJECTIVE IMPAIRMENTS: Abnormal gait, decreased activity tolerance, decreased balance, decreased endurance, decreased mobility, difficulty walking, decreased ROM, decreased strength, and pain   ACTIVITY LIMITATIONS: carrying, lifting, standing, squatting, and transfers   PARTICIPATION LIMITATIONS: cleaning, laundry, driving, shopping, community activity, occupation, and yard work   PERSONAL FACTORS: Fitness, Time since onset of injury/illness/exacerbation, and 3+ comorbidities: DM II, HTN, MI  are also affecting patient's functional outcome.    REHAB POTENTIAL: Good   CLINICAL DECISION MAKING: Evolving/moderate complexity   EVALUATION COMPLEXITY: Moderate     GOALS: Goals reviewed with patient? No   SHORT TERM GOALS: Target date: 03/06/2022   Pt will be compliant and  knowledgeable with initial HEP for improved comfort and carryover Baseline: initial HEP given  Goal status: Met    2.  Pt will self report bilateral knee pain no greater than 6/10 for improved comfort and functional ability Baseline: 10/10 at worst;6/10 worst, 4/10 average Goal status: Met   LONG TERM GOALS: Target date: 04/10/2022   Pt will improve FOTO function score to no less than 48% as proxy for functional improvement Baseline: 47% function; 04/03/22 49% Goal status: Met   2.  Pt will self report bilateral knee pain no greater than 3/10 for improved comfort and functional ability Baseline: 10/10 at worst; 04/17/22 1-2/10 over past 2 days Goal status: Ongoing   3.  Pt will decrease Five Time Sit to Stand to no greater than 20 seconds in order to improve comfort and functional mobility Baseline: 26 seconds; 04/17/22 19s arms crossed Goal status: Met    4.  Pt will improve bilateral knee ROM to at least 5-110 for improved comfort and functional mobility Baseline: see chart Goal status: INITIAL   PLAN:   PT FREQUENCY: 2x/week   PT DURATION: 8 weeks   PLANNED INTERVENTIONS: Therapeutic exercises, Therapeutic activity, Neuromuscular re-education, Balance training, Gait training, Patient/Family education, Self Care, Joint mobilization, Aquatic Therapy, Dry Needling, Electrical stimulation, Cryotherapy, Moist heat, Vasopneumatic device, Manual therapy, and Re-evaluation   PLAN FOR NEXT SESSION: assess HEP response, progress quad strength and knee ROM, functional mobility      Hildred Laser, PT 04/19/2022, 6:12 PM

## 2022-04-24 ENCOUNTER — Ambulatory Visit: Payer: 59

## 2022-04-24 DIAGNOSIS — R2689 Other abnormalities of gait and mobility: Secondary | ICD-10-CM

## 2022-04-24 DIAGNOSIS — M25562 Pain in left knee: Secondary | ICD-10-CM | POA: Diagnosis not present

## 2022-04-24 DIAGNOSIS — M6281 Muscle weakness (generalized): Secondary | ICD-10-CM

## 2022-04-24 DIAGNOSIS — G8929 Other chronic pain: Secondary | ICD-10-CM

## 2022-04-24 NOTE — Therapy (Signed)
OUTPATIENT PHYSICAL THERAPY TREATMENT NOTE/10TH VISIT PROGRESS NOTE   Patient Name: Stacey Fletcher MRN: [id number] DOB:1950/08/10, 72 y.o., female Today's Date: 04/24/2022  PCP: Quitman Livings, MD  REFERRING PROVIDER: Nadara Mustard, MD   END OF SESSION:   PT End of Session - 04/24/22 1704     Visit Number 11    Number of Visits 17    Date for PT Re-Evaluation 04/10/22    Authorization Type [employer] Medicare    PT Start Time 1700    PT Stop Time 1740    PT Time Calculation (min) 40 min    Activity Tolerance Patient tolerated treatment well    Behavior During Therapy WFL for tasks assessed/performed                Past Medical History:  Diagnosis Date   Coronary artery disease    Depression    Diabetes mellitus    Fibromyalgia    Goiter    Hypertension    MI (myocardial infarction) (HCC)    Osteoarthritis    Pneumonia    10+ years ago.   Past Surgical History:  Procedure Laterality Date   ABDOMINAL HYSTERECTOMY     BREAST SURGERY     BUBBLE STUDY  12/14/2021   Procedure: BUBBLE STUDY;  Surgeon: Christell Constant, MD;  Location: [facility] ENDOSCOPY;  Service: Cardiovascular;;   CARDIAC CATHETERIZATION     1995   RIGHT/LEFT HEART CATH AND CORONARY ANGIOGRAPHY N/A 07/21/2021   Procedure: RIGHT/LEFT HEART CATH AND CORONARY ANGIOGRAPHY;  Surgeon: Runell Gess, MD;  Location: [facility] LAB;  Service: Cardiovascular;  Laterality: N/A;   TEE WITHOUT CARDIOVERSION N/A 12/14/2021   Procedure: TRANSESOPHAGEAL ECHOCARDIOGRAM (TEE);  Surgeon: Christell Constant, MD;  Location: [facility] ENDOSCOPY;  Service: Cardiovascular;  Laterality: N/A;   Patient Active Problem List   Diagnosis Date Noted   Severe mitral regurgitation 12/14/2021   Acute respiratory failure with hypoxia (HCC) 07/20/2021   Elevated troponin 07/20/2021   Acute CHF (congestive heart failure) (HCC) 07/20/2021   Pneumonia    Osteoarthritis    MI (myocardial infarction) (HCC)    Hypertension     Goiter    Fibromyalgia    Depression    Coronary artery disease    Arthritis of carpometacarpal [facility]) joint of right thumb 07/15/2018   Pain of right hand 07/15/2018   ARF (acute renal failure) (HCC) 08/20/2014   Chest pain 05/06/2013   Nausea vomiting and diarrhea 05/06/2013   Fever 05/06/2013   HTN (hypertension) 05/06/2013   Hypothyroidism 05/06/2013   Diabetes mellitus (HCC) 05/06/2013    REFERRING DIAG: M17.11 (ICD-10-CM) - Unilateral primary osteoarthritis, right knee M17.12 (ICD-10-CM) - Unilateral primary osteoarthritis, left knee    THERAPY DIAG:  Chronic pain of left knee  Chronic pain of right knee  Muscle weakness (generalized)  Other abnormalities of gait and mobility  Rationale for Evaluation and Treatment Rehabilitation  PERTINENT HISTORY: DM II, HTN, MI   PRECAUTIONS: cardiac  SUBJECTIVE:  SUBJECTIVE STATEMENT:  has not had any knee pain over the past 2-3 days. B calf pain has returned but does not appear to limit her function.    PAIN:  Are you having pain? Yes: NPRS scale: 4/10 Pain location: B knees Pain description: ache Aggravating factors: weight bearing tasks Relieving factors: rest and meds   OBJECTIVE: (objective measures completed at initial evaluation unless otherwise dated)   DIAGNOSTIC FINDINGS:             See imaging   PATIENT SURVEYS:  FOTO: 17% function; 48% predicted; 04/03/22 49%; 04/19/22 57%   COGNITION: Overall cognitive status: Within functional limits for tasks assessed                         SENSATION: WFL   POSTURE: rounded shoulders, forward head, and knee varus   PALPATION: TTP to medial knee   LOWER EXTREMITY ROM:   Active ROM Right eval Left eval R 03/15/22 L 03/15/22 R 04/17/22 L 04/17/22 L 04/19/22  Hip flexion            Hip extension           Hip abduction           Hip adduction           Hip internal rotation           Hip external rotation           Knee flexion 101 84 112d 102d 115d 98d 105d  Knee extension 8 12 -4d -4 -4d -4d   Ankle dorsiflexion           Ankle plantarflexion           Ankle inversion           Ankle eversion            (Blank rows = not tested)   LOWER EXTREMITY MMT:   MMT Right eval Left eval R 04/17/22 L  04/17/22  Hip flexion        Hip extension        Hip abduction 3+/5 3+/5 4/5 4/5  Hip adduction        Hip internal rotation        Hip external rotation        Knee flexion 4/5 4/5 4/5 4/5  Knee extension 4/5 4/5 4/5 4/5  Ankle dorsiflexion        Ankle plantarflexion        Ankle inversion        Ankle eversion         (Blank rows = not tested)   LOWER EXTREMITY SPECIAL TESTS:  DNT   FUNCTIONAL TESTS:  5 times Sit to Stand: 26 seconds; 04/17/22 19s arms crossed   GAIT: Distance walked: 79ft Assistive device utilized: None Level of assistance: SBA Comments: antalgic gait on L, slowed gait speed     TREATMENT: [facility] Adult PT Treatment:                                                DATE: 04/24/22 Therapeutic Exercise: Nustep L5 8 min Heel slides over slide board w/strap 15x (103d flexion measured) S/L clams BluTB 15/15 Runners step 6 in block 12/12 10# KB in opposite hand Bridge with ball 15x 5# ball 16 steps  with B rails using step through pattern Supine hip fallouts BluTB 15x B, 15/15 unilaterally   [facility] Adult PT Treatment:                                                DATE: 04/19/22 Therapeutic Exercise: Nustep L4 8 min Runners step 6 in block 10/10 10# KB in opposite hand Bridge with ball 15x 16 steps with B rails using step through pattern Supine hip fallouts GTB 15x B, 15/15 unilaterally Manual Therapy: Skilled palpation of B gastrocs to identify source of discomfort.  Palpable nodule noted to medial and lateral gastroc  heads. Trigger Point Dry Needling Treatment: Pre-treatment instruction: Patient instructed on dry needling rationale, procedures, and possible side effects including pain during treatment (achy,cramping feeling), bruising, drop of blood, lightheadedness, nausea, sweating. Patient Consent Given: Yes Education handout provided: Yes Muscles treated: B gastrocs  Needle size and number: .25x85mm x 2 Electrical stimulation performed: No Parameters: N/A Treatment response/outcome: Twitch response elicited and Palpable decrease in muscle tension Post-treatment instructions: Patient instructed to expect possible mild to moderate muscle soreness later today and/or tomorrow. Patient instructed in methods to reduce muscle soreness and to continue prescribed HEP. If patient was dry needled over the lung field, patient was instructed on signs and symptoms of pneumothorax and, however unlikely, to see immediate medical attention should they occur. Patient was also educated on signs and symptoms of infection and to seek medical attention should they occur. Patient verbalized understanding of these instructions and education.   [facility] Adult PT Treatment:                                                DATE: 04/17/22 Therapeutic Exercise: Nustep L4 8 min S/L abduction 5# 15x Bil FAQs with adduction 15x 5# Standing ham curl 5# 15x Bil Runners step 6 in block 10/10 5# KB in opposite hand SLR 15x Bil 5# SAQs 5# 15x Bil PF against wall 15x SL with kickstand   [facility] Adult PT Treatment:                                                DATE: 04/10/22 Therapeutic Exercise: Nustep L5 8 min S/L abduction 5# 15x Bil FAQs with adduction 15x 5# Standing ham curl 5# 15x Bil Runners step 6 in block 10/10 SLR 15x Bil 5# SAQs 5# 15x Bil PF against wall 10x SL with kickstand  [facility] Adult PT Treatment:                                                DATE: 04/05/22 Therapeutic Exercise: Nustep L4 8 min S/L abduction 4# 15x  Bil FAQs with adduction 15x 4# Standing ham curl 4# 15x Bil Runners step 4 in block 10/10 SLR 15x Bil 4# SAQs 4# 15x Bil PF against wall 15x   [facility] Adult PT Treatment:  DATE: 04/03/22 Therapeutic Exercise: Nustep L4 8 min S/L abduction 3# 15x Bil FAQs with adduction 15x 3# Seated hamstring curl RTB x15 BIL STS from high table 10x arms crossed SLR 15x Bil 3# SAQs 3# 15x Bil     PATIENT EDUCATION:  Education details: eval findings, FOTO, HEP, POC Person educated: Patient and son Education method: Explanation, Demonstration, and Handouts Education comprehension: verbalized understanding and returned demonstration   HOME EXERCISE PROGRAM: Access Code: [email or web address] URL: [email or web address] Date: 04/03/2022 Prepared by: Gustavus Bryant  Exercises - Active Straight Leg Raise with Quad Set  - 1-2 x daily - 5 x weekly - 2 sets - 15 reps - Supine Heel Slide  - 1-2 x daily - 5 x weekly - 2 sets - 15 reps - 5 sec hold. - Seated Long Arc Quad  - 1-2 x daily - 5 x weekly - 2 sets - 15 reps - Seated Hamstring Curl with Anchored Resistance  - 2 x daily - 5 x weekly - 2 sets - 15 reps - Sit to Stand with Arms Crossed  - 2 x daily - 5 x weekly - 2 sets - 10 reps   ASSESSMENT:   CLINICAL IMPRESSION: Today's session focused on hip strengthening, L knee flexion, functional strengthening and HEP verbal review with instructions given to identify any restrictions in ADLs prior to next visit.  L knee flexion ROM appears to have plateaued    OBJECTIVE IMPAIRMENTS: Abnormal gait, decreased activity tolerance, decreased balance, decreased endurance, decreased mobility, difficulty walking, decreased ROM, decreased strength, and pain   ACTIVITY LIMITATIONS: carrying, lifting, standing, squatting, and transfers   PARTICIPATION LIMITATIONS: cleaning, laundry, driving, shopping, community activity, occupation, and yard work   PERSONAL  FACTORS: Fitness, Time since onset of injury/illness/exacerbation, and 3+ comorbidities: DM II, HTN, MI  are also affecting patient's functional outcome.    REHAB POTENTIAL: Good   CLINICAL DECISION MAKING: Evolving/moderate complexity   EVALUATION COMPLEXITY: Moderate     GOALS: Goals reviewed with patient? No   SHORT TERM GOALS: Target date: 03/06/2022   Pt will be compliant and knowledgeable with initial HEP for improved comfort and carryover Baseline: initial HEP given  Goal status: Met    2.  Pt will self report bilateral knee pain no greater than 6/10 for improved comfort and functional ability Baseline: 10/10 at worst;6/10 worst, 4/10 average Goal status: Met   LONG TERM GOALS: Target date: 04/10/2022   Pt will improve FOTO function score to no less than 48% as proxy for functional improvement Baseline: 47% function; 04/03/22 49% Goal status: Met   2.  Pt will self report bilateral knee pain no greater than 3/10 for improved comfort and functional ability Baseline: 10/10 at worst; 04/17/22 1-2/10 over past 2 days Goal status: Ongoing   3.  Pt will decrease Five Time Sit to Stand to no greater than 20 seconds in order to improve comfort and functional mobility Baseline: 26 seconds; 04/17/22 19s arms crossed Goal status: Met    4.  Pt will improve bilateral knee ROM to at least 5-110 for improved comfort and functional mobility Baseline: see chart Goal status: INITIAL   PLAN:   PT FREQUENCY: 2x/week   PT DURATION: 8 weeks   PLANNED INTERVENTIONS: Therapeutic exercises, Therapeutic activity, Neuromuscular re-education, Balance training, Gait training, Patient/Family education, Self Care, Joint mobilization, Aquatic Therapy, Dry Needling, Electrical stimulation, Cryotherapy, Moist heat, Vasopneumatic device, Manual therapy, and Re-evaluation   PLAN FOR NEXT SESSION: assess HEP response, progress quad strength  and knee ROM, functional mobility      Hildred Laser,  PT 04/24/2022, 5:44 PM

## 2022-04-26 ENCOUNTER — Ambulatory Visit: Payer: 59

## 2022-04-26 DIAGNOSIS — M6281 Muscle weakness (generalized): Secondary | ICD-10-CM

## 2022-04-26 DIAGNOSIS — G8929 Other chronic pain: Secondary | ICD-10-CM

## 2022-04-26 DIAGNOSIS — M25562 Pain in left knee: Secondary | ICD-10-CM | POA: Diagnosis not present

## 2022-04-26 NOTE — Therapy (Signed)
OUTPATIENT PHYSICAL THERAPY TREATMENT NOTE/DC SUMMARY   Patient Name: Stacey Fletcher MRN: [id number] DOB:Oct 28, 1950, 72 y.o., female Today's Date: 04/26/2022  PCP: Quitman Livings, MD  REFERRING PROVIDER: Nadara Mustard, MD  PHYSICAL THERAPY DISCHARGE SUMMARY  Visits from Start of Care: 12  Current functional level related to goals / functional outcomes: Goals met   Remaining deficits: none   Education / Equipment: HEP   Patient agrees to discharge. Patient goals were met. Patient is being discharged due to being pleased with the current functional level.  END OF SESSION:   PT End of Session - 04/26/22 1703     Visit Number 12 (P)     Number of Visits 17 (P)     Date for PT Re-Evaluation 04/10/22 (P)     Authorization Type [employer] Medicare (P)     PT Start Time 1700 (P)     PT Stop Time 1740 (P)     PT Time Calculation (min) 40 min (P)     Activity Tolerance Patient tolerated treatment well (P)     Behavior During Therapy WFL for tasks assessed/performed (P)                 Past Medical History:  Diagnosis Date   Coronary artery disease    Depression    Diabetes mellitus    Fibromyalgia    Goiter    Hypertension    MI (myocardial infarction) (HCC)    Osteoarthritis    Pneumonia    10+ years ago.   Past Surgical History:  Procedure Laterality Date   ABDOMINAL HYSTERECTOMY     BREAST SURGERY     BUBBLE STUDY  12/14/2021   Procedure: BUBBLE STUDY;  Surgeon: Christell Constant, MD;  [facility]: [facility];  Service: Cardiovascular;;   CARDIAC CATHETERIZATION     1995   RIGHT/LEFT HEART CATH AND CORONARY ANGIOGRAPHY N/A 07/21/2021   Procedure: RIGHT/LEFT HEART CATH AND CORONARY ANGIOGRAPHY;  Surgeon: Runell Gess, MD;  Location: [facility] INVASIVE CV LAB;  Service: Cardiovascular;  Laterality: N/A;   TEE WITHOUT CARDIOVERSION N/A 12/14/2021   Procedure: TRANSESOPHAGEAL ECHOCARDIOGRAM (TEE);  Surgeon: Christell Constant, MD;  Location: [facility] ENDOSCOPY;   Service: Cardiovascular;  Laterality: N/A;   Patient Active Problem List   Diagnosis Date Noted   Severe mitral regurgitation 12/14/2021   Acute respiratory failure with hypoxia (HCC) 07/20/2021   Elevated troponin 07/20/2021   Acute CHF (congestive heart failure) (HCC) 07/20/2021   Pneumonia    Osteoarthritis    MI (myocardial infarction) (HCC)    Hypertension    Goiter    Fibromyalgia    Depression    Coronary artery disease    Arthritis of carpometacarpal [facility]) joint of right thumb 07/15/2018   Pain of right hand 07/15/2018   ARF (acute renal failure) (HCC) 08/20/2014   Chest pain 05/06/2013   Nausea vomiting and diarrhea 05/06/2013   Fever 05/06/2013   HTN (hypertension) 05/06/2013   Hypothyroidism 05/06/2013   Diabetes mellitus (HCC) 05/06/2013    REFERRING DIAG: M17.11 (ICD-10-CM) - Unilateral primary osteoarthritis, right knee M17.12 (ICD-10-CM) - Unilateral primary osteoarthritis, left knee    THERAPY DIAG:  Chronic pain of right knee  Chronic pain of left knee  Muscle weakness (generalized)  Rationale for Evaluation and Treatment Rehabilitation  PERTINENT HISTORY: DM II, HTN, MI   PRECAUTIONS: cardiac  SUBJECTIVE:  SUBJECTIVE STATEMENT:  No knee pain to report and cannot identify any limitations to function; she is doing everything she wants    PAIN:  Are you having pain? Yes: NPRS scale: 4/10 Pain location: B knees Pain description: ache Aggravating factors: weight bearing tasks Relieving factors: rest and meds   OBJECTIVE: (objective measures completed at initial evaluation unless otherwise dated)   DIAGNOSTIC FINDINGS:             See imaging   PATIENT SURVEYS:  FOTO: 17% function; 48% predicted; 04/03/22 49%; 04/19/22 57%   COGNITION: Overall cognitive status:  Within functional limits for tasks assessed                         SENSATION: WFL   POSTURE: rounded shoulders, forward head, and knee varus   PALPATION: TTP to medial knee   LOWER EXTREMITY ROM:   Active ROM Right eval Left eval R 03/15/22 L 03/15/22 R 04/17/22 L 04/17/22 L 04/19/22  Hip flexion           Hip extension           Hip abduction           Hip adduction           Hip internal rotation           Hip external rotation           Knee flexion 101 84 112d 102d 115d 98d 105d  Knee extension 8 12 -4d -4 -4d -4d   Ankle dorsiflexion           Ankle plantarflexion           Ankle inversion           Ankle eversion            (Blank rows = not tested)   LOWER EXTREMITY MMT:   MMT Right eval Left eval R 04/17/22 L  04/17/22  Hip flexion        Hip extension        Hip abduction 3+/5 3+/5 4/5 4/5  Hip adduction        Hip internal rotation        Hip external rotation        Knee flexion 4/5 4/5 4/5 4/5  Knee extension 4/5 4/5 4/5 4/5  Ankle dorsiflexion        Ankle plantarflexion        Ankle inversion        Ankle eversion         (Blank rows = not tested)   LOWER EXTREMITY SPECIAL TESTS:  DNT   FUNCTIONAL TESTS:  5 times Sit to Stand: 26 seconds; 04/17/22 19s arms crossed   GAIT: Distance walked: 28ft Assistive device utilized: None Level of assistance: SBA Comments: antalgic gait on L, slowed gait speed     TREATMENT: [facility] Adult PT Treatment:                                                DATE: 04/26/22 Therapeutic Exercise: Nustep L5 8 min Heel slides over slide board w/strap 15x (102d flexion measured) S/L clams BluTB 15/15 Runners step 6 in block 12/12 10# KB in opposite hand Bridge with ball 15x 5# ball FAQs with 5# ball squeeze Supine hip fallouts  BluTB 15x B, 15/15 unilaterally  [facility] Adult PT Treatment:                                                DATE: 04/24/22 Therapeutic Exercise: Nustep L5 8 min Heel slides over slide board  w/strap 15x (103d flexion measured) S/L clams BluTB 15/15 Runners step 6 in block 12/12 10# KB in opposite hand Bridge with ball 15x 5# ball 16 steps with B rails using step through pattern Supine hip fallouts BluTB 15x B, 15/15 unilaterally   [facility] Adult PT Treatment:                                                DATE: 04/19/22 Therapeutic Exercise: Nustep L4 8 min Runners step 6 in block 10/10 10# KB in opposite hand Bridge with ball 15x 16 steps with B rails using step through pattern Supine hip fallouts GTB 15x B, 15/15 unilaterally Manual Therapy: Skilled palpation of B gastrocs to identify source of discomfort.  Palpable nodule noted to medial and lateral gastroc heads. Trigger Point Dry Needling Treatment: Pre-treatment instruction: Patient instructed on dry needling rationale, procedures, and possible side effects including pain during treatment (achy,cramping feeling), bruising, drop of blood, lightheadedness, nausea, sweating. Patient Consent Given: Yes Education handout provided: Yes Muscles treated: B gastrocs  Needle size and number: .25x34mm x 2 Electrical stimulation performed: No Parameters: N/A Treatment response/outcome: Twitch response elicited and Palpable decrease in muscle tension Post-treatment instructions: Patient instructed to expect possible mild to moderate muscle soreness later today and/or tomorrow. Patient instructed in methods to reduce muscle soreness and to continue prescribed HEP. If patient was dry needled over the lung field, patient was instructed on signs and symptoms of pneumothorax and, however unlikely, to see immediate medical attention should they occur. Patient was also educated on signs and symptoms of infection and to seek medical attention should they occur. Patient verbalized understanding of these instructions and education.       PATIENT EDUCATION:  Education details: eval findings, FOTO, HEP, POC Person educated: Patient and  son Education method: Explanation, Demonstration, and Handouts Education comprehension: verbalized understanding and returned demonstration   HOME EXERCISE PROGRAM: Access Code: [email or web address] URL: [email or web address] Date: 04/03/2022 Prepared by: Gustavus Bryant  Exercises - Active Straight Leg Raise with Quad Set  - 1-2 x daily - 5 x weekly - 2 sets - 15 reps - Supine Heel Slide  - 1-2 x daily - 5 x weekly - 2 sets - 15 reps - 5 sec hold. - Seated Long Arc Quad  - 1-2 x daily - 5 x weekly - 2 sets - 15 reps - Seated Hamstring Curl with Anchored Resistance  - 2 x daily - 5 x weekly - 2 sets - 15 reps - Sit to Stand with Arms Crossed  - 2 x daily - 5 x weekly - 2 sets - 10 reps   ASSESSMENT:   CLINICAL IMPRESSION: All rehab goals met, no discernable pain reported over the past several sessions.    OBJECTIVE IMPAIRMENTS: Abnormal gait, decreased activity tolerance, decreased balance, decreased endurance, decreased mobility, difficulty walking, decreased ROM, decreased strength, and pain   ACTIVITY LIMITATIONS: carrying, lifting, standing, squatting, and transfers  PARTICIPATION LIMITATIONS: cleaning, laundry, driving, shopping, community activity, occupation, and yard work   PERSONAL FACTORS: Fitness, Time since onset of injury/illness/exacerbation, and 3+ comorbidities: DM II, HTN, MI  are also affecting patient's functional outcome.    REHAB POTENTIAL: Good   CLINICAL DECISION MAKING: Evolving/moderate complexity   EVALUATION COMPLEXITY: Moderate     GOALS: Goals reviewed with patient? No   SHORT TERM GOALS: Target date: 03/06/2022   Pt will be compliant and knowledgeable with initial HEP for improved comfort and carryover Baseline: initial HEP given  Goal status: Met    2.  Pt will self report bilateral knee pain no greater than 6/10 for improved comfort and functional ability Baseline: 10/10 at worst;6/10 worst, 4/10 average Goal status: Met   LONG TERM  GOALS: Target date: 04/10/2022   Pt will improve FOTO function score to no less than 48% as proxy for functional improvement Baseline: 47% function; 04/03/22 49% Goal status: Met   2.  Pt will self report bilateral knee pain no greater than 3/10 for improved comfort and functional ability Baseline: 10/10 at worst; 04/17/22 1-2/10 over past 2 days Goal status: Met   3.  Pt will decrease Five Time Sit to Stand to no greater than 20 seconds in order to improve comfort and functional mobility Baseline: 26 seconds; 04/17/22 19s arms crossed Goal status: Met    4.  Pt will improve bilateral knee ROM to at least 5-110 for improved comfort and functional mobility Baseline:  L 03/15/22 R 04/17/22 L 04/17/22 L 04/19/22                                102d 115d 98d 105d  -4 -4d -4d                        Goal status: ROM maximized due to underlying degenerative changes   PLAN:   PT FREQUENCY: 2x/week   PT DURATION: 8 weeks   PLANNED INTERVENTIONS: Therapeutic exercises, Therapeutic activity, Neuromuscular re-education, Balance training, Gait training, Patient/Family education, Self Care, Joint mobilization, Aquatic Therapy, Dry Needling, Electrical stimulation, Cryotherapy, Moist heat, Vasopneumatic device, Manual therapy, and Re-evaluation   PLAN FOR NEXT SESSION: DC to HEP     Hildred Laser, PT 04/26/2022, 5:04 PM

## 2022-04-30 ENCOUNTER — Ambulatory Visit (INDEPENDENT_AMBULATORY_CARE_PROVIDER_SITE_OTHER): Payer: 59 | Admitting: Orthopedic Surgery

## 2022-04-30 DIAGNOSIS — M1712 Unilateral primary osteoarthritis, left knee: Secondary | ICD-10-CM | POA: Diagnosis not present

## 2022-04-30 DIAGNOSIS — M1711 Unilateral primary osteoarthritis, right knee: Secondary | ICD-10-CM

## 2022-05-01 ENCOUNTER — Ambulatory Visit: Payer: 59

## 2022-05-03 ENCOUNTER — Encounter (HOSPITAL_COMMUNITY): Payer: Self-pay | Admitting: *Deleted

## 2022-05-03 ENCOUNTER — Ambulatory Visit: Payer: 59

## 2022-05-03 NOTE — Progress Notes (Signed)
Patient referred for exercise programming per discussion with Dr. Daniel Nones after CPX.   Patient was presented with an information packet via email on 04/09/22 and again on 05/03/22 including options for exercise programs, either [facility] [employer] program or Right Start program at [facility] at [place] location. Follow-up call on 05/03/22 to confirm receipt of information and inquiring about her decision and next steps. All patient's questions were answered and patient was given contact information for further questions or concerns regarding their exercise.      Kathy Breach, MS, ACSM, [facility] Clinical Exercise Physiologist/ Health and Wellness Coach

## 2022-05-13 ENCOUNTER — Encounter: Payer: Self-pay | Admitting: Orthopedic Surgery

## 2022-05-13 DIAGNOSIS — M1711 Unilateral primary osteoarthritis, right knee: Secondary | ICD-10-CM | POA: Diagnosis not present

## 2022-05-13 NOTE — Progress Notes (Signed)
Office Visit Note   Patient: Stacey Fletcher           Date of Birth: [birth year]           MRN: [id number] Visit Date: 04/30/2022              Requested by: Quitman Livings, MD [address], [address] [place],  [place] [address] PCP: Quitman Livings, MD  Chief Complaint  Patient presents with   Left Knee - Pain    S/p bilateral cortisone injections 02/22/2022   Right Knee - Pain      HPI: Patient is a 72 year old woman who presents with osteoarthritis bilateral knees.  Last injection was 2 months ago.  Patient states she received good relief from the previous injection.  Assessment & Plan: Visit Diagnoses:  1. Unilateral primary osteoarthritis, right knee   2. Unilateral primary osteoarthritis, left knee     Plan: Both knees were injected she tolerated this well patient states that she is currently undergoing a cardiac workup.  Follow-Up Instructions: No follow-ups on file.   Ortho Exam  Patient is alert, oriented, no adenopathy, well-dressed, normal affect, normal respiratory effort. Examination patient has mild effusion of both knees collaterals and cruciates are stable there is crepitation with range of motion.  Imaging: No results found. No images are attached to the encounter.  Labs: Lab Results  Component Value Date   HGBA1C 6.7 (H) 07/20/2021   HGBA1C 6.8 (H) 05/02/2020   ESRSEDRATE 13 08/03/2009   REPTSTATUS 05/11/2013 FINAL 05/07/2013   CULT  05/07/2013    NO SALMONELLA, SHIGELLA, CAMPYLOBACTER, YERSINIA, OR E.COLI 0157:[id number] ISOLATED Performed at [facility] ESCHERICHIA COLI 05/06/2013     Lab Results  Component Value Date   ALBUMIN 3.8 08/10/2021   ALBUMIN 3.7 05/02/2020   ALBUMIN 4.3 08/05/2016    Lab Results  Component Value Date   MG 1.9 07/20/2021   MG 1.7 05/06/2013   MG 1.9 10/20/2011   Lab Results  Component Value Date   VD25OH 30 03/01/2009    No results found for: "PREALBUMIN"    Latest Ref Rng & Units 07/23/2021     2:31 AM 07/22/2021   12:57 AM 07/21/2021    2:21 PM  CBC EXTENDED  WBC 4.0 - 10.5 K/uL 8.6  7.7    RBC 3.87 - 5.11 MIL/uL 4.93  4.51    Hemoglobin 12.0 - 15.0 g/dL 19.1  47.8  29.5   HCT 36.0 - 46.0 % 39.7  36.3  36.0   Platelets 150 - 400 K/uL 241  223       There is no height or weight on file to calculate BMI.  Orders:  No orders of the defined types were placed in this encounter.  No orders of the defined types were placed in this encounter.    Procedures: Large Joint Inj: bilateral knee on 05/13/2022 4:40 PM Indications: pain and diagnostic evaluation Details: 22 G 1.5 in needle, anteromedial approach  Arthrogram: No  Outcome: tolerated well, no immediate complications Procedure, treatment alternatives, risks and benefits explained, specific risks discussed. Consent was given by the patient. Immediately prior to procedure a time out was called to verify the correct patient, procedure, equipment, support staff and site/side marked as required. Patient was prepped and draped in the usual sterile fashion.      Clinical Data: No additional findings.  ROS:  All other systems negative, except as noted in the HPI. Review of Systems  Objective:  Vital Signs: There were no vitals taken for this visit.  Specialty Comments:  No specialty comments available.  PMFS History: Patient Active Problem List   Diagnosis Date Noted   Severe mitral regurgitation 12/14/2021   Acute respiratory failure with hypoxia 07/20/2021   Elevated troponin 07/20/2021   Acute CHF (congestive heart failure) 07/20/2021   Pneumonia    Osteoarthritis    MI (myocardial infarction)    Hypertension    Goiter    Fibromyalgia    Depression    Coronary artery disease    Arthritis of carpometacarpal (CMC) joint of right thumb 07/15/2018   Pain of right hand 07/15/2018   ARF (acute renal failure) 08/20/2014   Chest pain 05/06/2013   Nausea vomiting and diarrhea 05/06/2013   Fever 05/06/2013   HTN  (hypertension) 05/06/2013   Hypothyroidism 05/06/2013   Diabetes mellitus 05/06/2013   Past Medical History:  Diagnosis Date   Coronary artery disease    Depression    Diabetes mellitus    Fibromyalgia    Goiter    Hypertension    MI (myocardial infarction)    Osteoarthritis    Pneumonia    10+ years ago.    Family History  Problem Relation Age of Onset   Hypertension Mother    Diabetes Mother    Stroke Mother    Hypertension Other    Diabetes Other     Past Surgical History:  Procedure Laterality Date   ABDOMINAL HYSTERECTOMY     BREAST SURGERY     BUBBLE STUDY  12/14/2021   Procedure: BUBBLE STUDY;  Surgeon: Christell Constant, MD;  [facility]: [facility]OS[facility];  Service: Cardiovascular;;   CARDIAC CATHETERIZATION     1995   RIGHT/LEFT HEART CATH AND CORONARY ANGIOGRAPHY N/A 07/21/2021   Procedure: RIGHT/LEFT HEART CATH AND CORONARY ANGIOGRAPHY;  Surgeon: Runell Gess, MD;  [facility] INVASIVE CV LAB;  Service: Cardiovascular;  Laterality: N/A;   TEE WITHOUT CARDIOVERSION N/A 12/14/2021   Procedure: TRANSESOPHAGEAL ECHOCARDIOGRAM (TEE);  Surgeon: Christell Constant, MD;  Location[facility] ENDOSCOPY;  Service: Cardiovascular;  Laterality: N/A;   Social History   Occupational History   Occupation: retired  Tobacco Use   Smoking status: Former    Types: Cigarettes    Quit date: 07/07/2018    Years since quitting: 3.8   Smokeless tobacco: Never  Vaping Use   Vaping Use: Never used  Substance and Sexual Activity   Alcohol use: No   Drug use: No   Sexual activity: Not Currently

## 2022-05-14 ENCOUNTER — Other Ambulatory Visit: Payer: Self-pay | Admitting: Internal Medicine

## 2022-05-14 DIAGNOSIS — Z0000 Encounter for general adult medical examination without abnormal findings: Secondary | ICD-10-CM

## 2022-05-23 ENCOUNTER — Ambulatory Visit
Admission: RE | Admit: 2022-05-23 | Discharge: 2022-05-23 | Disposition: A | Discharge status: Home / Self Care | Payer: 59 | Source: Ambulatory Visit | Attending: Internal Medicine | Admitting: Internal Medicine

## 2022-05-23 DIAGNOSIS — Z0000 Encounter for general adult medical examination without abnormal findings: Secondary | ICD-10-CM

## 2022-05-25 ENCOUNTER — Other Ambulatory Visit: Payer: Self-pay | Admitting: Internal Medicine

## 2022-05-25 DIAGNOSIS — R928 Other abnormal and inconclusive findings on diagnostic imaging of breast: Secondary | ICD-10-CM

## 2022-05-28 ENCOUNTER — Ambulatory Visit (HOSPITAL_COMMUNITY)
Admission: RE | Admit: 2022-05-28 | Discharge: 2022-05-28 | Disposition: A | Discharge status: Home / Self Care | Payer: 59 | Source: Ambulatory Visit | Attending: Cardiology | Admitting: Cardiology

## 2022-05-28 VITALS — BP 146/68 | HR 79 | Wt 212.0 lb

## 2022-05-28 DIAGNOSIS — E119 Type 2 diabetes mellitus without complications: Secondary | ICD-10-CM | POA: Insufficient documentation

## 2022-05-28 DIAGNOSIS — I110 Hypertensive heart disease with heart failure: Secondary | ICD-10-CM | POA: Insufficient documentation

## 2022-05-28 DIAGNOSIS — F4310 Post-traumatic stress disorder, unspecified: Secondary | ICD-10-CM | POA: Diagnosis not present

## 2022-05-28 DIAGNOSIS — I340 Nonrheumatic mitral (valve) insufficiency: Secondary | ICD-10-CM | POA: Insufficient documentation

## 2022-05-28 DIAGNOSIS — I5020 Unspecified systolic (congestive) heart failure: Secondary | ICD-10-CM | POA: Diagnosis present

## 2022-05-28 DIAGNOSIS — I5041 Acute combined systolic (congestive) and diastolic (congestive) heart failure: Secondary | ICD-10-CM

## 2022-05-28 LAB — DIGOXIN LEVEL: Digoxin Level: 0.7 ng/mL — ABNORMAL LOW (ref 0.8–2.0)

## 2022-05-28 MED ORDER — LOSARTAN POTASSIUM 100 MG PO TABS: 150.0000 mg | ORAL_TABLET | Freq: Every day | ORAL | 6 refills | Status: DC

## 2022-05-28 NOTE — Progress Notes (Signed)
Advanced Heart Failure Clinic Note  Referring Physician: Quitman Livings, MD  Primary Care: Quitman Livings, MD Primary HF: Dr. Gasper Lloyd  HPI:  Stacey Fletcher is a 72 y.o. female with HTN, T2DM, PTSD from son's death and diagnosis of HFrEF made last summer. In June of 2023, she reports waking up in her sleep with severe respiratory distress after a few weeks of worsening DOE. She was admitted to [facility] where she was found to have an LVEF of 30-35%; she was diuresed with IV Lasix and discharged on low dose GDMT. She had a subsequent TTE in September 2023 w/ worsening LV function (LVEF 20-25%) and moderate to severe MR. Since that time she has also undergone TEE confirming presence of moderate to severe MR for which she has seen structural cardiology. Otherwise, Stacey Fletcher attempts to be as active as possible. She feels very limited by right knee osteoarthritis for which she is seeing orthopedic surgery. She is originally from [place] but moved to [place] after the murder of her son. She has excellent support here from her 2nd son.    She returned to [facility] on 04/17/22. She reported some CP/pressure. Denied lightheadedness, dizziness, palpitations, or LEE. Knee pain had improved; able to walk longer durations and stand while cooking. Denied SOB, but had not herself recently pushed herself. She had been completing physical therapy.   Today she returns to HF clinic for pharmacist medication titration. At last visit with MD, losartan was increased to 100 mg daily. Overall feeling much better. Denies dizziness, lightheadedness, fatigue, chest pain, or palpitations. She did not take her BP medications today. She reports that this was a one-off occurrence. She reports medication adherence otherwise. Took all of her HF medications aside from digoxin today in clinic. She reports that her home BP has been around 130s systolic over the past month. Denies any SOB. She has started mowing her lawn without any  exertional dyspnea using a push mower. Weight at home 210-212 pounds. Takes furosemide 40 mg every other day. No extra doses required this month. Trace LEE on exam. Denies PND/Orthopnea. States she uses 2 pillows for comfort. Appetite is still low. Following a low-salt diet.   HF Medications: Metoprolol succinate 100 mg daily Losartan 100 mg daily Spironolactone 25 mg daily Farxiga 10 mg daily Digoxin 0.125 mg daily Furosemide 40 mg every other day Potassium chloride 20 mEq every other day  Has the patient been experiencing any side effects to the medications prescribed?  No  Does the patient have any problems obtaining medications due to transportation or finances?   [employer] Dual Complete Medicare  Understanding of regimen: good Understanding of indications: good Potential of compliance: good Patient understands to avoid NSAIDs. Patient understands to avoid decongestants.    Pertinent Lab Values: 04/17/22: Serum creatinine 0.94, BUN 15, Potassium 4.2, Sodium 140, BNP 142.8 Digoxin lab pending   Vital Signs: Weight: 212 lbs (last clinic weight: 216 lbs) Blood pressure: 146/68 mmHg Heart rate: 79 bpm  Assessment/Plan: 1. Heart failure with reduced EF Etiology of WU:[id number]; Planning to evaluate for sarcoid with cardiac PET.  NYHA class / AHA Stage: Significant improvement after uptitrating medical therapy and starting physical therapy. Now NYHA II symptoms. Volume status & Diuretics: Trace edema on exam. Continue furosemide 40 mg every other day and potassium chloride 20 mEq every other day Vasodilators: Increase losartan to 150 mg daily.  Beta-Blocker:Continue metoprolol succinate 100 mg daily MRA: Continue spironolactone 25 mg daily Cardiometabolic: Continue Farxiga 10 mg  daily Other: Continue digoxin 125 mcg daily. Digoxin level is 0.7 today. Devices therapies & Valvulopathies:EP referral previously placed for primary prevention ICD Advanced therapies: When we  first started managing Stacey Fletcher she was unable to walk more than 15-20 ft and had a fairly poor baseline functional status. We have slowly uptitrated medications and started physical therapy. She is now able to work around the home and mow her yard. She had a CPX that does not demonstrate significant cardiac limitations at this point. Will plan for cardiac PET in the near future per Dr. Gasper Lloyd   2. Moderate Mitral regurgitation  - Continue GDMT   3. HTN - See management above  Follow up in 3 months with Dr. Blenda Mounts, PharmD PGY-1 [facility] Pharmacy Resident  Karle Plumber, PharmD, BCPS, [facility], CPP Heart Failure Clinic Pharmacist [phone]

## 2022-05-28 NOTE — Patient Instructions (Addendum)
It was a pleasure seeing you today!  MEDICATIONS: -We are changing your medications today -Increase losartan to 150 mg (one and a half tablets) once daily -Call if you have questions about your medications.  LABS: -We will call you if your labs need attention.  NEXT APPOINTMENT: Return to clinic in 3 months with Dr. Gasper Lloyd. Be sure to call the clinic in a few months to get this scheduled.   In general, to take care of your heart failure: -Limit your fluid intake to 2 Liters (half-gallon) per day.   -Limit your salt intake to ideally 2-3 grams (2000-3000 mg) per day. -Weigh yourself daily and record, and bring that "weight diary" to your next appointment.  (Weight gain of 2-3 pounds in 1 day typically means fluid weight.) -The medications for your heart are to help your heart and help you live longer.   -Please contact us before stopping any of your heart medications.  Call the clinic at [phone] with questions or to reschedule future appointments.

## 2022-06-02 ENCOUNTER — Inpatient Hospital Stay: Admission: RE | Admit: 2022-06-02 | Payer: 59 | Source: Ambulatory Visit

## 2022-06-06 ENCOUNTER — Other Ambulatory Visit (HOSPITAL_COMMUNITY): Payer: Self-pay

## 2022-06-07 ENCOUNTER — Other Ambulatory Visit (HOSPITAL_COMMUNITY): Payer: Self-pay

## 2022-06-07 MED ORDER — LOSARTAN POTASSIUM 100 MG PO TABS: 100.0000 mg | ORAL_TABLET | Freq: Every day | ORAL | 3 refills | Status: DC

## 2022-06-07 MED ORDER — LOSARTAN POTASSIUM 50 MG PO TABS: 50.0000 mg | ORAL_TABLET | Freq: Every day | ORAL | 3 refills | Status: DC

## 2022-06-19 ENCOUNTER — Other Ambulatory Visit (HOSPITAL_COMMUNITY): Payer: Self-pay

## 2022-06-19 ENCOUNTER — Other Ambulatory Visit: Payer: 59

## 2022-06-22 ENCOUNTER — Ambulatory Visit: Payer: 59

## 2022-06-27 ENCOUNTER — Ambulatory Visit
Admission: RE | Admit: 2022-06-27 | Discharge: 2022-06-27 | Disposition: A | Discharge status: Home / Self Care | Payer: 59 | Source: Ambulatory Visit | Attending: Internal Medicine | Admitting: Internal Medicine

## 2022-06-27 ENCOUNTER — Other Ambulatory Visit: Payer: Self-pay | Admitting: Internal Medicine

## 2022-06-27 DIAGNOSIS — N6310 Unspecified lump in the right breast, unspecified quadrant: Secondary | ICD-10-CM

## 2022-06-27 DIAGNOSIS — R928 Other abnormal and inconclusive findings on diagnostic imaging of breast: Secondary | ICD-10-CM

## 2022-07-09 ENCOUNTER — Encounter: Payer: Self-pay | Admitting: Orthopedic Surgery

## 2022-07-09 ENCOUNTER — Ambulatory Visit (INDEPENDENT_AMBULATORY_CARE_PROVIDER_SITE_OTHER): Payer: 59 | Admitting: Orthopedic Surgery

## 2022-07-09 DIAGNOSIS — M170 Bilateral primary osteoarthritis of knee: Secondary | ICD-10-CM | POA: Diagnosis not present

## 2022-07-09 NOTE — Progress Notes (Unsigned)
Office Visit Note   Patient: Stacey Fletcher           Date of Birth: 04/15/1950           MRN: [id number] Visit Date: 07/09/2022              Requested by: Quitman Livings, MD [address] [place],  [place] [address] PCP: Quitman Livings, MD  Chief Complaint  Patient presents with   Right Knee - Follow-up   Left Knee - Follow-up      HPI: Patient is a 72 year old woman who is seen in follow-up for bilateral knee arthritis.  Patient states she has had good relief from previous injections.  Assessment & Plan: Visit Diagnoses:  1. Bilateral primary osteoarthritis of knee     Plan: Both knees were injected she tolerated this well follow-up as needed.  Follow-Up Instructions: Return if symptoms worsen or fail to improve.   Ortho Exam  Patient is alert, oriented, no adenopathy, well-dressed, normal affect, normal respiratory effort. Patient has an antalgic gait she has crepitation with range of motion of both knees she has a mild effusion in both knees worse on the left than the right.  Collaterals and cruciates are stable.  Imaging: No results found. No images are attached to the encounter.  Labs: Lab Results  Component Value Date   HGBA1C 6.7 (H) 07/20/2021   HGBA1C 6.8 (H) 05/02/2020   ESRSEDRATE 13 08/03/2009   REPTSTATUS 05/11/2013 FINAL 05/07/2013   CULT  05/07/2013    NO SALMONELLA, SHIGELLA, CAMPYLOBACTER, YERSINIA, OR E.COLI 0157:H7 ISOLATED Performed at [employer] [facility] ESCHERICHIA COLI 05/06/2013     Lab Results  Component Value Date   ALBUMIN 3.8 08/10/2021   ALBUMIN 3.7 05/02/2020   ALBUMIN 4.3 08/05/2016    Lab Results  Component Value Date   MG 1.9 07/20/2021   MG 1.7 05/06/2013   MG 1.9 10/20/2011   Lab Results  Component Value Date   VD25OH 30 03/01/2009    No results found for: "PREALBUMIN"    Latest Ref Rng & Units 07/23/2021    2:31 AM 07/22/2021   12:57 AM 07/21/2021    2:21 PM  CBC EXTENDED  WBC 4.0 - 10.5  K/uL 8.6  7.7    RBC 3.87 - 5.11 MIL/uL 4.93  4.51    Hemoglobin 12.0 - 15.0 g/dL 30.8  65.7  84.6   HCT 36.0 - 46.0 % 39.7  36.3  36.0   Platelets 150 - 400 K/uL 241  223       There is no height or weight on file to calculate BMI.  Orders:  No orders of the defined types were placed in this encounter.  No orders of the defined types were placed in this encounter.    Procedures: Large Joint Inj: bilateral knee on 07/09/2022 4:32 PM Indications: pain and diagnostic evaluation Details: 22 G 1.5 in needle, anteromedial approach  Arthrogram: No  Outcome: tolerated well, no immediate complications Procedure, treatment alternatives, risks and benefits explained, specific risks discussed. Consent was given by the patient. Immediately prior to procedure a time out was called to verify the correct patient, procedure, equipment, support staff and site/side marked as required. Patient was prepped and draped in the usual sterile fashion.      Clinical Data: No additional findings.  ROS:  All other systems negative, except as noted in the HPI. Review of Systems  Objective: Vital Signs: There were no vitals taken  for this visit.  Specialty Comments:  No specialty comments available.  PMFS History: Patient Active Problem List   Diagnosis Date Noted   Severe mitral regurgitation 12/14/2021   Acute respiratory failure with hypoxia (HCC) 07/20/2021   Elevated troponin 07/20/2021   Acute CHF (congestive heart failure) (HCC) 07/20/2021   Pneumonia    Osteoarthritis    MI (myocardial infarction) (HCC)    Hypertension    Goiter    Fibromyalgia    Depression    Coronary artery disease    Arthritis of carpometacarpal (CMC) joint of right thumb 07/15/2018   Pain of right hand 07/15/2018   ARF (acute renal failure) (HCC) 08/20/2014   Chest pain 05/06/2013   Nausea vomiting and diarrhea 05/06/2013   Fever 05/06/2013   HTN (hypertension) 05/06/2013   Hypothyroidism 05/06/2013    Diabetes mellitus (HCC) 05/06/2013   Past Medical History:  Diagnosis Date   Coronary artery disease    Depression    Diabetes mellitus    Fibromyalgia    Goiter    Hypertension    MI (myocardial infarction) (HCC)    Osteoarthritis    Pneumonia    10+ years ago.    Family History  Problem Relation Age of Onset   Hypertension Mother    Diabetes Mother    Stroke Mother    Hypertension Other    Diabetes Other     Past Surgical History:  Procedure Laterality Date   ABDOMINAL HYSTERECTOMY     BREAST SURGERY     BUBBLE STUDY  12/14/2021   Procedure: BUBBLE STUDY;  Surgeon: Christell Constant, MD;  Location: [facility] ENDOSCOPY;  Service: Cardiovascular;;   CARDIAC CATHETERIZATION     1995   REDUCTION MAMMAPLASTY Bilateral    RIGHT/LEFT HEART CATH AND CORONARY ANGIOGRAPHY N/A 07/21/2021   Procedure: RIGHT/LEFT HEART CATH AND CORONARY ANGIOGRAPHY;  Surgeon: Runell Gess, MD;  Location: [facility] INVASIVE CV LAB;  Service: Cardiovascular;  Laterality: N/A;   TEE WITHOUT CARDIOVERSION N/A 12/14/2021   Procedure: TRANSESOPHAGEAL ECHOCARDIOGRAM (TEE);  Surgeon: Christell Constant, MD;  Location: [facility] ENDOSCOPY;  Service: Cardiovascular;  Laterality: N/A;   Social History   Occupational History   Occupation: retired  Tobacco Use   Smoking status: Former    Types: Cigarettes    Quit date: 07/07/2018    Years since quitting: 4.0   Smokeless tobacco: Never  Vaping Use   Vaping Use: Never used  Substance and Sexual Activity   Alcohol use: No   Drug use: No   Sexual activity: Not Currently

## 2022-07-13 ENCOUNTER — Ambulatory Visit (HOSPITAL_COMMUNITY)
Admission: RE | Admit: 2022-07-13 | Discharge: 2022-07-13 | Disposition: A | Discharge status: Home / Self Care | Payer: 59 | Source: Ambulatory Visit | Attending: Cardiology | Admitting: Cardiology

## 2022-07-13 ENCOUNTER — Encounter (HOSPITAL_COMMUNITY): Payer: Self-pay | Admitting: Cardiology

## 2022-07-13 ENCOUNTER — Encounter (HOSPITAL_COMMUNITY): Payer: 59 | Admitting: Cardiology

## 2022-07-13 VITALS — BP 118/70 | HR 84

## 2022-07-13 DIAGNOSIS — M1711 Unilateral primary osteoarthritis, right knee: Secondary | ICD-10-CM | POA: Insufficient documentation

## 2022-07-13 DIAGNOSIS — E1169 Type 2 diabetes mellitus with other specified complication: Secondary | ICD-10-CM | POA: Diagnosis not present

## 2022-07-13 DIAGNOSIS — I110 Hypertensive heart disease with heart failure: Secondary | ICD-10-CM | POA: Insufficient documentation

## 2022-07-13 DIAGNOSIS — I5020 Unspecified systolic (congestive) heart failure: Secondary | ICD-10-CM | POA: Insufficient documentation

## 2022-07-13 DIAGNOSIS — I340 Nonrheumatic mitral (valve) insufficiency: Secondary | ICD-10-CM | POA: Insufficient documentation

## 2022-07-13 DIAGNOSIS — Z8249 Family history of ischemic heart disease and other diseases of the circulatory system: Secondary | ICD-10-CM | POA: Insufficient documentation

## 2022-07-13 DIAGNOSIS — Z79899 Other long term (current) drug therapy: Secondary | ICD-10-CM | POA: Diagnosis not present

## 2022-07-13 DIAGNOSIS — E1159 Type 2 diabetes mellitus with other circulatory complications: Secondary | ICD-10-CM

## 2022-07-13 DIAGNOSIS — E785 Hyperlipidemia, unspecified: Secondary | ICD-10-CM

## 2022-07-13 DIAGNOSIS — Z7984 Long term (current) use of oral hypoglycemic drugs: Secondary | ICD-10-CM | POA: Diagnosis not present

## 2022-07-13 DIAGNOSIS — Z87891 Personal history of nicotine dependence: Secondary | ICD-10-CM | POA: Insufficient documentation

## 2022-07-13 DIAGNOSIS — E119 Type 2 diabetes mellitus without complications: Secondary | ICD-10-CM | POA: Insufficient documentation

## 2022-07-13 DIAGNOSIS — F4310 Post-traumatic stress disorder, unspecified: Secondary | ICD-10-CM | POA: Diagnosis not present

## 2022-07-13 DIAGNOSIS — Z794 Long term (current) use of insulin: Secondary | ICD-10-CM

## 2022-07-13 DIAGNOSIS — I152 Hypertension secondary to endocrine disorders: Secondary | ICD-10-CM

## 2022-07-13 LAB — BRAIN NATRIURETIC PEPTIDE: B Natriuretic Peptide: 60.6 pg/mL (ref 0.0–100.0)

## 2022-07-13 LAB — DIGOXIN LEVEL: Digoxin Level: 0.4 ng/mL — ABNORMAL LOW (ref 0.8–2.0)

## 2022-07-13 LAB — BASIC METABOLIC PANEL
Anion gap: 11 (ref 5–15)
BUN: 21 mg/dL (ref 8–23)
CO2: 27 mmol/L (ref 22–32)
Calcium: 10.1 mg/dL (ref 8.9–10.3)
Chloride: 101 mmol/L (ref 98–111)
Creatinine, Ser: 0.96 mg/dL (ref 0.44–1.00)
GFR, Estimated: >60 mL/min (ref >60)
Glucose, Bld: 119 mg/dL — ABNORMAL HIGH (ref 70–99)
Potassium: 4.1 mmol/L (ref 3.5–5.1)
Sodium: 139 mmol/L (ref 135–145)

## 2022-07-13 MED ORDER — DIGOXIN 125 MCG PO TABS: 0.1250 mg | ORAL_TABLET | Freq: Every day | ORAL | 3 refills | Status: DC

## 2022-07-13 NOTE — Patient Instructions (Signed)
There has been no changes to your medications.  Labs done today, your results will be available in MyChart, we will contact you for abnormal readings.  Your physician has requested that you have an echocardiogram. Echocardiography is a painless test that uses sound waves to create images of your heart. It provides your doctor with information about the size and shape of your heart and how well your heart's chambers and valves are working. This procedure takes approximately one hour. There are no restrictions for this procedure. Please do NOT wear cologne, perfume, aftershave, or lotions (deodorant is allowed). Please arrive 15 minutes prior to your appointment time.  You have been referred to Cardiac Rehab. They will call you to arrange your appointment.  Your physician recommends that you schedule a follow-up appointment in: 4 months ( October) ** please call the office in August to arrange your follow up appointment. **  If you have any questions or concerns before your next appointment please send [place] a message through Chelsea or call our office at [phone].    TO LEAVE A MESSAGE FOR THE NURSE SELECT OPTION 2, PLEASE LEAVE A MESSAGE INCLUDING: YOUR NAME DATE OF BIRTH CALL BACK NUMBER REASON FOR CALL**this is important as we prioritize the call backs  YOU WILL RECEIVE A CALL BACK THE SAME DAY AS LONG AS YOU CALL BEFORE 4:00 PM  At the [facility], you and your health needs are our priority. As part of our continuing mission to provide you with exceptional heart care, we have created designated Provider Care Teams. These Care Teams include your primary Cardiologist (physician) and Advanced Practice Providers (APPs- Physician Assistants and Nurse Practitioners) who all work together to provide you with the care you need, when you need it.   You may see any of the following providers on your designated Care Team at your next follow up: Dr Arvilla Meres Dr Marca Ancona Dr. Marcos Eke, NP Robbie Lis, [place] [facility] [place], [place] Brynda Peon, NP Karle Plumber, PharmD   Please be sure to bring in all your medications bottles to every appointment.    Thank you for choosing [facility]

## 2022-07-13 NOTE — Progress Notes (Signed)
[facility] NOTE  Referring Physician: Quitman Livings, MD  Primary Care: Quitman Livings, MD Primary HF: Dr. Gasper Lloyd  HPI: Stacey Fletcher is a 72 y.o. female with  HTN, T2DM, PTSD from son's death and diagnosis of HFrEF made last summer. In June of 2023, she reports waking up in her sleep with severe respiratory distress after a few weeks of worsening DOE. She was admitted to [facility] where she was found to have an LVEF of 30-35%; she was diuresed with IV lasix and discharged on low dose GDMT. She had a subsequent TTE in September 2023 w/ worsening LV function (LVEF 20-25%) and moderate to severe MR. Since that time she has also undergone TEE confirming presence of moderate to severe MR for which she has seen structural cardiology. Otherwise, Stacey Fletcher attempts to be as active as possible. She feels very limited by right knee osteoarthritis for which she is seeing orthopedic surgery. She is originally from [place] but moved to [place] after the murder of her son. She has excellent support here from her 2nd son.   Since starting GDMT, Stacey Fletcher has had a significant improvement in her functional status.  She no longer has PND, dyspnea or lower extremity edema.  She is now only limited by knee arthritis.  Interval hx: From a heart failure standpoint she is doing remarkably well.  No further episodes of PND, dyspnea or lower extremity edema.  She can go to the grocery store and do work around her home however is now only limited by severe bilateral knee arthritis for which she follows orthopedic surgery.  Activity level/exercise tolerance:  NYHA IIB, limited by significant knee osteoarthritis Orthopnea:  Sleeps on 2 pillows Paroxysmal noctural dyspnea:  infrequent Chest pain/pressure:  yes Orthostatic lightheadedness:  no Palpitations:  no Lower extremity edema:  no Presyncope/syncope:  no Cough:  no  Past Medical History:  Diagnosis Date   Coronary artery disease     Depression    Diabetes mellitus    Fibromyalgia    Goiter    Hypertension    MI (myocardial infarction) (HCC)    Osteoarthritis    Pneumonia    10+ years ago.    Current Outpatient Medications  Medication Sig Dispense Refill   aspirin 81 MG chewable tablet Chew 81 mg by mouth in the morning.     buPROPion (WELLBUTRIN XL) 300 MG 24 hr tablet Take 300 mg by mouth every morning.     cetirizine (ZYRTEC) 10 MG tablet Take 10 mg by mouth daily as needed for allergies.     dapagliflozin propanediol (FARXIGA) 10 MG TABS tablet Take 1 tablet (10 mg total) by mouth daily. 90 tablet 3   digoxin (LANOXIN) 0.125 MG tablet Take 1 tablet (0.125 mg total) by mouth daily. 90 tablet 3   diphenhydrAMINE (BENADRYL) 25 MG tablet Take 25 mg by mouth at bedtime.     fluticasone (FLONASE) 50 MCG/ACT nasal spray Place 1 spray into both nostrils daily as needed for allergies or rhinitis.     furosemide (LASIX) 40 MG tablet Take 1 tablet (40 mg total) by mouth every other day. 45 tablet 3   gabapentin (NEURONTIN) 100 MG capsule TAKE 1 CAPSULE(100 MG) BY MOUTH AT BEDTIME 30 capsule 6   HYDROcodone-acetaminophen (NORCO/VICODIN) 5-325 MG tablet Take 1 tablet by mouth every 8 (eight) hours as needed for moderate pain.     levothyroxine (SYNTHROID) 25 MCG tablet Take 25 mcg by mouth daily before breakfast.  losartan (COZAAR) 100 MG tablet Take 1 tablet (100 mg total) by mouth daily. 90 tablet 3   losartan (COZAAR) 50 MG tablet Take 1 tablet (50 mg total) by mouth daily. 90 tablet 3   Menthol-Methyl Salicylate (MUSCLE RUB) 10-15 % CREA Apply 1 Application topically 2 (two) times a week.     metFORMIN (GLUCOPHAGE) 1000 MG tablet Take 1,000 mg by mouth daily.     metoprolol succinate (TOPROL-XL) 100 MG 24 hr tablet Take 1 tablet (100 mg total) by mouth daily. Take with or immediately following a meal. 90 tablet 3   metoprolol succinate (TOPROL-XL) 50 MG 24 hr tablet Take 50 mg by mouth daily. Take with or immediately  following a meal.     Potassium Chloride ER 20 MEQ TBCR TAKE 1 TABLET BY MOUTH EVERY OTHER DAY 45 tablet 3   pravastatin (PRAVACHOL) 20 MG tablet Take 20 mg by mouth in the morning.     spironolactone (ALDACTONE) 25 MG tablet Take 1 tablet (25 mg total) by mouth daily. 90 tablet 3   venlafaxine XR (EFFEXOR-XR) 75 MG 24 hr capsule Take 75 mg by mouth in the morning, at noon, and at bedtime.     No current facility-administered medications for this encounter.    Allergies  Allergen Reactions   Entresto [Sacubitril-Valsartan] Hives   Prednisone Shortness Of Breath and Rash   Ace Inhibitors Other (See Comments)    Dizzy headaches, crawling feeling inside of arm   Empagliflozin Other (See Comments)    uti/yeast infection   Sitagliptin Itching   Procardia [Nifedipine] Palpitations    "heart attack symptoms"      Social History   Socioeconomic History   Marital status: Divorced    Spouse name: Not on file   Number of children: 3   Years of education: Not on file   Highest education level: Bachelor's degree (e.g., BA, AB, BS)  Occupational History   Occupation: retired  Tobacco Use   Smoking status: Former    Types: Cigarettes    Quit date: 07/07/2018    Years since quitting: 4.0   Smokeless tobacco: Never  Vaping Use   Vaping Use: Never used  Substance and Sexual Activity   Alcohol use: No   Drug use: No   Sexual activity: Not Currently  Other Topics Concern   Not on file  Social History Narrative   Not on file   Social Determinants of Health   Financial Resource Strain: Low Risk  (07/21/2021)   Overall Financial Resource Strain (CARDIA)    Difficulty of Paying Living Expenses: Not very hard  Food Insecurity: No Food Insecurity (07/21/2021)   Hunger Vital Sign    Worried About Running Out of Food in the Last Year: Never true    Ran Out of Food in the Last Year: Never true  Transportation Needs: No Transportation Needs (07/21/2021)   PRAPARE - Scientist, research (physical sciences) (Medical): No    Lack of Transportation (Non-Medical): No  Physical Activity: Not on file  Stress: Not on file  Social Connections: Not on file  Intimate Partner Violence: Not on file      Family History  Problem Relation Age of Onset   Hypertension Mother    Diabetes Mother    Stroke Mother    Hypertension Other    Diabetes Other     PHYSICAL EXAM: Vitals:   07/13/22 1031  BP: 118/70  Pulse: 84  SpO2: 97%   GENERAL: African-American  female in wheelchair, no apparent distress HEENT: Negative for arcus senilis or xanthelasma. There is no scleral icterus.  The mucous membranes are pink and moist.   NECK: Supple, No masses. Normal carotid upstrokes without bruits. No masses or thyromegaly.    CHEST: There are no chest wall deformities. There is no chest wall tenderness. Respirations are unlabored.  Lungs-CTA bilaterally CARDIAC:  JVP: 7 cm          Normal rate with regular rhythm. No murmurs, rubs or gallops.  Pulses are 2+ and symmetrical in upper and lower extremities.  No edema.  ABDOMEN: Soft, non-tender, non-distended. There are no masses or hepatomegaly. There are normal bowel sounds.  EXTREMITIES: Warm and well perfused with no cyanosis, clubbing.  LYMPHATIC: No axillary or supraclavicular lymphadenopathy.  NEUROLOGIC: Patient is oriented x3 with no focal or lateralizing neurologic deficits.  PSYCH: Patients affect is appropriate, there is no evidence of anxiety or depression.  SKIN: Warm and dry; no lesions or wounds.      DATA REVIEW  ECG: 01/09/22: NSR   ECHO: 02/20/21: LVEF 20%, moderate MR 10/24/21: LVEF 20-25%, mod-severe MR 07/20/21: LVEF 30-35%, Grade II DD, mild MR 02/20/22: LVEF 30%, grade 1 diastolic dysfunction, moderate mitral regurgitation.  CMR: 02/20/21 1. Severely dilated LV, severely reduced LV systolic function, LVEF 14%.  2. There is a thin segment of late gadolinium enhancement in the left ventricular myocardium septal mid  wall.  3.  Mild mitral regurgitation.  4.  Normal RV size and function.  5.  Findings consistent with nonischemic dilated cardiomyopathy.  CATH: 07/21/21 No CAD 1: Right atrial pressure-14/9, mean 9 2: Right ventricular pressure-50/7 3: Pulmonary artery pressure-48/27, mean 36 4: Pulmonary wedge pressure-A-wave 35, V wave 43, mean 36 5: LVEDP-33 6: Cardiac output-5.39 L/min with an index of 2.46 L/min/m    ASSESSMENT & PLAN:  Heart failure with reduced EF Etiology of ZO:[id number]; cardiac PET ordered NYHA class / AHA Stage: Significant improvement after uptitrating medical therapy and starting physical therapy. Now likely NYHA IIB. Volume status & Diuretics: Euvolemic, taking lasix 40mg  every other day.  Vasodilators: losartan 150mg  daily.  Beta-Blocker:Toprol 100mg  daily; continue digoxin daily.  Repeat digoxin level today JXB:[id number] 25mg  daily Cardiometabolic:farxiga 10mg  daily Devices therapies & Valvulopathies: Repeat echocardiogram, if function remains low will plan for primary prevention ICD. Advanced therapies: When we first starting managing Ms. Anstey she was unable to walk more than 15-20 ft and had a fairly poor baseline functional status. We have slowly uptitrated meds and started physical therapy. She is now able to work around the home and will try to mow her yard this month. She had a CPX that does not demonstrate significant cardiac limitations at this point.  Repeat echo, if function is low plan for cardiac PET and/or EP referral for primary prevention ICD.  2. Moderate Mitral regurgitation  -Appears to have improved after starting GDMT.  Will repeat echo to monitor.  If she does have severe MR plan to refer to structural cardiology.  3. HTN - well controlled now, continue losartan 150 mg daily.  Repeat labs today.  Christy Ehrsam [facility] Mechanical Circulatory Support

## 2022-07-27 ENCOUNTER — Ambulatory Visit (HOSPITAL_COMMUNITY)
Admission: RE | Admit: 2022-07-27 | Discharge: 2022-07-27 | Disposition: A | Discharge status: Home / Self Care | Payer: 59 | Source: Ambulatory Visit | Attending: Internal Medicine | Admitting: Internal Medicine

## 2022-07-27 DIAGNOSIS — R079 Chest pain, unspecified: Secondary | ICD-10-CM | POA: Insufficient documentation

## 2022-07-27 DIAGNOSIS — Z8674 Personal history of sudden cardiac arrest: Secondary | ICD-10-CM | POA: Insufficient documentation

## 2022-07-27 DIAGNOSIS — I110 Hypertensive heart disease with heart failure: Secondary | ICD-10-CM | POA: Diagnosis not present

## 2022-07-27 DIAGNOSIS — I252 Old myocardial infarction: Secondary | ICD-10-CM | POA: Insufficient documentation

## 2022-07-27 DIAGNOSIS — I059 Rheumatic mitral valve disease, unspecified: Secondary | ICD-10-CM | POA: Diagnosis not present

## 2022-07-27 DIAGNOSIS — I2510 Atherosclerotic heart disease of native coronary artery without angina pectoris: Secondary | ICD-10-CM | POA: Diagnosis not present

## 2022-07-27 DIAGNOSIS — E118 Type 2 diabetes mellitus with unspecified complications: Secondary | ICD-10-CM | POA: Diagnosis not present

## 2022-07-27 DIAGNOSIS — I5020 Unspecified systolic (congestive) heart failure: Secondary | ICD-10-CM | POA: Insufficient documentation

## 2022-07-27 LAB — ECHOCARDIOGRAM COMPLETE
Area-P 1/2: 3.42 cm2
Calc EF: 34.2 %
S' Lateral: 5.7 cm
Single Plane A2C EF: 36.4 %
Single Plane A4C EF: 36.7 %

## 2022-07-27 NOTE — Progress Notes (Signed)
  Echocardiogram 2D Echocardiogram has been performed.  Stacey Fletcher 07/27/2022, 11:46 AM

## 2022-08-08 ENCOUNTER — Telehealth (HOSPITAL_COMMUNITY): Payer: Self-pay | Admitting: *Deleted

## 2022-08-08 NOTE — Telephone Encounter (Signed)
Dorthula Nettles, DO  Helaina Stefano B, RN Yes, we are okay to continue.  Thanks, Adi       Previous Messages    ----- Message ----- From: Chelsea Aus, RN Sent: 08/07/2022  10:56 AM EDT To: Dorthula Nettles, DO  Dr. Gasper Lloyd,  Thank you for the referral.  Based upon this pt most recent echo competed on 6/21, ok to proceed with cardiac rehab?  Currently holding pending determination of next steps and the impact for independent group exercise.  Alanson Aly, BSN Cardiac and Emergency planning/management officer

## 2022-08-10 ENCOUNTER — Other Ambulatory Visit: Payer: Self-pay

## 2022-08-10 MED ORDER — POTASSIUM CHLORIDE ER 20 MEQ PO TBCR: 1.0000 | EXTENDED_RELEASE_TABLET | ORAL | 3 refills | Status: DC

## 2022-08-10 NOTE — Telephone Encounter (Signed)
Pt is being seen at CHF clinic. Please address

## 2022-08-14 ENCOUNTER — Encounter (HOSPITAL_COMMUNITY): Payer: Self-pay

## 2022-08-14 ENCOUNTER — Ambulatory Visit: Payer: 59 | Admitting: Family Medicine

## 2022-08-14 ENCOUNTER — Telehealth: Payer: Self-pay | Admitting: Family Medicine

## 2022-08-14 ENCOUNTER — Telehealth (HOSPITAL_COMMUNITY): Payer: Self-pay

## 2022-08-14 NOTE — Telephone Encounter (Signed)
I reschedule new pt appt per K.O. the pt was lost and riding around since 12 noon trying to find our office( so 1st no show is 7.9.24 )

## 2022-08-15 ENCOUNTER — Telehealth (HOSPITAL_COMMUNITY): Payer: Self-pay

## 2022-08-15 NOTE — Telephone Encounter (Signed)
Reviewed with patient the Cardiac Rehab Cardiac Risk Prolife Nursing Assessment. Patient knows office location, and to wear comfortable clothing/closed-toed shoes. Recommended to patient to eat, take medications, and if diabetic, to check blood sugar before coming to classes. Explained orientation may take 2 hours.  

## 2022-08-15 NOTE — Telephone Encounter (Signed)
LVM to call cardiac rehab at [phone]. 

## 2022-08-16 ENCOUNTER — Telehealth (HOSPITAL_COMMUNITY): Payer: Self-pay | Admitting: Cardiology

## 2022-08-16 ENCOUNTER — Ambulatory Visit (HOSPITAL_COMMUNITY): Payer: 59

## 2022-08-16 NOTE — Telephone Encounter (Signed)
Pt left VM on triage line requesting refill of kCL. Reports she has been without x 1 week  -chart reviewed -meds returned 7/5   Detailed message left for patient

## 2022-08-20 ENCOUNTER — Telehealth (HOSPITAL_COMMUNITY): Payer: Self-pay

## 2022-08-20 ENCOUNTER — Ambulatory Visit (HOSPITAL_COMMUNITY): Payer: 59

## 2022-08-20 NOTE — Telephone Encounter (Signed)
Pt called wanting to reschedule for cardiac rehab. Pt will come in for orientation on 7/23@1 :15 and will attend the 1:45 exercise class time. Gave pt information over the phone about the program.

## 2022-08-22 ENCOUNTER — Ambulatory Visit (HOSPITAL_COMMUNITY): Payer: 59

## 2022-08-24 ENCOUNTER — Ambulatory Visit (HOSPITAL_COMMUNITY): Payer: 59

## 2022-08-27 ENCOUNTER — Ambulatory Visit (HOSPITAL_COMMUNITY): Payer: 59

## 2022-08-28 ENCOUNTER — Encounter (HOSPITAL_COMMUNITY): Payer: Self-pay

## 2022-08-28 ENCOUNTER — Encounter (HOSPITAL_COMMUNITY)
Admission: RE | Admit: 2022-08-28 | Discharge: 2022-08-28 | Disposition: A | Discharge status: Home / Self Care | Payer: 59 | Source: Ambulatory Visit | Attending: Cardiology | Admitting: Cardiology

## 2022-08-28 VITALS — BP 122/74 | HR 83 | Ht 68.75 in | Wt 209.7 lb

## 2022-08-28 DIAGNOSIS — I5022 Chronic systolic (congestive) heart failure: Secondary | ICD-10-CM | POA: Insufficient documentation

## 2022-08-28 LAB — GLUCOSE, CAPILLARY: Glucose-Capillary: 125 mg/dL — ABNORMAL HIGH (ref 70–99)

## 2022-08-28 NOTE — Progress Notes (Signed)
Cardiac Rehab Medication Review by a Pharmacist  Does the patient  feel that his/her medications are working for him/her?  yes  Has the patient been experiencing any side effects to the medications prescribed?  no  Does the patient measure his/her own blood pressure or blood glucose at home?  yes   Does the patient have any problems obtaining medications due to transportation or finances?   no  Understanding of regimen: good Understanding of indications: good Potential of compliance: good    Nurse  comments: Stacey Fletcher is taking her medications as prescribed and has a good understanding of what her medications are for. Stacey Fletcher checks her blood pressure daily. Stacey Fletcher does not check her CBG's on a daily basis.    Stacey Fletcher Stacey Borden RN 08/28/2022 1:56 PM

## 2022-08-28 NOTE — Progress Notes (Signed)
Cardiac Individual Treatment Plan  Patient Details  Name: Stacey Fletcher MRN: [id number] Date of Birth: 09/02/1950 Referring Provider:   Flowsheet Row INTENSIVE CARDIAC REHAB ORIENT from 08/28/2022 in [facility]  Referring Provider Dorthula Nettles, DO       Initial Encounter Date:  Flowsheet Row INTENSIVE CARDIAC REHAB ORIENT from 08/28/2022 in [facility]  Date 08/28/22       Visit Diagnosis: Heart failure, chronic systolic (HCC)  Patient's Home Medications on Admission:  Current Outpatient Medications:    aspirin 81 MG chewable tablet, Chew 81 mg by mouth in the morning., Disp: , Rfl:    buPROPion (WELLBUTRIN XL) 300 MG 24 hr tablet, Take 300 mg by mouth every morning., Disp: , Rfl:    cetirizine (ZYRTEC) 10 MG tablet, Take 10 mg by mouth daily as needed for allergies., Disp: , Rfl:    dapagliflozin propanediol (FARXIGA) 10 MG TABS tablet, Take 1 tablet (10 mg total) by mouth daily., Disp: 90 tablet, Rfl: 3   digoxin (LANOXIN) 0.125 MG tablet, Take 1 tablet (0.125 mg total) by mouth daily., Disp: 90 tablet, Rfl: 3   diphenhydrAMINE (BENADRYL) 25 MG tablet, Take 25 mg by mouth at bedtime., Disp: , Rfl:    fluticasone (FLONASE) 50 MCG/ACT nasal spray, Place 1 spray into both nostrils daily as needed for allergies or rhinitis., Disp: , Rfl:    furosemide (LASIX) 40 MG tablet, Take 1 tablet (40 mg total) by mouth every other day., Disp: 45 tablet, Rfl: 3   gabapentin (NEURONTIN) 100 MG capsule, TAKE 1 CAPSULE(100 MG) BY MOUTH AT BEDTIME, Disp: 30 capsule, Rfl: 6   HYDROcodone-acetaminophen (NORCO/VICODIN) 5-325 MG tablet, Take 1 tablet by mouth every 8 (eight) hours as needed for moderate pain., Disp: , Rfl:    levothyroxine (SYNTHROID) 25 MCG tablet, Take 25 mcg by mouth daily before breakfast., Disp: , Rfl:    losartan (COZAAR) 100 MG tablet, Take 1 tablet (100 mg  total) by mouth daily., Disp: 90 tablet, Rfl: 3   losartan (COZAAR) 50 MG tablet, Take 1 tablet (50 mg total) by mouth daily., Disp: 90 tablet, Rfl: 3   Menthol-Methyl Salicylate (MUSCLE RUB) 10-15 % CREA, Apply 1 Application topically 2 (two) times a week., Disp: , Rfl:    metFORMIN (GLUCOPHAGE) 1000 MG tablet, Take 1,000 mg by mouth daily., Disp: , Rfl:    metoprolol succinate (TOPROL-XL) 100 MG 24 hr tablet, Take 1 tablet (100 mg total) by mouth daily. Take with or immediately following a meal., Disp: 90 tablet, Rfl: 3   metoprolol succinate (TOPROL-XL) 50 MG 24 hr tablet, Take 50 mg by mouth daily. Take with or immediately following a meal., Disp: , Rfl:    Potassium Chloride ER 20 MEQ TBCR, Take 1 tablet (20 mEq total) by mouth every other day., Disp: 45 tablet, Rfl: 3   pravastatin (PRAVACHOL) 20 MG tablet, Take 20 mg by mouth in the morning., Disp: , Rfl:    spironolactone (ALDACTONE) 25 MG tablet, Take 1 tablet (25 mg total) by mouth daily., Disp: 90 tablet, Rfl: 3   venlafaxine XR (EFFEXOR-XR) 75 MG 24 hr capsule, Take 75 mg by mouth in the morning, at noon, and at bedtime., Disp: , Rfl:   Past Medical History: Past Medical History:  Diagnosis Date   Coronary artery disease    Depression    Diabetes mellitus    Fibromyalgia  Goiter    Hypertension    MI (myocardial infarction) (HCC)    Osteoarthritis    Pneumonia    10+ years ago.    Tobacco Use: Social History   Tobacco Use  Smoking Status Former   Current packs/day: 0.00   Types: Cigarettes   Quit date: 07/07/2018   Years since quitting: 4.1  Smokeless Tobacco Never    Labs: Review Flowsheet  More data exists      Latest Ref Rng & Units 03/21/2009 07/10/2009 05/02/2020 07/20/2021 07/21/2021  Labs for ITP Cardiac and Pulmonary Rehab  Cholestrol 0 - 200 mg/dL 595  - - - -  LDL (calc) 0 - 99 mg/dL 63  - - - -  HDL-C >63 mg/dL 44  - - - -  Trlycerides <150 mg/dL 77  - - - -  Hemoglobin A1c 4.8 - 5.6 % - - 6.8  6.7  -   PH, Arterial 7.35 - 7.45 - - - - 7.427   PCO2 arterial 32 - 48 mmHg - - - - 41.6   Bicarbonate 20.0 - 28.0 mmol/L - - - - 27.4  29.3  28.5   TCO2 22 - 32 mmol/L - 31  - - 29  31  30    O2 Saturation % - - - - 96  56  58     Details       Multiple values from one day are sorted in reverse-chronological order         Capillary Blood Glucose: Lab Results  Component Value Date   GLUCAP 125 (H) 08/28/2022   GLUCAP 112 (H) 12/14/2021   GLUCAP 89 07/23/2021   GLUCAP 148 (H) 07/23/2021   GLUCAP 137 (H) 07/22/2021     Exercise Target Goals: Exercise Program Goal: Individual exercise prescription set using results from initial 6 min walk test and THRR while considering  patient's activity barriers and safety.   Exercise Prescription Goal: Initial exercise prescription builds to 30-45 minutes a day of aerobic activity, 2-3 days per week.  Home exercise guidelines will be given to patient during program as part of exercise prescription that the participant will acknowledge.  Activity Barriers & Risk Stratification:  Activity Barriers & Cardiac Risk Stratification - 08/28/22 1442       Activity Barriers & Cardiac Risk Stratification   Activity Barriers Arthritis;Other (comment);Fibromyalgia    Comments Chronic knee pain, per patient needs bilateral knee replacement. Severe OA. Has been told she has fibromyalgia.    Cardiac Risk Stratification High             6 Minute Walk:  6 Minute Walk     Row Name 08/28/22 1525         6 Minute Walk   Phase Initial     Distance 1205 feet     Walk Time 6 minutes     # of Rest Breaks 1  12-second standing rest break because nose was running     MPH 2.28     METS 2.89     RPE 7     Perceived Dyspnea  1     VO2 Peak 10.11     Symptoms Yes (comment)     Comments Mild SOB, runny nose     Resting HR 83 bpm     Resting BP 122/74     Resting Oxygen Saturation  97 %     Exercise Oxygen Saturation  during 6 min walk 98 %     Max  Ex. HR 137 bpm     Max Ex. BP 146/72     2 Minute Post BP 138/74              6 Minute Walk     Row Name 08/28/22 1525         6 Minute Walk   Phase Initial     Distance 1205 feet     Walk Time 6 minutes     # of Rest Breaks 1  12-second standing rest break because nose was running     MPH 2.28     METS 2.89     RPE 7     Perceived Dyspnea  1     VO2 Peak 10.11     Symptoms Yes (comment)     Comments Mild SOB, runny nose     Resting HR 83 bpm     Resting BP 122/74     Resting Oxygen Saturation  97 %     Exercise Oxygen Saturation  during 6 min walk 98 %     Max Ex. HR 137 bpm     Max Ex. BP 146/72     2 Minute Post BP 138/74              Oxygen Initial Assessment:   Oxygen Re-Evaluation:   Oxygen Discharge (Final Oxygen Re-Evaluation):   Initial Exercise Prescription:  Initial Exercise Prescription - 08/28/22 1600       Date of Initial Exercise RX and Referring Provider   Date 08/28/22    Referring Provider Dorthula Nettles, DO    Expected Discharge Date 11/21/22      Recumbant Bike   Level 1    Watts 25    Minutes 15    METs 2.2      NuStep   Level 1    SPM 75    Minutes 15    METs 2.2      Prescription Details   Frequency (times per week) 3    Duration Progress to 30 minutes of continuous aerobic without signs/symptoms of physical distress      Intensity   THRR 40-80% of Max Heartrate 59-118    Ratings of Perceived Exertion 11-13    Perceived Dyspnea 0-4      Progression   Progression Continue to progress workloads to maintain intensity without signs/symptoms of physical distress.      Resistance Training   Training Prescription Yes    Weight 2 lbs    Reps 10-15             Perform Capillary Blood Glucose checks as needed.  Exercise Prescription Changes:   Exercise Comments:   Exercise Goals and Review:   Exercise Goals     Row Name 08/28/22 1442             Exercise Goals   Increase Physical Activity  Yes       Intervention Provide advice, education, support and counseling about physical activity/exercise needs.;Develop an individualized exercise prescription for aerobic and resistive training based on initial evaluation findings, risk stratification, comorbidities and participant's personal goals.       Expected Outcomes Short Term: Attend rehab on a regular basis to increase amount of physical activity.;Long Term: Add in home exercise to make exercise part of routine and to increase amount of physical activity.;Long Term: Exercising regularly at least 3-5 days a week.       Increase Strength and Stamina Yes  Intervention Provide advice, education, support and counseling about physical activity/exercise needs.;Develop an individualized exercise prescription for aerobic and resistive training based on initial evaluation findings, risk stratification, comorbidities and participant's personal goals.       Expected Outcomes Long Term: Improve cardiorespiratory fitness, muscular endurance and strength as measured by increased METs and functional capacity ( );Short Term: Perform resistance training exercises routinely during rehab and add in resistance training at home;Short Term: Increase workloads from initial exercise prescription for resistance, speed, and METs.       Able to understand and use rate of perceived exertion (RPE) scale Yes       Intervention Provide education and explanation on how to use RPE scale       Expected Outcomes Short Term: Able to use RPE daily in rehab to express subjective intensity level;Long Term:  Able to use RPE to guide intensity level when exercising independently       Knowledge and understanding of Target Heart Rate Range (THRR) Yes       Intervention Provide education and explanation of THRR including how the numbers were predicted and where they are located for reference       Expected Outcomes Short Term: Able to state/look up THRR;Long Term: Able to use  THRR to govern intensity when exercising independently;Short Term: Able to use daily as guideline for intensity in rehab       Able to check pulse independently Yes       Intervention Provide education and demonstration on how to check pulse in carotid and radial arteries.;Review the importance of being able to check your own pulse for safety during independent exercise       Expected Outcomes Short Term: Able to explain why pulse checking is important during independent exercise;Long Term: Able to check pulse independently and accurately       Understanding of Exercise Prescription Yes       Intervention Provide education, explanation, and written materials on patient's individual exercise prescription       Expected Outcomes Short Term: Able to explain program exercise prescription;Long Term: Able to explain home exercise prescription to exercise independently                Exercise Goals     Row Name 08/28/22 1442             Exercise Goals   Increase Physical Activity Yes       Intervention Provide advice, education, support and counseling about physical activity/exercise needs.;Develop an individualized exercise prescription for aerobic and resistive training based on initial evaluation findings, risk stratification, comorbidities and participant's personal goals.       Expected Outcomes Short Term: Attend rehab on a regular basis to increase amount of physical activity.;Long Term: Add in home exercise to make exercise part of routine and to increase amount of physical activity.;Long Term: Exercising regularly at least 3-5 days a week.       Increase Strength and Stamina Yes       Intervention Provide advice, education, support and counseling about physical activity/exercise needs.;Develop an individualized exercise prescription for aerobic and resistive training based on initial evaluation findings, risk stratification, comorbidities and participant's personal goals.       Expected  Outcomes Long Term: Improve cardiorespiratory fitness, muscular endurance and strength as measured by increased METs and functional capacity ( );Short Term: Perform resistance training exercises routinely during rehab and add in resistance training at home;Short Term: Increase workloads from initial exercise prescription for resistance, speed,  and METs.       Able to understand and use rate of perceived exertion (RPE) scale Yes       Intervention Provide education and explanation on how to use RPE scale       Expected Outcomes Short Term: Able to use RPE daily in rehab to express subjective intensity level;Long Term:  Able to use RPE to guide intensity level when exercising independently       Knowledge and understanding of Target Heart Rate Range (THRR) Yes       Intervention Provide education and explanation of THRR including how the numbers were predicted and where they are located for reference       Expected Outcomes Short Term: Able to state/look up THRR;Long Term: Able to use THRR to govern intensity when exercising independently;Short Term: Able to use daily as guideline for intensity in rehab       Able to check pulse independently Yes       Intervention Provide education and demonstration on how to check pulse in carotid and radial arteries.;Review the importance of being able to check your own pulse for safety during independent exercise       Expected Outcomes Short Term: Able to explain why pulse checking is important during independent exercise;Long Term: Able to check pulse independently and accurately       Understanding of Exercise Prescription Yes       Intervention Provide education, explanation, and written materials on patient's individual exercise prescription       Expected Outcomes Short Term: Able to explain program exercise prescription;Long Term: Able to explain home exercise prescription to exercise independently                Exercise Goals Re-Evaluation  :   Discharge Exercise Prescription (Final Exercise Prescription Changes):   Nutrition:  Target Goals: Understanding of nutrition guidelines, daily intake of sodium 1500mg , cholesterol 200mg , calories 30% from fat and 7% or less from saturated fats, daily to have 5 or more servings of fruits and vegetables.  Biometrics:  Pre Biometrics - 08/28/22 1311       Pre Biometrics   Waist Circumference 41 inches    Hip Circumference 45.75 inches    Waist to Hip Ratio 0.9 %    Triceps Skinfold 23 mm    % Body Fat 41.5 %    Grip Strength 28 kg    Flexibility 11.5 in    Single Leg Stand 9.18 seconds              Nutrition Therapy Plan and Nutrition Goals:   Nutrition Assessments:  MEDIFICTS Score Key: ?70 Need to make dietary changes  40-70 Heart Healthy Diet ? 40 Therapeutic Level Cholesterol Diet    Picture Your Plate Scores: <95 Unhealthy dietary pattern with much room for improvement. 41-50 Dietary pattern unlikely to meet recommendations for good health and room for improvement. 51-60 More healthful dietary pattern, with some room for improvement.  >60 Healthy dietary pattern, although there may be some specific behaviors that could be improved.    Nutrition Goals Re-Evaluation:   Nutrition Goals Re-Evaluation:   Nutrition Goals Discharge (Final Nutrition Goals Re-Evaluation):   Psychosocial: Target Goals: Acknowledge presence or absence of significant depression and/or stress, maximize coping skills, provide positive support system. Participant is able to verbalize types and ability to use techniques and skills needed for reducing stress and depression.  Initial Review & Psychosocial Screening:  Initial Psych Review & Screening - 08/28/22 1421  Initial Review   Current issues with Current Psychotropic Meds;Current Stress Concerns;Current Depression;History of Depression    Source of Stress Concerns Chronic Illness;Occupation;Unable to participate in  former interests or hobbies;Unable to perform yard/household activities    Comments Cecylia has PTSD from the murder of her son when he was 54 years old. Nasim is taking an antidepressant for PTSD. Adlynn says she is not interested  in counseling at this time.      Family Dynamics   Good Support System? Yes   Arminda lives with her daughter and grandchldren. Rosmery also has a son who she has for support     Barriers   Psychosocial barriers to participate in program The patient should benefit from training in stress management and relaxation.      Screening Interventions   Interventions Encouraged to exercise    Expected Outcomes Long Term Goal: Stressors or current issues are controlled or eliminated.;Short Term goal: Identification and review with participant of any Quality of Life or Depression concerns found by scoring the questionnaire.;Long Term goal: The participant improves quality of Life and PHQ9 Scores as seen by post scores and/or verbalization of changes             Quality of Life Scores:  Quality of Life - 08/28/22 1450       Quality of Life   Select Quality of Life      Quality of Life Scores   Health/Function Pre 25.29 %    Socioeconomic Pre 22.5 %    Psych/Spiritual Pre 24.86 %    Family Pre 28.5 %    GLOBAL Pre 25.06 %            Scores of 19 and below usually indicate a poorer quality of life in these areas.  A difference of  2-3 points is a clinically meaningful difference.  A difference of 2-3 points in the total score of the Quality of Life Index has been associated with significant improvement in overall quality of life, self-image, physical symptoms, and general health in studies assessing change in quality of life.  PHQ-9: Review Flowsheet        No data to display         Interpretation of Total Score  Total Score Depression Severity:  1-4 = Minimal depression, 5-9 = Mild depression, 10-14 = Moderate depression, 15-19 = Moderately  severe depression, 20-27 = Severe depression   Psychosocial Evaluation and Intervention:   Psychosocial Re-Evaluation:   Psychosocial Discharge (Final Psychosocial Re-Evaluation):   Vocational Rehabilitation: Provide vocational rehab assistance to qualifying candidates.   Vocational Rehab Evaluation & Intervention:  Vocational Rehab - 08/28/22 1451       Initial Vocational Rehab Evaluation & Intervention   Assessment shows need for Vocational Rehabilitation No (P)              Education: Education Goals: Education classes will be provided on a weekly basis, covering required topics. Participant will state understanding/return demonstration of topics presented.     Core Videos: Exercise    Move It!  Clinical staff conducted group or individual video education with verbal and written material and guidebook.  Patient learns the recommended Pritikin exercise program. Exercise with the goal of living a long, healthy life. Some of the health benefits of exercise include controlled diabetes, healthier blood pressure levels, improved cholesterol levels, improved heart and lung capacity, improved sleep, and better body composition. Everyone should speak with their doctor before starting or changing an exercise routine.  Biomechanical Limitations Clinical staff conducted group or individual video education with verbal and written material and guidebook.  Patient learns how biomechanical limitations can impact exercise and how we can mitigate and possibly overcome limitations to have an impactful and balanced exercise routine.  Body Composition Clinical staff conducted group or individual video education with verbal and written material and guidebook.  Patient learns that body composition (ratio of muscle mass to fat mass) is a key component to assessing overall fitness, rather than body weight alone. Increased fat mass, especially visceral belly fat, can put [place] at increased risk for  metabolic syndrome, type 2 diabetes, heart disease, and even death. It is recommended to combine diet and exercise (cardiovascular and resistance training) to improve your body composition. Seek guidance from your physician and exercise physiologist before implementing an exercise routine.  Exercise Action Plan Clinical staff conducted group or individual video education with verbal and written material and guidebook.  Patient learns the recommended strategies to achieve and enjoy long-term exercise adherence, including variety, self-motivation, self-efficacy, and positive decision making. Benefits of exercise include fitness, good health, weight management, more energy, better sleep, less stress, and overall well-being.  Medical   Heart Disease Risk Reduction Clinical staff conducted group or individual video education with verbal and written material and guidebook.  Patient learns our heart is our most vital organ as it circulates oxygen, nutrients, white blood cells, and hormones throughout the entire body, and carries waste away. Data supports a plant-based eating plan like the Pritikin Program for its effectiveness in slowing progression of and reversing heart disease. The video provides a number of recommendations to address heart disease.   Metabolic Syndrome and Belly Fat  Clinical staff conducted group or individual video education with verbal and written material and guidebook.  Patient learns what metabolic syndrome is, how it leads to heart disease, and how one can reverse it and keep it from coming back. You have metabolic syndrome if you have 3 of the following 5 criteria: abdominal obesity, high blood pressure, high triglycerides, low HDL cholesterol, and high blood sugar.  Hypertension and Heart Disease Clinical staff conducted group or individual video education with verbal and written material and guidebook.  Patient learns that high blood pressure, or hypertension, is very common  in the [place]. Hypertension is largely due to excessive salt intake, but other important risk factors include being overweight, physical inactivity, drinking too much alcohol, smoking, and not eating enough potassium from fruits and vegetables. High blood pressure is a leading risk factor for heart attack, stroke, congestive heart failure, dementia, kidney failure, and premature death. Long-term effects of excessive salt intake include stiffening of the arteries and thickening of heart muscle and organ damage. Recommendations include ways to reduce hypertension and the risk of heart disease.  Diseases of Our Time - Focusing on Diabetes Clinical staff conducted group or individual video education with verbal and written material and guidebook.  Patient learns why the best way to stop diseases of our time is prevention, through food and other lifestyle changes. Medicine (such as prescription pills and surgeries) is often only a Band-Aid on the problem, not a long-term solution. Most common diseases of our time include obesity, type 2 diabetes, hypertension, heart disease, and cancer. The Pritikin Program is recommended and has been proven to help reduce, reverse, and/or prevent the damaging effects of metabolic syndrome.  Nutrition   Overview of the Pritikin Eating Plan  Clinical staff conducted group or individual video education with  verbal and written material and guidebook.  Patient learns about the Pritikin Eating Plan for disease risk reduction. The Pritikin Eating Plan emphasizes a wide variety of unrefined, minimally-processed carbohydrates, like fruits, vegetables, whole grains, and legumes. Go, Caution, and Stop food choices are explained. Plant-based and lean animal proteins are emphasized. Rationale provided for low sodium intake for blood pressure control, low added sugars for blood sugar stabilization, and low added fats and oils for coronary artery disease risk reduction and weight  management.  Calorie Density  Clinical staff conducted group or individual video education with verbal and written material and guidebook.  Patient learns about calorie density and how it impacts the Pritikin Eating Plan. Knowing the characteristics of the food you choose will help you decide whether those foods will lead to weight gain or weight loss, and whether you want to consume more or less of them. Weight loss is usually a side effect of the Pritikin Eating Plan because of its focus on low calorie-dense foods.  Label Reading  Clinical staff conducted group or individual video education with verbal and written material and guidebook.  Patient learns about the Pritikin recommended label reading guidelines and corresponding recommendations regarding calorie density, added sugars, sodium content, and whole grains.  Dining Out - Part 1  Clinical staff conducted group or individual video education with verbal and written material and guidebook.  Patient learns that restaurant meals can be sabotaging because they can be so high in calories, fat, sodium, and/or sugar. Patient learns recommended strategies on how to positively address this and avoid unhealthy pitfalls.  Facts on Fats  Clinical staff conducted group or individual video education with verbal and written material and guidebook.  Patient learns that lifestyle modifications can be just as effective, if not more so, as many medications for lowering your risk of heart disease. A Pritikin lifestyle can help to reduce your risk of inflammation and atherosclerosis (cholesterol build-up, or plaque, in the artery walls). Lifestyle interventions such as dietary choices and physical activity address the cause of atherosclerosis. A review of the types of fats and their impact on blood cholesterol levels, along with dietary recommendations to reduce fat intake is also included.  Nutrition Action Plan  Clinical staff conducted group or individual  video education with verbal and written material and guidebook.  Patient learns how to incorporate Pritikin recommendations into their lifestyle. Recommendations include planning and keeping personal health goals in mind as an important part of their success.  Healthy Mind-Set    Healthy Minds, Bodies, Hearts  Clinical staff conducted group or individual video education with verbal and written material and guidebook.  Patient learns how to identify when they are stressed. Video will discuss the impact of that stress, as well as the many benefits of stress management. Patient will also be introduced to stress management techniques. The way we think, act, and feel has an impact on our hearts.  How Our Thoughts Can Heal Our Hearts  Clinical staff conducted group or individual video education with verbal and written material and guidebook.  Patient learns that negative thoughts can cause depression and anxiety. This can result in negative lifestyle behavior and serious health problems. Cognitive behavioral therapy is an effective method to help control our thoughts in order to change and improve our emotional outlook.  Additional Videos:  Exercise    Improving Performance  Clinical staff conducted group or individual video education with verbal and written material and guidebook.  Patient learns to use a non-linear  approach by alternating intensity levels and lengths of time spent exercising to help burn more calories and lose more body fat. Cardiovascular exercise helps improve heart health, metabolism, hormonal balance, blood sugar control, and recovery from fatigue. Resistance training improves strength, endurance, balance, coordination, reaction time, metabolism, and muscle mass. Flexibility exercise improves circulation, posture, and balance. Seek guidance from your physician and exercise physiologist before implementing an exercise routine and learn your capabilities and proper form for all  exercise.  Introduction to Yoga  Clinical staff conducted group or individual video education with verbal and written material and guidebook.  Patient learns about yoga, a discipline of the coming together of mind, breath, and body. The benefits of yoga include improved flexibility, improved range of motion, better posture and core strength, increased lung function, weight loss, and positive self-image. Yoga's heart health benefits include lowered blood pressure, healthier heart rate, decreased cholesterol and triglyceride levels, improved immune function, and reduced stress. Seek guidance from your physician and exercise physiologist before implementing an exercise routine and learn your capabilities and proper form for all exercise.  Medical   Aging: Enhancing Your Quality of Life  Clinical staff conducted group or individual video education with verbal and written material and guidebook.  Patient learns key strategies and recommendations to stay in good physical health and enhance quality of life, such as prevention strategies, having an advocate, securing a Health Care Proxy and Power of Attorney, and keeping a list of medications and system for tracking them. It also discusses how to avoid risk for bone loss.  Biology of Weight Control  Clinical staff conducted group or individual video education with verbal and written material and guidebook.  Patient learns that weight gain occurs because we consume more calories than we burn (eating more, moving less). Even if your body weight is normal, you may have higher ratios of fat compared to muscle mass. Too much body fat puts you at increased risk for cardiovascular disease, heart attack, stroke, type 2 diabetes, and obesity-related cancers. In addition to exercise, following the Pritikin Eating Plan can help reduce your risk.  Decoding Lab Results  Clinical staff conducted group or individual video education with verbal and written material and  guidebook.  Patient learns that lab test reflects one measurement whose values change over time and are influenced by many factors, including medication, stress, sleep, exercise, food, hydration, pre-existing medical conditions, and more. It is recommended to use the knowledge from this video to become more involved with your lab results and evaluate your numbers to speak with your doctor.   Diseases of Our Time - Overview  Clinical staff conducted group or individual video education with verbal and written material and guidebook.  Patient learns that according to the CDC, 50% to 70% of chronic diseases (such as obesity, type 2 diabetes, elevated lipids, hypertension, and heart disease) are avoidable through lifestyle improvements including healthier food choices, listening to satiety cues, and increased physical activity.  Sleep Disorders Clinical staff conducted group or individual video education with verbal and written material and guidebook.  Patient learns how good quality and duration of sleep are important to overall health and well-being. Patient also learns about sleep disorders and how they impact health along with recommendations to address them, including discussing with a physician.  Nutrition  Dining Out - Part 2 Clinical staff conducted group or individual video education with verbal and written material and guidebook.  Patient learns how to plan ahead and communicate in order to maximize  their dining experience in a healthy and nutritious manner. Included are recommended food choices based on the type of restaurant the patient is visiting.   Fueling a Banker conducted group or individual video education with verbal and written material and guidebook.  There is a strong connection between our food choices and our health. Diseases like obesity and type 2 diabetes are very prevalent and are in large-part due to lifestyle choices. The Pritikin Eating Plan  provides plenty of food and hunger-curbing satisfaction. It is easy to follow, affordable, and helps reduce health risks.  Menu Workshop  Clinical staff conducted group or individual video education with verbal and written material and guidebook.  Patient learns that restaurant meals can sabotage health goals because they are often packed with calories, fat, sodium, and sugar. Recommendations include strategies to plan ahead and to communicate with the manager, chef, or server to help order a healthier meal.  Planning Your Eating Strategy  Clinical staff conducted group or individual video education with verbal and written material and guidebook.  Patient learns about the Pritikin Eating Plan and its benefit of reducing the risk of disease. The Pritikin Eating Plan does not focus on calories. Instead, it emphasizes high-quality, nutrient-rich foods. By knowing the characteristics of the foods, we choose, we can determine their calorie density and make informed decisions.  Targeting Your Nutrition Priorities  Clinical staff conducted group or individual video education with verbal and written material and guidebook.  Patient learns that lifestyle habits have a tremendous impact on disease risk and progression. This video provides eating and physical activity recommendations based on your personal health goals, such as reducing LDL cholesterol, losing weight, preventing or controlling type 2 diabetes, and reducing high blood pressure.  Vitamins and Minerals  Clinical staff conducted group or individual video education with verbal and written material and guidebook.  Patient learns different ways to obtain key vitamins and minerals, including through a recommended healthy diet. It is important to discuss all supplements you take with your doctor.   Healthy Mind-Set    Smoking Cessation  Clinical staff conducted group or individual video education with verbal and written material and guidebook.   Patient learns that cigarette smoking and tobacco addiction pose a serious health risk which affects millions of people. Stopping smoking will significantly reduce the risk of heart disease, lung disease, and many forms of cancer. Recommended strategies for quitting are covered, including working with your doctor to develop a successful plan.  Culinary   Becoming a Set designer conducted group or individual video education with verbal and written material and guidebook.  Patient learns that cooking at home can be healthy, cost-effective, quick, and puts them in control. Keys to cooking healthy recipes will include looking at your recipe, assessing your equipment needs, planning ahead, making it simple, choosing cost-effective seasonal ingredients, and limiting the use of added fats, salts, and sugars.  Cooking - Breakfast and Snacks  Clinical staff conducted group or individual video education with verbal and written material and guidebook.  Patient learns how important breakfast is to satiety and nutrition through the entire day. Recommendations include key foods to eat during breakfast to help stabilize blood sugar levels and to prevent overeating at meals later in the day. Planning ahead is also a key component.  Cooking - Educational psychologist conducted group or individual video education with verbal and written material and guidebook.  Patient learns eating strategies to improve  overall health, including an approach to cook more at home. Recommendations include thinking of animal protein as a side on your plate rather than center stage and focusing instead on lower calorie dense options like vegetables, fruits, whole grains, and plant-based proteins, such as beans. Making sauces in large quantities to freeze for later and leaving the skin on your vegetables are also recommended to maximize your experience.  Cooking - Healthy Salads and Dressing Clinical staff  conducted group or individual video education with verbal and written material and guidebook.  Patient learns that vegetables, fruits, whole grains, and legumes are the foundations of the Pritikin Eating Plan. Recommendations include how to incorporate each of these in flavorful and healthy salads, and how to create homemade salad dressings. Proper handling of ingredients is also covered. Cooking - Soups and [employer] - Soups and Desserts Clinical staff conducted group or individual video education with verbal and written material and guidebook.  Patient learns that Pritikin soups and desserts make for easy, nutritious, and delicious snacks and meal components that are low in sodium, fat, sugar, and calorie density, while high in vitamins, minerals, and filling fiber. Recommendations include simple and healthy ideas for soups and desserts.   Overview     The Pritikin Solution Program Overview Clinical staff conducted group or individual video education with verbal and written material and guidebook.  Patient learns that the results of the Pritikin Program have been documented in more than 100 articles published in peer-reviewed journals, and the benefits include reducing risk factors for (and, in some cases, even reversing) high cholesterol, high blood pressure, type 2 diabetes, obesity, and more! An overview of the three key pillars of the Pritikin Program will be covered: eating well, doing regular exercise, and having a healthy mind-set.  WORKSHOPS  Exercise: Exercise Basics: Building Your Action Plan Clinical staff led group instruction and group discussion with PowerPoint presentation and patient guidebook. To enhance the learning environment the use of posters, models and videos may be added. At the conclusion of this workshop, patients will comprehend the difference between physical activity and exercise, as well as the benefits of incorporating both, into their routine. Patients will  understand the FITT (Frequency, Intensity, Time, and Type) principle and how to use it to build an exercise action plan. In addition, safety concerns and other considerations for exercise and cardiac rehab will be addressed by the presenter. The purpose of this lesson is to promote a comprehensive and effective weekly exercise routine in order to improve patients' overall level of fitness.   Managing Heart Disease: Your Path to a Healthier Heart Clinical staff led group instruction and group discussion with PowerPoint presentation and patient guidebook. To enhance the learning environment the use of posters, models and videos may be added.At the conclusion of this workshop, patients will understand the anatomy and physiology of the heart. Additionally, they will understand how Pritikin's three pillars impact the risk factors, the progression, and the management of heart disease.  The purpose of this lesson is to provide a high-level overview of the heart, heart disease, and how the Pritikin lifestyle positively impacts risk factors.  Exercise Biomechanics Clinical staff led group instruction and group discussion with PowerPoint presentation and patient guidebook. To enhance the learning environment the use of posters, models and videos may be added. Patients will learn how the structural parts of their bodies function and how these functions impact their daily activities, movement, and exercise. Patients will learn how to promote a neutral  spine, learn how to manage pain, and identify ways to improve their physical movement in order to promote healthy living. The purpose of this lesson is to expose patients to common physical limitations that impact physical activity. Participants will learn practical ways to adapt and manage aches and pains, and to minimize their effect on regular exercise. Patients will learn how to maintain good posture while sitting, walking, and lifting.  Balance Training  and Fall Prevention  Clinical staff led group instruction and group discussion with PowerPoint presentation and patient guidebook. To enhance the learning environment the use of posters, models and videos may be added. At the conclusion of this workshop, patients will understand the importance of their sensorimotor skills (vision, proprioception, and the vestibular system) in maintaining their ability to balance as they age. Patients will apply a variety of balancing exercises that are appropriate for their current level of function. Patients will understand the common causes for poor balance, possible solutions to these problems, and ways to modify their physical environment in order to minimize their fall risk. The purpose of this lesson is to teach patients about the importance of maintaining balance as they age and ways to minimize their risk of falling.  WORKSHOPS   Nutrition:  Fueling a Ship broker led group instruction and group discussion with PowerPoint presentation and patient guidebook. To enhance the learning environment the use of posters, models and videos may be added. Patients will review the foundational principles of the Pritikin Eating Plan and understand what constitutes a serving size in each of the food groups. Patients will also learn Pritikin-friendly foods that are better choices when away from home and review make-ahead meal and snack options. Calorie density will be reviewed and applied to three nutrition priorities: weight maintenance, weight loss, and weight gain. The purpose of this lesson is to reinforce (in a group setting) the key concepts around what patients are recommended to eat and how to apply these guidelines when away from home by planning and selecting Pritikin-friendly options. Patients will understand how calorie density may be adjusted for different weight management goals.  Mindful Eating  Clinical staff led group instruction and group  discussion with PowerPoint presentation and patient guidebook. To enhance the learning environment the use of posters, models and videos may be added. Patients will briefly review the concepts of the Pritikin Eating Plan and the importance of low-calorie dense foods. The concept of mindful eating will be introduced as well as the importance of paying attention to internal hunger signals. Triggers for non-hunger eating and techniques for dealing with triggers will be explored. The purpose of this lesson is to provide patients with the opportunity to review the basic principles of the Pritikin Eating Plan, discuss the value of eating mindfully and how to measure internal cues of hunger and fullness using the Hunger Scale. Patients will also discuss reasons for non-hunger eating and learn strategies to use for controlling emotional eating.  Targeting Your Nutrition Priorities Clinical staff led group instruction and group discussion with PowerPoint presentation and patient guidebook. To enhance the learning environment the use of posters, models and videos may be added. Patients will learn how to determine their genetic susceptibility to disease by reviewing their family history. Patients will gain insight into the importance of diet as part of an overall healthy lifestyle in mitigating the impact of genetics and other environmental insults. The purpose of this lesson is to provide patients with the opportunity to assess their personal nutrition priorities  by looking at their family history, their own health history and current risk factors. Patients will also be able to discuss ways of prioritizing and modifying the Pritikin Eating Plan for their highest risk areas  Menu  Clinical staff led group instruction and group discussion with PowerPoint presentation and patient guidebook. To enhance the learning environment the use of posters, models and videos may be added. Using menus brought in from [employer],  or printed from [employer], patients will apply the Pritikin dining out guidelines that were presented in the [employer] video. Patients will also be able to practice these guidelines in a variety of provided scenarios. The purpose of this lesson is to provide patients with the opportunity to practice hands-on learning of the Pritikin Dining Out guidelines with actual menus and practice scenarios.  Label Reading Clinical staff led group instruction and group discussion with PowerPoint presentation and patient guidebook. To enhance the learning environment the use of posters, models and videos may be added. Patients will review and discuss the Pritikin label reading guidelines presented in Pritikin's Label Reading Educational series video. Using fool labels brought in from local grocery stores and markets, patients will apply the label reading guidelines and determine if the packaged food meet the Pritikin guidelines. The purpose of this lesson is to provide patients with the opportunity to review, discuss, and practice hands-on learning of the Pritikin Label Reading guidelines with actual packaged food labels. Cooking School  Pritikin's [employer] are designed to teach patients ways to prepare quick, simple, and affordable recipes at home. The importance of nutrition's role in chronic disease risk reduction is reflected in its emphasis in the overall Pritikin program. By learning how to prepare essential core Pritikin Eating Plan recipes, patients will increase control over what they eat; be able to customize the flavor of foods without the use of added salt, sugar, or fat; and improve the quality of the food they consume. By learning a set of core recipes which are easily assembled, quickly prepared, and affordable, patients are more likely to prepare more healthy foods at home. These workshops focus on convenient breakfasts, simple entres, side dishes, and desserts which  can be prepared with minimal effort and are consistent with nutrition recommendations for cardiovascular risk reduction. Cooking [employer] are taught by a Armed forces logistics/support/administrative officer (RD) who has been trained by the [employer]. The chef or RD has a clear understanding of the importance of minimizing - if not completely eliminating - added fat, sugar, and sodium in recipes. Throughout the series of Cooking School Workshop sessions, patients will learn about healthy ingredients and efficient methods of cooking to build confidence in their capability to prepare    Cooking School weekly topics:  Adding Flavor- Sodium-Free  Fast and Healthy Breakfasts  Powerhouse Plant-Based Proteins  Satisfying Salads and Dressings  Simple Sides and Sauces  International Cuisine-Spotlight on the [employer] Zones  Delicious Desserts  Savory Soups  [employer] - Meals in a Astronomer Appetizers and Snacks  Comforting Weekend Breakfasts  One-Pot Wonders   Fast Evening Meals  Landscape architect Your Pritikin Plate  WORKSHOPS   Healthy Mindset (Psychosocial):  Focused Goals, Sustainable Changes Clinical staff led group instruction and group discussion with PowerPoint presentation and patient guidebook. To enhance the learning environment the use of posters, models and videos may be added. Patients will be able to apply effective goal setting strategies to establish at least one personal  goal, and then take consistent, meaningful action toward that goal. They will learn to identify common barriers to achieving personal goals and develop strategies to overcome them. Patients will also gain an understanding of how our mind-set can impact our ability to achieve goals and the importance of cultivating a positive and growth-oriented mind-set. The purpose of this lesson is to provide patients with a deeper understanding of how to set and achieve personal goals, as well as the tools  and strategies needed to overcome common obstacles which may arise along the way.  From Head to Heart: The Power of a Healthy Outlook  Clinical staff led group instruction and group discussion with PowerPoint presentation and patient guidebook. To enhance the learning environment the use of posters, models and videos may be added. Patients will be able to recognize and describe the impact of emotions and mood on physical health. They will discover the importance of self-care and explore self-care practices which may work for them. Patients will also learn how to utilize the 4 C's to cultivate a healthier outlook and better manage stress and challenges. The purpose of this lesson is to demonstrate to patients how a healthy outlook is an essential part of maintaining good health, especially as they continue their cardiac rehab journey.  Healthy Sleep for a Healthy Heart Clinical staff led group instruction and group discussion with PowerPoint presentation and patient guidebook. To enhance the learning environment the use of posters, models and videos may be added. At the conclusion of this workshop, patients will be able to demonstrate knowledge of the importance of sleep to overall health, well-being, and quality of life. They will understand the symptoms of, and treatments for, common sleep disorders. Patients will also be able to identify daytime and nighttime behaviors which impact sleep, and they will be able to apply these tools to help manage sleep-related challenges. The purpose of this lesson is to provide patients with a general overview of sleep and outline the importance of quality sleep. Patients will learn about a few of the most common sleep disorders. Patients will also be introduced to the concept of "sleep hygiene," and discover ways to self-manage certain sleeping problems through simple daily behavior changes. Finally, the workshop will motivate patients by clarifying the links between  quality sleep and their goals of heart-healthy living.   Recognizing and Reducing Stress Clinical staff led group instruction and group discussion with PowerPoint presentation and patient guidebook. To enhance the learning environment the use of posters, models and videos may be added. At the conclusion of this workshop, patients will be able to understand the types of stress reactions, differentiate between acute and chronic stress, and recognize the impact that chronic stress has on their health. They will also be able to apply different coping mechanisms, such as reframing negative self-talk. Patients will have the opportunity to practice a variety of stress management techniques, such as deep abdominal breathing, progressive muscle relaxation, and/or guided imagery.  The purpose of this lesson is to educate patients on the role of stress in their lives and to provide healthy techniques for coping with it.  Learning Barriers/Preferences:  Learning Barriers/Preferences - 08/28/22 1612       Learning Barriers/Preferences   Learning Barriers Sight   wears glasses.   Learning Preferences Audio;Skilled Demonstration             Education Topics:  Knowledge Questionnaire Score:  Knowledge Questionnaire Score - 08/28/22 1438       Knowledge Questionnaire Score  Pre Score 22/24             Core Components/Risk Factors/Patient Goals at Admission:  Personal Goals and Risk Factors at Admission - 08/28/22 1442       Core Components/Risk Factors/Patient Goals on Admission    Weight Management Yes;Obesity;Weight Loss    Intervention Weight Management/Obesity: Establish reasonable short term and long term weight goals.;Obesity: Provide education and appropriate resources to help participant work on and attain dietary goals.    Admit Weight 209 lb 10.5 oz (95.1 kg)    Expected Outcomes Short Term: Continue to assess and modify interventions until short term weight is achieved;Long Term:  Adherence to nutrition and physical activity/exercise program aimed toward attainment of established weight goal;Weight Loss: Understanding of general recommendations for a balanced deficit meal plan, which promotes 1-2 lb weight loss per week and includes a negative energy balance of [phone] kcal/d    Diabetes Yes    Intervention Provide education about signs/symptoms and action to take for hypo/hyperglycemia.;Provide education about proper nutrition, including hydration, and aerobic/resistive exercise prescription along with prescribed medications to achieve blood glucose in normal ranges: Fasting glucose 65-99 mg/dL    Expected Outcomes Short Term: Participant verbalizes understanding of the signs/symptoms and immediate care of hyper/hypoglycemia, proper foot care and importance of medication, aerobic/resistive exercise and nutrition plan for blood glucose control.;Long Term: Attainment of HbA1C < 7%.    Heart Failure Yes    Intervention Provide a combined exercise and nutrition program that is supplemented with education, support and counseling about heart failure. Directed toward relieving symptoms such as shortness of breath, decreased exercise tolerance, and extremity edema.    Expected Outcomes Improve functional capacity of life;Short term: Attendance in program 2-3 days a week with increased exercise capacity. Reported lower sodium intake. Reported increased fruit and vegetable intake. Reports medication compliance.;Short term: Daily weights obtained and reported for increase. Utilizing diuretic protocols set by physician.;Long term: Adoption of self-care skills and reduction of barriers for early signs and symptoms recognition and intervention leading to self-care maintenance.    Hypertension Yes    Intervention Provide education on lifestyle modifcations including regular physical activity/exercise, weight management, moderate sodium restriction and increased consumption of fresh fruit,  vegetables, and low fat dairy, alcohol moderation, and smoking cessation.;Monitor prescription use compliance.    Expected Outcomes Short Term: Continued assessment and intervention until BP is < 140/67mm HG in hypertensive participants. < 130/46mm HG in hypertensive participants with diabetes, heart failure or chronic kidney disease.;Long Term: Maintenance of blood pressure at goal levels.    Lipids Yes    Intervention Provide education and support for participant on nutrition & aerobic/resistive exercise along with prescribed medications to achieve LDL 70mg , HDL >40mg .    Expected Outcomes Short Term: Participant states understanding of desired cholesterol values and is compliant with medications prescribed. Participant is following exercise prescription and nutrition guidelines.;Long Term: Cholesterol controlled with medications as prescribed, with individualized exercise RX and with personalized nutrition plan. Value goals: LDL < 70mg , HDL > 40 mg.    Stress Yes    Intervention Offer individual and/or small group education and counseling on adjustment to heart disease, stress management and health-related lifestyle change. Teach and support self-help strategies.    Expected Outcomes Short Term: Participant demonstrates changes in health-related behavior, relaxation and other stress management skills, ability to obtain effective social support, and compliance with psychotropic medications if prescribed.;Long Term: Emotional wellbeing is indicated by absence of clinically significant psychosocial distress or social isolation.  Core Components/Risk Factors/Patient Goals Review:    Core Components/Risk Factors/Patient Goals at Discharge (Final Review):    ITP Comments:  ITP Comments     Row Name 08/28/22 1311           ITP Comments Medical Director- Dr. Armanda Magic, MD. Introduction to the [facility] / Intensive Cardiac Rehab. Reviewed initial orientation  folder with patient.                ITP Comments     Row Name 08/28/22 1311           ITP Comments Medical Director- Dr. Armanda Magic, MD. Introduction to the [facility] / Intensive Cardiac Rehab. Reviewed initial orientation folder with patient.                Comments: Ms. Mcnorton attended orientation for the cardiac rehabilitation program on  08/28/2022  to perform initial intake and exercise walk test. She was introduced to the [employer] education and orientation packet was reviewed. Completed 6-minute walk test, measurements, initial ITP, and exercise prescription. Vital signs stable. Telemetry-normal sinus rhythm, asymptomatic.   Service time was from 1311 to 1543. Artist Pais, MS, ACSM CEP

## 2022-08-29 ENCOUNTER — Ambulatory Visit (HOSPITAL_COMMUNITY): Payer: 59

## 2022-08-30 ENCOUNTER — Encounter: Payer: Self-pay | Admitting: Orthopedic Surgery

## 2022-08-30 ENCOUNTER — Ambulatory Visit (INDEPENDENT_AMBULATORY_CARE_PROVIDER_SITE_OTHER): Payer: 59 | Admitting: Orthopedic Surgery

## 2022-08-30 DIAGNOSIS — M170 Bilateral primary osteoarthritis of knee: Secondary | ICD-10-CM

## 2022-08-30 NOTE — Progress Notes (Signed)
Office Visit Note   Patient: Cynde Cabal           Date of Birth: 11/07/1950           MRN: [id number] Visit Date: 08/30/2022              Requested by: Garnette Gunner, MD [address] [place],  [place] [address] PCP: Garnette Gunner, MD  Chief Complaint  Patient presents with   Left Knee - Pain   Right Knee - Pain      HPI: Patient presents 1 month after previous steroid injections in her knees requesting an additional injection.  Assessment & Plan: Visit Diagnoses:  1. Bilateral primary osteoarthritis of knee     Plan: Patient will follow-up 3 months from her last injection for repeat injection.  Recommended Voltaren gel topically  Follow-Up Instructions: Return in about 2 months (around 10/31/2022).   Ortho Exam  Patient is alert, oriented, no adenopathy, well-dressed, normal affect, normal respiratory effort. Patient is 1 month status post previous steroid injection no effusion  Imaging: No results found. No images are attached to the encounter.  Labs: Lab Results  Component Value Date   HGBA1C 6.7 (H) 07/20/2021   HGBA1C 6.8 (H) 05/02/2020   ESRSEDRATE 13 08/03/2009   REPTSTATUS 05/11/2013 FINAL 05/07/2013   CULT  05/07/2013    NO SALMONELLA, SHIGELLA, CAMPYLOBACTER, YERSINIA, OR E.COLI 0157:H7 ISOLATED Performed at [employer] [facility]   [facility] ESCHERICHIA COLI 05/06/2013     Lab Results  Component Value Date   ALBUMIN 3.8 08/10/2021   ALBUMIN 3.7 05/02/2020   ALBUMIN 4.3 08/05/2016    Lab Results  Component Value Date   MG 1.9 07/20/2021   MG 1.7 05/06/2013   MG 1.9 10/20/2011   Lab Results  Component Value Date   VD25OH 30 03/01/2009    No results found for: "PREALBUMIN"    Latest Ref Rng & Units 07/23/2021    2:31 AM 07/22/2021   12:57 AM 07/21/2021    2:21 PM  CBC EXTENDED  WBC 4.0 - 10.5 K/uL 8.6  7.7    RBC 3.87 - 5.11 MIL/uL 4.93  4.51    Hemoglobin 12.0 - 15.0 g/dL 40.1  02.7  25.3   HCT 36.0 - 46.0 %  39.7  36.3  36.0   Platelets 150 - 400 K/uL 241  223       There is no height or weight on file to calculate BMI.  Orders:  No orders of the defined types were placed in this encounter.  No orders of the defined types were placed in this encounter.    Procedures: No procedures performed  Clinical Data: No additional findings.  ROS:  All other systems negative, except as noted in the HPI. Review of Systems  Objective: Vital Signs: There were no vitals taken for this visit.  Specialty Comments:  No specialty comments available.  PMFS History: Patient Active Problem List   Diagnosis Date Noted   Severe mitral regurgitation 12/14/2021   Acute respiratory failure with hypoxia (HCC) 07/20/2021   Elevated troponin 07/20/2021   Acute CHF (congestive heart failure) (HCC) 07/20/2021   Pneumonia    Osteoarthritis    MI (myocardial infarction) (HCC)    Hypertension    Goiter    Fibromyalgia    Depression    Coronary artery disease    Arthritis of carpometacarpal [facility]) joint of right thumb 07/15/2018   Pain of right hand 07/15/2018   ARF (acute  renal failure) (HCC) 08/20/2014   Chest pain 05/06/2013   Nausea vomiting and diarrhea 05/06/2013   Fever 05/06/2013   HTN (hypertension) 05/06/2013   Hypothyroidism 05/06/2013   Diabetes mellitus (HCC) 05/06/2013   Past Medical History:  Diagnosis Date   Coronary artery disease    Depression    Diabetes mellitus    Fibromyalgia    Goiter    Hypertension    MI (myocardial infarction) (HCC)    Osteoarthritis    Pneumonia    10+ years ago.    Family History  Problem Relation Age of Onset   Hypertension Mother    Diabetes Mother    Stroke Mother    Hypertension Other    Diabetes Other     Past Surgical History:  Procedure Laterality Date   ABDOMINAL HYSTERECTOMY     BREAST SURGERY     BUBBLE STUDY  12/14/2021   Procedure: BUBBLE STUDY;  Surgeon: Christell Constant, MD;  Location: [facility] ENDOSCOPY;  Service:  Cardiovascular;;   CARDIAC CATHETERIZATION     1995   REDUCTION MAMMAPLASTY Bilateral    RIGHT/LEFT HEART CATH AND CORONARY ANGIOGRAPHY N/A 07/21/2021   Procedure: RIGHT/LEFT HEART CATH AND CORONARY ANGIOGRAPHY;  Surgeon: Runell Gess, MD;  Location: [facility] INVASIVE CV LAB;  Service: Cardiovascular;  Laterality: N/A;   TEE WITHOUT CARDIOVERSION N/A 12/14/2021   Procedure: TRANSESOPHAGEAL ECHOCARDIOGRAM (TEE);  Surgeon: Christell Constant, MD;  Location: [facility] ENDOSCOPY;  Service: Cardiovascular;  Laterality: N/A;   Social History   Occupational History   Occupation: retired  Tobacco Use   Smoking status: Former    Current packs/day: 0.00    Types: Cigarettes    Quit date: 07/07/2018    Years since quitting: 4.1   Smokeless tobacco: Never  Vaping Use   Vaping status: Never Used  Substance and Sexual Activity   Alcohol use: No   Drug use: No   Sexual activity: Not Currently

## 2022-08-31 ENCOUNTER — Ambulatory Visit (HOSPITAL_COMMUNITY): Payer: 59

## 2022-09-03 ENCOUNTER — Encounter (HOSPITAL_COMMUNITY): Payer: 59

## 2022-09-03 ENCOUNTER — Ambulatory Visit (HOSPITAL_COMMUNITY): Payer: 59

## 2022-09-05 ENCOUNTER — Telehealth (HOSPITAL_COMMUNITY): Payer: Self-pay | Admitting: *Deleted

## 2022-09-05 ENCOUNTER — Encounter (HOSPITAL_COMMUNITY)
Admission: RE | Admit: 2022-09-05 | Discharge: 2022-09-05 | Disposition: A | Discharge status: Home / Self Care | Payer: 59 | Source: Ambulatory Visit | Attending: Cardiology | Admitting: Cardiology

## 2022-09-05 ENCOUNTER — Ambulatory Visit (HOSPITAL_COMMUNITY): Payer: 59

## 2022-09-05 DIAGNOSIS — I5022 Chronic systolic (congestive) heart failure: Secondary | ICD-10-CM | POA: Diagnosis not present

## 2022-09-05 LAB — GLUCOSE, CAPILLARY
Glucose-Capillary: 144 mg/dL — ABNORMAL HIGH (ref 70–99)
Glucose-Capillary: 109 mg/dL — ABNORMAL HIGH (ref 70–99)

## 2022-09-05 NOTE — Progress Notes (Signed)
Cardiac Individual Treatment Plan  Patient Details  Name: Stacey Fletcher MRN: [id number] Date of Birth: 12/29/1950 Referring Provider:   Flowsheet Row INTENSIVE CARDIAC REHAB ORIENT from 08/28/2022 in [facility]  Referring Provider Dorthula Nettles, DO       Initial Encounter Date:  Flowsheet Row INTENSIVE CARDIAC REHAB ORIENT from 08/28/2022 in [facility]  Date 08/28/22       Visit Diagnosis: Heart failure, chronic systolic (HCC)  Patient's Home Medications on Admission:  Current Outpatient Medications:    aspirin 81 MG chewable tablet, Chew 81 mg by mouth in the morning., Disp: , Rfl:    buPROPion (WELLBUTRIN XL) 300 MG 24 hr tablet, Take 300 mg by mouth every morning., Disp: , Rfl:    cetirizine (ZYRTEC) 10 MG tablet, Take 10 mg by mouth daily as needed for allergies., Disp: , Rfl:    dapagliflozin propanediol (FARXIGA) 10 MG TABS tablet, Take 1 tablet (10 mg total) by mouth daily., Disp: 90 tablet, Rfl: 3   digoxin (LANOXIN) 0.125 MG tablet, Take 1 tablet (0.125 mg total) by mouth daily., Disp: 90 tablet, Rfl: 3   diphenhydrAMINE (BENADRYL) 25 MG tablet, Take 25 mg by mouth at bedtime., Disp: , Rfl:    fluticasone (FLONASE) 50 MCG/ACT nasal spray, Place 1 spray into both nostrils daily as needed for allergies or rhinitis., Disp: , Rfl:    furosemide (LASIX) 40 MG tablet, Take 1 tablet (40 mg total) by mouth every other day., Disp: 45 tablet, Rfl: 3   gabapentin (NEURONTIN) 100 MG capsule, TAKE 1 CAPSULE(100 MG) BY MOUTH AT BEDTIME, Disp: 30 capsule, Rfl: 6   HYDROcodone-acetaminophen (NORCO/VICODIN) 5-325 MG tablet, Take 1 tablet by mouth every 8 (eight) hours as needed for moderate pain., Disp: , Rfl:    levothyroxine (SYNTHROID) 25 MCG tablet, Take 25 mcg by mouth daily before breakfast., Disp: , Rfl:    losartan (COZAAR) 100 MG tablet, Take 1 tablet (100 mg  total) by mouth daily., Disp: 90 tablet, Rfl: 3   losartan (COZAAR) 50 MG tablet, Take 1 tablet (50 mg total) by mouth daily., Disp: 90 tablet, Rfl: 3   Menthol-Methyl Salicylate (MUSCLE RUB) 10-15 % CREA, Apply 1 Application topically 2 (two) times a week., Disp: , Rfl:    metFORMIN (GLUCOPHAGE) 1000 MG tablet, Take 1,000 mg by mouth daily., Disp: , Rfl:    metoprolol succinate (TOPROL-XL) 100 MG 24 hr tablet, Take 1 tablet (100 mg total) by mouth daily. Take with or immediately following a meal., Disp: 90 tablet, Rfl: 3   metoprolol succinate (TOPROL-XL) 50 MG 24 hr tablet, Take 50 mg by mouth daily. Take with or immediately following a meal., Disp: , Rfl:    Potassium Chloride ER 20 MEQ TBCR, Take 1 tablet (20 mEq total) by mouth every other day., Disp: 45 tablet, Rfl: 3   pravastatin (PRAVACHOL) 20 MG tablet, Take 20 mg by mouth in the morning., Disp: , Rfl:    spironolactone (ALDACTONE) 25 MG tablet, Take 1 tablet (25 mg total) by mouth daily., Disp: 90 tablet, Rfl: 3   venlafaxine XR (EFFEXOR-XR) 75 MG 24 hr capsule, Take 75 mg by mouth in the morning, at noon, and at bedtime., Disp: , Rfl:   Past Medical History: Past Medical History:  Diagnosis Date   Coronary artery disease    Depression    Diabetes mellitus    Fibromyalgia  Goiter    Hypertension    MI (myocardial infarction) (HCC)    Osteoarthritis    Pneumonia    10+ years ago.    Tobacco Use: Social History   Tobacco Use  Smoking Status Former   Current packs/day: 0.00   Types: Cigarettes   Quit date: 07/07/2018   Years since quitting: 4.1  Smokeless Tobacco Never    Labs: Review Flowsheet  More data exists      Latest Ref Rng & Units 03/21/2009 07/10/2009 05/02/2020 07/20/2021 07/21/2021  Labs for ITP Cardiac and Pulmonary Rehab  Cholestrol 0 - 200 mg/dL 782  - - - -  LDL (calc) 0 - 99 mg/dL 63  - - - -  HDL-C >95 mg/dL 44  - - - -  Trlycerides <150 mg/dL 77  - - - -  Hemoglobin A1c 4.8 - 5.6 % - - 6.8  6.7  -   PH, Arterial 7.35 - 7.45 - - - - 7.427   PCO2 arterial 32 - 48 mmHg - - - - 41.6   Bicarbonate 20.0 - 28.0 mmol/L - - - - 27.4  29.3  28.5   TCO2 22 - 32 mmol/L - 31  - - 29  31  30    O2 Saturation % - - - - 96  56  58     Details       Multiple values from one day are sorted in reverse-chronological order         Capillary Blood Glucose: Lab Results  Component Value Date   GLUCAP 109 (H) 09/05/2022   GLUCAP 144 (H) 09/05/2022   GLUCAP 125 (H) 08/28/2022   GLUCAP 112 (H) 12/14/2021   GLUCAP 89 07/23/2021     Exercise Target Goals: Exercise Program Goal: Individual exercise prescription set using results from initial 6 min walk test and THRR while considering  patient's activity barriers and safety.   Exercise Prescription Goal: Initial exercise prescription builds to 30-45 minutes a day of aerobic activity, 2-3 days per week.  Home exercise guidelines will be given to patient during program as part of exercise prescription that the participant will acknowledge.  Activity Barriers & Risk Stratification:  Activity Barriers & Cardiac Risk Stratification - 08/28/22 1442       Activity Barriers & Cardiac Risk Stratification   Activity Barriers Arthritis;Other (comment);Fibromyalgia    Comments Chronic knee pain, per patient needs bilateral knee replacement. Severe OA. Has been told she has fibromyalgia.    Cardiac Risk Stratification High             6 Minute Walk:  6 Minute Walk     Row Name 08/28/22 1525         6 Minute Walk   Phase Initial     Distance 1205 feet     Walk Time 6 minutes     # of Rest Breaks 1  12-second standing rest break because nose was running     MPH 2.28     METS 2.89     RPE 7     Perceived Dyspnea  1     VO2 Peak 10.11     Symptoms Yes (comment)     Comments Mild SOB, runny nose     Resting HR 83 bpm     Resting BP 122/74     Resting Oxygen Saturation  97 %     Exercise Oxygen Saturation  during 6 min walk 98 %     Max  Ex. HR 137 bpm     Max Ex. BP 146/72     2 Minute Post BP 138/74              Oxygen Initial Assessment:   Oxygen Re-Evaluation:   Oxygen Discharge (Final Oxygen Re-Evaluation):   Initial Exercise Prescription:  Initial Exercise Prescription - 08/28/22 1600       Date of Initial Exercise RX and Referring Provider   Date 08/28/22    Referring Provider Dorthula Nettles, DO    Expected Discharge Date 11/21/22      Recumbant Bike   Level 1    Watts 25    Minutes 15    METs 2.2      NuStep   Level 1    SPM 75    Minutes 15    METs 2.2      Prescription Details   Frequency (times per week) 3    Duration Progress to 30 minutes of continuous aerobic without signs/symptoms of physical distress      Intensity   THRR 40-80% of Max Heartrate 59-118    Ratings of Perceived Exertion 11-13    Perceived Dyspnea 0-4      Progression   Progression Continue to progress workloads to maintain intensity without signs/symptoms of physical distress.      Resistance Training   Training Prescription Yes    Weight 2 lbs    Reps 10-15             Perform Capillary Blood Glucose checks as needed.  Exercise Prescription Changes:   Exercise Prescription Changes     Row Name 09/05/22 1633             Response to Exercise   Blood Pressure (Admit) 124/62       Blood Pressure (Exercise) 150/70       Blood Pressure (Exit) 104/68       Heart Rate (Admit) 85 bpm       Heart Rate (Exercise) 97 bpm       Heart Rate (Exit) 88 bpm       Rating of Perceived Exertion (Exercise) 10       Perceived Dyspnea (Exercise) 0       Symptoms none       Comments Pt first day in the Pritikin ICR program       Duration Progress to 30 minutes of  aerobic without signs/symptoms of physical distress       Intensity THRR unchanged         Progression   Progression Continue to progress workloads to maintain intensity without signs/symptoms of physical distress.       Average METs 1.4          Resistance Training   Training Prescription No         NuStep   Level 1       SPM 60       Minutes 15       METs 1.8         Recumbant Elliptical   Level 1       RPM 21       Watts 22       Minutes 15       METs 1                Exercise Comments:   Exercise Comments     Row Name 09/05/22 [phone]  Exercise Comments Pt first day in the Pritikin ICR program. Pt tolerated exercise well with an average MET level of 1.4. Pt attempted recumbent bike, but was uncomfortable for her knees, Octane worked out much better. She felt good with her new ExRx and she is off to a good start. Will continue to increase workloads as tolerated without sign or symtpom                Exercise Goals and Review:   Exercise Goals     Row Name 08/28/22 1442             Exercise Goals   Increase Physical Activity Yes       Intervention Provide advice, education, support and counseling about physical activity/exercise needs.;Develop an individualized exercise prescription for aerobic and resistive training based on initial evaluation findings, risk stratification, comorbidities and participant's personal goals.       Expected Outcomes Short Term: Attend rehab on a regular basis to increase amount of physical activity.;Long Term: Add in home exercise to make exercise part of routine and to increase amount of physical activity.;Long Term: Exercising regularly at least 3-5 days a week.       Increase Strength and Stamina Yes       Intervention Provide advice, education, support and counseling about physical activity/exercise needs.;Develop an individualized exercise prescription for aerobic and resistive training based on initial evaluation findings, risk stratification, comorbidities and participant's personal goals.       Expected Outcomes Long Term: Improve cardiorespiratory fitness, muscular endurance and strength as measured by increased METs and functional capacity  ( );Short Term: Perform resistance training exercises routinely during rehab and add in resistance training at home;Short Term: Increase workloads from initial exercise prescription for resistance, speed, and METs.       Able to understand and use rate of perceived exertion (RPE) scale Yes       Intervention Provide education and explanation on how to use RPE scale       Expected Outcomes Short Term: Able to use RPE daily in rehab to express subjective intensity level;Long Term:  Able to use RPE to guide intensity level when exercising independently       Knowledge and understanding of Target Heart Rate Range (THRR) Yes       Intervention Provide education and explanation of THRR including how the numbers were predicted and where they are located for reference       Expected Outcomes Short Term: Able to state/look up THRR;Long Term: Able to use THRR to govern intensity when exercising independently;Short Term: Able to use daily as guideline for intensity in rehab       Able to check pulse independently Yes       Intervention Provide education and demonstration on how to check pulse in carotid and radial arteries.;Review the importance of being able to check your own pulse for safety during independent exercise       Expected Outcomes Short Term: Able to explain why pulse checking is important during independent exercise;Long Term: Able to check pulse independently and accurately       Understanding of Exercise Prescription Yes       Intervention Provide education, explanation, and written materials on patient's individual exercise prescription       Expected Outcomes Short Term: Able to explain program exercise prescription;Long Term: Able to explain home exercise prescription to exercise independently                Exercise  Goals Re-Evaluation :  Exercise Goals Re-Evaluation     Row Name 09/05/22 1638             Exercise Goal Re-Evaluation   Exercise Goals Review Increase Physical  Activity;Understanding of Exercise Prescription;Increase Strength and Stamina;Knowledge and understanding of Target Heart Rate Range (THRR);Able to understand and use rate of perceived exertion (RPE) scale       Comments Pt first day in the Pritikin ICR program. Pt tolerated exercise well with an average MET level of 1.4. Pt attempted recumbent bike, but was uncomfortable for her knees, Octane worked out much better. She felt good with her new ExRx and she is off to a good start       Expected Outcomes Will continue to increase workloads as tolerated without sign or symtpom                Discharge Exercise Prescription (Final Exercise Prescription Changes):  Exercise Prescription Changes - 09/05/22 1633       Response to Exercise   Blood Pressure (Admit) 124/62    Blood Pressure (Exercise) 150/70    Blood Pressure (Exit) 104/68    Heart Rate (Admit) 85 bpm    Heart Rate (Exercise) 97 bpm    Heart Rate (Exit) 88 bpm    Rating of Perceived Exertion (Exercise) 10    Perceived Dyspnea (Exercise) 0    Symptoms none    Comments Pt first day in the Pritikin ICR program    Duration Progress to 30 minutes of  aerobic without signs/symptoms of physical distress    Intensity THRR unchanged      Progression   Progression Continue to progress workloads to maintain intensity without signs/symptoms of physical distress.    Average METs 1.4      Resistance Training   Training Prescription No      NuStep   Level 1    SPM 60    Minutes 15    METs 1.8      Recumbant Elliptical   Level 1    RPM 21    Watts 22    Minutes 15    METs 1             Nutrition:  Target Goals: Understanding of nutrition guidelines, daily intake of sodium 1500mg , cholesterol 200mg , calories 30% from fat and 7% or less from saturated fats, daily to have 5 or more servings of fruits and vegetables.  Biometrics:  Pre Biometrics - 08/28/22 1311       Pre Biometrics   Waist Circumference 41 inches     Hip Circumference 45.75 inches    Waist to Hip Ratio 0.9 %    Triceps Skinfold 23 mm    % Body Fat 41.5 %    Grip Strength 28 kg    Flexibility 11.5 in    Single Leg Stand 9.18 seconds              Nutrition Therapy Plan and Nutrition Goals:   Nutrition Assessments:  MEDIFICTS Score Key: ?70 Need to make dietary changes  40-70 Heart Healthy Diet ? 40 Therapeutic Level Cholesterol Diet    Picture Your Plate Scores: <81 Unhealthy dietary pattern with much room for improvement. 41-50 Dietary pattern unlikely to meet recommendations for good health and room for improvement. 51-60 More healthful dietary pattern, with some room for improvement.  >60 Healthy dietary pattern, although there may be some specific behaviors that could be improved.    Nutrition Goals Re-Evaluation:  Nutrition Goals Re-Evaluation:   Nutrition Goals Discharge (Final Nutrition Goals Re-Evaluation):   Psychosocial: Target Goals: Acknowledge presence or absence of significant depression and/or stress, maximize coping skills, provide positive support system. Participant is able to verbalize types and ability to use techniques and skills needed for reducing stress and depression.  Initial Review & Psychosocial Screening:  Initial Psych Review & Screening - 08/28/22 1421       Initial Review   Current issues with Current Psychotropic Meds;Current Stress Concerns;Current Depression;History of Depression    Source of Stress Concerns Chronic Illness;Occupation;Unable to participate in former interests or hobbies;Unable to perform yard/household activities    Comments Stacey Fletcher has PTSD from the murder of her son when he was 57 years old. Stacey Fletcher is taking an antidepressant for PTSD. Stacey Fletcher says she is not interested  in counseling at this time.      Family Dynamics   Good Support System? Yes   Stacey Fletcher lives with her daughter and grandchldren. Stacey Fletcher also has a son who she has for support      Barriers   Psychosocial barriers to participate in program The patient should benefit from training in stress management and relaxation.      Screening Interventions   Interventions Encouraged to exercise    Expected Outcomes Long Term Goal: Stressors or current issues are controlled or eliminated.;Short Term goal: Identification and review with participant of any Quality of Life or Depression concerns found by scoring the questionnaire.;Long Term goal: The participant improves quality of Life and PHQ9 Scores as seen by post scores and/or verbalization of changes             Quality of Life Scores:  Quality of Life - 08/28/22 1450       Quality of Life   Select Quality of Life      Quality of Life Scores   Health/Function Pre 25.29 %    Socioeconomic Pre 22.5 %    Psych/Spiritual Pre 24.86 %    Family Pre 28.5 %    GLOBAL Pre 25.06 %            Scores of 19 and below usually indicate a poorer quality of life in these areas.  A difference of  2-3 points is a clinically meaningful difference.  A difference of 2-3 points in the total score of the Quality of Life Index has been associated with significant improvement in overall quality of life, self-image, physical symptoms, and general health in studies assessing change in quality of life.  PHQ-9: Review Flowsheet       08/29/2022  Depression screen PHQ 2/9  Decreased Interest 1  Down, Depressed, Hopeless 1  PHQ - 2 Score 2  Altered sleeping 2  Tired, decreased energy 1  Change in appetite 1  Feeling bad or failure about yourself  0  Trouble concentrating 0  Moving slowly or fidgety/restless 0  Suicidal thoughts 0  PHQ-9 Score 6  Difficult doing work/chores Not difficult at all    Details           Interpretation of Total Score  Total Score Depression Severity:  1-4 = Minimal depression, 5-9 = Mild depression, 10-14 = Moderate depression, 15-19 = Moderately severe depression, 20-27 = Severe depression    Psychosocial Evaluation and Intervention:   Psychosocial Re-Evaluation:   Psychosocial Discharge (Final Psychosocial Re-Evaluation):   Vocational Rehabilitation: Provide vocational rehab assistance to qualifying candidates.   Vocational Rehab Evaluation & Intervention:  Vocational Rehab - 08/28/22 1451  Initial Vocational Rehab Evaluation & Intervention   Assessment shows need for Vocational Rehabilitation No   Patient  says she may be interested in vocational rehab. Given packet to review.            Education: Education Goals: Education classes will be provided on a weekly basis, covering required topics. Participant will state understanding/return demonstration of topics presented.    Education     Row Name 09/05/22 1600     Education   Cardiac Education Topics Pritikin   Customer service manager   Weekly Topic International [employer]isine- Spotlight on the [employer] Zones   Instruction Review Code 1- Verbalizes Understanding   Class Start Time 1400   Class Stop Time 1448   Class Time Calculation (min) 48 min            Core Videos: Exercise    Move It!  Clinical staff conducted group or individual video education with verbal and written material and guidebook.  Patient learns the recommended Pritikin exercise program. Exercise with the goal of living a long, healthy life. Some of the health benefits of exercise include controlled diabetes, healthier blood pressure levels, improved cholesterol levels, improved heart and lung capacity, improved sleep, and better body composition. Everyone should speak with their doctor before starting or changing an exercise routine.  Biomechanical Limitations Clinical staff conducted group or individual video education with verbal and written material and guidebook.  Patient learns how biomechanical limitations can impact exercise and how we can mitigate and possibly overcome limitations  to have an impactful and balanced exercise routine.  Body Composition Clinical staff conducted group or individual video education with verbal and written material and guidebook.  Patient learns that body composition (ratio of muscle mass to fat mass) is a key component to assessing overall fitness, rather than body weight alone. Increased fat mass, especially visceral belly fat, can put [place] at increased risk for metabolic syndrome, type 2 diabetes, heart disease, and even death. It is recommended to combine diet and exercise (cardiovascular and resistance training) to improve your body composition. Seek guidance from your physician and exercise physiologist before implementing an exercise routine.  Exercise Action Plan Clinical staff conducted group or individual video education with verbal and written material and guidebook.  Patient learns the recommended strategies to achieve and enjoy long-term exercise adherence, including variety, self-motivation, self-efficacy, and positive decision making. Benefits of exercise include fitness, good health, weight management, more energy, better sleep, less stress, and overall well-being.  Medical   Heart Disease Risk Reduction Clinical staff conducted group or individual video education with verbal and written material and guidebook.  Patient learns our heart is our most vital organ as it circulates oxygen, nutrients, white blood cells, and hormones throughout the entire body, and carries waste away. Data supports a plant-based eating plan like the Pritikin Program for its effectiveness in slowing progression of and reversing heart disease. The video provides a number of recommendations to address heart disease.   Metabolic Syndrome and Belly Fat  Clinical staff conducted group or individual video education with verbal and written material and guidebook.  Patient learns what metabolic syndrome is, how it leads to heart disease, and how one can reverse it and  keep it from coming back. You have metabolic syndrome if you have 3 of the following 5 criteria: abdominal obesity, high blood pressure, high triglycerides, low HDL cholesterol, and high blood sugar.  Hypertension and Heart  Disease Clinical staff conducted group or individual video education with verbal and written material and guidebook.  Patient learns that high blood pressure, or hypertension, is very common in the [place]. Hypertension is largely due to excessive salt intake, but other important risk factors include being overweight, physical inactivity, drinking too much alcohol, smoking, and not eating enough potassium from fruits and vegetables. High blood pressure is a leading risk factor for heart attack, stroke, congestive heart failure, dementia, kidney failure, and premature death. Long-term effects of excessive salt intake include stiffening of the arteries and thickening of heart muscle and organ damage. Recommendations include ways to reduce hypertension and the risk of heart disease.  Diseases of Our Time - Focusing on Diabetes Clinical staff conducted group or individual video education with verbal and written material and guidebook.  Patient learns why the best way to stop diseases of our time is prevention, through food and other lifestyle changes. Medicine (such as prescription pills and surgeries) is often only a Band-Aid on the problem, not a long-term solution. Most common diseases of our time include obesity, type 2 diabetes, hypertension, heart disease, and cancer. The Pritikin Program is recommended and has been proven to help reduce, reverse, and/or prevent the damaging effects of metabolic syndrome.  Nutrition   Overview of the Pritikin Eating Plan  Clinical staff conducted group or individual video education with verbal and written material and guidebook.  Patient learns about the Pritikin Eating Plan for disease risk reduction. The Pritikin Eating Plan emphasizes a  wide variety of unrefined, minimally-processed carbohydrates, like fruits, vegetables, whole grains, and legumes. Go, Caution, and Stop food choices are explained. Plant-based and lean animal proteins are emphasized. Rationale provided for low sodium intake for blood pressure control, low added sugars for blood sugar stabilization, and low added fats and oils for coronary artery disease risk reduction and weight management.  Calorie Density  Clinical staff conducted group or individual video education with verbal and written material and guidebook.  Patient learns about calorie density and how it impacts the Pritikin Eating Plan. Knowing the characteristics of the food you choose will help you decide whether those foods will lead to weight gain or weight loss, and whether you want to consume more or less of them. Weight loss is usually a side effect of the Pritikin Eating Plan because of its focus on low calorie-dense foods.  Label Reading  Clinical staff conducted group or individual video education with verbal and written material and guidebook.  Patient learns about the Pritikin recommended label reading guidelines and corresponding recommendations regarding calorie density, added sugars, sodium content, and whole grains.  Dining Out - Part 1  Clinical staff conducted group or individual video education with verbal and written material and guidebook.  Patient learns that restaurant meals can be sabotaging because they can be so high in calories, fat, sodium, and/or sugar. Patient learns recommended strategies on how to positively address this and avoid unhealthy pitfalls.  Facts on Fats  Clinical staff conducted group or individual video education with verbal and written material and guidebook.  Patient learns that lifestyle modifications can be just as effective, if not more so, as many medications for lowering your risk of heart disease. A Pritikin lifestyle can help to reduce your risk of  inflammation and atherosclerosis (cholesterol build-up, or plaque, in the artery walls). Lifestyle interventions such as dietary choices and physical activity address the cause of atherosclerosis. A review of the types of fats and their impact on  blood cholesterol levels, along with dietary recommendations to reduce fat intake is also included.  Nutrition Action Plan  Clinical staff conducted group or individual video education with verbal and written material and guidebook.  Patient learns how to incorporate Pritikin recommendations into their lifestyle. Recommendations include planning and keeping personal health goals in mind as an important part of their success.  Healthy Mind-Set    Healthy Minds, Bodies, Hearts  Clinical staff conducted group or individual video education with verbal and written material and guidebook.  Patient learns how to identify when they are stressed. Video will discuss the impact of that stress, as well as the many benefits of stress management. Patient will also be introduced to stress management techniques. The way we think, act, and feel has an impact on our hearts.  How Our Thoughts Can Heal Our Hearts  Clinical staff conducted group or individual video education with verbal and written material and guidebook.  Patient learns that negative thoughts can cause depression and anxiety. This can result in negative lifestyle behavior and serious health problems. Cognitive behavioral therapy is an effective method to help control our thoughts in order to change and improve our emotional outlook.  Additional Videos:  Exercise    Improving Performance  Clinical staff conducted group or individual video education with verbal and written material and guidebook.  Patient learns to use a non-linear approach by alternating intensity levels and lengths of time spent exercising to help burn more calories and lose more body fat. Cardiovascular exercise helps improve heart health,  metabolism, hormonal balance, blood sugar control, and recovery from fatigue. Resistance training improves strength, endurance, balance, coordination, reaction time, metabolism, and muscle mass. Flexibility exercise improves circulation, posture, and balance. Seek guidance from your physician and exercise physiologist before implementing an exercise routine and learn your capabilities and proper form for all exercise.  Introduction to Yoga  Clinical staff conducted group or individual video education with verbal and written material and guidebook.  Patient learns about yoga, a discipline of the coming together of mind, breath, and body. The benefits of yoga include improved flexibility, improved range of motion, better posture and core strength, increased lung function, weight loss, and positive self-image. Yoga's heart health benefits include lowered blood pressure, healthier heart rate, decreased cholesterol and triglyceride levels, improved immune function, and reduced stress. Seek guidance from your physician and exercise physiologist before implementing an exercise routine and learn your capabilities and proper form for all exercise.  Medical   Aging: Enhancing Your Quality of Life  Clinical staff conducted group or individual video education with verbal and written material and guidebook.  Patient learns key strategies and recommendations to stay in good physical health and enhance quality of life, such as prevention strategies, having an advocate, securing a Health Care Proxy and Power of Attorney, and keeping a list of medications and system for tracking them. It also discusses how to avoid risk for bone loss.  Biology of Weight Control  Clinical staff conducted group or individual video education with verbal and written material and guidebook.  Patient learns that weight gain occurs because we consume more calories than we burn (eating more, moving less). Even if your body weight is normal, you  may have higher ratios of fat compared to muscle mass. Too much body fat puts you at increased risk for cardiovascular disease, heart attack, stroke, type 2 diabetes, and obesity-related cancers. In addition to exercise, following the Pritikin Eating Plan can help reduce your risk.  Decoding  Lab Results  Clinical staff conducted group or individual video education with verbal and written material and guidebook.  Patient learns that lab test reflects one measurement whose values change over time and are influenced by many factors, including medication, stress, sleep, exercise, food, hydration, pre-existing medical conditions, and more. It is recommended to use the knowledge from this video to become more involved with your lab results and evaluate your numbers to speak with your doctor.   Diseases of Our Time - Overview  Clinical staff conducted group or individual video education with verbal and written material and guidebook.  Patient learns that according to the CDC, 50% to 70% of chronic diseases (such as obesity, type 2 diabetes, elevated lipids, hypertension, and heart disease) are avoidable through lifestyle improvements including healthier food choices, listening to satiety cues, and increased physical activity.  Sleep Disorders Clinical staff conducted group or individual video education with verbal and written material and guidebook.  Patient learns how good quality and duration of sleep are important to overall health and well-being. Patient also learns about sleep disorders and how they impact health along with recommendations to address them, including discussing with a physician.  Nutrition  Dining Out - Part 2 Clinical staff conducted group or individual video education with verbal and written material and guidebook.  Patient learns how to plan ahead and communicate in order to maximize their dining experience in a healthy and nutritious manner. Included are recommended food choices  based on the type of restaurant the patient is visiting.   Fueling a Banker conducted group or individual video education with verbal and written material and guidebook.  There is a strong connection between our food choices and our health. Diseases like obesity and type 2 diabetes are very prevalent and are in large-part due to lifestyle choices. The Pritikin Eating Plan provides plenty of food and hunger-curbing satisfaction. It is easy to follow, affordable, and helps reduce health risks.  Menu Workshop  Clinical staff conducted group or individual video education with verbal and written material and guidebook.  Patient learns that restaurant meals can sabotage health goals because they are often packed with calories, fat, sodium, and sugar. Recommendations include strategies to plan ahead and to communicate with the manager, chef, or server to help order a healthier meal.  Planning Your Eating Strategy  Clinical staff conducted group or individual video education with verbal and written material and guidebook.  Patient learns about the Pritikin Eating Plan and its benefit of reducing the risk of disease. The Pritikin Eating Plan does not focus on calories. Instead, it emphasizes high-quality, nutrient-rich foods. By knowing the characteristics of the foods, we choose, we can determine their calorie density and make informed decisions.  Targeting Your Nutrition Priorities  Clinical staff conducted group or individual video education with verbal and written material and guidebook.  Patient learns that lifestyle habits have a tremendous impact on disease risk and progression. This video provides eating and physical activity recommendations based on your personal health goals, such as reducing LDL cholesterol, losing weight, preventing or controlling type 2 diabetes, and reducing high blood pressure.  Vitamins and Minerals  Clinical staff conducted group or individual video  education with verbal and written material and guidebook.  Patient learns different ways to obtain key vitamins and minerals, including through a recommended healthy diet. It is important to discuss all supplements you take with your doctor.   Healthy Mind-Set    Smoking Cessation  Clinical staff  conducted group or individual video education with verbal and written material and guidebook.  Patient learns that cigarette smoking and tobacco addiction pose a serious health risk which affects millions of people. Stopping smoking will significantly reduce the risk of heart disease, lung disease, and many forms of cancer. Recommended strategies for quitting are covered, including working with your doctor to develop a successful plan.  Culinary   Becoming a Set designer conducted group or individual video education with verbal and written material and guidebook.  Patient learns that cooking at home can be healthy, cost-effective, quick, and puts them in control. Keys to cooking healthy recipes will include looking at your recipe, assessing your equipment needs, planning ahead, making it simple, choosing cost-effective seasonal ingredients, and limiting the use of added fats, salts, and sugars.  Cooking - Breakfast and Snacks  Clinical staff conducted group or individual video education with verbal and written material and guidebook.  Patient learns how important breakfast is to satiety and nutrition through the entire day. Recommendations include key foods to eat during breakfast to help stabilize blood sugar levels and to prevent overeating at meals later in the day. Planning ahead is also a key component.  Cooking - Educational psychologist conducted group or individual video education with verbal and written material and guidebook.  Patient learns eating strategies to improve overall health, including an approach to cook more at home. Recommendations include thinking of  animal protein as a side on your plate rather than center stage and focusing instead on lower calorie dense options like vegetables, fruits, whole grains, and plant-based proteins, such as beans. Making sauces in large quantities to freeze for later and leaving the skin on your vegetables are also recommended to maximize your experience.  Cooking - Healthy Salads and Dressing Clinical staff conducted group or individual video education with verbal and written material and guidebook.  Patient learns that vegetables, fruits, whole grains, and legumes are the foundations of the Pritikin Eating Plan. Recommendations include how to incorporate each of these in flavorful and healthy salads, and how to create homemade salad dressings. Proper handling of ingredients is also covered. Cooking - Soups and [employer] - Soups and Desserts Clinical staff conducted group or individual video education with verbal and written material and guidebook.  Patient learns that Pritikin soups and desserts make for easy, nutritious, and delicious snacks and meal components that are low in sodium, fat, sugar, and calorie density, while high in vitamins, minerals, and filling fiber. Recommendations include simple and healthy ideas for soups and desserts.   Overview     The Pritikin Solution Program Overview Clinical staff conducted group or individual video education with verbal and written material and guidebook.  Patient learns that the results of the Pritikin Program have been documented in more than 100 articles published in peer-reviewed journals, and the benefits include reducing risk factors for (and, in some cases, even reversing) high cholesterol, high blood pressure, type 2 diabetes, obesity, and more! An overview of the three key pillars of the Pritikin Program will be covered: eating well, doing regular exercise, and having a healthy mind-set.  WORKSHOPS  Exercise: Exercise Basics: Building Your Action  Plan Clinical staff led group instruction and group discussion with PowerPoint presentation and patient guidebook. To enhance the learning environment the use of posters, models and videos may be added. At the conclusion of this workshop, patients will comprehend the difference between physical activity and exercise, as  well as the benefits of incorporating both, into their routine. Patients will understand the FITT (Frequency, Intensity, Time, and Type) principle and how to use it to build an exercise action plan. In addition, safety concerns and other considerations for exercise and cardiac rehab will be addressed by the presenter. The purpose of this lesson is to promote a comprehensive and effective weekly exercise routine in order to improve patients' overall level of fitness.   Managing Heart Disease: Your Path to a Healthier Heart Clinical staff led group instruction and group discussion with PowerPoint presentation and patient guidebook. To enhance the learning environment the use of posters, models and videos may be added.At the conclusion of this workshop, patients will understand the anatomy and physiology of the heart. Additionally, they will understand how Pritikin's three pillars impact the risk factors, the progression, and the management of heart disease.  The purpose of this lesson is to provide a high-level overview of the heart, heart disease, and how the Pritikin lifestyle positively impacts risk factors.  Exercise Biomechanics Clinical staff led group instruction and group discussion with PowerPoint presentation and patient guidebook. To enhance the learning environment the use of posters, models and videos may be added. Patients will learn how the structural parts of their bodies function and how these functions impact their daily activities, movement, and exercise. Patients will learn how to promote a neutral spine, learn how to manage pain, and identify ways to improve  their physical movement in order to promote healthy living. The purpose of this lesson is to expose patients to common physical limitations that impact physical activity. Participants will learn practical ways to adapt and manage aches and pains, and to minimize their effect on regular exercise. Patients will learn how to maintain good posture while sitting, walking, and lifting.  Balance Training and Fall Prevention  Clinical staff led group instruction and group discussion with PowerPoint presentation and patient guidebook. To enhance the learning environment the use of posters, models and videos may be added. At the conclusion of this workshop, patients will understand the importance of their sensorimotor skills (vision, proprioception, and the vestibular system) in maintaining their ability to balance as they age. Patients will apply a variety of balancing exercises that are appropriate for their current level of function. Patients will understand the common causes for poor balance, possible solutions to these problems, and ways to modify their physical environment in order to minimize their fall risk. The purpose of this lesson is to teach patients about the importance of maintaining balance as they age and ways to minimize their risk of falling.  WORKSHOPS   Nutrition:  Fueling a Ship broker led group instruction and group discussion with PowerPoint presentation and patient guidebook. To enhance the learning environment the use of posters, models and videos may be added. Patients will review the foundational principles of the Pritikin Eating Plan and understand what constitutes a serving size in each of the food groups. Patients will also learn Pritikin-friendly foods that are better choices when away from home and review make-ahead meal and snack options. Calorie density will be reviewed and applied to three nutrition priorities: weight maintenance, weight loss, and weight  gain. The purpose of this lesson is to reinforce (in a group setting) the key concepts around what patients are recommended to eat and how to apply these guidelines when away from home by planning and selecting Pritikin-friendly options. Patients will understand how calorie density may be adjusted for different weight management goals.  Mindful Eating  Clinical staff led group instruction and group discussion with PowerPoint presentation and patient guidebook. To enhance the learning environment the use of posters, models and videos may be added. Patients will briefly review the concepts of the Pritikin Eating Plan and the importance of low-calorie dense foods. The concept of mindful eating will be introduced as well as the importance of paying attention to internal hunger signals. Triggers for non-hunger eating and techniques for dealing with triggers will be explored. The purpose of this lesson is to provide patients with the opportunity to review the basic principles of the Pritikin Eating Plan, discuss the value of eating mindfully and how to measure internal cues of hunger and fullness using the Hunger Scale. Patients will also discuss reasons for non-hunger eating and learn strategies to use for controlling emotional eating.  Targeting Your Nutrition Priorities Clinical staff led group instruction and group discussion with PowerPoint presentation and patient guidebook. To enhance the learning environment the use of posters, models and videos may be added. Patients will learn how to determine their genetic susceptibility to disease by reviewing their family history. Patients will gain insight into the importance of diet as part of an overall healthy lifestyle in mitigating the impact of genetics and other environmental insults. The purpose of this lesson is to provide patients with the opportunity to assess their personal nutrition priorities by looking at their family history, their own health history  and current risk factors. Patients will also be able to discuss ways of prioritizing and modifying the Pritikin Eating Plan for their highest risk areas  Menu  Clinical staff led group instruction and group discussion with PowerPoint presentation and patient guidebook. To enhance the learning environment the use of posters, models and videos may be added. Using menus brought in from [employer], or printed from [employer], patients will apply the Pritikin dining out guidelines that were presented in the [employer] video. Patients will also be able to practice these guidelines in a variety of provided scenarios. The purpose of this lesson is to provide patients with the opportunity to practice hands-on learning of the Pritikin Dining Out guidelines with actual menus and practice scenarios.  Label Reading Clinical staff led group instruction and group discussion with PowerPoint presentation and patient guidebook. To enhance the learning environment the use of posters, models and videos may be added. Patients will review and discuss the Pritikin label reading guidelines presented in Pritikin's Label Reading Educational series video. Using fool labels brought in from local grocery stores and markets, patients will apply the label reading guidelines and determine if the packaged food meet the Pritikin guidelines. The purpose of this lesson is to provide patients with the opportunity to review, discuss, and practice hands-on learning of the Pritikin Label Reading guidelines with actual packaged food labels. Cooking School  Pritikin's [employer] are designed to teach patients ways to prepare quick, simple, and affordable recipes at home. The importance of nutrition's role in chronic disease risk reduction is reflected in its emphasis in the overall Pritikin program. By learning how to prepare essential core Pritikin Eating Plan recipes, patients will increase control over  what they eat; be able to customize the flavor of foods without the use of added salt, sugar, or fat; and improve the quality of the food they consume. By learning a set of core recipes which are easily assembled, quickly prepared, and affordable, patients are more likely to prepare more healthy foods at home. These  workshops focus on convenient breakfasts, simple entres, side dishes, and desserts which can be prepared with minimal effort and are consistent with nutrition recommendations for cardiovascular risk reduction. Cooking [employer] are taught by a Armed forces logistics/support/administrative officer (RD) who has been trained by the [employer]. The chef or RD has a clear understanding of the importance of minimizing - if not completely eliminating - added fat, sugar, and sodium in recipes. Throughout the series of Cooking School Workshop sessions, patients will learn about healthy ingredients and efficient methods of cooking to build confidence in their capability to prepare    Cooking School weekly topics:  Adding Flavor- Sodium-Free  Fast and Healthy Breakfasts  Powerhouse Plant-Based Proteins  Satisfying Salads and Dressings  Simple Sides and Sauces  International Cuisine-Spotlight on the [employer] Zones  Delicious Desserts  Savory Soups  [employer] - Meals in a Astronomer Appetizers and Snacks  Comforting Weekend Breakfasts  One-Pot Wonders   Fast Evening Meals  Landscape architect Your Pritikin Plate  WORKSHOPS   Healthy Mindset (Psychosocial):  Focused Goals, Sustainable Changes Clinical staff led group instruction and group discussion with PowerPoint presentation and patient guidebook. To enhance the learning environment the use of posters, models and videos may be added. Patients will be able to apply effective goal setting strategies to establish at least one personal goal, and then take consistent, meaningful action toward that goal. They will learn to  identify common barriers to achieving personal goals and develop strategies to overcome them. Patients will also gain an understanding of how our mind-set can impact our ability to achieve goals and the importance of cultivating a positive and growth-oriented mind-set. The purpose of this lesson is to provide patients with a deeper understanding of how to set and achieve personal goals, as well as the tools and strategies needed to overcome common obstacles which may arise along the way.  From Head to Heart: The Power of a Healthy Outlook  Clinical staff led group instruction and group discussion with PowerPoint presentation and patient guidebook. To enhance the learning environment the use of posters, models and videos may be added. Patients will be able to recognize and describe the impact of emotions and mood on physical health. They will discover the importance of self-care and explore self-care practices which may work for them. Patients will also learn how to utilize the 4 C's to cultivate a healthier outlook and better manage stress and challenges. The purpose of this lesson is to demonstrate to patients how a healthy outlook is an essential part of maintaining good health, especially as they continue their cardiac rehab journey.  Healthy Sleep for a Healthy Heart Clinical staff led group instruction and group discussion with PowerPoint presentation and patient guidebook. To enhance the learning environment the use of posters, models and videos may be added. At the conclusion of this workshop, patients will be able to demonstrate knowledge of the importance of sleep to overall health, well-being, and quality of life. They will understand the symptoms of, and treatments for, common sleep disorders. Patients will also be able to identify daytime and nighttime behaviors which impact sleep, and they will be able to apply these tools to help manage sleep-related challenges. The purpose of this lesson is to  provide patients with a general overview of sleep and outline the importance of quality sleep. Patients will learn about a few of the most common sleep disorders. Patients will also be introduced to the concept  of "sleep hygiene," and discover ways to self-manage certain sleeping problems through simple daily behavior changes. Finally, the workshop will motivate patients by clarifying the links between quality sleep and their goals of heart-healthy living.   Recognizing and Reducing Stress Clinical staff led group instruction and group discussion with PowerPoint presentation and patient guidebook. To enhance the learning environment the use of posters, models and videos may be added. At the conclusion of this workshop, patients will be able to understand the types of stress reactions, differentiate between acute and chronic stress, and recognize the impact that chronic stress has on their health. They will also be able to apply different coping mechanisms, such as reframing negative self-talk. Patients will have the opportunity to practice a variety of stress management techniques, such as deep abdominal breathing, progressive muscle relaxation, and/or guided imagery.  The purpose of this lesson is to educate patients on the role of stress in their lives and to provide healthy techniques for coping with it.  Learning Barriers/Preferences:  Learning Barriers/Preferences - 08/28/22 1612       Learning Barriers/Preferences   Learning Barriers Sight   wears glasses.   Learning Preferences Audio;Skilled Demonstration             Education Topics:  Knowledge Questionnaire Score:  Knowledge Questionnaire Score - 08/28/22 1438       Knowledge Questionnaire Score   Pre Score 22/24             Core Components/Risk Factors/Patient Goals at Admission:  Personal Goals and Risk Factors at Admission - 08/28/22 1442       Core Components/Risk Factors/Patient Goals on Admission    Weight  Management Yes;Obesity;Weight Loss    Intervention Weight Management/Obesity: Establish reasonable short term and long term weight goals.;Obesity: Provide education and appropriate resources to help participant work on and attain dietary goals.    Admit Weight 209 lb 10.5 oz (95.1 kg)    Expected Outcomes Short Term: Continue to assess and modify interventions until short term weight is achieved;Long Term: Adherence to nutrition and physical activity/exercise program aimed toward attainment of established weight goal;Weight Loss: Understanding of general recommendations for a balanced deficit meal plan, which promotes 1-2 lb weight loss per week and includes a negative energy balance of [phone] kcal/d    Diabetes Yes    Intervention Provide education about signs/symptoms and action to take for hypo/hyperglycemia.;Provide education about proper nutrition, including hydration, and aerobic/resistive exercise prescription along with prescribed medications to achieve blood glucose in normal ranges: Fasting glucose 65-99 mg/dL    Expected Outcomes Short Term: Participant verbalizes understanding of the signs/symptoms and immediate care of hyper/hypoglycemia, proper foot care and importance of medication, aerobic/resistive exercise and nutrition plan for blood glucose control.;Long Term: Attainment of HbA1C < 7%.    Heart Failure Yes    Intervention Provide a combined exercise and nutrition program that is supplemented with education, support and counseling about heart failure. Directed toward relieving symptoms such as shortness of breath, decreased exercise tolerance, and extremity edema.    Expected Outcomes Improve functional capacity of life;Short term: Attendance in program 2-3 days a week with increased exercise capacity. Reported lower sodium intake. Reported increased fruit and vegetable intake. Reports medication compliance.;Short term: Daily weights obtained and reported for increase. Utilizing  diuretic protocols set by physician.;Long term: Adoption of self-care skills and reduction of barriers for early signs and symptoms recognition and intervention leading to self-care maintenance.    Hypertension Yes    Intervention Provide  education on lifestyle modifcations including regular physical activity/exercise, weight management, moderate sodium restriction and increased consumption of fresh fruit, vegetables, and low fat dairy, alcohol moderation, and smoking cessation.;Monitor prescription use compliance.    Expected Outcomes Short Term: Continued assessment and intervention until BP is < 140/42mm HG in hypertensive participants. < 130/44mm HG in hypertensive participants with diabetes, heart failure or chronic kidney disease.;Long Term: Maintenance of blood pressure at goal levels.    Lipids Yes    Intervention Provide education and support for participant on nutrition & aerobic/resistive exercise along with prescribed medications to achieve LDL 70mg , HDL >40mg .    Expected Outcomes Short Term: Participant states understanding of desired cholesterol values and is compliant with medications prescribed. Participant is following exercise prescription and nutrition guidelines.;Long Term: Cholesterol controlled with medications as prescribed, with individualized exercise RX and with personalized nutrition plan. Value goals: LDL < 70mg , HDL > 40 mg.    Stress Yes    Intervention Offer individual and/or small group education and counseling on adjustment to heart disease, stress management and health-related lifestyle change. Teach and support self-help strategies.    Expected Outcomes Short Term: Participant demonstrates changes in health-related behavior, relaxation and other stress management skills, ability to obtain effective social support, and compliance with psychotropic medications if prescribed.;Long Term: Emotional wellbeing is indicated by absence of clinically significant psychosocial  distress or social isolation.             Core Components/Risk Factors/Patient Goals Review:   Goals and Risk Factor Review     Row Name 09/05/22 1706             Core Components/Risk Factors/Patient Goals Review   Personal Goals Review Weight Management/Obesity;Heart Failure;Stress;Hypertension;Lipids       Review Stacey Fletcher started cardiac rehab on 09/05/22 and did well with exercise. Vital signs and CBG's were stable       Expected Outcomes Stacey Fletcher will continue to participate in cardiac rehab for exercise, nutrtion and lifestyle modifcations.                Core Components/Risk Factors/Patient Goals at Discharge (Final Review):   Goals and Risk Factor Review - 09/05/22 1706       Core Components/Risk Factors/Patient Goals Review   Personal Goals Review Weight Management/Obesity;Heart Failure;Stress;Hypertension;Lipids    Review Stacey Fletcher started cardiac rehab on 09/05/22 and did well with exercise. Vital signs and CBG's were stable    Expected Outcomes Stacey Fletcher will continue to participate in cardiac rehab for exercise, nutrtion and lifestyle modifcations.             ITP Comments:  ITP Comments     Row Name 08/28/22 1311 09/05/22 1705         ITP Comments Medical Director- Dr. Armanda Magic, MD. Introduction to the [facility] / Intensive Cardiac Rehab. Reviewed initial orientation folder with patient. 30 Day ITP Review. Stacey Fletcher started cardiac rehab on 09/05/22 and did well with exercise               Comments: See ITP comments.Thayer Headings RN BSN

## 2022-09-05 NOTE — Telephone Encounter (Signed)
Spoke with Stacey Fletcher she plans to attend exercise today. Stacey Fletcher has been free of symptoms for 48 hours.Thayer Headings RN BSN

## 2022-09-05 NOTE — Telephone Encounter (Signed)
Left message to call cardiac rehab.Maria Walden Whitaker RN BSN  

## 2022-09-07 ENCOUNTER — Ambulatory Visit (INDEPENDENT_AMBULATORY_CARE_PROVIDER_SITE_OTHER): Payer: 59 | Admitting: Family Medicine

## 2022-09-07 ENCOUNTER — Encounter: Payer: Self-pay | Admitting: Family Medicine

## 2022-09-07 VITALS — BP 128/82 | HR 79 | Temp 98.0°F | Ht 69.0 in | Wt 206.0 lb

## 2022-09-07 DIAGNOSIS — E119 Type 2 diabetes mellitus without complications: Secondary | ICD-10-CM

## 2022-09-07 DIAGNOSIS — M170 Bilateral primary osteoarthritis of knee: Secondary | ICD-10-CM

## 2022-09-07 DIAGNOSIS — I5042 Chronic combined systolic (congestive) and diastolic (congestive) heart failure: Secondary | ICD-10-CM

## 2022-09-07 DIAGNOSIS — F330 Major depressive disorder, recurrent, mild: Secondary | ICD-10-CM

## 2022-09-07 DIAGNOSIS — I2510 Atherosclerotic heart disease of native coronary artery without angina pectoris: Secondary | ICD-10-CM | POA: Diagnosis not present

## 2022-09-07 DIAGNOSIS — Z7984 Long term (current) use of oral hypoglycemic drugs: Secondary | ICD-10-CM | POA: Diagnosis not present

## 2022-09-07 LAB — POCT GLYCOSYLATED HEMOGLOBIN (HGB A1C): Hemoglobin A1C: 7.4 % — AB (ref 4.0–5.6)

## 2022-09-07 NOTE — Progress Notes (Unsigned)
Assessment/Plan:   Problem List Items Addressed This Visit       Endocrine   Diabetes mellitus (HCC) - Primary   Relevant Orders   POCT glycosylated hemoglobin (Hb A1C) (Completed)    There are no discontinued medications.  No follow-ups on file.    Subjective:   Encounter date: 09/07/2022  Stacey Fletcher is a 72 y.o. female who has Chest pain; Nausea vomiting and diarrhea; Fever; HTN (hypertension); Hypothyroidism; Diabetes mellitus (HCC); ARF (acute renal failure) (HCC); Pneumonia; Osteoarthritis; MI (myocardial infarction) (HCC); Hypertension; Goiter; Fibromyalgia; Depression; Coronary artery disease; Arthritis of carpometacarpal (CMC) joint of right thumb; Pain of right hand; Acute respiratory failure with hypoxia (HCC); Elevated troponin; Acute CHF (congestive heart failure) (HCC); and Severe mitral regurgitation on their problem list..   She  has a past medical history of Coronary artery disease, Depression, Diabetes mellitus, Fibromyalgia, Goiter, Hypertension, MI (myocardial infarction) (HCC), Osteoarthritis, and Pneumonia..   She presents with chief complaint of Establish Care .     09/07/2022    1:51 PM 08/29/2022    5:05 PM  Depression screen PHQ 2/9  Decreased Interest 1 1  Down, Depressed, Hopeless 3 1  PHQ - 2 Score 4 2  Altered sleeping 3 2  Tired, decreased energy 1 1  Change in appetite 3 1  Feeling bad or failure about yourself  0 0  Trouble concentrating 0 0  Moving slowly or fidgety/restless 0 0  Suicidal thoughts 0 0  PHQ-9 Score 11 6  Difficult doing work/chores Somewhat difficult Not difficult at all      09/07/2022    1:51 PM  GAD 7 : Generalized Anxiety Score  Nervous, Anxious, on Edge 1  Control/stop worrying 3  Worry too much - different things 3  Trouble relaxing 3  Restless 0  Easily annoyed or irritable 3  Afraid - awful might happen 3  Total GAD 7 Score 16  Anxiety Difficulty Somewhat difficult     HPI:   ROS  Past  Surgical History:  Procedure Laterality Date   ABDOMINAL HYSTERECTOMY     BREAST SURGERY     BUBBLE STUDY  12/14/2021   Procedure: BUBBLE STUDY;  Surgeon: Christell Constant, MD;  Location: [facility] ENDOSCOPY;  Service: Cardiovascular;;   CARDIAC CATHETERIZATION     1995   REDUCTION MAMMAPLASTY Bilateral    RIGHT/LEFT HEART CATH AND CORONARY ANGIOGRAPHY N/A 07/21/2021   Procedure: RIGHT/LEFT HEART CATH AND CORONARY ANGIOGRAPHY;  Surgeon: Runell Gess, MD;  Location: [facility] INVASIVE CV LAB;  Service: Cardiovascular;  Laterality: N/A;   TEE WITHOUT CARDIOVERSION N/A 12/14/2021   Procedure: TRANSESOPHAGEAL ECHOCARDIOGRAM (TEE);  Surgeon: Christell Constant, MD;  Location: [facility] ENDOSCOPY;  Service: Cardiovascular;  Laterality: N/A;    Outpatient Medications Prior to Visit  Medication Sig Dispense Refill   aspirin 81 MG chewable tablet Chew 81 mg by mouth in the morning.     buPROPion (WELLBUTRIN XL) 300 MG 24 hr tablet Take 300 mg by mouth every morning.     cetirizine (ZYRTEC) 10 MG tablet Take 10 mg by mouth daily as needed for allergies.     dapagliflozin propanediol (FARXIGA) 10 MG TABS tablet Take 1 tablet (10 mg total) by mouth daily. 90 tablet 3   digoxin (LANOXIN) 0.125 MG tablet Take 1 tablet (0.125 mg total) by mouth daily. 90 tablet 3   diphenhydrAMINE (BENADRYL) 25 MG tablet Take 25 mg by mouth at bedtime.     fluticasone (FLONASE) 50 MCG/ACT nasal  spray Place 1 spray into both nostrils daily as needed for allergies or rhinitis.     furosemide (LASIX) 40 MG tablet Take 1 tablet (40 mg total) by mouth every other day. 45 tablet 3   gabapentin (NEURONTIN) 100 MG capsule TAKE 1 CAPSULE(100 MG) BY MOUTH AT BEDTIME 30 capsule 6   levothyroxine (SYNTHROID) 25 MCG tablet Take 25 mcg by mouth daily before breakfast.     losartan (COZAAR) 100 MG tablet Take 1 tablet (100 mg total) by mouth daily. 90 tablet 3   Menthol-Methyl Salicylate (MUSCLE RUB) 10-15 % CREA Apply 1 Application  topically 2 (two) times a week.     metFORMIN (GLUCOPHAGE) 1000 MG tablet Take 1,000 mg by mouth daily.     metoprolol succinate (TOPROL-XL) 100 MG 24 hr tablet Take 1 tablet (100 mg total) by mouth daily. Take with or immediately following a meal. 90 tablet 3   metoprolol succinate (TOPROL-XL) 50 MG 24 hr tablet Take 50 mg by mouth daily. Take with or immediately following a meal.     Potassium Chloride ER 20 MEQ TBCR Take 1 tablet (20 mEq total) by mouth every other day. 45 tablet 3   pravastatin (PRAVACHOL) 20 MG tablet Take 20 mg by mouth in the morning.     spironolactone (ALDACTONE) 25 MG tablet Take 1 tablet (25 mg total) by mouth daily. 90 tablet 3   venlafaxine XR (EFFEXOR-XR) 75 MG 24 hr capsule Take 75 mg by mouth in the morning, at noon, and at bedtime.     HYDROcodone-acetaminophen (NORCO/VICODIN) 5-325 MG tablet Take 1 tablet by mouth every 8 (eight) hours as needed for moderate pain. (Patient not taking: Reported on 09/07/2022)     losartan (COZAAR) 50 MG tablet Take 1 tablet (50 mg total) by mouth daily. 90 tablet 3   No facility-administered medications prior to visit.    Family History  Problem Relation Age of Onset   Hypertension Mother    Diabetes Mother    Stroke Mother    Hypertension Other    Diabetes Other     Social History   Socioeconomic History   Marital status: Divorced    Spouse name: Not on file   Number of children: 3   Years of education: 16   Highest education level: Bachelor's degree (e.g., BA, AB, BS)  Occupational History   Occupation: retired  Tobacco Use   Smoking status: Former    Current packs/day: 0.00    Types: Cigarettes    Quit date: 07/07/2018    Years since quitting: 4.1    Passive exposure: Never   Smokeless tobacco: Never  Vaping Use   Vaping status: Never Used  Substance and Sexual Activity   Alcohol use: No   Drug use: No   Sexual activity: Not Currently  Other Topics Concern   Not on file  Social History Narrative   Not  on file   Social Determinants of Health   Financial Resource Strain: Low Risk  (07/21/2021)   Overall Financial Resource Strain (CARDIA)    Difficulty of Paying Living Expenses: Not very hard  Food Insecurity: No Food Insecurity (07/21/2021)   Hunger Vital Sign    Worried About Running Out of Food in the Last Year: Never true    Ran Out of Food in the Last Year: Never true  Transportation Needs: No Transportation Needs (07/21/2021)   PRAPARE - Administrator, Civil Service (Medical): No    Lack of Transportation (Non-Medical):  No  Physical Activity: Not on file  Stress: Not on file  Social Connections: Not on file  Intimate Partner Violence: Not on file                                                                                                  Objective:  Physical Exam: BP 128/82 (BP Location: Right Arm, Patient Position: Sitting, Cuff Size: Large)   Pulse 79   Temp 98 F (36.7 C) (Temporal)   Ht 5\' 9"  (1.753 m)   Wt 206 lb (93.4 kg)   SpO2 98%   BMI 30.42 kg/m     Physical Exam  ECHOCARDIOGRAM COMPLETE  Result Date: 07/27/2022    ECHOCARDIOGRAM REPORT   Patient Name:   Alleyne Rudge Date of Exam: 07/27/2022 Medical Rec #:  [id number]        Height:       68.0 in Accession #:    [id number]       Weight:       212.0 lb Date of Birth:  08/02/1950        BSA:          2.095 m Patient Age:    71 years         BP:           118/70 mmHg Patient Gender: F                HR:           74 bpm. Exam Location:  Outpatient Procedure: 2D Echo, 3D Echo, Cardiac Doppler, Color Doppler and Strain Analysis Indications:    I50.40* Unspecified combined systolic (congestive) and diastolic                 (congestive) heart failure  History:        Patient has prior history of Echocardiogram examinations, most                 recent 02/20/2022. CHF, Previous Myocardial Infarction and CAD,                 Mitral Valve Disease, Arrythmias:Cardiac Arrest,                  Signs/Symptoms:Chest Pain; Risk Factors:Hypertension and                 Diabetes.  Sonographer:    Sheralyn Boatman RDCS Referring Phys: [place] Livings IMPRESSIONS  1. Left ventricular ejection fraction, by estimation, is 30 to 35%. Left ventricular ejection fraction by 3D volume is 31 %. The left ventricle has moderately decreased function. The left ventricle demonstrates regional wall motion abnormalities (see scoring diagram/findings for description). The left ventricular internal cavity size was severely dilated. There is moderate concentric left ventricular hypertrophy. Left ventricular diastolic parameters are consistent with Grade I diastolic dysfunction (impaired relaxation).  2. Right ventricular systolic function is normal. The right ventricular size is normal.  3. Left atrial size was mildly dilated.  4. Right atrial size was mildly dilated.  5. The mitral valve is degenerative. Trivial mitral valve regurgitation.  No evidence of mitral stenosis.  6. The aortic valve is tricuspid. Aortic valve regurgitation is not visualized. No aortic stenosis is present.  7. The inferior vena cava is normal in size with greater than 50% respiratory variability, suggesting right atrial pressure of 3 mmHg. Comparison(s): Left ventricular dilation has improved. FINDINGS  Left Ventricle: Left ventricular ejection fraction, by estimation, is 30 to 35%. Left ventricular ejection fraction by 3D volume is 31 %. The left ventricle has moderately decreased function. The left ventricle demonstrates regional wall motion abnormalities. The left ventricular internal cavity size was severely dilated. There is moderate concentric left ventricular hypertrophy. Left ventricular diastolic parameters are consistent with Grade I diastolic dysfunction (impaired relaxation).  LV Wall Scoring: The anterior septum and mid inferoseptal segment are hypokinetic. Right Ventricle: The right ventricular size is normal. No increase in right ventricular wall  thickness. Right ventricular systolic function is normal. Left Atrium: Left atrial size was mildly dilated. Right Atrium: Right atrial size was mildly dilated. Pericardium: There is no evidence of pericardial effusion. Presence of epicardial fat layer. Mitral Valve: The mitral valve is degenerative in appearance. Trivial mitral valve regurgitation. No evidence of mitral valve stenosis. Tricuspid Valve: The tricuspid valve is normal in structure. Tricuspid valve regurgitation is mild. Aortic Valve: The aortic valve is tricuspid. Aortic valve regurgitation is not visualized. No aortic stenosis is present. Pulmonic Valve: The pulmonic valve was normal in structure. Pulmonic valve regurgitation is not visualized. No evidence of pulmonic stenosis. Aorta: The aortic root and ascending aorta are structurally normal, with no evidence of dilitation. Venous: The inferior vena cava is normal in size with greater than 50% respiratory variability, suggesting right atrial pressure of 3 mmHg. IAS/Shunts: The interatrial septum is aneurysmal. The interatrial septum appears to be lipomatous. No atrial level shunt detected by color flow Doppler.  LEFT VENTRICLE PLAX 2D LVIDd:         6.65 cm         Diastology LVIDs:         5.70 cm         LV e' medial:    4.13 cm/s LV PW:         1.30 cm         LV E/e' medial:  15.0 LV IVS:        1.20 cm         LV e' lateral:   4.35 cm/s LVOT diam:     2.20 cm         LV E/e' lateral: 14.3 LV SV:         90 LV SV Index:   43 LVOT Area:     3.80 cm        3D Volume EF                                LV 3D EF:    Left                                             ventricul LV Volumes (MOD)                            ar LV vol d, MOD    217.0 ml  ejection A2C:                                        fraction LV vol d, MOD    196.0 ml                   by 3D A4C:                                        volume is LV vol s, MOD    138.0 ml                   31 %. A2C: LV vol s, MOD     124.0 ml A4C:                           3D Volume EF: LV SV MOD A2C:   79.0 ml       3D EF:        31 % LV SV MOD A4C:   196.0 ml      LV EDV:       245 ml LV SV MOD BP:    71.4 ml       LV ESV:       169 ml                                LV SV:        76 ml RIGHT VENTRICLE             IVC RV S prime:     12.80 cm/s  IVC diam: 2.00 cm TAPSE (M-mode): 2.0 cm LEFT ATRIUM             Index        RIGHT ATRIUM           Index LA diam:        3.50 cm 1.67 cm/m   RA Area:     13.50 cm LA Vol (A2C):   60.3 ml 28.78 ml/m  RA Volume:   31.10 ml  14.84 ml/m LA Vol (A4C):   34.4 ml 16.42 ml/m LA Biplane Vol: 46.4 ml 22.15 ml/m  AORTIC VALVE LVOT Vmax:   118.00 cm/s LVOT Vmean:  76.400 cm/s LVOT VTI:    0.238 m  AORTA Ao Root diam: 3.45 cm Ao Asc diam:  3.00 cm MITRAL VALVE                TRICUSPID VALVE MV Area (PHT): 3.42 cm     TR Peak grad:   22.1 mmHg MV Decel Time: 222 msec     TR Vmax:        235.00 cm/s MV E velocity: 62.10 cm/s MV A velocity: 115.00 cm/s  SHUNTS MV E/A ratio:  0.54         Systemic VTI:  0.24 m                             Systemic Diam: 2.20 cm Riley Lam MD Electronically signed by Riley Lam MD Signature Date/Time: 07/27/2022/3:04:56 PM    Final    [place] LIMITED ULTRASOUND INCLUDING AXILLA  RIGHT BREAST  Result Date: 06/27/2022 CLINICAL DATA:  72 year old female presenting as a recall from screening for possible right breast mass. EXAM: ULTRASOUND OF THE RIGHT BREAST COMPARISON:  Previous exam(s). FINDINGS: Targeted ultrasound is performed in the right breast at 11 o'clock 1 cm from the nipple demonstrating an oval circumscribed mass with internal echoes measuring 0.4 x 0.4 x 0.4 cm, likely a complicated cyst. No internal vascularity. This corresponds to the mammographic finding. Targeted ultrasound of the right axilla demonstrates normal lymph nodes. IMPRESSION: Probably benign mass in the right breast at 11 o'clock, likely a complicated cyst. RECOMMENDATION: Right breast  ultrasound in 6 months. I have discussed the findings and recommendations with the patient who agrees to short-term follow-up. If applicable, a reminder letter will be sent to the patient regarding the next appointment. BI-RADS CATEGORY  3: Probably benign. Electronically Signed   By: Emmaline Kluver M.D.   On: 06/27/2022 13:47   Recent Results (from the past 2160 hour(s))  Digoxin level     Status: Abnormal   Collection Time: 07/13/22 11:05 AM  Result Value Ref Range   Digoxin Level 0.4 (L) 0.8 - 2.0 ng/mL    Comment: Performed at [facility], [address], [place], [place] [address]  Basic Metabolic Panel (BMET)     Status: Abnormal   Collection Time: 07/13/22 11:05 AM  Result Value Ref Range   Sodium 139 135 - 145 mmol/L   Potassium 4.1 3.5 - 5.1 mmol/L   Chloride 101 98 - 111 mmol/L   CO2 27 22 - 32 mmol/L   Glucose, Bld 119 (H) 70 - 99 mg/dL    Comment: Glucose reference range applies only to samples taken after fasting for at least 8 hours.   BUN 21 8 - 23 mg/dL   Creatinine, Ser 4.78 0.44 - 1.00 mg/dL   Calcium 29.5 8.9 - 62.1 mg/dL   GFR, Estimated >30 >86 mL/min    Comment: (NOTE) Calculated using the CKD-EPI Creatinine Equation (2021)    Anion gap 11 5 - 15    Comment: Performed at [facility], [address], [place], [place] [address]  B Nat Peptide     Status: None   Collection Time: 07/13/22 11:05 AM  Result Value Ref Range   B Natriuretic Peptide 60.6 0.0 - 100.0 pg/mL    Comment: Performed at [facility], [address] [place], [place] [address]  ECHOCARDIOGRAM COMPLETE     Status: None   Collection Time: 07/27/22 11:46 AM  Result Value Ref Range   S' Lateral 5.70 cm   Single Plane A4C EF 36.7 %   Single Plane A2C EF 36.4 %   Calc EF 34.2 %   Area-P 1/2 3.42 cm2   Est EF 30 - 35%   Glucose, capillary     Status: Abnormal   Collection Time: 08/28/22  2:02 PM  Result Value Ref Range   Glucose-Capillary 125 (H) 70 - 99 mg/dL     Comment: Glucose reference range applies only to samples taken after fasting for at least 8 hours.  Glucose, capillary     Status: Abnormal   Collection Time: 09/05/22  3:05 PM  Result Value Ref Range   Glucose-Capillary 144 (H) 70 - 99 mg/dL    Comment: Glucose reference range applies only to samples taken after fasting for at least 8 hours.  Glucose, capillary     Status: Abnormal   Collection Time: 09/05/22  3:59  PM  Result Value Ref Range   Glucose-Capillary 109 (H) 70 - 99 mg/dL    Comment: Glucose reference range applies only to samples taken after fasting for at least 8 hours.  POCT glycosylated hemoglobin (Hb A1C)     Status: Abnormal   Collection Time: 09/07/22  1:53 PM  Result Value Ref Range   Hemoglobin A1C 7.4 (A) 4.0 - 5.6 %   HbA1c POC (<> result, manual entry)     HbA1c, POC (prediabetic range)     HbA1c, POC (controlled diabetic range)          Garner Nash, MD, MS

## 2022-09-10 ENCOUNTER — Encounter (HOSPITAL_COMMUNITY): Payer: 59

## 2022-09-10 ENCOUNTER — Ambulatory Visit (HOSPITAL_COMMUNITY): Payer: 59

## 2022-09-10 DIAGNOSIS — I5042 Chronic combined systolic (congestive) and diastolic (congestive) heart failure: Secondary | ICD-10-CM | POA: Insufficient documentation

## 2022-09-10 NOTE — Assessment & Plan Note (Signed)
Long-standing depression managed with venlafaxine and Wellbutrin. Plan: Continue current antidepressants. Discuss with patient about potential referral to mental health services for ongoing support.

## 2022-09-10 NOTE — Assessment & Plan Note (Signed)
Diabetic management appears stable with A1C at 7.4. Plan: Continue current diabetic medications, monitor A1C every 3 months, encourage weight management.

## 2022-09-10 NOTE — Assessment & Plan Note (Addendum)
Candidate for knee replacement; currently using hydrocodone due to pain aversion. Plan: Discuss with patient potential referral to pain management or orthopedic surgery due to the fact that I do not provide chronic pain management care.Discussed issues of long term opioid use. Patient declined referral at this moment.

## 2022-09-10 NOTE — Assessment & Plan Note (Signed)
Advanced heart failure, diuretic therapy, and other heart failure medications managed by cardiologist. Plan: Continue current medications with cardiologist oversight. Follow up while patient is in cardiac rehab program.

## 2022-09-12 ENCOUNTER — Ambulatory Visit (HOSPITAL_COMMUNITY): Payer: 59

## 2022-09-12 ENCOUNTER — Encounter (HOSPITAL_COMMUNITY): Admission: RE | Admit: 2022-09-12 | Payer: 59 | Source: Ambulatory Visit

## 2022-09-14 ENCOUNTER — Encounter (HOSPITAL_COMMUNITY)
Admission: RE | Admit: 2022-09-14 | Discharge: 2022-09-14 | Disposition: A | Discharge status: Home / Self Care | Payer: 59 | Source: Ambulatory Visit | Attending: Cardiology | Admitting: Cardiology

## 2022-09-14 ENCOUNTER — Ambulatory Visit (HOSPITAL_COMMUNITY): Payer: 59

## 2022-09-14 DIAGNOSIS — I5022 Chronic systolic (congestive) heart failure: Secondary | ICD-10-CM | POA: Diagnosis not present

## 2022-09-17 ENCOUNTER — Encounter (HOSPITAL_COMMUNITY)
Admission: RE | Admit: 2022-09-17 | Discharge: 2022-09-17 | Disposition: A | Discharge status: Home / Self Care | Payer: 59 | Source: Ambulatory Visit | Attending: Cardiology | Admitting: Cardiology

## 2022-09-17 ENCOUNTER — Ambulatory Visit (HOSPITAL_COMMUNITY): Payer: 59

## 2022-09-17 DIAGNOSIS — I5022 Chronic systolic (congestive) heart failure: Secondary | ICD-10-CM | POA: Diagnosis not present

## 2022-09-17 LAB — GLUCOSE, CAPILLARY
Glucose-Capillary: 119 mg/dL — ABNORMAL HIGH (ref 70–99)
Glucose-Capillary: 115 mg/dL — ABNORMAL HIGH (ref 70–99)

## 2022-09-19 ENCOUNTER — Encounter (HOSPITAL_COMMUNITY)
Admission: RE | Admit: 2022-09-19 | Discharge: 2022-09-19 | Disposition: A | Discharge status: Home / Self Care | Payer: 59 | Source: Ambulatory Visit | Attending: Cardiology | Admitting: Cardiology

## 2022-09-19 ENCOUNTER — Ambulatory Visit (HOSPITAL_COMMUNITY): Payer: 59

## 2022-09-19 DIAGNOSIS — I5022 Chronic systolic (congestive) heart failure: Secondary | ICD-10-CM

## 2022-09-19 LAB — GLUCOSE, CAPILLARY: Glucose-Capillary: 142 mg/dL — ABNORMAL HIGH (ref 70–99)

## 2022-09-21 ENCOUNTER — Ambulatory Visit (HOSPITAL_COMMUNITY): Payer: 59

## 2022-09-21 ENCOUNTER — Encounter (HOSPITAL_COMMUNITY): Payer: 59

## 2022-09-24 ENCOUNTER — Ambulatory Visit (HOSPITAL_COMMUNITY): Payer: 59

## 2022-09-24 ENCOUNTER — Other Ambulatory Visit: Payer: Self-pay | Admitting: Family Medicine

## 2022-09-24 ENCOUNTER — Encounter (HOSPITAL_COMMUNITY)
Admission: RE | Admit: 2022-09-24 | Discharge: 2022-09-24 | Disposition: A | Discharge status: Home / Self Care | Payer: 59 | Source: Ambulatory Visit | Attending: Cardiology | Admitting: Cardiology

## 2022-09-24 DIAGNOSIS — I5022 Chronic systolic (congestive) heart failure: Secondary | ICD-10-CM | POA: Diagnosis not present

## 2022-09-24 LAB — GLUCOSE, CAPILLARY: Glucose-Capillary: 151 mg/dL — ABNORMAL HIGH (ref 70–99)

## 2022-09-25 ENCOUNTER — Other Ambulatory Visit (HOSPITAL_COMMUNITY): Payer: Self-pay

## 2022-09-25 ENCOUNTER — Telehealth (HOSPITAL_COMMUNITY): Payer: Self-pay

## 2022-09-25 MED ORDER — METOPROLOL SUCCINATE ER 50 MG PO TB24
50.0000 mg | ORAL_TABLET | Freq: Every day | ORAL | 3 refills | Status: DC
  Filled 2022-09-25: qty 90, 90d supply, fill #0

## 2022-09-25 NOTE — Telephone Encounter (Signed)
Received fax from pharmacy stating patient is taking Metoprolol succinate 100, and metoprolol succinate 50mg - pharmacy needs to confirm this is correct dosage.   Per last OV note with Dr. Gasper Lloyd patient taking Metoprolol succinate 100mg - will confirm with provider.

## 2022-09-26 ENCOUNTER — Ambulatory Visit (HOSPITAL_COMMUNITY): Payer: 59

## 2022-09-26 ENCOUNTER — Other Ambulatory Visit (HOSPITAL_COMMUNITY): Payer: Self-pay

## 2022-09-26 ENCOUNTER — Encounter (HOSPITAL_COMMUNITY)
Admission: RE | Admit: 2022-09-26 | Discharge: 2022-09-26 | Disposition: A | Discharge status: Home / Self Care | Payer: 59 | Source: Ambulatory Visit | Attending: Cardiology | Admitting: Cardiology

## 2022-09-26 ENCOUNTER — Other Ambulatory Visit: Payer: Self-pay

## 2022-09-26 DIAGNOSIS — I5022 Chronic systolic (congestive) heart failure: Secondary | ICD-10-CM | POA: Diagnosis not present

## 2022-09-26 LAB — GLUCOSE, CAPILLARY: Glucose-Capillary: 148 mg/dL — ABNORMAL HIGH (ref 70–99)

## 2022-09-27 NOTE — Telephone Encounter (Signed)
Spoke with patient, advised her that per provider she should be taking metoprolol succinate 100mg . Patient is adamant that she is supposed to be taking metoprolol succinate 150mg . Patient states she was told this by provider. Advised patient that also per last OV note that Dr. Gasper Lloyd states the following: Beta-Blocker:Toprol 100mg  daily;   Patient would like to clarify this with Dr. Gasper Lloyd. Advised patient I would forward message to him to make him aware.

## 2022-09-28 ENCOUNTER — Encounter (HOSPITAL_COMMUNITY): Payer: 59

## 2022-09-28 ENCOUNTER — Ambulatory Visit (HOSPITAL_COMMUNITY): Payer: 59

## 2022-09-28 ENCOUNTER — Other Ambulatory Visit (HOSPITAL_COMMUNITY): Payer: Self-pay

## 2022-09-28 MED ORDER — METOPROLOL SUCCINATE ER 50 MG PO TB24
150.0000 mg | ORAL_TABLET | Freq: Every day | ORAL | 3 refills | Status: DC
  Filled 2022-09-28: qty 90, 30d supply, fill #0

## 2022-09-28 NOTE — Addendum Note (Signed)
Addended by: Bea Laura B on: 09/28/2022 04:00 PM   Modules accepted: Orders

## 2022-09-28 NOTE — Telephone Encounter (Addendum)
Stacey Nettles, DO  You23 hours ago (4:37 PM)   AS She can go ahead and increase to 150.  Stacey Fletcher   Patient aware and new prescription for Metoprolol succinate 150mg  sent to patient preferred pharmacy. Patient aware of all instructions.  Advised patient to call back to office with any issues, questions, or concerns. Patient verbalized understanding.

## 2022-10-01 ENCOUNTER — Ambulatory Visit (HOSPITAL_COMMUNITY): Payer: 59

## 2022-10-01 ENCOUNTER — Encounter (HOSPITAL_COMMUNITY)
Admission: RE | Admit: 2022-10-01 | Discharge: 2022-10-01 | Disposition: A | Discharge status: Home / Self Care | Payer: 59 | Source: Ambulatory Visit | Attending: Cardiology | Admitting: Cardiology

## 2022-10-01 DIAGNOSIS — I5022 Chronic systolic (congestive) heart failure: Secondary | ICD-10-CM | POA: Diagnosis not present

## 2022-10-02 NOTE — Progress Notes (Signed)
Cardiac Individual Treatment Plan  Patient Details  Name: Stacey Fletcher MRN: [id number] Date of Birth: 1950/07/17 Referring Provider:   Flowsheet Row INTENSIVE CARDIAC REHAB ORIENT from 08/28/2022 in [facility]  Referring Provider Dorthula Nettles, DO       Initial Encounter Date:  Flowsheet Row INTENSIVE CARDIAC REHAB ORIENT from 08/28/2022 in [facility] for Heart, Vascular, & Lung [facility]  Date 08/28/22       Visit Diagnosis: Heart failure, chronic systolic (HCC)  Patient's Home Medications on Admission:  Current Outpatient Medications:    aspirin 81 MG chewable tablet, Chew 81 mg by mouth in the morning., Disp: , Rfl:    buPROPion (WELLBUTRIN XL) 300 MG 24 hr tablet, Take 300 mg by mouth every morning., Disp: , Rfl:    cetirizine (ZYRTEC) 10 MG tablet, Take 10 mg by mouth daily as needed for allergies., Disp: , Rfl:    dapagliflozin propanediol (FARXIGA) 10 MG TABS tablet, Take 1 tablet (10 mg total) by mouth daily., Disp: 90 tablet, Rfl: 3   digoxin (LANOXIN) 0.125 MG tablet, Take 1 tablet (0.125 mg total) by mouth daily., Disp: 90 tablet, Rfl: 3   diphenhydrAMINE (BENADRYL) 25 MG tablet, Take 25 mg by mouth at bedtime., Disp: , Rfl:    fluticasone (FLONASE) 50 MCG/ACT nasal spray, Place 1 spray into both nostrils daily as needed for allergies or rhinitis., Disp: , Rfl:    furosemide (LASIX) 40 MG tablet, Take 1 tablet (40 mg total) by mouth every other day., Disp: 45 tablet, Rfl: 3   gabapentin (NEURONTIN) 100 MG capsule, TAKE 1 CAPSULE(100 MG) BY MOUTH AT BEDTIME, Disp: 30 capsule, Rfl: 6   levothyroxine (SYNTHROID) 25 MCG tablet, Take 25 mcg by mouth daily before breakfast., Disp: , Rfl:    losartan (COZAAR) 100 MG tablet, Take 1 tablet (100 mg total) by mouth daily., Disp: 90 tablet, Rfl: 3   losartan (COZAAR) 50 MG tablet, Take 1 tablet (50 mg total) by mouth daily., Disp: 90 tablet, Rfl:  3   Menthol-Methyl Salicylate (MUSCLE RUB) 10-15 % CREA, Apply 1 Application topically 2 (two) times a week., Disp: , Rfl:    metFORMIN (GLUCOPHAGE) 1000 MG tablet, Take 1,000 mg by mouth daily., Disp: , Rfl:    metoprolol succinate (TOPROL-XL) 50 MG 24 hr tablet, Take 3 tablets (150 mg total) by mouth daily. Take with or immediately following a meal., Disp: 90 tablet, Rfl: 3   Potassium Chloride ER 20 MEQ TBCR, Take 1 tablet (20 mEq total) by mouth every other day., Disp: 45 tablet, Rfl: 3   pravastatin (PRAVACHOL) 20 MG tablet, Take 20 mg by mouth in the morning., Disp: , Rfl:    spironolactone (ALDACTONE) 25 MG tablet, Take 1 tablet (25 mg total) by mouth daily., Disp: 90 tablet, Rfl: 3   venlafaxine XR (EFFEXOR-XR) 75 MG 24 hr capsule, Take 75 mg by mouth in the morning, at noon, and at bedtime., Disp: , Rfl:   Past Medical History: Past Medical History:  Diagnosis Date   Coronary artery disease    Depression    Diabetes mellitus    Fibromyalgia    Goiter    Hypertension    MI (myocardial infarction) (HCC)    Osteoarthritis    Pneumonia    10+ years ago.    Tobacco Use: Social History   Tobacco Use  Smoking Status Former   Current packs/day: 0.00   Types: Cigarettes  Quit date: 07/07/2018   Years since quitting: 4.2   Passive exposure: Never  Smokeless Tobacco Never    Labs: Review Flowsheet  More data exists      Latest Ref Rng & Units 07/10/2009 05/02/2020 07/20/2021 07/21/2021 09/07/2022  Labs for ITP Cardiac and Pulmonary Rehab  Hemoglobin A1c 4.0 - 5.6 % - 6.8  6.7  - 7.4   PH, Arterial 7.35 - 7.45 - - - 7.427  -  PCO2 arterial 32 - 48 mmHg - - - 41.6  -  Bicarbonate 20.0 - 28.0 mmol/L - - - 27.4  29.3  28.5  -  TCO2 22 - 32 mmol/L 31  - - 29  31  30   -  O2 Saturation % - - - 96  56  58  -    Details       Multiple values from one day are sorted in reverse-chronological order         Capillary Blood Glucose: Lab Results  Component Value Date   GLUCAP 148  (H) 09/26/2022   GLUCAP 151 (H) 09/24/2022   GLUCAP 142 (H) 09/19/2022   GLUCAP 115 (H) 09/17/2022   GLUCAP 119 (H) 09/17/2022     Exercise Target Goals: Exercise Program Goal: Individual exercise prescription set using results from initial 6 min walk test and THRR while considering  patient's activity barriers and safety.   Exercise Prescription Goal: Initial exercise prescription builds to 30-45 minutes a day of aerobic activity, 2-3 days per week.  Home exercise guidelines will be given to patient during program as part of exercise prescription that the participant will acknowledge.  Activity Barriers & Risk Stratification:  Activity Barriers & Cardiac Risk Stratification - 08/28/22 1442       Activity Barriers & Cardiac Risk Stratification   Activity Barriers Arthritis;Other (comment);Fibromyalgia    Comments Chronic knee pain, per patient needs bilateral knee replacement. Severe OA. Has been told she has fibromyalgia.    Cardiac Risk Stratification High             6 Minute Walk:  6 Minute Walk     Row Name 08/28/22 1525         6 Minute Walk   Phase Initial     Distance 1205 feet     Walk Time 6 minutes     # of Rest Breaks 1  12-second standing rest break because nose was running     MPH 2.28     METS 2.89     RPE 7     Perceived Dyspnea  1     VO2 Peak 10.11     Symptoms Yes (comment)     Comments Mild SOB, runny nose     Resting HR 83 bpm     Resting BP 122/74     Resting Oxygen Saturation  97 %     Exercise Oxygen Saturation  during 6 min walk 98 %     Max Ex. HR 137 bpm     Max Ex. BP 146/72     2 Minute Post BP 138/74              Oxygen Initial Assessment:   Oxygen Re-Evaluation:   Oxygen Discharge (Final Oxygen Re-Evaluation):   Initial Exercise Prescription:  Initial Exercise Prescription - 08/28/22 1600       Date of Initial Exercise RX and Referring Provider   Date 08/28/22    Referring Provider Dorthula Nettles, DO  Expected Discharge Date 11/21/22      Recumbant Bike   Level 1    Watts 25    Minutes 15    METs 2.2      NuStep   Level 1    SPM 75    Minutes 15    METs 2.2      Prescription Details   Frequency (times per week) 3    Duration Progress to 30 minutes of continuous aerobic without signs/symptoms of physical distress      Intensity   THRR 40-80% of Max Heartrate 59-118    Ratings of Perceived Exertion 11-13    Perceived Dyspnea 0-4      Progression   Progression Continue to progress workloads to maintain intensity without signs/symptoms of physical distress.      Resistance Training   Training Prescription Yes    Weight 2 lbs    Reps 10-15             Perform Capillary Blood Glucose checks as needed.  Exercise Prescription Changes:   Exercise Prescription Changes     Row Name 09/05/22 1633             Response to Exercise   Blood Pressure (Admit) 124/62       Blood Pressure (Exercise) 150/70       Blood Pressure (Exit) 104/68       Heart Rate (Admit) 85 bpm       Heart Rate (Exercise) 97 bpm       Heart Rate (Exit) 88 bpm       Rating of Perceived Exertion (Exercise) 10       Perceived Dyspnea (Exercise) 0       Symptoms none       Comments Pt first day in the [facility] ICR program       Duration Progress to 30 minutes of  aerobic without signs/symptoms of physical distress       Intensity THRR unchanged         Progression   Progression Continue to progress workloads to maintain intensity without signs/symptoms of physical distress.       Average METs 1.4         Resistance Training   Training Prescription No         NuStep   Level 1       SPM 60       Minutes 15       METs 1.8         Recumbant Elliptical   Level 1       RPM 21       Watts 22       Minutes 15       METs 1                Exercise Comments:   Exercise Comments     Row Name 09/05/22 1641 09/26/22 1626         Exercise Comments Pt first day in the [facility]  ICR program. Pt tolerated exercise well with an average MET level of 1.4. Pt attempted recumbent bike, but was uncomfortable for her knees, Octane worked out much better. She felt good with her new ExRx and she is off to a good start. Will continue to increase workloads as tolerated without sign or symtpom Reviewed MET's and goals.               Exercise Goals and Review:   Exercise Goals  Row Name 08/28/22 1442             Exercise Goals   Increase Physical Activity Yes       Intervention Provide advice, education, support and counseling about physical activity/exercise needs.;Develop an individualized exercise prescription for aerobic and resistive training based on initial evaluation findings, risk stratification, comorbidities and participant's personal goals.       Expected Outcomes Short Term: Attend rehab on a regular basis to increase amount of physical activity.;Long Term: Add in home exercise to make exercise part of routine and to increase amount of physical activity.;Long Term: Exercising regularly at least 3-5 days a week.       Increase Strength and Stamina Yes       Intervention Provide advice, education, support and counseling about physical activity/exercise needs.;Develop an individualized exercise prescription for aerobic and resistive training based on initial evaluation findings, risk stratification, comorbidities and participant's personal goals.       Expected Outcomes Long Term: Improve cardiorespiratory fitness, muscular endurance and strength as measured by increased METs and functional capacity ( );Short Term: Perform resistance training exercises routinely during rehab and add in resistance training at home;Short Term: Increase workloads from initial exercise prescription for resistance, speed, and METs.       Able to understand and use rate of perceived exertion (RPE) scale Yes       Intervention Provide education and explanation on how to use RPE scale        Expected Outcomes Short Term: Able to use RPE daily in rehab to express subjective intensity level;Long Term:  Able to use RPE to guide intensity level when exercising independently       Knowledge and understanding of Target Heart Rate Range (THRR) Yes       Intervention Provide education and explanation of THRR including how the numbers were predicted and where they are located for reference       Expected Outcomes Short Term: Able to state/look up THRR;Long Term: Able to use THRR to govern intensity when exercising independently;Short Term: Able to use daily as guideline for intensity in rehab       Able to check pulse independently Yes       Intervention Provide education and demonstration on how to check pulse in carotid and radial arteries.;Review the importance of being able to check your own pulse for safety during independent exercise       Expected Outcomes Short Term: Able to explain why pulse checking is important during independent exercise;Long Term: Able to check pulse independently and accurately       Understanding of Exercise Prescription Yes       Intervention Provide education, explanation, and written materials on patient's individual exercise prescription       Expected Outcomes Short Term: Able to explain program exercise prescription;Long Term: Able to explain home exercise prescription to exercise independently                Exercise Goals Re-Evaluation :  Exercise Goals Re-Evaluation     Row Name 09/05/22 1638 09/26/22 1621           Exercise Goal Re-Evaluation   Exercise Goals Review Increase Physical Activity;Understanding of Exercise Prescription;Increase Strength and Stamina;Knowledge and understanding of Target Heart Rate Range (THRR);Able to understand and use rate of perceived exertion (RPE) scale Increase Physical Activity;Understanding of Exercise Prescription;Increase Strength and Stamina;Knowledge and understanding of Target Heart Rate Range  (THRR);Able to understand and use rate of perceived exertion (RPE)  scale      Comments Pt first day in the [facility] ICR program. Pt tolerated exercise well with an average MET level of 1.4. Pt attempted recumbent bike, but was uncomfortable for her knees, Octane worked out much better. She felt good with her new ExRx and she is off to a good start Reviewed MET's and goals. Pt tolerated exercise well with an average MET level of 2.1. Talked with patient about home ExRx, but she states this is a lot for exercise right now. Will re-evaluated home ExRx soon. Pt states she feels good about her goals of getting moving and back into exercise. She is also increasing her strength and is able to do more, especially in the yard      Expected Outcomes Will continue to increase workloads as tolerated without sign or symtpom Will continue to increase workloads as tolerated without sign or symtpom               Discharge Exercise Prescription (Final Exercise Prescription Changes):  Exercise Prescription Changes - 09/05/22 1633       Response to Exercise   Blood Pressure (Admit) 124/62    Blood Pressure (Exercise) 150/70    Blood Pressure (Exit) 104/68    Heart Rate (Admit) 85 bpm    Heart Rate (Exercise) 97 bpm    Heart Rate (Exit) 88 bpm    Rating of Perceived Exertion (Exercise) 10    Perceived Dyspnea (Exercise) 0    Symptoms none    Comments Pt first day in the [facility] ICR program    Duration Progress to 30 minutes of  aerobic without signs/symptoms of physical distress    Intensity THRR unchanged      Progression   Progression Continue to progress workloads to maintain intensity without signs/symptoms of physical distress.    Average METs 1.4      Resistance Training   Training Prescription No      NuStep   Level 1    SPM 60    Minutes 15    METs 1.8      Recumbant Elliptical   Level 1    RPM 21    Watts 22    Minutes 15    METs 1             Nutrition:  Target Goals:  Understanding of nutrition guidelines, daily intake of sodium 1500mg , cholesterol 200mg , calories 30% from fat and 7% or less from saturated fats, daily to have 5 or more servings of fruits and vegetables.  Biometrics:  Pre Biometrics - 08/28/22 1311       Pre Biometrics   Waist Circumference 41 inches    Hip Circumference 45.75 inches    Waist to Hip Ratio 0.9 %    Triceps Skinfold 23 mm    % Body Fat 41.5 %    Grip Strength 28 kg    Flexibility 11.5 in    Single Leg Stand 9.18 seconds              Nutrition Therapy Plan and Nutrition Goals:  Nutrition Therapy & Goals - 09/06/22 0940       Nutrition Therapy   Diet Heart Healthy Diet    Drug/Food Interactions Statins/Certain Fruits      Personal Nutrition Goals   Nutrition Goal Patient to identify strategies for reducing cardiovascular risk by attending the [facility] education and nutrition series weekly.    Personal Goal #2 Patient to improve diet quality by using the plate  method as a guide for meal planning to include lean protein/plant protein, fruits, vegetables, whole grains, nonfat dairy as part of a well-balanced diet.    Personal Goal #3 Patient to reduce sodium intake to 1500mg  per day.    Comments Benjie reports motivation to learn more about nutrition for heart health/CHF. Patient will benefit from participation in intensive cardiac rehab for nutrition, exercise, and lifestyle modification.      Intervention Plan   Intervention Prescribe, educate and counsel regarding individualized specific dietary modifications aiming towards targeted core components such as weight, hypertension, lipid management, diabetes, heart failure and other comorbidities.;Nutrition handout(s) given to patient.    Expected Outcomes Short Term Goal: Understand basic principles of dietary content, such as calories, fat, sodium, cholesterol and nutrients.;Long Term Goal: Adherence to prescribed nutrition plan.             Nutrition  Assessments:  Nutrition Assessments - 09/20/22 0827       Rate Your Plate Scores   Pre Score 52            MEDIFICTS Score Key: ?70 Need to make dietary changes  40-70 Heart Healthy Diet ? 40 Therapeutic Level Cholesterol Diet   Flowsheet Row INTENSIVE CARDIAC REHAB from 09/19/2022 in [facility] for Heart, Vascular, & Lung Health  Picture Your Plate Total Score on Admission 52      Picture Your Plate Scores: <16 Unhealthy dietary pattern with much room for improvement. 41-50 Dietary pattern unlikely to meet recommendations for good health and room for improvement. 51-60 More healthful dietary pattern, with some room for improvement.  >60 Healthy dietary pattern, although there may be some specific behaviors that could be improved.    Nutrition Goals Re-Evaluation:  Nutrition Goals Re-Evaluation     Row Name 09/06/22 0940             Goals   Current Weight 210 lb 8.6 oz (95.5 kg)       Comment lipoproteinA WNL, A1c 6.7       Expected Outcome Lavra reports motivation to learn more about nutrition for heart health/CHF. Patient will benefit from participation in intensive cardiac rehab for nutrition, exercise, and lifestyle modification.                Nutrition Goals Re-Evaluation:  Nutrition Goals Re-Evaluation     Row Name 09/06/22 0940             Goals   Current Weight 210 lb 8.6 oz (95.5 kg)       Comment lipoproteinA WNL, A1c 6.7       Expected Outcome Sentoria reports motivation to learn more about nutrition for heart health/CHF. Patient will benefit from participation in intensive cardiac rehab for nutrition, exercise, and lifestyle modification.                Nutrition Goals Discharge (Final Nutrition Goals Re-Evaluation):  Nutrition Goals Re-Evaluation - 09/06/22 0940       Goals   Current Weight 210 lb 8.6 oz (95.5 kg)    Comment lipoproteinA WNL, A1c 6.7    Expected Outcome Makalla reports motivation to  learn more about nutrition for heart health/CHF. Patient will benefit from participation in intensive cardiac rehab for nutrition, exercise, and lifestyle modification.             Psychosocial: Target Goals: Acknowledge presence or absence of significant depression and/or stress, maximize coping skills, provide positive support system. Participant is able to verbalize types  and ability to use techniques and skills needed for reducing stress and depression.  Initial Review & Psychosocial Screening:  Initial Psych Review & Screening - 08/28/22 1421       Initial Review   Current issues with Current Psychotropic Meds;Current Stress Concerns;Current Depression;History of Depression    Source of Stress Concerns Chronic Illness;Occupation;Unable to participate in former interests or hobbies;Unable to perform yard/household activities    Comments Will has PTSD from the murder of her son when he was 75 years old. [place] is taking an antidepressant for PTSD. Ileen says she is not interested  in counseling at this time.      Family Dynamics   Good Support System? Yes   Makensi lives with her daughter and grandchldren. Elfreda also has a son who she has for support     Barriers   Psychosocial barriers to participate in program The patient should benefit from training in stress management and relaxation.      Screening Interventions   Interventions Encouraged to exercise    Expected Outcomes Long Term Goal: Stressors or current issues are controlled or eliminated.;Short Term goal: Identification and review with participant of any Quality of Life or Depression concerns found by scoring the questionnaire.;Long Term goal: The participant improves quality of Life and PHQ9 Scores as seen by post scores and/or verbalization of changes             Quality of Life Scores:  Quality of Life - 08/28/22 1450       Quality of Life   Select Quality of Life      Quality of Life Scores    Health/Function Pre 25.29 %    Socioeconomic Pre 22.5 %    Psych/Spiritual Pre 24.86 %    Family Pre 28.5 %    GLOBAL Pre 25.06 %            Scores of 19 and below usually indicate a poorer quality of life in these areas.  A difference of  2-3 points is a clinically meaningful difference.  A difference of 2-3 points in the total score of the Quality of Life Index has been associated with significant improvement in overall quality of life, self-image, physical symptoms, and general health in studies assessing change in quality of life.  PHQ-9: Review Flowsheet       09/07/2022 08/29/2022  Depression screen PHQ 2/9  Decreased Interest 1 1  Down, Depressed, Hopeless 3 1  PHQ - 2 Score 4 2  Altered sleeping 3 2  Tired, decreased energy 1 1  Change in appetite 3 1  Feeling bad or failure about yourself  0 0  Trouble concentrating 0 0  Moving slowly or fidgety/restless 0 0  Suicidal thoughts 0 0  PHQ-9 Score 11 6  Difficult doing work/chores Somewhat difficult Not difficult at all    Details           Interpretation of Total Score  Total Score Depression Severity:  1-4 = Minimal depression, 5-9 = Mild depression, 10-14 = Moderate depression, 15-19 = Moderately severe depression, 20-27 = Severe depression   Psychosocial Evaluation and Intervention:   Psychosocial Re-Evaluation:  Psychosocial Re-Evaluation     Row Name 10/02/22 1400             Psychosocial Re-Evaluation   Current issues with Current Stress Concerns;Current Psychotropic Meds;Current Depression;History of Depression       Comments Sumayo deines having any  increased depression or stressors during exercise at cardiac  rehab. Ellyson continues to deny the need for counselling at this time.       Expected Outcomes Marageret will have controlled or decreased depression/ stressors upon completion of cardiac rehab       Interventions Encouraged to attend Cardiac Rehabilitation for the exercise;Stress  management education;Relaxation education       Continue Psychosocial Services  Follow up required by staff         Initial Review   Source of Stress Concerns Chronic Illness;Family;Unable to participate in former interests or hobbies;Unable to perform yard/household activities;Financial       Comments Will continue to monitor and offer support as needed                Psychosocial Discharge (Final Psychosocial Re-Evaluation):  Psychosocial Re-Evaluation - 10/02/22 1400       Psychosocial Re-Evaluation   Current issues with Current Stress Concerns;Current Psychotropic Meds;Current Depression;History of Depression    Comments Kecia deines having any  increased depression or stressors during exercise at cardiac rehab. Annis continues to deny the need for counselling at this time.    Expected Outcomes Marageret will have controlled or decreased depression/ stressors upon completion of cardiac rehab    Interventions Encouraged to attend Cardiac Rehabilitation for the exercise;Stress management education;Relaxation education    Continue Psychosocial Services  Follow up required by staff      Initial Review   Source of Stress Concerns Chronic Illness;Family;Unable to participate in former interests or hobbies;Unable to perform yard/household activities;Financial    Comments Will continue to monitor and offer support as needed             Vocational Rehabilitation: Provide vocational rehab assistance to qualifying candidates.   Vocational Rehab Evaluation & Intervention:  Vocational Rehab - 08/28/22 1451       Initial Vocational Rehab Evaluation & Intervention   Assessment shows need for Vocational Rehabilitation No   Patient  says she may be interested in vocational rehab. Given packet to review.            Education: Education Goals: Education classes will be provided on a weekly basis, covering required topics. Participant will state understanding/return  demonstration of topics presented.    Education     Row Name 09/05/22 1600     Education   Cardiac Education Topics [facility]   Customer service manager   Weekly Topic [employer]isine- Spotlight on the [employer] Zones   Instruction Review Code 1- Verbalizes Understanding   Class Start Time 1400   Class Stop Time 1448   Class Time Calculation (min) 48 min    Row Name 09/14/22 1500     Education   Cardiac Education Topics [facility]   Nurse, children's Exercise Physiologist   Select Psychosocial   Psychosocial How Our Thoughts Can Heal Our Hearts   Instruction Review Code 1- Verbalizes Understanding   Class Start Time 1400   Class Stop Time 1450   Class Time Calculation (min) 50 min    Row Name 09/17/22 1500     Education   Cardiac Education Topics [facility]   Select Workshops     Workshops   Educator Exercise Physiologist   Select Exercise   Exercise Workshop Managing Heart Disease: Your Path to a Healthier Heart   Instruction Review Code 1- Verbalizes Understanding   Class Start Time 1415   Class Stop Time 1510  Class Time Calculation (min) 55 min    Row Name 09/19/22 1500     Education   Cardiac Education Topics [facility]   Customer service manager   Weekly Topic Powerhouse Plant-Based Proteins   Instruction Review Code 1- Verbalizes Understanding   Class Start Time 1400   Class Stop Time 1440   Class Time Calculation (min) 40 min    Row Name 09/24/22 1500     Education   Cardiac Education Topics --   Select --     Workshops   Educator --   Physiological scientist --   Psychosocial Workshop --   Instruction Review Code --   Class Start Time --   Class Stop Time --   Class Time Calculation (min) --    Row Name 09/26/22 1500     Education   Cardiac Education Topics Pritikin   Customer service manager   Weekly Topic  Adding Flavor - Sodium-Free   Instruction Review Code 1- Verbalizes Understanding   Class Start Time 1400   Class Stop Time 1437   Class Time Calculation (min) 37 min    Row Name 10/01/22 1500     Education   Cardiac Education Topics Pritikin   Licensed conveyancer Nutrition   Nutrition Overview of the Pritikin Eating Plan   Instruction Review Code 1- Verbalizes Understanding   Class Start Time 1400   Class Stop Time 1452   Class Time Calculation (min) 52 min            Core Videos: Exercise    Move It!  Clinical staff conducted group or individual video education with verbal and written material and guidebook.  Patient learns the recommended Pritikin exercise program. Exercise with the goal of living a long, healthy life. Some of the health benefits of exercise include controlled diabetes, healthier blood pressure levels, improved cholesterol levels, improved heart and lung capacity, improved sleep, and better body composition. Everyone should speak with their doctor before starting or changing an exercise routine.  Biomechanical Limitations Clinical staff conducted group or individual video education with verbal and written material and guidebook.  Patient learns how biomechanical limitations can impact exercise and how we can mitigate and possibly overcome limitations to have an impactful and balanced exercise routine.  Body Composition Clinical staff conducted group or individual video education with verbal and written material and guidebook.  Patient learns that body composition (ratio of muscle mass to fat mass) is a key component to assessing overall fitness, rather than body weight alone. Increased fat mass, especially visceral belly fat, can put [place] at increased risk for metabolic syndrome, type 2 diabetes, heart disease, and even death. It is recommended to combine diet and exercise (cardiovascular and resistance training) to  improve your body composition. Seek guidance from your physician and exercise physiologist before implementing an exercise routine.  Exercise Action Plan Clinical staff conducted group or individual video education with verbal and written material and guidebook.  Patient learns the recommended strategies to achieve and enjoy long-term exercise adherence, including variety, self-motivation, self-efficacy, and positive decision making. Benefits of exercise include fitness, good health, weight management, more energy, better sleep, less stress, and overall well-being.  Medical   Heart Disease Risk Reduction Clinical staff conducted group or individual video education with verbal and written material and  guidebook.  Patient learns our heart is our most vital organ as it circulates oxygen, nutrients, white blood cells, and hormones throughout the entire body, and carries waste away. Data supports a plant-based eating plan like the Pritikin Program for its effectiveness in slowing progression of and reversing heart disease. The video provides a number of recommendations to address heart disease.   Metabolic Syndrome and Belly Fat  Clinical staff conducted group or individual video education with verbal and written material and guidebook.  Patient learns what metabolic syndrome is, how it leads to heart disease, and how one can reverse it and keep it from coming back. You have metabolic syndrome if you have 3 of the following 5 criteria: abdominal obesity, high blood pressure, high triglycerides, low HDL cholesterol, and high blood sugar.  Hypertension and Heart Disease Clinical staff conducted group or individual video education with verbal and written material and guidebook.  Patient learns that high blood pressure, or hypertension, is very common in the [place]. Hypertension is largely due to excessive salt intake, but other important risk factors include being overweight, physical inactivity,  drinking too much alcohol, smoking, and not eating enough potassium from fruits and vegetables. High blood pressure is a leading risk factor for heart attack, stroke, congestive heart failure, dementia, kidney failure, and premature death. Long-term effects of excessive salt intake include stiffening of the arteries and thickening of heart muscle and organ damage. Recommendations include ways to reduce hypertension and the risk of heart disease.  Diseases of Our Time - Focusing on Diabetes Clinical staff conducted group or individual video education with verbal and written material and guidebook.  Patient learns why the best way to stop diseases of our time is prevention, through food and other lifestyle changes. Medicine (such as prescription pills and surgeries) is often only a Band-Aid on the problem, not a long-term solution. Most common diseases of our time include obesity, type 2 diabetes, hypertension, heart disease, and cancer. The Pritikin Program is recommended and has been proven to help reduce, reverse, and/or prevent the damaging effects of metabolic syndrome.  Nutrition   Overview of the Pritikin Eating Plan  Clinical staff conducted group or individual video education with verbal and written material and guidebook.  Patient learns about the Pritikin Eating Plan for disease risk reduction. The Pritikin Eating Plan emphasizes a wide variety of unrefined, minimally-processed carbohydrates, like fruits, vegetables, whole grains, and legumes. Go, Caution, and Stop food choices are explained. Plant-based and lean animal proteins are emphasized. Rationale provided for low sodium intake for blood pressure control, low added sugars for blood sugar stabilization, and low added fats and oils for coronary artery disease risk reduction and weight management.  Calorie Density  Clinical staff conducted group or individual video education with verbal and written material and guidebook.  Patient learns  about calorie density and how it impacts the Pritikin Eating Plan. Knowing the characteristics of the food you choose will help you decide whether those foods will lead to weight gain or weight loss, and whether you want to consume more or less of them. Weight loss is usually a side effect of the Pritikin Eating Plan because of its focus on low calorie-dense foods.  Label Reading  Clinical staff conducted group or individual video education with verbal and written material and guidebook.  Patient learns about the Pritikin recommended label reading guidelines and corresponding recommendations regarding calorie density, added sugars, sodium content, and whole grains.  Dining Out - Part 1  Clinical  staff conducted group or individual video education with verbal and written material and guidebook.  Patient learns that restaurant meals can be sabotaging because they can be so high in calories, fat, sodium, and/or sugar. Patient learns recommended strategies on how to positively address this and avoid unhealthy pitfalls.  Facts on Fats  Clinical staff conducted group or individual video education with verbal and written material and guidebook.  Patient learns that lifestyle modifications can be just as effective, if not more so, as many medications for lowering your risk of heart disease. A Pritikin lifestyle can help to reduce your risk of inflammation and atherosclerosis (cholesterol build-up, or plaque, in the artery walls). Lifestyle interventions such as dietary choices and physical activity address the cause of atherosclerosis. A review of the types of fats and their impact on blood cholesterol levels, along with dietary recommendations to reduce fat intake is also included.  Nutrition Action Plan  Clinical staff conducted group or individual video education with verbal and written material and guidebook.  Patient learns how to incorporate Pritikin recommendations into their lifestyle.  Recommendations include planning and keeping personal health goals in mind as an important part of their success.  Healthy Mind-Set    Healthy Minds, Bodies, Hearts  Clinical staff conducted group or individual video education with verbal and written material and guidebook.  Patient learns how to identify when they are stressed. Video will discuss the impact of that stress, as well as the many benefits of stress management. Patient will also be introduced to stress management techniques. The way we think, act, and feel has an impact on our hearts.  How Our Thoughts Can Heal Our Hearts  Clinical staff conducted group or individual video education with verbal and written material and guidebook.  Patient learns that negative thoughts can cause depression and anxiety. This can result in negative lifestyle behavior and serious health problems. Cognitive behavioral therapy is an effective method to help control our thoughts in order to change and improve our emotional outlook.  Additional Videos:  Exercise    Improving Performance  Clinical staff conducted group or individual video education with verbal and written material and guidebook.  Patient learns to use a non-linear approach by alternating intensity levels and lengths of time spent exercising to help burn more calories and lose more body fat. Cardiovascular exercise helps improve heart health, metabolism, hormonal balance, blood sugar control, and recovery from fatigue. Resistance training improves strength, endurance, balance, coordination, reaction time, metabolism, and muscle mass. Flexibility exercise improves circulation, posture, and balance. Seek guidance from your physician and exercise physiologist before implementing an exercise routine and learn your capabilities and proper form for all exercise.  Introduction to Yoga  Clinical staff conducted group or individual video education with verbal and written material and guidebook.   Patient learns about yoga, a discipline of the coming together of mind, breath, and body. The benefits of yoga include improved flexibility, improved range of motion, better posture and core strength, increased lung function, weight loss, and positive self-image. Yoga's heart health benefits include lowered blood pressure, healthier heart rate, decreased cholesterol and triglyceride levels, improved immune function, and reduced stress. Seek guidance from your physician and exercise physiologist before implementing an exercise routine and learn your capabilities and proper form for all exercise.  Medical   Aging: Enhancing Your Quality of Life  Clinical staff conducted group or individual video education with verbal and written material and guidebook.  Patient learns key strategies and recommendations to stay in  good physical health and enhance quality of life, such as prevention strategies, having an advocate, securing a Health Care Proxy and Power of Attorney, and keeping a list of medications and system for tracking them. It also discusses how to avoid risk for bone loss.  Biology of Weight Control  Clinical staff conducted group or individual video education with verbal and written material and guidebook.  Patient learns that weight gain occurs because we consume more calories than we burn (eating more, moving less). Even if your body weight is normal, you may have higher ratios of fat compared to muscle mass. Too much body fat puts you at increased risk for cardiovascular disease, heart attack, stroke, type 2 diabetes, and obesity-related cancers. In addition to exercise, following the Pritikin Eating Plan can help reduce your risk.  Decoding Lab Results  Clinical staff conducted group or individual video education with verbal and written material and guidebook.  Patient learns that lab test reflects one measurement whose values change over time and are influenced by many factors, including  medication, stress, sleep, exercise, food, hydration, pre-existing medical conditions, and more. It is recommended to use the knowledge from this video to become more involved with your lab results and evaluate your numbers to speak with your doctor.   Diseases of Our Time - Overview  Clinical staff conducted group or individual video education with verbal and written material and guidebook.  Patient learns that according to the CDC, 50% to 70% of chronic diseases (such as obesity, type 2 diabetes, elevated lipids, hypertension, and heart disease) are avoidable through lifestyle improvements including healthier food choices, listening to satiety cues, and increased physical activity.  Sleep Disorders Clinical staff conducted group or individual video education with verbal and written material and guidebook.  Patient learns how good quality and duration of sleep are important to overall health and well-being. Patient also learns about sleep disorders and how they impact health along with recommendations to address them, including discussing with a physician.  Nutrition  Dining Out - Part 2 Clinical staff conducted group or individual video education with verbal and written material and guidebook.  Patient learns how to plan ahead and communicate in order to maximize their dining experience in a healthy and nutritious manner. Included are recommended food choices based on the type of restaurant the patient is visiting.   Fueling a Banker conducted group or individual video education with verbal and written material and guidebook.  There is a strong connection between our food choices and our health. Diseases like obesity and type 2 diabetes are very prevalent and are in large-part due to lifestyle choices. The Pritikin Eating Plan provides plenty of food and hunger-curbing satisfaction. It is easy to follow, affordable, and helps reduce health risks.  Menu Workshop  Clinical  staff conducted group or individual video education with verbal and written material and guidebook.  Patient learns that restaurant meals can sabotage health goals because they are often packed with calories, fat, sodium, and sugar. Recommendations include strategies to plan ahead and to communicate with the manager, chef, or server to help order a healthier meal.  Planning Your Eating Strategy  Clinical staff conducted group or individual video education with verbal and written material and guidebook.  Patient learns about the Pritikin Eating Plan and its benefit of reducing the risk of disease. The Pritikin Eating Plan does not focus on calories. Instead, it emphasizes high-quality, nutrient-rich foods. By knowing the characteristics of the foods, we  choose, we can determine their calorie density and make informed decisions.  Targeting Your Nutrition Priorities  Clinical staff conducted group or individual video education with verbal and written material and guidebook.  Patient learns that lifestyle habits have a tremendous impact on disease risk and progression. This video provides eating and physical activity recommendations based on your personal health goals, such as reducing LDL cholesterol, losing weight, preventing or controlling type 2 diabetes, and reducing high blood pressure.  Vitamins and Minerals  Clinical staff conducted group or individual video education with verbal and written material and guidebook.  Patient learns different ways to obtain key vitamins and minerals, including through a recommended healthy diet. It is important to discuss all supplements you take with your doctor.   Healthy Mind-Set    Smoking Cessation  Clinical staff conducted group or individual video education with verbal and written material and guidebook.  Patient learns that cigarette smoking and tobacco addiction pose a serious health risk which affects millions of people. Stopping smoking will  significantly reduce the risk of heart disease, lung disease, and many forms of cancer. Recommended strategies for quitting are covered, including working with your doctor to develop a successful plan.  Culinary   Becoming a Set designer conducted group or individual video education with verbal and written material and guidebook.  Patient learns that cooking at home can be healthy, cost-effective, quick, and puts them in control. Keys to cooking healthy recipes will include looking at your recipe, assessing your equipment needs, planning ahead, making it simple, choosing cost-effective seasonal ingredients, and limiting the use of added fats, salts, and sugars.  Cooking - Breakfast and Snacks  Clinical staff conducted group or individual video education with verbal and written material and guidebook.  Patient learns how important breakfast is to satiety and nutrition through the entire day. Recommendations include key foods to eat during breakfast to help stabilize blood sugar levels and to prevent overeating at meals later in the day. Planning ahead is also a key component.  Cooking - Educational psychologist conducted group or individual video education with verbal and written material and guidebook.  Patient learns eating strategies to improve overall health, including an approach to cook more at home. Recommendations include thinking of animal protein as a side on your plate rather than center stage and focusing instead on lower calorie dense options like vegetables, fruits, whole grains, and plant-based proteins, such as beans. Making sauces in large quantities to freeze for later and leaving the skin on your vegetables are also recommended to maximize your experience.  Cooking - Healthy Salads and Dressing Clinical staff conducted group or individual video education with verbal and written material and guidebook.  Patient learns that vegetables, fruits, whole grains,  and legumes are the foundations of the Pritikin Eating Plan. Recommendations include how to incorporate each of these in flavorful and healthy salads, and how to create homemade salad dressings. Proper handling of ingredients is also covered. Cooking - Soups and [employer] - Soups and Desserts Clinical staff conducted group or individual video education with verbal and written material and guidebook.  Patient learns that Pritikin soups and desserts make for easy, nutritious, and delicious snacks and meal components that are low in sodium, fat, sugar, and calorie density, while high in vitamins, minerals, and filling fiber. Recommendations include simple and healthy ideas for soups and desserts.   Overview     The Pritikin Solution Program Overview Clinical staff  conducted group or individual video education with verbal and written material and guidebook.  Patient learns that the results of the Pritikin Program have been documented in more than 100 articles published in peer-reviewed journals, and the benefits include reducing risk factors for (and, in some cases, even reversing) high cholesterol, high blood pressure, type 2 diabetes, obesity, and more! An overview of the three key pillars of the Pritikin Program will be covered: eating well, doing regular exercise, and having a healthy mind-set.  WORKSHOPS  Exercise: Exercise Basics: Building Your Action Plan Clinical staff led group instruction and group discussion with PowerPoint presentation and patient guidebook. To enhance the learning environment the use of posters, models and videos may be added. At the conclusion of this workshop, patients will comprehend the difference between physical activity and exercise, as well as the benefits of incorporating both, into their routine. Patients will understand the FITT (Frequency, Intensity, Time, and Type) principle and how to use it to build an exercise action plan. In addition, safety  concerns and other considerations for exercise and cardiac rehab will be addressed by the presenter. The purpose of this lesson is to promote a comprehensive and effective weekly exercise routine in order to improve patients' overall level of fitness.   Managing Heart Disease: Your Path to a Healthier Heart Clinical staff led group instruction and group discussion with PowerPoint presentation and patient guidebook. To enhance the learning environment the use of posters, models and videos may be added.At the conclusion of this workshop, patients will understand the anatomy and physiology of the heart. Additionally, they will understand how Pritikin's three pillars impact the risk factors, the progression, and the management of heart disease.  The purpose of this lesson is to provide a high-level overview of the heart, heart disease, and how the Pritikin lifestyle positively impacts risk factors.  Exercise Biomechanics Clinical staff led group instruction and group discussion with PowerPoint presentation and patient guidebook. To enhance the learning environment the use of posters, models and videos may be added. Patients will learn how the structural parts of their bodies function and how these functions impact their daily activities, movement, and exercise. Patients will learn how to promote a neutral spine, learn how to manage pain, and identify ways to improve their physical movement in order to promote healthy living. The purpose of this lesson is to expose patients to common physical limitations that impact physical activity. Participants will learn practical ways to adapt and manage aches and pains, and to minimize their effect on regular exercise. Patients will learn how to maintain good posture while sitting, walking, and lifting.  Balance Training and Fall Prevention  Clinical staff led group instruction and group discussion with PowerPoint presentation and patient guidebook. To  enhance the learning environment the use of posters, models and videos may be added. At the conclusion of this workshop, patients will understand the importance of their sensorimotor skills (vision, proprioception, and the vestibular system) in maintaining their ability to balance as they age. Patients will apply a variety of balancing exercises that are appropriate for their current level of function. Patients will understand the common causes for poor balance, possible solutions to these problems, and ways to modify their physical environment in order to minimize their fall risk. The purpose of this lesson is to teach patients about the importance of maintaining balance as they age and ways to minimize their risk of falling.  WORKSHOPS   Nutrition:  Fueling a Ship broker led group  instruction and group discussion with PowerPoint presentation and patient guidebook. To enhance the learning environment the use of posters, models and videos may be added. Patients will review the foundational principles of the Pritikin Eating Plan and understand what constitutes a serving size in each of the food groups. Patients will also learn Pritikin-friendly foods that are better choices when away from home and review make-ahead meal and snack options. Calorie density will be reviewed and applied to three nutrition priorities: weight maintenance, weight loss, and weight gain. The purpose of this lesson is to reinforce (in a group setting) the key concepts around what patients are recommended to eat and how to apply these guidelines when away from home by planning and selecting Pritikin-friendly options. Patients will understand how calorie density may be adjusted for different weight management goals.  Mindful Eating  Clinical staff led group instruction and group discussion with PowerPoint presentation and patient guidebook. To enhance the learning environment the use of posters, models and videos may  be added. Patients will briefly review the concepts of the Pritikin Eating Plan and the importance of low-calorie dense foods. The concept of mindful eating will be introduced as well as the importance of paying attention to internal hunger signals. Triggers for non-hunger eating and techniques for dealing with triggers will be explored. The purpose of this lesson is to provide patients with the opportunity to review the basic principles of the Pritikin Eating Plan, discuss the value of eating mindfully and how to measure internal cues of hunger and fullness using the Hunger Scale. Patients will also discuss reasons for non-hunger eating and learn strategies to use for controlling emotional eating.  Targeting Your Nutrition Priorities Clinical staff led group instruction and group discussion with PowerPoint presentation and patient guidebook. To enhance the learning environment the use of posters, models and videos may be added. Patients will learn how to determine their genetic susceptibility to disease by reviewing their family history. Patients will gain insight into the importance of diet as part of an overall healthy lifestyle in mitigating the impact of genetics and other environmental insults. The purpose of this lesson is to provide patients with the opportunity to assess their personal nutrition priorities by looking at their family history, their own health history and current risk factors. Patients will also be able to discuss ways of prioritizing and modifying the [facility] Eating Plan for their highest risk areas  Menu  Clinical staff led group instruction and group discussion with PowerPoint presentation and patient guidebook. To enhance the learning environment the use of posters, models and videos may be added. Using menus brought in from [employer], or printed from [employer], patients will apply the [facility] dining out guidelines that were presented in the [employer] video. Patients will also be able to practice these guidelines in a variety of provided scenarios. The purpose of this lesson is to provide patients with the opportunity to practice hands-on learning of the [facility] Dining Out guidelines with actual menus and practice scenarios.  Label Reading Clinical staff led group instruction and group discussion with PowerPoint presentation and patient guidebook. To enhance the learning environment the use of posters, models and videos may be added. Patients will review and discuss the [facility]in label reading guidelines presented in [employer]'s Label Reading Educational series video. Using fool labels brought in from local grocery stores and markets, patients will apply the label reading guidelines and determine if the packaged food meet the Pritikin guidelines. The purpose of  this lesson is to provide patients with the opportunity to review, discuss, and practice hands-on learning of the [facility] Label Reading guidelines with actual packaged food labels. Cooking School  [facility]'s [employer] are designed to teach patients ways to prepare quick, simple, and affordable recipes at home. The importance of nutrition's role in chronic disease risk reduction is reflected in its emphasis in the overall [facility] program. By learning how to prepare essential core P[facility]in Eating Plan recipes, patients will increase control over what they eat; be able to customize the flavor of foods without the use of added salt, sugar, or fat; and improve the quality of the food they consume. By learning a set of core recipes which are easily assembled, quickly prepared, and affordable, patients are more likely to prepare more healthy foods at home. These workshops focus on convenient breakfasts, simple entres, side dishes, and desserts which can be prepared with minimal effort and are consistent with nutrition recommendations for cardiovascular risk reduction. Cooking  [employer] are taught by a Armed forces logistics/support/administrative officer (RD) who has been trained by the [employer]. The chef or RD has a clear understanding of the importance of minimizing - if not completely eliminating - added fat, sugar, and sodium in recipes. Throughout the series of Cooking School Workshop sessions, patients will learn about healthy ingredients and efficient methods of cooking to build confidence in their capability to prepare    Cooking School weekly topics:  Adding Flavor- Sodium-Free  Fast and Healthy Breakfasts  Powerhouse Plant-Based Proteins  Satisfying Salads and Dressings  Simple Sides and Sauces  International Cuisine-Spotlight on the [employer] Zones  Delicious Desserts  Savory Soups  [employer] - Meals in a Astronomer Appetizers and Snacks  Comforting Weekend Breakfasts  One-Pot Wonders   Fast Evening Meals  Landscape architect Your Pritikin Plate  WORKSHOPS   Healthy Mindset (Psychosocial):  Focused Goals, Sustainable Changes Clinical staff led group instruction and group discussion with PowerPoint presentation and patient guidebook. To enhance the learning environment the use of posters, models and videos may be added. Patients will be able to apply effective goal setting strategies to establish at least one personal goal, and then take consistent, meaningful action toward that goal. They will learn to identify common barriers to achieving personal goals and develop strategies to overcome them. Patients will also gain an understanding of how our mind-set can impact our ability to achieve goals and the importance of cultivating a positive and growth-oriented mind-set. The purpose of this lesson is to provide patients with a deeper understanding of how to set and achieve personal goals, as well as the tools and strategies needed to overcome common obstacles which may arise along the way.  From Head to Heart: The Power of a Healthy  Outlook  Clinical staff led group instruction and group discussion with PowerPoint presentation and patient guidebook. To enhance the learning environment the use of posters, models and videos may be added. Patients will be able to recognize and describe the impact of emotions and mood on physical health. They will discover the importance of self-care and explore self-care practices which may work for them. Patients will also learn how to utilize the 4 C's to cultivate a healthier outlook and better manage stress and challenges. The purpose of this lesson is to demonstrate to patients how a healthy outlook is an essential part of maintaining good health, especially as they continue their cardiac rehab journey.  Healthy Sleep  for a Healthy Heart Clinical staff led group instruction and group discussion with PowerPoint presentation and patient guidebook. To enhance the learning environment the use of posters, models and videos may be added. At the conclusion of this workshop, patients will be able to demonstrate knowledge of the importance of sleep to overall health, well-being, and quality of life. They will understand the symptoms of, and treatments for, common sleep disorders. Patients will also be able to identify daytime and nighttime behaviors which impact sleep, and they will be able to apply these tools to help manage sleep-related challenges. The purpose of this lesson is to provide patients with a general overview of sleep and outline the importance of quality sleep. Patients will learn about a few of the most common sleep disorders. Patients will also be introduced to the concept of "sleep hygiene," and discover ways to self-manage certain sleeping problems through simple daily behavior changes. Finally, the workshop will motivate patients by clarifying the links between quality sleep and their goals of heart-healthy living.   Recognizing and Reducing Stress Clinical staff led group instruction and  group discussion with PowerPoint presentation and patient guidebook. To enhance the learning environment the use of posters, models and videos may be added. At the conclusion of this workshop, patients will be able to understand the types of stress reactions, differentiate between acute and chronic stress, and recognize the impact that chronic stress has on their health. They will also be able to apply different coping mechanisms, such as reframing negative self-talk. Patients will have the opportunity to practice a variety of stress management techniques, such as deep abdominal breathing, progressive muscle relaxation, and/or guided imagery.  The purpose of this lesson is to educate patients on the role of stress in their lives and to provide healthy techniques for coping with it.  Learning Barriers/Preferences:  Learning Barriers/Preferences - 08/28/22 1612       Learning Barriers/Preferences   Learning Barriers Sight   wears glasses.   Learning Preferences Audio;Skilled Demonstration             Education Topics:  Knowledge Questionnaire Score:  Knowledge Questionnaire Score - 08/28/22 1438       Knowledge Questionnaire Score   Pre Score 22/24             Core Components/Risk Factors/Patient Goals at Admission:  Personal Goals and Risk Factors at Admission - 08/28/22 1442       Core Components/Risk Factors/Patient Goals on Admission    Weight Management Yes;Obesity;Weight Loss    Intervention Weight Management/Obesity: Establish reasonable short term and long term weight goals.;Obesity: Provide education and appropriate resources to help participant work on and attain dietary goals.    Admit Weight 209 lb 10.5 oz (95.1 kg)    Expected Outcomes Short Term: Continue to assess and modify interventions until short term weight is achieved;Long Term: Adherence to nutrition and physical activity/exercise program aimed toward attainment of established weight goal;Weight Loss:  Understanding of general recommendations for a balanced deficit meal plan, which promotes 1-2 lb weight loss per week and includes a negative energy balance of [phone] kcal/d    Diabetes Yes    Intervention Provide education about signs/symptoms and action to take for hypo/hyperglycemia.;Provide education about proper nutrition, including hydration, and aerobic/resistive exercise prescription along with prescribed medications to achieve blood glucose in normal ranges: Fasting glucose 65-99 mg/dL    Expected Outcomes Short Term: Participant verbalizes understanding of the signs/symptoms and immediate care of hyper/hypoglycemia, proper foot care and  importance of medication, aerobic/resistive exercise and nutrition plan for blood glucose control.;Long Term: Attainment of HbA1C < 7%.    Heart Failure Yes    Intervention Provide a combined exercise and nutrition program that is supplemented with education, support and counseling about heart failure. Directed toward relieving symptoms such as shortness of breath, decreased exercise tolerance, and extremity edema.    Expected Outcomes Improve functional capacity of life;Short term: Attendance in program 2-3 days a week with increased exercise capacity. Reported lower sodium intake. Reported increased fruit and vegetable intake. Reports medication compliance.;Short term: Daily weights obtained and reported for increase. Utilizing diuretic protocols set by physician.;Long term: Adoption of self-care skills and reduction of barriers for early signs and symptoms recognition and intervention leading to self-care maintenance.    Hypertension Yes    Intervention Provide education on lifestyle modifcations including regular physical activity/exercise, weight management, moderate sodium restriction and increased consumption of fresh fruit, vegetables, and low fat dairy, alcohol moderation, and smoking cessation.;Monitor prescription use compliance.    Expected Outcomes  Short Term: Continued assessment and intervention until BP is < 140/29mm HG in hypertensive participants. < 130/67mm HG in hypertensive participants with diabetes, heart failure or chronic kidney disease.;Long Term: Maintenance of blood pressure at goal levels.    Lipids Yes    Intervention Provide education and support for participant on nutrition & aerobic/resistive exercise along with prescribed medications to achieve LDL 70mg , HDL >40mg .    Expected Outcomes Short Term: Participant states understanding of desired cholesterol values and is compliant with medications prescribed. Participant is following exercise prescription and nutrition guidelines.;Long Term: Cholesterol controlled with medications as prescribed, with individualized exercise RX and with personalized nutrition plan. Value goals: LDL < 70mg , HDL > 40 mg.    Stress Yes    Intervention Offer individual and/or small group education and counseling on adjustment to heart disease, stress management and health-related lifestyle change. Teach and support self-help strategies.    Expected Outcomes Short Term: Participant demonstrates changes in health-related behavior, relaxation and other stress management skills, ability to obtain effective social support, and compliance with psychotropic medications if prescribed.;Long Term: Emotional wellbeing is indicated by absence of clinically significant psychosocial distress or social isolation.             Core Components/Risk Factors/Patient Goals Review:   Goals and Risk Factor Review     Row Name 09/05/22 1706 10/02/22 1405           Core Components/Risk Factors/Patient Goals Review   Personal Goals Review Weight Management/Obesity;Heart Failure;Stress;Hypertension;Lipids Weight Management/Obesity;Heart Failure;Stress;Hypertension;Lipids      Review Jadira started cardiac rehab on 09/05/22 and did well with exercise. Vital signs and CBG's were stable Dannely is doing well with  exercise at cardiac rehab . Vital signs and CBG's have been stable. Reneka has lost 1.3 kg since starting the program.      Expected Outcomes Latonya will continue to participate in cardiac rehab for exercise, nutrtion and lifestyle modifcations. Anielle will continue to participate in cardiac rehab for exercise, nutrtion and lifestyle modifcations.               Core Components/Risk Factors/Patient Goals at Discharge (Final Review):   Goals and Risk Factor Review - 10/02/22 1405       Core Components/Risk Factors/Patient Goals Review   Personal Goals Review Weight Management/Obesity;Heart Failure;Stress;Hypertension;Lipids    Review Pansie is doing well with exercise at cardiac rehab . Vital signs and CBG's have been stable. Kai has lost 1.3  kg since starting the program.    Expected Outcomes Raylena will continue to participate in cardiac rehab for exercise, nutrtion and lifestyle modifcations.             ITP Comments:  ITP Comments     Row Name 08/28/22 1311 09/05/22 1705 10/02/22 1358       ITP Comments Medical Director- Dr. Armanda Magic, MD. Introduction to the [facility] Education Program / Intensive Cardiac Rehab. Reviewed initial orientation folder with patient. 30 Day ITP Review. Khaliyah started cardiac rehab on 09/05/22 and did well with exercise 30 Day ITP Review. Skilar has good attendance and participation in cardiac rehab              Comments: See ITP comments.Thayer Headings RN BSN

## 2022-10-03 ENCOUNTER — Ambulatory Visit (HOSPITAL_COMMUNITY): Payer: 59

## 2022-10-03 ENCOUNTER — Encounter (HOSPITAL_COMMUNITY)
Admission: RE | Admit: 2022-10-03 | Discharge: 2022-10-03 | Disposition: A | Discharge status: Home / Self Care | Payer: 59 | Source: Ambulatory Visit | Attending: Cardiology | Admitting: Cardiology

## 2022-10-03 DIAGNOSIS — I5022 Chronic systolic (congestive) heart failure: Secondary | ICD-10-CM

## 2022-10-04 IMAGING — DX DG CHEST 1V PORT
1 series · 1 of 1 positions shown · non-contrast
Comparison: 11/11/2018

CLINICAL DATA: Shortness of breath

EXAM:
PORTABLE CHEST 1 VIEW

[chest ap]
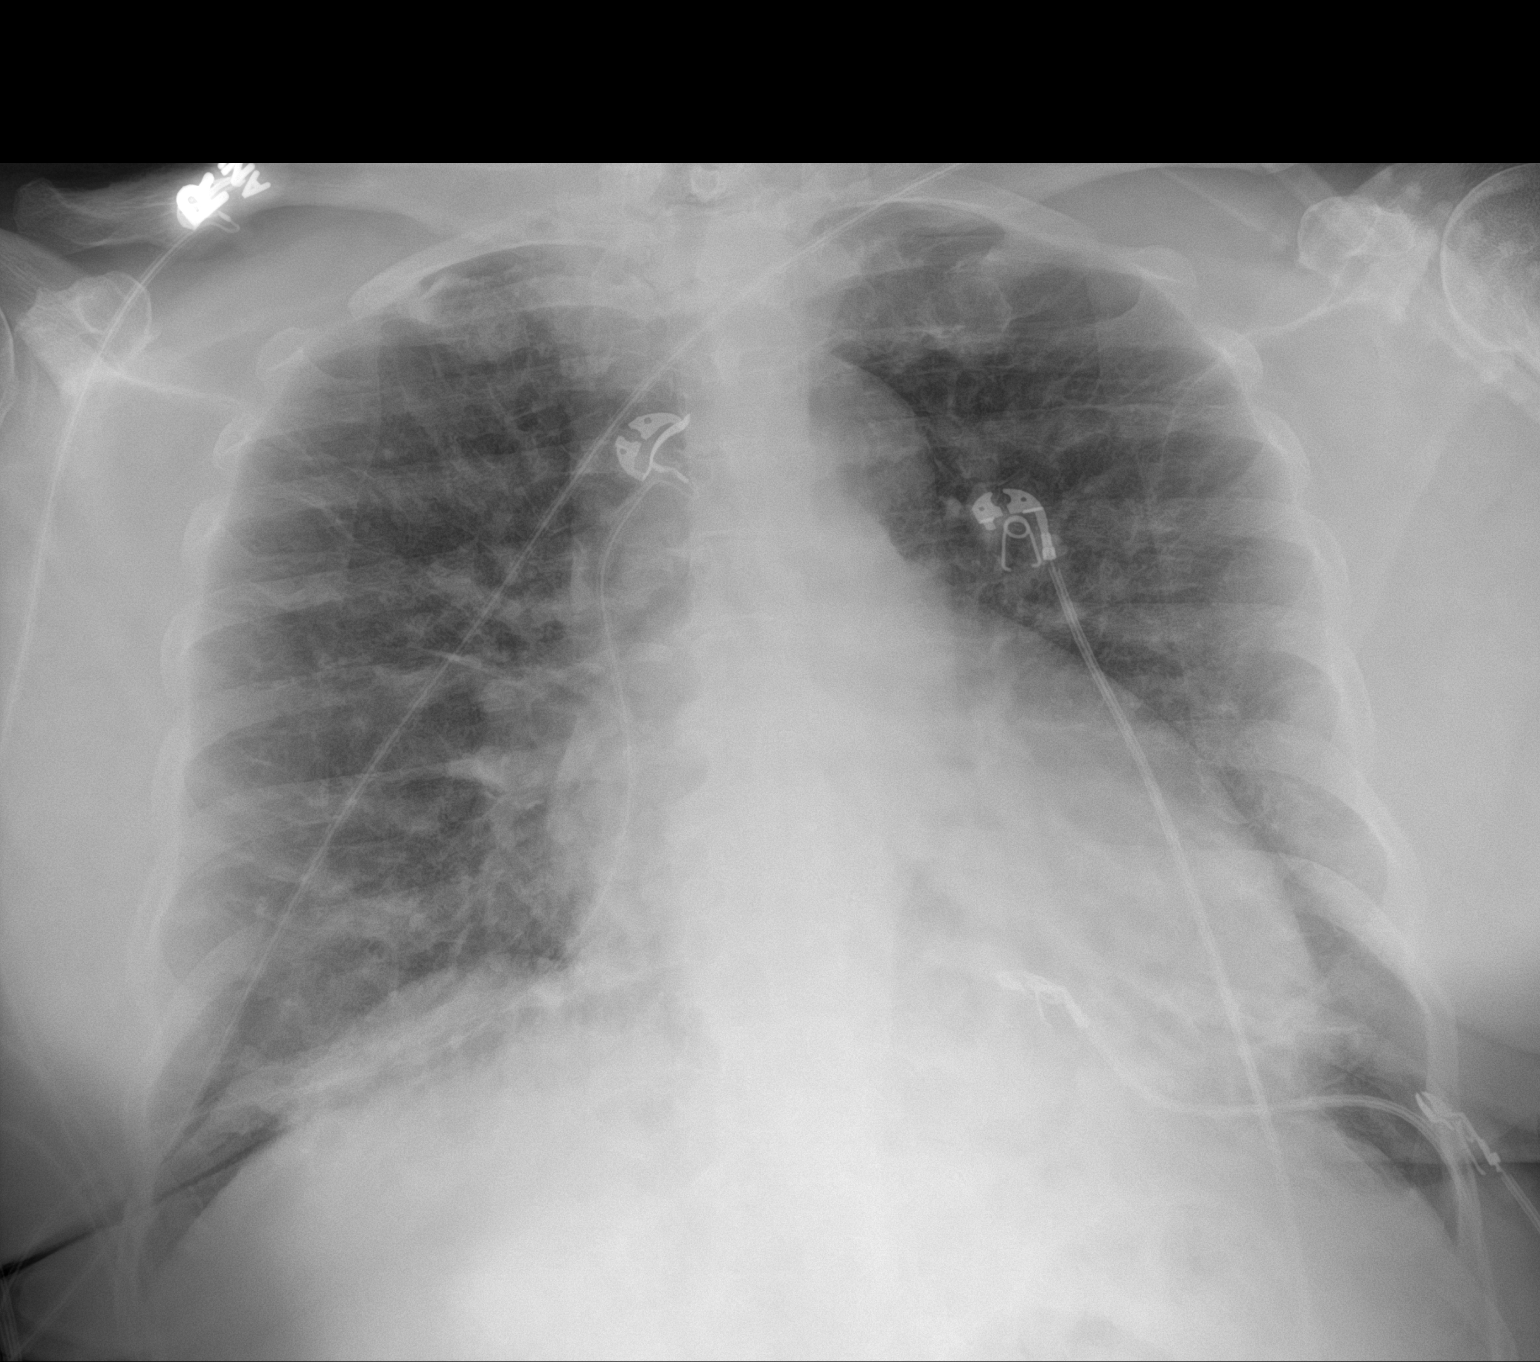

[1 of 1 positions shown; findings below may reference images not displayed]

FINDINGS: Cardiomegaly with vascular congestion. Interstitial prominence may
reflect early interstitial edema. Bibasilar atelectasis. No
effusions or acute bony abnormality.
IMPRESSION: Cardiomegaly, vascular congestion and possible early interstitial
edema.

Bibasilar atelectasis.

## 2022-10-05 ENCOUNTER — Encounter (HOSPITAL_COMMUNITY): Payer: 59

## 2022-10-05 ENCOUNTER — Ambulatory Visit (HOSPITAL_COMMUNITY): Payer: 59

## 2022-10-10 ENCOUNTER — Encounter (HOSPITAL_COMMUNITY)
Admission: RE | Admit: 2022-10-10 | Discharge: 2022-10-10 | Disposition: A | Discharge status: Home / Self Care | Payer: 59 | Source: Ambulatory Visit | Attending: Cardiology | Admitting: Cardiology

## 2022-10-10 ENCOUNTER — Ambulatory Visit (HOSPITAL_COMMUNITY): Payer: 59

## 2022-10-10 DIAGNOSIS — I5022 Chronic systolic (congestive) heart failure: Secondary | ICD-10-CM | POA: Diagnosis not present

## 2022-10-10 LAB — GLUCOSE, CAPILLARY: Glucose-Capillary: 122 mg/dL — ABNORMAL HIGH (ref 70–99)

## 2022-10-12 ENCOUNTER — Encounter (HOSPITAL_COMMUNITY): Payer: 59

## 2022-10-12 ENCOUNTER — Ambulatory Visit (HOSPITAL_COMMUNITY): Payer: 59

## 2022-10-15 ENCOUNTER — Ambulatory Visit (HOSPITAL_COMMUNITY): Payer: 59

## 2022-10-15 ENCOUNTER — Encounter (HOSPITAL_COMMUNITY): Payer: 59

## 2022-10-16 ENCOUNTER — Telehealth (HOSPITAL_COMMUNITY): Payer: Self-pay | Admitting: *Deleted

## 2022-10-17 ENCOUNTER — Encounter (HOSPITAL_COMMUNITY)
Admission: RE | Admit: 2022-10-17 | Discharge: 2022-10-17 | Disposition: A | Discharge status: Home / Self Care | Payer: 59 | Source: Ambulatory Visit | Attending: Cardiology

## 2022-10-17 ENCOUNTER — Ambulatory Visit (HOSPITAL_COMMUNITY): Payer: 59

## 2022-10-17 DIAGNOSIS — I5022 Chronic systolic (congestive) heart failure: Secondary | ICD-10-CM

## 2022-10-19 ENCOUNTER — Encounter (HOSPITAL_COMMUNITY)
Admission: RE | Admit: 2022-10-19 | Discharge: 2022-10-19 | Disposition: A | Discharge status: Home / Self Care | Payer: 59 | Source: Ambulatory Visit | Attending: Cardiology | Admitting: Cardiology

## 2022-10-19 ENCOUNTER — Ambulatory Visit (HOSPITAL_COMMUNITY): Payer: 59

## 2022-10-19 DIAGNOSIS — I5022 Chronic systolic (congestive) heart failure: Secondary | ICD-10-CM | POA: Diagnosis not present

## 2022-10-22 ENCOUNTER — Ambulatory Visit (HOSPITAL_COMMUNITY): Payer: 59

## 2022-10-22 ENCOUNTER — Encounter (HOSPITAL_COMMUNITY)
Admission: RE | Admit: 2022-10-22 | Discharge: 2022-10-22 | Disposition: A | Discharge status: Home / Self Care | Payer: 59 | Source: Ambulatory Visit | Attending: Cardiology | Admitting: Cardiology

## 2022-10-22 DIAGNOSIS — I5022 Chronic systolic (congestive) heart failure: Secondary | ICD-10-CM

## 2022-10-24 ENCOUNTER — Ambulatory Visit (HOSPITAL_COMMUNITY): Payer: 59

## 2022-10-24 ENCOUNTER — Encounter (HOSPITAL_COMMUNITY)
Admission: RE | Admit: 2022-10-24 | Discharge: 2022-10-24 | Disposition: A | Discharge status: Home / Self Care | Payer: 59 | Source: Ambulatory Visit | Attending: Cardiology

## 2022-10-24 DIAGNOSIS — I5022 Chronic systolic (congestive) heart failure: Secondary | ICD-10-CM | POA: Diagnosis not present

## 2022-10-26 ENCOUNTER — Encounter (HOSPITAL_COMMUNITY)
Admission: RE | Admit: 2022-10-26 | Discharge: 2022-10-26 | Disposition: A | Discharge status: Home / Self Care | Payer: 59 | Source: Ambulatory Visit | Attending: Cardiology

## 2022-10-26 ENCOUNTER — Ambulatory Visit (HOSPITAL_COMMUNITY): Payer: 59

## 2022-10-26 DIAGNOSIS — I5022 Chronic systolic (congestive) heart failure: Secondary | ICD-10-CM

## 2022-10-29 ENCOUNTER — Ambulatory Visit (HOSPITAL_COMMUNITY): Payer: 59

## 2022-10-29 ENCOUNTER — Encounter (HOSPITAL_COMMUNITY): Payer: 59

## 2022-10-31 ENCOUNTER — Ambulatory Visit (HOSPITAL_COMMUNITY): Payer: 59

## 2022-10-31 ENCOUNTER — Encounter (HOSPITAL_COMMUNITY)
Admission: RE | Admit: 2022-10-31 | Discharge: 2022-10-31 | Disposition: A | Discharge status: Home / Self Care | Payer: 59 | Source: Ambulatory Visit | Attending: Cardiology | Admitting: Cardiology

## 2022-10-31 DIAGNOSIS — I5022 Chronic systolic (congestive) heart failure: Secondary | ICD-10-CM

## 2022-10-31 NOTE — Progress Notes (Signed)
Cardiac Individual Treatment Plan  Patient Details  Name: Stacey Fletcher MRN: [id number] Date of Birth: 05/14/1950 Referring Provider:   Flowsheet Row INTENSIVE CARDIAC REHAB ORIENT from 08/28/2022 in [facility]  Referring Provider Dorthula Nettles, DO       Initial Encounter Date:  Flowsheet Row INTENSIVE CARDIAC REHAB ORIENT from 08/28/2022 in [facility]  Date 08/28/22       Visit Diagnosis: Heart failure, chronic systolic (HCC)  Patient's Home Medications on Admission:  Current Outpatient Medications:    aspirin 81 MG chewable tablet, Chew 81 mg by mouth in the morning., Disp: , Rfl:    buPROPion (WELLBUTRIN XL) 300 MG 24 hr tablet, Take 300 mg by mouth every morning., Disp: , Rfl:    cetirizine (ZYRTEC) 10 MG tablet, Take 10 mg by mouth daily as needed for allergies., Disp: , Rfl:    dapagliflozin propanediol (FARXIGA) 10 MG TABS tablet, Take 1 tablet (10 mg total) by mouth daily., Disp: 90 tablet, Rfl: 3   digoxin (LANOXIN) 0.125 MG tablet, Take 1 tablet (0.125 mg total) by mouth daily., Disp: 90 tablet, Rfl: 3   diphenhydrAMINE (BENADRYL) 25 MG tablet, Take 25 mg by mouth at bedtime., Disp: , Rfl:    fluticasone (FLONASE) 50 MCG/ACT nasal spray, Place 1 spray into both nostrils daily as needed for allergies or rhinitis., Disp: , Rfl:    furosemide (LASIX) 40 MG tablet, Take 1 tablet (40 mg total) by mouth every other day., Disp: 45 tablet, Rfl: 3   gabapentin (NEURONTIN) 100 MG capsule, TAKE 1 CAPSULE(100 MG) BY MOUTH AT BEDTIME, Disp: 30 capsule, Rfl: 6   levothyroxine (SYNTHROID) 25 MCG tablet, Take 25 mcg by mouth daily before breakfast., Disp: , Rfl:    losartan (COZAAR) 100 MG tablet, Take 1 tablet (100 mg total) by mouth daily., Disp: 90 tablet, Rfl: 3   losartan (COZAAR) 50 MG tablet, Take 1 tablet (50 mg total) by mouth daily., Disp: 90 tablet, Rfl:  3   Menthol-Methyl Salicylate (MUSCLE RUB) 10-15 % CREA, Apply 1 Application topically 2 (two) times a week., Disp: , Rfl:    metFORMIN (GLUCOPHAGE) 1000 MG tablet, Take 1,000 mg by mouth daily., Disp: , Rfl:    metoprolol succinate (TOPROL-XL) 50 MG 24 hr tablet, Take 3 tablets (150 mg total) by mouth daily. Take with or immediately following a meal., Disp: 90 tablet, Rfl: 3   Potassium Chloride ER 20 MEQ TBCR, Take 1 tablet (20 mEq total) by mouth every other day., Disp: 45 tablet, Rfl: 3   pravastatin (PRAVACHOL) 20 MG tablet, Take 20 mg by mouth in the morning., Disp: , Rfl:    spironolactone (ALDACTONE) 25 MG tablet, Take 1 tablet (25 mg total) by mouth daily., Disp: 90 tablet, Rfl: 3   venlafaxine XR (EFFEXOR-XR) 75 MG 24 hr capsule, Take 75 mg by mouth in the morning, at noon, and at bedtime., Disp: , Rfl:   Past Medical History: Past Medical History:  Diagnosis Date   Coronary artery disease    Depression    Diabetes mellitus    Fibromyalgia    Goiter    Hypertension    MI (myocardial infarction) (HCC)    Osteoarthritis    Pneumonia    10+ years ago.    Tobacco Use: Social History   Tobacco Use  Smoking Status Former   Current packs/day: 0.00   Types: Cigarettes  Quit date: 07/07/2018   Years since quitting: 4.3   Passive exposure: Never  Smokeless Tobacco Never    Labs: Review Flowsheet  More data exists      Latest Ref Rng & Units 07/10/2009 05/02/2020 07/20/2021 07/21/2021 09/07/2022  Labs for ITP Cardiac and Pulmonary Rehab  Hemoglobin A1c 4.0 - 5.6 % - 6.8  6.7  - 7.4   PH, Arterial 7.35 - 7.45 - - - 7.427  -  PCO2 arterial 32 - 48 mmHg - - - 41.6  -  Bicarbonate 20.0 - 28.0 mmol/L - - - 27.4  29.3  28.5  -  TCO2 22 - 32 mmol/L 31  - - 29  31  30   -  O2 Saturation % - - - 96  56  58  -    Details       Multiple values from one day are sorted in reverse-chronological order         Capillary Blood Glucose: Lab Results  Component Value Date   GLUCAP 122  (H) 10/10/2022   GLUCAP 148 (H) 09/26/2022   GLUCAP 151 (H) 09/24/2022   GLUCAP 142 (H) 09/19/2022   GLUCAP 115 (H) 09/17/2022     Exercise Target Goals: Exercise Program Goal: Individual exercise prescription set using results from initial 6 min walk test and THRR while considering  patient's activity barriers and safety.   Exercise Prescription Goal: Initial exercise prescription builds to 30-45 minutes a day of aerobic activity, 2-3 days per week.  Home exercise guidelines will be given to patient during program as part of exercise prescription that the participant will acknowledge.  Activity Barriers & Risk Stratification:  Activity Barriers & Cardiac Risk Stratification - 08/28/22 1442       Activity Barriers & Cardiac Risk Stratification   Activity Barriers Arthritis;Other (comment);Fibromyalgia    Comments Chronic knee pain, per patient needs bilateral knee replacement. Severe OA. Has been told she has fibromyalgia.    Cardiac Risk Stratification High             6 Minute Walk:  6 Minute Walk     Row Name 08/28/22 1525         6 Minute Walk   Phase Initial     Distance 1205 feet     Walk Time 6 minutes     # of Rest Breaks 1  12-second standing rest break because nose was running     MPH 2.28     METS 2.89     RPE 7     Perceived Dyspnea  1     VO2 Peak 10.11     Symptoms Yes (comment)     Comments Mild SOB, runny nose     Resting HR 83 bpm     Resting BP 122/74     Resting Oxygen Saturation  97 %     Exercise Oxygen Saturation  during 6 min walk 98 %     Max Ex. HR 137 bpm     Max Ex. BP 146/72     2 Minute Post BP 138/74              Oxygen Initial Assessment:   Oxygen Re-Evaluation:   Oxygen Discharge (Final Oxygen Re-Evaluation):   Initial Exercise Prescription:  Initial Exercise Prescription - 08/28/22 1600       Date of Initial Exercise RX and Referring Provider   Date 08/28/22    Referring Provider Dorthula Nettles, DO  Expected Discharge Date 11/21/22      Recumbant Bike   Level 1    Watts 25    Minutes 15    METs 2.2      NuStep   Level 1    SPM 75    Minutes 15    METs 2.2      Prescription Details   Frequency (times per week) 3    Duration Progress to 30 minutes of continuous aerobic without signs/symptoms of physical distress      Intensity   THRR 40-80% of Max Heartrate 59-118    Ratings of Perceived Exertion 11-13    Perceived Dyspnea 0-4      Progression   Progression Continue to progress workloads to maintain intensity without signs/symptoms of physical distress.      Resistance Training   Training Prescription Yes    Weight 2 lbs    Reps 10-15             Perform Capillary Blood Glucose checks as needed.  Exercise Prescription Changes:   Exercise Prescription Changes     Row Name 09/05/22 1633 09/26/22 1649 10/10/22 1647 10/31/22 1617       Response to Exercise   Blood Pressure (Admit) 124/62 120/80 120/70 124/60    Blood Pressure (Exercise) 150/70 132/60 120/72 128/80    Blood Pressure (Exit) 104/68 94/60 112/64 122/64    Heart Rate (Admit) 85 bpm 73 bpm 74 bpm 83 bpm    Heart Rate (Exercise) 97 bpm 92 bpm 93 bpm 113 bpm    Heart Rate (Exit) 88 bpm 79 bpm 74 bpm 90 bpm    Rating of Perceived Exertion (Exercise) 10 10 11 11     Perceived Dyspnea (Exercise) 0 0 0 0    Symptoms none none none none    Comments Pt first day in the [facility] program Reviewed MET's and goals Reviewed MET's Reviewed MET's and goals    Duration Progress to 30 minutes of  aerobic without signs/symptoms of physical distress Progress to 30 minutes of  aerobic without signs/symptoms of physical distress Progress to 30 minutes of  aerobic without signs/symptoms of physical distress Progress to 30 minutes of  aerobic without signs/symptoms of physical distress    Intensity THRR unchanged THRR unchanged THRR unchanged THRR unchanged      Progression   Progression Continue to progress  workloads to maintain intensity without signs/symptoms of physical distress. Continue to progress workloads to maintain intensity without signs/symptoms of physical distress. Continue to progress workloads to maintain intensity without signs/symptoms of physical distress. Continue to progress workloads to maintain intensity without signs/symptoms of physical distress.    Average METs 1.4 2.1 1.9 2.2      Resistance Training   Training Prescription No No No No      NuStep   Level 1 2 2 4     SPM 60 71 75 80    Minutes 15 15 31 30     METs 1.8 2.1 1.8 2.2      Recumbant Elliptical   Level 1 -- -- --    RPM 21 -- -- --    Watts 22 -- -- --    Minutes 15 -- -- --    METs 1 -- -- --             Exercise Comments:   Exercise Comments     Row Name 09/05/22 1641 09/26/22 1626 10/10/22 1651 10/31/22 1621     Exercise Comments Pt first  day in the [facility] program. Pt tolerated exercise well with an average MET level of 1.4. Pt attempted recumbent bike, but was uncomfortable for her knees, Octane worked out much better. She felt good with her new ExRx and she is off to a good start. Will continue to increase workloads as tolerated without sign or symtpom Reviewed MET's and goals. Reviewed MET's today. Pt tolerated exercise well with an average MET level of 1.9. Pt is doing well, continuing to work on gradual increase and increased SPM Reviewed MET's and goals. Pt tolerated exercise well with an average MET level of 2.2. She feels good about her goals and is gaining strength and getting back into an exercise routine.             Exercise Goals and Review:   Exercise Goals     Row Name 08/28/22 1442             Exercise Goals   Increase Physical Activity Yes       Intervention Provide advice, education, support and counseling about physical activity/exercise needs.;Develop an individualized exercise prescription for aerobic and resistive training based on initial evaluation  findings, risk stratification, comorbidities and participant's personal goals.       Expected Outcomes Short Term: Attend rehab on a regular basis to increase amount of physical activity.;Long Term: Add in home exercise to make exercise part of routine and to increase amount of physical activity.;Long Term: Exercising regularly at least 3-5 days a week.       Increase Strength and Stamina Yes       Intervention Provide advice, education, support and counseling about physical activity/exercise needs.;Develop an individualized exercise prescription for aerobic and resistive training based on initial evaluation findings, risk stratification, comorbidities and participant's personal goals.       Expected Outcomes Long Term: Improve cardiorespiratory fitness, muscular endurance and strength as measured by increased METs and functional capacity ( );Short Term: Perform resistance training exercises routinely during rehab and add in resistance training at home;Short Term: Increase workloads from initial exercise prescription for resistance, speed, and METs.       Able to understand and use rate of perceived exertion (RPE) scale Yes       Intervention Provide education and explanation on how to use RPE scale       Expected Outcomes Short Term: Able to use RPE daily in rehab to express subjective intensity level;Long Term:  Able to use RPE to guide intensity level when exercising independently       Knowledge and understanding of Target Heart Rate Range (THRR) Yes       Intervention Provide education and explanation of THRR including how the numbers were predicted and where they are located for reference       Expected Outcomes Short Term: Able to state/look up THRR;Long Term: Able to use THRR to govern intensity when exercising independently;Short Term: Able to use daily as guideline for intensity in rehab       Able to check pulse independently Yes       Intervention Provide education and demonstration on how  to check pulse in carotid and radial arteries.;Review the importance of being able to check your own pulse for safety during independent exercise       Expected Outcomes Short Term: Able to explain why pulse checking is important during independent exercise;Long Term: Able to check pulse independently and accurately       Understanding of Exercise Prescription Yes  Intervention Provide education, explanation, and written materials on patient's individual exercise prescription       Expected Outcomes Short Term: Able to explain program exercise prescription;Long Term: Able to explain home exercise prescription to exercise independently                Exercise Goals Re-Evaluation :  Exercise Goals Re-Evaluation     Row Name 09/05/22 1638 09/26/22 1621 10/31/22 1620         Exercise Goal Re-Evaluation   Exercise Goals Review Increase Physical Activity;Understanding of Exercise Prescription;Increase Strength and Stamina;Knowledge and understanding of Target Heart Rate Range (THRR);Able to understand and use rate of perceived exertion (RPE) scale Increase Physical Activity;Understanding of Exercise Prescription;Increase Strength and Stamina;Knowledge and understanding of Target Heart Rate Range (THRR);Able to understand and use rate of perceived exertion (RPE) scale Increase Physical Activity;Understanding of Exercise Prescription;Increase Strength and Stamina;Knowledge and understanding of Target Heart Rate Range (THRR);Able to understand and use rate of perceived exertion (RPE) scale     Comments Pt first day in the [facility] program. Pt tolerated exercise well with an average MET level of 1.4. Pt attempted recumbent bike, but was uncomfortable for her knees, Octane worked out much better. She felt good with her new ExRx and she is off to a good start Reviewed MET's and goals. Pt tolerated exercise well with an average MET level of 2.1. Talked with patient about home ExRx, but she states  this is a lot for exercise right now. Will re-evaluated home ExRx soon. Pt states she feels good about her goals of getting moving and back into exercise. She is also increasing her strength and is able to do more, especially in the yard Reviewed MET's and goals. Pt tolerated exercise well with an average MET level of 2.2. She feels good about her goals and is gaining strength and getting back into an exercise routine.     Expected Outcomes Will continue to increase workloads as tolerated without sign or symtpom Will continue to increase workloads as tolerated without sign or symtpom Will continue to increase workloads as tolerated without sign or symtpom              Discharge Exercise Prescription (Final Exercise Prescription Changes):  Exercise Prescription Changes - 10/31/22 1617       Response to Exercise   Blood Pressure (Admit) 124/60    Blood Pressure (Exercise) 128/80    Blood Pressure (Exit) 122/64    Heart Rate (Admit) 83 bpm    Heart Rate (Exercise) 113 bpm    Heart Rate (Exit) 90 bpm    Rating of Perceived Exertion (Exercise) 11    Perceived Dyspnea (Exercise) 0    Symptoms none    Comments Reviewed MET's and goals    Duration Progress to 30 minutes of  aerobic without signs/symptoms of physical distress    Intensity THRR unchanged      Progression   Progression Continue to progress workloads to maintain intensity without signs/symptoms of physical distress.    Average METs 2.2      Resistance Training   Training Prescription No      NuStep   Level 4    SPM 80    Minutes 30    METs 2.2             Nutrition:  Target Goals: Understanding of nutrition guidelines, daily intake of sodium 1500mg , cholesterol 200mg , calories 30% from fat and 7% or less from saturated fats, daily to have  5 or more servings of fruits and vegetables.  Biometrics:  Pre Biometrics - 08/28/22 1311       Pre Biometrics   Waist Circumference 41 inches    Hip Circumference 45.75  inches    Waist to Hip Ratio 0.9 %    Triceps Skinfold 23 mm    % Body Fat 41.5 %    Grip Strength 28 kg    Flexibility 11.5 in    Single Leg Stand 9.18 seconds              Nutrition Therapy Plan and Nutrition Goals:  Nutrition Therapy & Goals - 10/03/22 1611       Nutrition Therapy   Diet Heart Healthy Diet    Drug/Food Interactions Statins/Certain Fruits      Personal Nutrition Goals   Nutrition Goal Patient to identify strategies for reducing cardiovascular risk by attending the [facility] education and nutrition series weekly.   goal in progress.   Personal Goal #2 Patient to improve diet quality by using the plate method as a guide for meal planning to include lean protein/plant protein, fruits, vegetables, whole grains, nonfat dairy as part of a well-balanced diet.   goal in progress.   Personal Goal #3 Patient to reduce sodium intake to 1500mg  per day.   goal in progress.   Comments Goals in progress. Nadezhda continues to attend the [employer] and nutrition series regularly. Seema reports motivation to learn more about nutrition for heart health/CHF. She reports better understanding of sodium, fiber, and saturated fat recommendations. She has maintained her weight since starting with our program.  Patient will benefit from participation in intensive cardiac rehab for nutrition, exercise, and lifestyle modification.      Intervention Plan   Intervention Prescribe, educate and counsel regarding individualized specific dietary modifications aiming towards targeted core components such as weight, hypertension, lipid management, diabetes, heart failure and other comorbidities.;Nutrition handout(s) given to patient.    Expected Outcomes Short Term Goal: Understand basic principles of dietary content, such as calories, fat, sodium, cholesterol and nutrients.;Long Term Goal: Adherence to prescribed nutrition plan.             Nutrition Assessments:  Nutrition  Assessments - 09/20/22 0827       Rate Your Plate Scores   Pre Score 52            MEDIFICTS Score Key: >=70 Need to make dietary changes  40-70 Heart Healthy Diet <= 40 Therapeutic Level Cholesterol Diet   Flowsheet Row INTENSIVE CARDIAC REHAB from 09/19/2022 in [facility]  Picture Your Plate Total Score on Admission 52      Picture Your Plate Scores: <16 Unhealthy dietary pattern with much room for improvement. 41-50 Dietary pattern unlikely to meet recommendations for good health and room for improvement. 51-60 More healthful dietary pattern, with some room for improvement.  >60 Healthy dietary pattern, although there may be some specific behaviors that could be improved.    Nutrition Goals Re-Evaluation:  Nutrition Goals Re-Evaluation     Row Name 09/06/22 0940 10/03/22 1611           Goals   Current Weight 210 lb 8.6 oz (95.5 kg) 210 lb 5.1 oz (95.4 kg)      Comment lipoproteinA WNL, A1c 6.7 A1c 7.4; other most recent labs lipoproteinA WNL      Expected Outcome Meylin reports motivation to learn more about nutrition for heart health/CHF. Patient  will benefit from participation in intensive cardiac rehab for nutrition, exercise, and lifestyle modification. Goals in progress. Riata continues to attend the [employer] and nutrition series regularly. Theadora reports motivation to learn more about nutrition for heart health/CHF. She reports better understanding of sodium, fiber, and saturated fat recommendations. She has maintained her weight since starting with our program. Patient will benefit from participation in intensive cardiac rehab for nutrition, exercise, and lifestyle modification.               Nutrition Goals Re-Evaluation:  Nutrition Goals Re-Evaluation     Row Name 09/06/22 0940 10/03/22 1611           Goals   Current Weight 210 lb 8.6 oz (95.5 kg) 210 lb 5.1 oz (95.4 kg)       Comment lipoproteinA WNL, A1c 6.7 A1c 7.4; other most recent labs lipoproteinA WNL      Expected Outcome Beadie reports motivation to learn more about nutrition for heart health/CHF. Patient will benefit from participation in intensive cardiac rehab for nutrition, exercise, and lifestyle modification. Goals in progress. Shaquasia continues to attend the [employer] and nutrition series regularly. Rennae reports motivation to learn more about nutrition for heart health/CHF. She reports better understanding of sodium, fiber, and saturated fat recommendations. She has maintained her weight since starting with our program. Patient will benefit from participation in intensive cardiac rehab for nutrition, exercise, and lifestyle modification.               Nutrition Goals Discharge (Final Nutrition Goals Re-Evaluation):  Nutrition Goals Re-Evaluation - 10/03/22 1611       Goals   Current Weight 210 lb 5.1 oz (95.4 kg)    Comment A1c 7.4; other most recent labs lipoproteinA WNL    Expected Outcome Goals in progress. Jonte continues to attend the [employer] and nutrition series regularly. Leena reports motivation to learn more about nutrition for heart health/CHF. She reports better understanding of sodium, fiber, and saturated fat recommendations. She has maintained her weight since starting with our program. Patient will benefit from participation in intensive cardiac rehab for nutrition, exercise, and lifestyle modification.             Psychosocial: Target Goals: Acknowledge presence or absence of significant depression and/or stress, maximize coping skills, provide positive support system. Participant is able to verbalize types and ability to use techniques and skills needed for reducing stress and depression.  Initial Review & Psychosocial Screening:  Initial Psych Review & Screening - 08/28/22 1421       Initial Review   Current issues with Current  Psychotropic Meds;Current Stress Concerns;Current Depression;History of Depression    Source of Stress Concerns Chronic Illness;Occupation;Unable to participate in former interests or hobbies;Unable to perform yard/household activities    Comments Bricia has PTSD from the murder of her son when he was 73 years old. Sapphyre is taking an antidepressant for PTSD. Shaghayegh says she is not interested  in counseling at this time.      Family Dynamics   Good Support System? Yes   Shadonna lives with her daughter and grandchldren. Tanaysha also has a son who she has for support     Barriers   Psychosocial barriers to participate in program The patient should benefit from training in stress management and relaxation.      Screening Interventions   Interventions Encouraged to exercise    Expected Outcomes Long Term Goal: Stressors or current issues are controlled or eliminated.;Short Term  goal: Identification and review with participant of any Quality of Life or Depression concerns found by scoring the questionnaire.;Long Term goal: The participant improves quality of Life and PHQ9 Scores as seen by post scores and/or verbalization of changes             Quality of Life Scores:  Quality of Life - 08/28/22 1450       Quality of Life   Select Quality of Life      Quality of Life Scores   Health/Function Pre 25.29 %    Socioeconomic Pre 22.5 %    Psych/Spiritual Pre 24.86 %    Family Pre 28.5 %    GLOBAL Pre 25.06 %            Scores of 19 and below usually indicate a poorer quality of life in these areas.  A difference of  2-3 points is a clinically meaningful difference.  A difference of 2-3 points in the total score of the Quality of Life Index has been associated with significant improvement in overall quality of life, self-image, physical symptoms, and general health in studies assessing change in quality of life.  PHQ-9: Review Flowsheet       09/07/2022 08/29/2022  Depression  screen PHQ 2/9  Decreased Interest 1 1  Down, Depressed, Hopeless 3 1  PHQ - 2 Score 4 2  Altered sleeping 3 2  Tired, decreased energy 1 1  Change in appetite 3 1  Feeling bad or failure about yourself  0 0  Trouble concentrating 0 0  Moving slowly or fidgety/restless 0 0  Suicidal thoughts 0 0  PHQ-9 Score 11 6  Difficult doing work/chores Somewhat difficult Not difficult at all    Details           Interpretation of Total Score  Total Score Depression Severity:  1-4 = Minimal depression, 5-9 = Mild depression, 10-14 = Moderate depression, 15-19 = Moderately severe depression, 20-27 = Severe depression   Psychosocial Evaluation and Intervention:   Psychosocial Re-Evaluation:  Psychosocial Re-Evaluation     Row Name 10/02/22 1400 10/31/22 1410           Psychosocial Re-Evaluation   Current issues with Current Stress Concerns;Current Psychotropic Meds;Current Depression;History of Depression Current Stress Concerns;Current Psychotropic Meds;Current Depression;History of Depression      Comments Nino deines having any  increased depression or stressors during exercise at cardiac rehab. Shyli continues to deny the need for counselling at this time. Fatma deines having any  increased depression or stressors during exercise at cardiac rehab.      Expected Outcomes Marageret will have controlled or decreased depression/ stressors upon completion of cardiac rehab Marageret will have controlled or decreased depression/ stressors upon completion of cardiac rehab      Interventions Encouraged to attend Cardiac Rehabilitation for the exercise;Stress management education;Relaxation education Encouraged to attend Cardiac Rehabilitation for the exercise;Stress management education;Relaxation education      Continue Psychosocial Services  Follow up required by staff Follow up required by staff        Initial Review   Source of Stress Concerns Chronic Illness;Family;Unable to  participate in former interests or hobbies;Unable to perform yard/household activities;Financial Chronic Illness;Family;Unable to participate in former interests or hobbies;Unable to perform yard/household activities;Financial      Comments Will continue to monitor and offer support as needed Will continue to monitor and offer support as needed  Psychosocial Discharge (Final Psychosocial Re-Evaluation):  Psychosocial Re-Evaluation - 10/31/22 1410       Psychosocial Re-Evaluation   Current issues with Current Stress Concerns;Current Psychotropic Meds;Current Depression;History of Depression    Comments Elvin deines having any  increased depression or stressors during exercise at cardiac rehab.    Expected Outcomes Marageret will have controlled or decreased depression/ stressors upon completion of cardiac rehab    Interventions Encouraged to attend Cardiac Rehabilitation for the exercise;Stress management education;Relaxation education    Continue Psychosocial Services  Follow up required by staff      Initial Review   Source of Stress Concerns Chronic Illness;Family;Unable to participate in former interests or hobbies;Unable to perform yard/household activities;Financial    Comments Will continue to monitor and offer support as needed             Vocational Rehabilitation: Provide vocational rehab assistance to qualifying candidates.   Vocational Rehab Evaluation & Intervention:  Vocational Rehab - 08/28/22 1451       Initial Vocational Rehab Evaluation & Intervention   Assessment shows need for Vocational Rehabilitation No   Patient  says she may be interested in vocational rehab. Given packet to review.            Education: Education Goals: Education classes will be provided on a weekly basis, covering required topics. Participant will state understanding/return demonstration of topics presented.    Education     Row Name 09/05/22 1600      Education   Cardiac Education Topics Pritikin   Customer service manager   Weekly Topic International Cuisine- Spotlight on the [employer] Zones   Instruction Review Code 1- Verbalizes Understanding   Class Start Time 1400   Class Stop Time 1448   Class Time Calculation (min) 48 min    Row Name 09/14/22 1500     Education   Cardiac Education Topics Pritikin   Nurse, children's Exercise Physiologist   Select Psychosocial   Psychosocial How Our Thoughts Can Heal Our Hearts   Instruction Review Code 1- Verbalizes Understanding   Class Start Time 1400   Class Stop Time 1450   Class Time Calculation (min) 50 min    Row Name 09/17/22 1500     Education   Cardiac Education Topics Pritikin   Select Workshops     Workshops   Educator Exercise Physiologist   Select Exercise   Exercise Workshop Managing Heart Disease: Your Path to a Healthier Heart   Instruction Review Code 1- Verbalizes Understanding   Class Start Time 1415   Class Stop Time 1510   Class Time Calculation (min) 55 min    Row Name 09/19/22 1500     Education   Cardiac Education Topics Pritikin   Customer service manager   Weekly Topic Powerhouse Plant-Based Proteins   Instruction Review Code 1- Verbalizes Understanding   Class Start Time 1400   Class Stop Time 1440   Class Time Calculation (min) 40 min    Row Name 09/24/22 1500     Education   Cardiac Education Topics --   Select --     Workshops   Educator --   Select --   Psychosocial Workshop --   Instruction Review Code --   Class Start Time --   Class Stop Time --   Class Time  Calculation (min) --    Row Name 09/26/22 1500     Education   Cardiac Education Topics Pritikin   Customer service manager   Weekly Topic Adding Flavor - Sodium-Free   Instruction Review Code 1- Verbalizes Understanding    Class Start Time 1400   Class Stop Time 1437   Class Time Calculation (min) 37 min    Row Name 10/01/22 1500     Education   Cardiac Education Topics Pritikin   Licensed conveyancer Nutrition   Nutrition Overview of the Pritikin Eating Plan   Instruction Review Code 1- Verbalizes Understanding   Class Start Time 1400   Class Stop Time 1452   Class Time Calculation (min) 52 min    Row Name 10/03/22 1500     Education   Cardiac Education Topics Pritikin   Customer service manager   Weekly Topic Fast and Healthy Breakfasts   Instruction Review Code 1- Verbalizes Understanding   Class Start Time 1400   Class Stop Time 1445   Class Time Calculation (min) 45 min    Row Name 10/10/22 1500     Education   Cardiac Education Topics Pritikin   Customer service manager   Weekly Topic Personalizing Your Pritikin Plate   Instruction Review Code 1- Verbalizes Understanding   Class Start Time 1400   Class Stop Time 1440   Class Time Calculation (min) 40 min    Row Name 10/17/22 1600     Education   Cardiac Education Topics Pritikin   Customer service manager   Weekly Topic [employer] Desserts   Instruction Review Code 1- Verbalizes Understanding   Class Start Time 1400   Class Stop Time 1442   Class Time Calculation (min) 42 min    Row Name 10/19/22 1500     Education   Cardiac Education Topics Pritikin   Licensed conveyancer Nutrition   Nutrition Other  label reading   Instruction Review Code 1- Verbalizes Understanding   Class Start Time 1400   Class Stop Time 1500   Class Time Calculation (min) 60 min    Row Name 10/22/22 1500     Education   Cardiac Education Topics Pritikin   Geographical information systems officer  Psychosocial   Psychosocial Workshop Recognizing and Reducing Stress   Instruction Review Code 1- Verbalizes Understanding   Class Start Time 1400   Class Stop Time 1447   Class Time Calculation (min) 47 min    Row Name 10/24/22 1500     Education   Cardiac Education Topics Pritikin   Orthoptist   Educator Dietitian   Weekly Topic Tasty Appetizers and Snacks   Instruction Review Code 1- Verbalizes Understanding   Class Start Time 1355   Class Stop Time 1433   Class Time Calculation (min) 38 min    Row Name 10/26/22 1400     Education   Cardiac Education Topics Pritikin   Nurse, children's Exercise Physiologist  Select Nutrition   Nutrition Calorie Density   Instruction Review Code 1- Verbalizes Understanding   Class Start Time 1358   Class Stop Time 1432   Class Time Calculation (min) 34 min    Row Name 10/31/22 1600     Education   Cardiac Education Topics Pritikin   Customer service manager   Weekly Topic Efficiency Cooking - Meals in a Snap   Instruction Review Code 1- Verbalizes Understanding   Class Start Time 1355   Class Stop Time 1430   Class Time Calculation (min) 35 min            Core Videos: Exercise    Move It!  Clinical staff conducted group or individual video education with verbal and written material and guidebook.  Patient learns the recommended Pritikin exercise program. Exercise with the goal of living a long, healthy life. Some of the health benefits of exercise include controlled diabetes, healthier blood pressure levels, improved cholesterol levels, improved heart and lung capacity, improved sleep, and better body composition. Everyone should speak with their doctor before starting or changing an exercise routine.  Biomechanical Limitations Clinical staff conducted group or individual video education with verbal and written material and  guidebook.  Patient learns how biomechanical limitations can impact exercise and how we can mitigate and possibly overcome limitations to have an impactful and balanced exercise routine.  Body Composition Clinical staff conducted group or individual video education with verbal and written material and guidebook.  Patient learns that body composition (ratio of muscle mass to fat mass) is a key component to assessing overall fitness, rather than body weight alone. Increased fat mass, especially visceral belly fat, can put [place] at increased risk for metabolic syndrome, type 2 diabetes, heart disease, and even death. It is recommended to combine diet and exercise (cardiovascular and resistance training) to improve your body composition. Seek guidance from your physician and exercise physiologist before implementing an exercise routine.  Exercise Action Plan Clinical staff conducted group or individual video education with verbal and written material and guidebook.  Patient learns the recommended strategies to achieve and enjoy long-term exercise adherence, including variety, self-motivation, self-efficacy, and positive decision making. Benefits of exercise include fitness, good health, weight management, more energy, better sleep, less stress, and overall well-being.  Medical   Heart Disease Risk Reduction Clinical staff conducted group or individual video education with verbal and written material and guidebook.  Patient learns our heart is our most vital organ as it circulates oxygen, nutrients, white blood cells, and hormones throughout the entire body, and carries waste away. Data supports a plant-based eating plan like the Pritikin Program for its effectiveness in slowing progression of and reversing heart disease. The video provides a number of recommendations to address heart disease.   Metabolic Syndrome and Belly Fat  Clinical staff conducted group or individual video education with verbal and  written material and guidebook.  Patient learns what metabolic syndrome is, how it leads to heart disease, and how one can reverse it and keep it from coming back. You have metabolic syndrome if you have 3 of the following 5 criteria: abdominal obesity, high blood pressure, high triglycerides, low HDL cholesterol, and high blood sugar.  Hypertension and Heart Disease Clinical staff conducted group or individual video education with verbal and written material and guidebook.  Patient learns that high blood pressure, or hypertension, is very common in the [place]. Hypertension is largely  due to excessive salt intake, but other important risk factors include being overweight, physical inactivity, drinking too much alcohol, smoking, and not eating enough potassium from fruits and vegetables. High blood pressure is a leading risk factor for heart attack, stroke, congestive heart failure, dementia, kidney failure, and premature death. Long-term effects of excessive salt intake include stiffening of the arteries and thickening of heart muscle and organ damage. Recommendations include ways to reduce hypertension and the risk of heart disease.  Diseases of Our Time - Focusing on Diabetes Clinical staff conducted group or individual video education with verbal and written material and guidebook.  Patient learns why the best way to stop diseases of our time is prevention, through food and other lifestyle changes. Medicine (such as prescription pills and surgeries) is often only a Band-Aid on the problem, not a long-term solution. Most common diseases of our time include obesity, type 2 diabetes, hypertension, heart disease, and cancer. The Pritikin Program is recommended and has been proven to help reduce, reverse, and/or prevent the damaging effects of metabolic syndrome.  Nutrition   Overview of the Pritikin Eating Plan  Clinical staff conducted group or individual video education with verbal and written  material and guidebook.  Patient learns about the Pritikin Eating Plan for disease risk reduction. The Pritikin Eating Plan emphasizes a wide variety of unrefined, minimally-processed carbohydrates, like fruits, vegetables, whole grains, and legumes. Go, Caution, and Stop food choices are explained. Plant-based and lean animal proteins are emphasized. Rationale provided for low sodium intake for blood pressure control, low added sugars for blood sugar stabilization, and low added fats and oils for coronary artery disease risk reduction and weight management.  Calorie Density  Clinical staff conducted group or individual video education with verbal and written material and guidebook.  Patient learns about calorie density and how it impacts the Pritikin Eating Plan. Knowing the characteristics of the food you choose will help you decide whether those foods will lead to weight gain or weight loss, and whether you want to consume more or less of them. Weight loss is usually a side effect of the Pritikin Eating Plan because of its focus on low calorie-dense foods.  Label Reading  Clinical staff conducted group or individual video education with verbal and written material and guidebook.  Patient learns about the Pritikin recommended label reading guidelines and corresponding recommendations regarding calorie density, added sugars, sodium content, and whole grains.  Dining Out - Part 1  Clinical staff conducted group or individual video education with verbal and written material and guidebook.  Patient learns that restaurant meals can be sabotaging because they can be so high in calories, fat, sodium, and/or sugar. Patient learns recommended strategies on how to positively address this and avoid unhealthy pitfalls.  Facts on Fats  Clinical staff conducted group or individual video education with verbal and written material and guidebook.  Patient learns that lifestyle modifications can be just as  effective, if not more so, as many medications for lowering your risk of heart disease. A Pritikin lifestyle can help to reduce your risk of inflammation and atherosclerosis (cholesterol build-up, or plaque, in the artery walls). Lifestyle interventions such as dietary choices and physical activity address the cause of atherosclerosis. A review of the types of fats and their impact on blood cholesterol levels, along with dietary recommendations to reduce fat intake is also included.  Nutrition Action Plan  Clinical staff conducted group or individual video education with verbal and written material and guidebook.  Patient learns how to incorporate Pritikin recommendations into their lifestyle. Recommendations include planning and keeping personal health goals in mind as an important part of their success.  Healthy Mind-Set    Healthy Minds, Bodies, Hearts  Clinical staff conducted group or individual video education with verbal and written material and guidebook.  Patient learns how to identify when they are stressed. Video will discuss the impact of that stress, as well as the many benefits of stress management. Patient will also be introduced to stress management techniques. The way we think, act, and feel has an impact on our hearts.  How Our Thoughts Can Heal Our Hearts  Clinical staff conducted group or individual video education with verbal and written material and guidebook.  Patient learns that negative thoughts can cause depression and anxiety. This can result in negative lifestyle behavior and serious health problems. Cognitive behavioral therapy is an effective method to help control our thoughts in order to change and improve our emotional outlook.  Additional Videos:  Exercise    Improving Performance  Clinical staff conducted group or individual video education with verbal and written material and guidebook.  Patient learns to use a non-linear approach by alternating intensity  levels and lengths of time spent exercising to help burn more calories and lose more body fat. Cardiovascular exercise helps improve heart health, metabolism, hormonal balance, blood sugar control, and recovery from fatigue. Resistance training improves strength, endurance, balance, coordination, reaction time, metabolism, and muscle mass. Flexibility exercise improves circulation, posture, and balance. Seek guidance from your physician and exercise physiologist before implementing an exercise routine and learn your capabilities and proper form for all exercise.  Introduction to Yoga  Clinical staff conducted group or individual video education with verbal and written material and guidebook.  Patient learns about yoga, a discipline of the coming together of mind, breath, and body. The benefits of yoga include improved flexibility, improved range of motion, better posture and core strength, increased lung function, weight loss, and positive self-image. Yoga's heart health benefits include lowered blood pressure, healthier heart rate, decreased cholesterol and triglyceride levels, improved immune function, and reduced stress. Seek guidance from your physician and exercise physiologist before implementing an exercise routine and learn your capabilities and proper form for all exercise.  Medical   Aging: Enhancing Your Quality of Life  Clinical staff conducted group or individual video education with verbal and written material and guidebook.  Patient learns key strategies and recommendations to stay in good physical health and enhance quality of life, such as prevention strategies, having an advocate, securing a Health Care Proxy and Power of Attorney, and keeping a list of medications and system for tracking them. It also discusses how to avoid risk for bone loss.  Biology of Weight Control  Clinical staff conducted group or individual video education with verbal and written material and guidebook.   Patient learns that weight gain occurs because we consume more calories than we burn (eating more, moving less). Even if your body weight is normal, you may have higher ratios of fat compared to muscle mass. Too much body fat puts you at increased risk for cardiovascular disease, heart attack, stroke, type 2 diabetes, and obesity-related cancers. In addition to exercise, following the Pritikin Eating Plan can help reduce your risk.  Decoding Lab Results  Clinical staff conducted group or individual video education with verbal and written material and guidebook.  Patient learns that lab test reflects one measurement whose values change over time and are influenced  by many factors, including medication, stress, sleep, exercise, food, hydration, pre-existing medical conditions, and more. It is recommended to use the knowledge from this video to become more involved with your lab results and evaluate your numbers to speak with your doctor.   Diseases of Our Time - Overview  Clinical staff conducted group or individual video education with verbal and written material and guidebook.  Patient learns that according to the CDC, 50% to 70% of chronic diseases (such as obesity, type 2 diabetes, elevated lipids, hypertension, and heart disease) are avoidable through lifestyle improvements including healthier food choices, listening to satiety cues, and increased physical activity.  Sleep Disorders Clinical staff conducted group or individual video education with verbal and written material and guidebook.  Patient learns how good quality and duration of sleep are important to overall health and well-being. Patient also learns about sleep disorders and how they impact health along with recommendations to address them, including discussing with a physician.  Nutrition  Dining Out - Part 2 Clinical staff conducted group or individual video education with verbal and written material and guidebook.  Patient learns  how to plan ahead and communicate in order to maximize their dining experience in a healthy and nutritious manner. Included are recommended food choices based on the type of restaurant the patient is visiting.   Fueling a Banker conducted group or individual video education with verbal and written material and guidebook.  There is a strong connection between our food choices and our health. Diseases like obesity and type 2 diabetes are very prevalent and are in large-part due to lifestyle choices. The Pritikin Eating Plan provides plenty of food and hunger-curbing satisfaction. It is easy to follow, affordable, and helps reduce health risks.  Menu Workshop  Clinical staff conducted group or individual video education with verbal and written material and guidebook.  Patient learns that restaurant meals can sabotage health goals because they are often packed with calories, fat, sodium, and sugar. Recommendations include strategies to plan ahead and to communicate with the manager, chef, or server to help order a healthier meal.  Planning Your Eating Strategy  Clinical staff conducted group or individual video education with verbal and written material and guidebook.  Patient learns about the Pritikin Eating Plan and its benefit of reducing the risk of disease. The Pritikin Eating Plan does not focus on calories. Instead, it emphasizes high-quality, nutrient-rich foods. By knowing the characteristics of the foods, we choose, we can determine their calorie density and make informed decisions.  Targeting Your Nutrition Priorities  Clinical staff conducted group or individual video education with verbal and written material and guidebook.  Patient learns that lifestyle habits have a tremendous impact on disease risk and progression. This video provides eating and physical activity recommendations based on your personal health goals, such as reducing LDL cholesterol, losing weight,  preventing or controlling type 2 diabetes, and reducing high blood pressure.  Vitamins and Minerals  Clinical staff conducted group or individual video education with verbal and written material and guidebook.  Patient learns different ways to obtain key vitamins and minerals, including through a recommended healthy diet. It is important to discuss all supplements you take with your doctor.   Healthy Mind-Set    Smoking Cessation  Clinical staff conducted group or individual video education with verbal and written material and guidebook.  Patient learns that cigarette smoking and tobacco addiction pose a serious health risk which affects millions of people. Stopping smoking will  significantly reduce the risk of heart disease, lung disease, and many forms of cancer. Recommended strategies for quitting are covered, including working with your doctor to develop a successful plan.  Culinary   Becoming a Set designer conducted group or individual video education with verbal and written material and guidebook.  Patient learns that cooking at home can be healthy, cost-effective, quick, and puts them in control. Keys to cooking healthy recipes will include looking at your recipe, assessing your equipment needs, planning ahead, making it simple, choosing cost-effective seasonal ingredients, and limiting the use of added fats, salts, and sugars.  Cooking - Breakfast and Snacks  Clinical staff conducted group or individual video education with verbal and written material and guidebook.  Patient learns how important breakfast is to satiety and nutrition through the entire day. Recommendations include key foods to eat during breakfast to help stabilize blood sugar levels and to prevent overeating at meals later in the day. Planning ahead is also a key component.  Cooking - Educational psychologist conducted group or individual video education with verbal and written material and  guidebook.  Patient learns eating strategies to improve overall health, including an approach to cook more at home. Recommendations include thinking of animal protein as a side on your plate rather than center stage and focusing instead on lower calorie dense options like vegetables, fruits, whole grains, and plant-based proteins, such as beans. Making sauces in large quantities to freeze for later and leaving the skin on your vegetables are also recommended to maximize your experience.  Cooking - Healthy Salads and Dressing Clinical staff conducted group or individual video education with verbal and written material and guidebook.  Patient learns that vegetables, fruits, whole grains, and legumes are the foundations of the Pritikin Eating Plan. Recommendations include how to incorporate each of these in flavorful and healthy salads, and how to create homemade salad dressings. Proper handling of ingredients is also covered. Cooking - Soups and [employer] - Soups and Desserts Clinical staff conducted group or individual video education with verbal and written material and guidebook.  Patient learns that Pritikin soups and desserts make for easy, nutritious, and delicious snacks and meal components that are low in sodium, fat, sugar, and calorie density, while high in vitamins, minerals, and filling fiber. Recommendations include simple and healthy ideas for soups and desserts.   Overview     The Pritikin Solution Program Overview Clinical staff conducted group or individual video education with verbal and written material and guidebook.  Patient learns that the results of the Pritikin Program have been documented in more than 100 articles published in peer-reviewed journals, and the benefits include reducing risk factors for (and, in some cases, even reversing) high cholesterol, high blood pressure, type 2 diabetes, obesity, and more! An overview of the three key pillars of the Pritikin Program  will be covered: eating well, doing regular exercise, and having a healthy mind-set.  WORKSHOPS  Exercise: Exercise Basics: Building Your Action Plan Clinical staff led group instruction and group discussion with PowerPoint presentation and patient guidebook. To enhance the learning environment the use of posters, models and videos may be added. At the conclusion of this workshop, patients will comprehend the difference between physical activity and exercise, as well as the benefits of incorporating both, into their routine. Patients will understand the FITT (Frequency, Intensity, Time, and Type) principle and how to use it to build an exercise action plan. In addition, safety  concerns and other considerations for exercise and cardiac rehab will be addressed by the presenter. The purpose of this lesson is to promote a comprehensive and effective weekly exercise routine in order to improve patients' overall level of fitness.   Managing Heart Disease: Your Path to a Healthier Heart Clinical staff led group instruction and group discussion with PowerPoint presentation and patient guidebook. To enhance the learning environment the use of posters, models and videos may be added.At the conclusion of this workshop, patients will understand the anatomy and physiology of the heart. Additionally, they will understand how Pritikin's three pillars impact the risk factors, the progression, and the management of heart disease.  The purpose of this lesson is to provide a high-level overview of the heart, heart disease, and how the Pritikin lifestyle positively impacts risk factors.  Exercise Biomechanics Clinical staff led group instruction and group discussion with PowerPoint presentation and patient guidebook. To enhance the learning environment the use of posters, models and videos may be added. Patients will learn how the structural parts of their bodies function and how these functions impact their daily  activities, movement, and exercise. Patients will learn how to promote a neutral spine, learn how to manage pain, and identify ways to improve their physical movement in order to promote healthy living. The purpose of this lesson is to expose patients to common physical limitations that impact physical activity. Participants will learn practical ways to adapt and manage aches and pains, and to minimize their effect on regular exercise. Patients will learn how to maintain good posture while sitting, walking, and lifting.  Balance Training and Fall Prevention  Clinical staff led group instruction and group discussion with PowerPoint presentation and patient guidebook. To enhance the learning environment the use of posters, models and videos may be added. At the conclusion of this workshop, patients will understand the importance of their sensorimotor skills (vision, proprioception, and the vestibular system) in maintaining their ability to balance as they age. Patients will apply a variety of balancing exercises that are appropriate for their current level of function. Patients will understand the common causes for poor balance, possible solutions to these problems, and ways to modify their physical environment in order to minimize their fall risk. The purpose of this lesson is to teach patients about the importance of maintaining balance as they age and ways to minimize their risk of falling.  WORKSHOPS   Nutrition:  Fueling a Ship broker led group instruction and group discussion with PowerPoint presentation and patient guidebook. To enhance the learning environment the use of posters, models and videos may be added. Patients will review the foundational principles of the Pritikin Eating Plan and understand what constitutes a serving size in each of the food groups. Patients will also learn Pritikin-friendly foods that are better choices when away from home and review make-ahead  meal and snack options. Calorie density will be reviewed and applied to three nutrition priorities: weight maintenance, weight loss, and weight gain. The purpose of this lesson is to reinforce (in a group setting) the key concepts around what patients are recommended to eat and how to apply these guidelines when away from home by planning and selecting Pritikin-friendly options. Patients will understand how calorie density may be adjusted for different weight management goals.  Mindful Eating  Clinical staff led group instruction and group discussion with PowerPoint presentation and patient guidebook. To enhance the learning environment the use of posters, models and videos may be added. Patients will  briefly review the concepts of the Pritikin Eating Plan and the importance of low-calorie dense foods. The concept of mindful eating will be introduced as well as the importance of paying attention to internal hunger signals. Triggers for non-hunger eating and techniques for dealing with triggers will be explored. The purpose of this lesson is to provide patients with the opportunity to review the basic principles of the Pritikin Eating Plan, discuss the value of eating mindfully and how to measure internal cues of hunger and fullness using the Hunger Scale. Patients will also discuss reasons for non-hunger eating and learn strategies to use for controlling emotional eating.  Targeting Your Nutrition Priorities Clinical staff led group instruction and group discussion with PowerPoint presentation and patient guidebook. To enhance the learning environment the use of posters, models and videos may be added. Patients will learn how to determine their genetic susceptibility to disease by reviewing their family history. Patients will gain insight into the importance of diet as part of an overall healthy lifestyle in mitigating the impact of genetics and other environmental insults. The purpose of this lesson is to  provide patients with the opportunity to assess their personal nutrition priorities by looking at their family history, their own health history and current risk factors. Patients will also be able to discuss ways of prioritizing and modifying the Pritikin Eating Plan for their highest risk areas  Menu  Clinical staff led group instruction and group discussion with PowerPoint presentation and patient guidebook. To enhance the learning environment the use of posters, models and videos may be added. Using menus brought in from [employer], or printed from [employer], patients will apply the Pritikin dining out guidelines that were presented in the [employer] video. Patients will also be able to practice these guidelines in a variety of provided scenarios. The purpose of this lesson is to provide patients with the opportunity to practice hands-on learning of the Pritikin Dining Out guidelines with actual menus and practice scenarios.  Label Reading Clinical staff led group instruction and group discussion with PowerPoint presentation and patient guidebook. To enhance the learning environment the use of posters, models and videos may be added. Patients will review and discuss the Pritikin label reading guidelines presented in Pritikin's Label Reading Educational series video. Using fool labels brought in from local grocery stores and markets, patients will apply the label reading guidelines and determine if the packaged food meet the Pritikin guidelines. The purpose of this lesson is to provide patients with the opportunity to review, discuss, and practice hands-on learning of the Pritikin Label Reading guidelines with actual packaged food labels. Cooking School  Pritikin's [employer] are designed to teach patients ways to prepare quick, simple, and affordable recipes at home. The importance of nutrition's role in chronic disease risk reduction is reflected in its  emphasis in the overall Pritikin program. By learning how to prepare essential core Pritikin Eating Plan recipes, patients will increase control over what they eat; be able to customize the flavor of foods without the use of added salt, sugar, or fat; and improve the quality of the food they consume. By learning a set of core recipes which are easily assembled, quickly prepared, and affordable, patients are more likely to prepare more healthy foods at home. These workshops focus on convenient breakfasts, simple entres, side dishes, and desserts which can be prepared with minimal effort and are consistent with nutrition recommendations for cardiovascular risk reduction. Cooking [employer] are taught by  a Armed forces logistics/support/administrative officer (RD) who has been trained by the [employer] team. The chef or RD has a clear understanding of the importance of minimizing - if not completely eliminating - added fat, sugar, and sodium in recipes. Throughout the series of Cooking School Workshop sessions, patients will learn about healthy ingredients and efficient methods of cooking to build confidence in their capability to prepare    Cooking School weekly topics:  Adding Flavor- Sodium-Free  Fast and Healthy Breakfasts  Powerhouse Plant-Based Proteins  Satisfying Salads and Dressings  Simple Sides and Sauces  International Cuisine-Spotlight on the [employer] Zones  Delicious Desserts  Savory Soups  [employer] - Meals in a Astronomer Appetizers and Snacks  Comforting Weekend Breakfasts  One-Pot Wonders   Fast Evening Meals  Landscape architect Your Pritikin Plate  WORKSHOPS   Healthy Mindset (Psychosocial):  Focused Goals, Sustainable Changes Clinical staff led group instruction and group discussion with PowerPoint presentation and patient guidebook. To enhance the learning environment the use of posters, models and videos may be added. Patients will be able to apply effective  goal setting strategies to establish at least one personal goal, and then take consistent, meaningful action toward that goal. They will learn to identify common barriers to achieving personal goals and develop strategies to overcome them. Patients will also gain an understanding of how our mind-set can impact our ability to achieve goals and the importance of cultivating a positive and growth-oriented mind-set. The purpose of this lesson is to provide patients with a deeper understanding of how to set and achieve personal goals, as well as the tools and strategies needed to overcome common obstacles which may arise along the way.  From Head to Heart: The Power of a Healthy Outlook  Clinical staff led group instruction and group discussion with PowerPoint presentation and patient guidebook. To enhance the learning environment the use of posters, models and videos may be added. Patients will be able to recognize and describe the impact of emotions and mood on physical health. They will discover the importance of self-care and explore self-care practices which may work for them. Patients will also learn how to utilize the 4 C's to cultivate a healthier outlook and better manage stress and challenges. The purpose of this lesson is to demonstrate to patients how a healthy outlook is an essential part of maintaining good health, especially as they continue their cardiac rehab journey.  Healthy Sleep for a Healthy Heart Clinical staff led group instruction and group discussion with PowerPoint presentation and patient guidebook. To enhance the learning environment the use of posters, models and videos may be added. At the conclusion of this workshop, patients will be able to demonstrate knowledge of the importance of sleep to overall health, well-being, and quality of life. They will understand the symptoms of, and treatments for, common sleep disorders. Patients will also be able to identify daytime and nighttime  behaviors which impact sleep, and they will be able to apply these tools to help manage sleep-related challenges. The purpose of this lesson is to provide patients with a general overview of sleep and outline the importance of quality sleep. Patients will learn about a few of the most common sleep disorders. Patients will also be introduced to the concept of "sleep hygiene," and discover ways to self-manage certain sleeping problems through simple daily behavior changes. Finally, the workshop will motivate patients by clarifying the links between quality sleep and their goals of heart-healthy  living.   Recognizing and Reducing Stress Clinical staff led group instruction and group discussion with PowerPoint presentation and patient guidebook. To enhance the learning environment the use of posters, models and videos may be added. At the conclusion of this workshop, patients will be able to understand the types of stress reactions, differentiate between acute and chronic stress, and recognize the impact that chronic stress has on their health. They will also be able to apply different coping mechanisms, such as reframing negative self-talk. Patients will have the opportunity to practice a variety of stress management techniques, such as deep abdominal breathing, progressive muscle relaxation, and/or guided imagery.  The purpose of this lesson is to educate patients on the role of stress in their lives and to provide healthy techniques for coping with it.  Learning Barriers/Preferences:  Learning Barriers/Preferences - 08/28/22 1612       Learning Barriers/Preferences   Learning Barriers Sight   wears glasses.   Learning Preferences Audio;Skilled Demonstration             Education Topics:  Knowledge Questionnaire Score:  Knowledge Questionnaire Score - 08/28/22 1438       Knowledge Questionnaire Score   Pre Score 22/24             Core Components/Risk Factors/Patient Goals at  Admission:  Personal Goals and Risk Factors at Admission - 08/28/22 1442       Core Components/Risk Factors/Patient Goals on Admission    Weight Management Yes;Obesity;Weight Loss    Intervention Weight Management/Obesity: Establish reasonable short term and long term weight goals.;Obesity: Provide education and appropriate resources to help participant work on and attain dietary goals.    Admit Weight 209 lb 10.5 oz (95.1 kg)    Expected Outcomes Short Term: Continue to assess and modify interventions until short term weight is achieved;Long Term: Adherence to nutrition and physical activity/exercise program aimed toward attainment of established weight goal;Weight Loss: Understanding of general recommendations for a balanced deficit meal plan, which promotes 1-2 lb weight loss per week and includes a negative energy balance of [phone] kcal/d    Diabetes Yes    Intervention Provide education about signs/symptoms and action to take for hypo/hyperglycemia.;Provide education about proper nutrition, including hydration, and aerobic/resistive exercise prescription along with prescribed medications to achieve blood glucose in normal ranges: Fasting glucose 65-99 mg/dL    Expected Outcomes Short Term: Participant verbalizes understanding of the signs/symptoms and immediate care of hyper/hypoglycemia, proper foot care and importance of medication, aerobic/resistive exercise and nutrition plan for blood glucose control.;Long Term: Attainment of HbA1C < 7%.    Heart Failure Yes    Intervention Provide a combined exercise and nutrition program that is supplemented with education, support and counseling about heart failure. Directed toward relieving symptoms such as shortness of breath, decreased exercise tolerance, and extremity edema.    Expected Outcomes Improve functional capacity of life;Short term: Attendance in program 2-3 days a week with increased exercise capacity. Reported lower sodium intake.  Reported increased fruit and vegetable intake. Reports medication compliance.;Short term: Daily weights obtained and reported for increase. Utilizing diuretic protocols set by physician.;Long term: Adoption of self-care skills and reduction of barriers for early signs and symptoms recognition and intervention leading to self-care maintenance.    Hypertension Yes    Intervention Provide education on lifestyle modifcations including regular physical activity/exercise, weight management, moderate sodium restriction and increased consumption of fresh fruit, vegetables, and low fat dairy, alcohol moderation, and smoking cessation.;Monitor prescription use compliance.  Expected Outcomes Short Term: Continued assessment and intervention until BP is < 140/33mm HG in hypertensive participants. < 130/35mm HG in hypertensive participants with diabetes, heart failure or chronic kidney disease.;Long Term: Maintenance of blood pressure at goal levels.    Lipids Yes    Intervention Provide education and support for participant on nutrition & aerobic/resistive exercise along with prescribed medications to achieve LDL 70mg , HDL >40mg .    Expected Outcomes Short Term: Participant states understanding of desired cholesterol values and is compliant with medications prescribed. Participant is following exercise prescription and nutrition guidelines.;Long Term: Cholesterol controlled with medications as prescribed, with individualized exercise RX and with personalized nutrition plan. Value goals: LDL < 70mg , HDL > 40 mg.    Stress Yes    Intervention Offer individual and/or small group education and counseling on adjustment to heart disease, stress management and health-related lifestyle change. Teach and support self-help strategies.    Expected Outcomes Short Term: Participant demonstrates changes in health-related behavior, relaxation and other stress management skills, ability to obtain effective social support, and  compliance with psychotropic medications if prescribed.;Long Term: Emotional wellbeing is indicated by absence of clinically significant psychosocial distress or social isolation.             Core Components/Risk Factors/Patient Goals Review:   Goals and Risk Factor Review     Row Name 09/05/22 1706 10/02/22 1405 10/31/22 1412         Core Components/Risk Factors/Patient Goals Review   Personal Goals Review Weight Management/Obesity;Heart Failure;Stress;Hypertension;Lipids Weight Management/Obesity;Heart Failure;Stress;Hypertension;Lipids Weight Management/Obesity;Heart Failure;Stress;Hypertension;Lipids     Review Jory started cardiac rehab on 09/05/22 and did well with exercise. Vital signs and CBG's were stable Kortnee is doing well with exercise at cardiac rehab . Vital signs and CBG's have been stable. Jany has lost 1.3 kg since starting the program. Riha is doing well with exercise at cardiac rehab . Vital signs and CBG's have been stable. Janyne has lost 2.1 kg since starting the program.     Expected Outcomes Milessa will continue to participate in cardiac rehab for exercise, nutrtion and lifestyle modifcations. Karensa will continue to participate in cardiac rehab for exercise, nutrtion and lifestyle modifcations. Trionna will continue to participate in cardiac rehab for exercise, nutrtion and lifestyle modifcations.              Core Components/Risk Factors/Patient Goals at Discharge (Final Review):   Goals and Risk Factor Review - 10/31/22 1412       Core Components/Risk Factors/Patient Goals Review   Personal Goals Review Weight Management/Obesity;Heart Failure;Stress;Hypertension;Lipids    Review Venia is doing well with exercise at cardiac rehab . Vital signs and CBG's have been stable. Madysun has lost 2.1 kg since starting the program.    Expected Outcomes Dannae will continue to participate in cardiac rehab for exercise, nutrtion and lifestyle  modifcations.             ITP Comments:  ITP Comments     Row Name 08/28/22 1311 09/05/22 1705 10/02/22 1358 10/31/22 1409     ITP Comments Medical Director- Dr. Armanda Magic, MD. Introduction to the Pritikin Education Program / Intensive Cardiac Rehab. Reviewed initial orientation folder with patient. 30 Day ITP Review. Marquasia started cardiac rehab on 09/05/22 and did well with exercise 30 Day ITP Review. Briellah has good attendance and participation in cardiac rehab 30 Day ITP Review. Deshanda has good attendance and participation in cardiac rehab.  Comments: See ITP comments.Thayer Headings RN BSN

## 2022-10-31 NOTE — Progress Notes (Deleted)
Cardiac Individual Treatment Plan  Patient Details  Name: Caelia Goeser MRN: [id number] Date of Birth: 05/14/1950 Referring Provider:   Flowsheet Row INTENSIVE CARDIAC REHAB ORIENT from 08/28/2022 in [facility]  Referring Provider Dorthula Nettles, DO       Initial Encounter Date:  Flowsheet Row INTENSIVE CARDIAC REHAB ORIENT from 08/28/2022 in [facility]  Date 08/28/22       Visit Diagnosis: Heart failure, chronic systolic (HCC)  Patient's Home Medications on Admission:  Current Outpatient Medications:    aspirin 81 MG chewable tablet, Chew 81 mg by mouth in the morning., Disp: , Rfl:    buPROPion (WELLBUTRIN XL) 300 MG 24 hr tablet, Take 300 mg by mouth every morning., Disp: , Rfl:    cetirizine (ZYRTEC) 10 MG tablet, Take 10 mg by mouth daily as needed for allergies., Disp: , Rfl:    dapagliflozin propanediol (FARXIGA) 10 MG TABS tablet, Take 1 tablet (10 mg total) by mouth daily., Disp: 90 tablet, Rfl: 3   digoxin (LANOXIN) 0.125 MG tablet, Take 1 tablet (0.125 mg total) by mouth daily., Disp: 90 tablet, Rfl: 3   diphenhydrAMINE (BENADRYL) 25 MG tablet, Take 25 mg by mouth at bedtime., Disp: , Rfl:    fluticasone (FLONASE) 50 MCG/ACT nasal spray, Place 1 spray into both nostrils daily as needed for allergies or rhinitis., Disp: , Rfl:    furosemide (LASIX) 40 MG tablet, Take 1 tablet (40 mg total) by mouth every other day., Disp: 45 tablet, Rfl: 3   gabapentin (NEURONTIN) 100 MG capsule, TAKE 1 CAPSULE(100 MG) BY MOUTH AT BEDTIME, Disp: 30 capsule, Rfl: 6   levothyroxine (SYNTHROID) 25 MCG tablet, Take 25 mcg by mouth daily before breakfast., Disp: , Rfl:    losartan (COZAAR) 100 MG tablet, Take 1 tablet (100 mg total) by mouth daily., Disp: 90 tablet, Rfl: 3   losartan (COZAAR) 50 MG tablet, Take 1 tablet (50 mg total) by mouth daily., Disp: 90 tablet, Rfl:  3   Menthol-Methyl Salicylate (MUSCLE RUB) 10-15 % CREA, Apply 1 Application topically 2 (two) times a week., Disp: , Rfl:    metFORMIN (GLUCOPHAGE) 1000 MG tablet, Take 1,000 mg by mouth daily., Disp: , Rfl:    metoprolol succinate (TOPROL-XL) 50 MG 24 hr tablet, Take 3 tablets (150 mg total) by mouth daily. Take with or immediately following a meal., Disp: 90 tablet, Rfl: 3   Potassium Chloride ER 20 MEQ TBCR, Take 1 tablet (20 mEq total) by mouth every other day., Disp: 45 tablet, Rfl: 3   pravastatin (PRAVACHOL) 20 MG tablet, Take 20 mg by mouth in the morning., Disp: , Rfl:    spironolactone (ALDACTONE) 25 MG tablet, Take 1 tablet (25 mg total) by mouth daily., Disp: 90 tablet, Rfl: 3   venlafaxine XR (EFFEXOR-XR) 75 MG 24 hr capsule, Take 75 mg by mouth in the morning, at noon, and at bedtime., Disp: , Rfl:   Past Medical History: Past Medical History:  Diagnosis Date   Coronary artery disease    Depression    Diabetes mellitus    Fibromyalgia    Goiter    Hypertension    MI (myocardial infarction) (HCC)    Osteoarthritis    Pneumonia    10+ years ago.    Tobacco Use: Social History   Tobacco Use  Smoking Status Former   Current packs/day: 0.00   Types: Cigarettes  Quit date: 07/07/2018   Years since quitting: 4.3   Passive exposure: Never  Smokeless Tobacco Never    Labs: Review Flowsheet  More data exists      Latest Ref Rng & Units 07/10/2009 05/02/2020 07/20/2021 07/21/2021 09/07/2022  Labs for ITP Cardiac and Pulmonary Rehab  Hemoglobin A1c 4.0 - 5.6 % - 6.8  6.7  - 7.4   PH, Arterial 7.35 - 7.45 - - - 7.427  -  PCO2 arterial 32 - 48 mmHg - - - 41.6  -  Bicarbonate 20.0 - 28.0 mmol/L - - - 27.4  29.3  28.5  -  TCO2 22 - 32 mmol/L 31  - - 29  31  30   -  O2 Saturation % - - - 96  56  58  -    Details       Multiple values from one day are sorted in reverse-chronological order         Capillary Blood Glucose: Lab Results  Component Value Date   GLUCAP 122  (H) 10/10/2022   GLUCAP 148 (H) 09/26/2022   GLUCAP 151 (H) 09/24/2022   GLUCAP 142 (H) 09/19/2022   GLUCAP 115 (H) 09/17/2022     Exercise Target Goals: Exercise Program Goal: Individual exercise prescription set using results from initial 6 min walk test and THRR while considering  patient's activity barriers and safety.   Exercise Prescription Goal: Initial exercise prescription builds to 30-45 minutes a day of aerobic activity, 2-3 days per week.  Home exercise guidelines will be given to patient during program as part of exercise prescription that the participant will acknowledge.  Activity Barriers & Risk Stratification:  Activity Barriers & Cardiac Risk Stratification - 08/28/22 1442       Activity Barriers & Cardiac Risk Stratification   Activity Barriers Arthritis;Other (comment);Fibromyalgia    Comments Chronic knee pain, per patient needs bilateral knee replacement. Severe OA. Has been told she has fibromyalgia.    Cardiac Risk Stratification High             6 Minute Walk:  6 Minute Walk     Row Name 08/28/22 1525         6 Minute Walk   Phase Initial     Distance 1205 feet     Walk Time 6 minutes     # of Rest Breaks 1  12-second standing rest break because nose was running     MPH 2.28     METS 2.89     RPE 7     Perceived Dyspnea  1     VO2 Peak 10.11     Symptoms Yes (comment)     Comments Mild SOB, runny nose     Resting HR 83 bpm     Resting BP 122/74     Resting Oxygen Saturation  97 %     Exercise Oxygen Saturation  during 6 min walk 98 %     Max Ex. HR 137 bpm     Max Ex. BP 146/72     2 Minute Post BP 138/74              Oxygen Initial Assessment:   Oxygen Re-Evaluation:   Oxygen Discharge (Final Oxygen Re-Evaluation):   Initial Exercise Prescription:  Initial Exercise Prescription - 08/28/22 1600       Date of Initial Exercise RX and Referring Provider   Date 08/28/22    Referring Provider Dorthula Nettles, DO  Expected Discharge Date 11/21/22      Recumbant Bike   Level 1    Watts 25    Minutes 15    METs 2.2      NuStep   Level 1    SPM 75    Minutes 15    METs 2.2      Prescription Details   Frequency (times per week) 3    Duration Progress to 30 minutes of continuous aerobic without signs/symptoms of physical distress      Intensity   THRR 40-80% of Max Heartrate 59-118    Ratings of Perceived Exertion 11-13    Perceived Dyspnea 0-4      Progression   Progression Continue to progress workloads to maintain intensity without signs/symptoms of physical distress.      Resistance Training   Training Prescription Yes    Weight 2 lbs    Reps 10-15             Perform Capillary Blood Glucose checks as needed.  Exercise Prescription Changes:   Exercise Prescription Changes     Row Name 09/05/22 1633 09/26/22 1649 10/10/22 1647 10/31/22 1617       Response to Exercise   Blood Pressure (Admit) 124/62 120/80 120/70 124/60    Blood Pressure (Exercise) 150/70 132/60 120/72 128/80    Blood Pressure (Exit) 104/68 94/60 112/64 122/64    Heart Rate (Admit) 85 bpm 73 bpm 74 bpm 83 bpm    Heart Rate (Exercise) 97 bpm 92 bpm 93 bpm 113 bpm    Heart Rate (Exit) 88 bpm 79 bpm 74 bpm 90 bpm    Rating of Perceived Exertion (Exercise) 10 10 11 11     Perceived Dyspnea (Exercise) 0 0 0 0    Symptoms none none none none    Comments Pt first day in the [facility] program Reviewed MET's and goals Reviewed MET's Reviewed MET's and goals    Duration Progress to 30 minutes of  aerobic without signs/symptoms of physical distress Progress to 30 minutes of  aerobic without signs/symptoms of physical distress Progress to 30 minutes of  aerobic without signs/symptoms of physical distress Progress to 30 minutes of  aerobic without signs/symptoms of physical distress    Intensity THRR unchanged THRR unchanged THRR unchanged THRR unchanged      Progression   Progression Continue to progress  workloads to maintain intensity without signs/symptoms of physical distress. Continue to progress workloads to maintain intensity without signs/symptoms of physical distress. Continue to progress workloads to maintain intensity without signs/symptoms of physical distress. Continue to progress workloads to maintain intensity without signs/symptoms of physical distress.    Average METs 1.4 2.1 1.9 2.2      Resistance Training   Training Prescription No No No No      NuStep   Level 1 2 2 4     SPM 60 71 75 80    Minutes 15 15 31 30     METs 1.8 2.1 1.8 2.2      Recumbant Elliptical   Level 1 -- -- --    RPM 21 -- -- --    Watts 22 -- -- --    Minutes 15 -- -- --    METs 1 -- -- --             Exercise Comments:   Exercise Comments     Row Name 09/05/22 1641 09/26/22 1626 10/10/22 1651 10/31/22 1621     Exercise Comments Pt first  day in the [facility] program. Pt tolerated exercise well with an average MET level of 1.4. Pt attempted recumbent bike, but was uncomfortable for her knees, Octane worked out much better. She felt good with her new ExRx and she is off to a good start. Will continue to increase workloads as tolerated without sign or symtpom Reviewed MET's and goals. Reviewed MET's today. Pt tolerated exercise well with an average MET level of 1.9. Pt is doing well, continuing to work on gradual increase and increased SPM Reviewed MET's and goals. Pt tolerated exercise well with an average MET level of 2.2. She feels good about her goals and is gaining strength and getting back into an exercise routine.             Exercise Goals and Review:   Exercise Goals     Row Name 08/28/22 1442             Exercise Goals   Increase Physical Activity Yes       Intervention Provide advice, education, support and counseling about physical activity/exercise needs.;Develop an individualized exercise prescription for aerobic and resistive training based on initial evaluation  findings, risk stratification, comorbidities and participant's personal goals.       Expected Outcomes Short Term: Attend rehab on a regular basis to increase amount of physical activity.;Long Term: Add in home exercise to make exercise part of routine and to increase amount of physical activity.;Long Term: Exercising regularly at least 3-5 days a week.       Increase Strength and Stamina Yes       Intervention Provide advice, education, support and counseling about physical activity/exercise needs.;Develop an individualized exercise prescription for aerobic and resistive training based on initial evaluation findings, risk stratification, comorbidities and participant's personal goals.       Expected Outcomes Long Term: Improve cardiorespiratory fitness, muscular endurance and strength as measured by increased METs and functional capacity ( );Short Term: Perform resistance training exercises routinely during rehab and add in resistance training at home;Short Term: Increase workloads from initial exercise prescription for resistance, speed, and METs.       Able to understand and use rate of perceived exertion (RPE) scale Yes       Intervention Provide education and explanation on how to use RPE scale       Expected Outcomes Short Term: Able to use RPE daily in rehab to express subjective intensity level;Long Term:  Able to use RPE to guide intensity level when exercising independently       Knowledge and understanding of Target Heart Rate Range (THRR) Yes       Intervention Provide education and explanation of THRR including how the numbers were predicted and where they are located for reference       Expected Outcomes Short Term: Able to state/look up THRR;Long Term: Able to use THRR to govern intensity when exercising independently;Short Term: Able to use daily as guideline for intensity in rehab       Able to check pulse independently Yes       Intervention Provide education and demonstration on how  to check pulse in carotid and radial arteries.;Review the importance of being able to check your own pulse for safety during independent exercise       Expected Outcomes Short Term: Able to explain why pulse checking is important during independent exercise;Long Term: Able to check pulse independently and accurately       Understanding of Exercise Prescription Yes  Intervention Provide education, explanation, and written materials on patient's individual exercise prescription       Expected Outcomes Short Term: Able to explain program exercise prescription;Long Term: Able to explain home exercise prescription to exercise independently                Exercise Goals Re-Evaluation :  Exercise Goals Re-Evaluation     Row Name 09/05/22 1638 09/26/22 1621 10/31/22 1620         Exercise Goal Re-Evaluation   Exercise Goals Review Increase Physical Activity;Understanding of Exercise Prescription;Increase Strength and Stamina;Knowledge and understanding of Target Heart Rate Range (THRR);Able to understand and use rate of perceived exertion (RPE) scale Increase Physical Activity;Understanding of Exercise Prescription;Increase Strength and Stamina;Knowledge and understanding of Target Heart Rate Range (THRR);Able to understand and use rate of perceived exertion (RPE) scale Increase Physical Activity;Understanding of Exercise Prescription;Increase Strength and Stamina;Knowledge and understanding of Target Heart Rate Range (THRR);Able to understand and use rate of perceived exertion (RPE) scale     Comments Pt first day in the [facility] program. Pt tolerated exercise well with an average MET level of 1.4. Pt attempted recumbent bike, but was uncomfortable for her knees, Octane worked out much better. She felt good with her new ExRx and she is off to a good start Reviewed MET's and goals. Pt tolerated exercise well with an average MET level of 2.1. Talked with patient about home ExRx, but she states  this is a lot for exercise right now. Will re-evaluated home ExRx soon. Pt states she feels good about her goals of getting moving and back into exercise. She is also increasing her strength and is able to do more, especially in the yard Reviewed MET's and goals. Pt tolerated exercise well with an average MET level of 2.2. She feels good about her goals and is gaining strength and getting back into an exercise routine.     Expected Outcomes Will continue to increase workloads as tolerated without sign or symtpom Will continue to increase workloads as tolerated without sign or symtpom Will continue to increase workloads as tolerated without sign or symtpom              Discharge Exercise Prescription (Final Exercise Prescription Changes):  Exercise Prescription Changes - 10/31/22 1617       Response to Exercise   Blood Pressure (Admit) 124/60    Blood Pressure (Exercise) 128/80    Blood Pressure (Exit) 122/64    Heart Rate (Admit) 83 bpm    Heart Rate (Exercise) 113 bpm    Heart Rate (Exit) 90 bpm    Rating of Perceived Exertion (Exercise) 11    Perceived Dyspnea (Exercise) 0    Symptoms none    Comments Reviewed MET's and goals    Duration Progress to 30 minutes of  aerobic without signs/symptoms of physical distress    Intensity THRR unchanged      Progression   Progression Continue to progress workloads to maintain intensity without signs/symptoms of physical distress.    Average METs 2.2      Resistance Training   Training Prescription No      NuStep   Level 4    SPM 80    Minutes 30    METs 2.2             Nutrition:  Target Goals: Understanding of nutrition guidelines, daily intake of sodium 1500mg , cholesterol 200mg , calories 30% from fat and 7% or less from saturated fats, daily to have  5 or more servings of fruits and vegetables.  Biometrics:  Pre Biometrics - 08/28/22 1311       Pre Biometrics   Waist Circumference 41 inches    Hip Circumference 45.75  inches    Waist to Hip Ratio 0.9 %    Triceps Skinfold 23 mm    % Body Fat 41.5 %    Grip Strength 28 kg    Flexibility 11.5 in    Single Leg Stand 9.18 seconds              Nutrition Therapy Plan and Nutrition Goals:  Nutrition Therapy & Goals - 10/03/22 1611       Nutrition Therapy   Diet Heart Healthy Diet    Drug/Food Interactions Statins/Certain Fruits      Personal Nutrition Goals   Nutrition Goal Patient to identify strategies for reducing cardiovascular risk by attending the [facility] education and nutrition series weekly.   goal in progress.   Personal Goal #2 Patient to improve diet quality by using the plate method as a guide for meal planning to include lean protein/plant protein, fruits, vegetables, whole grains, nonfat dairy as part of a well-balanced diet.   goal in progress.   Personal Goal #3 Patient to reduce sodium intake to 1500mg  per day.   goal in progress.   Comments Goals in progress. Nadezhda continues to attend the [employer] and nutrition series regularly. Seema reports motivation to learn more about nutrition for heart health/CHF. She reports better understanding of sodium, fiber, and saturated fat recommendations. She has maintained her weight since starting with our program.  Patient will benefit from participation in intensive cardiac rehab for nutrition, exercise, and lifestyle modification.      Intervention Plan   Intervention Prescribe, educate and counsel regarding individualized specific dietary modifications aiming towards targeted core components such as weight, hypertension, lipid management, diabetes, heart failure and other comorbidities.;Nutrition handout(s) given to patient.    Expected Outcomes Short Term Goal: Understand basic principles of dietary content, such as calories, fat, sodium, cholesterol and nutrients.;Long Term Goal: Adherence to prescribed nutrition plan.             Nutrition Assessments:  Nutrition  Assessments - 09/20/22 0827       Rate Your Plate Scores   Pre Score 52            MEDIFICTS Score Key: >=70 Need to make dietary changes  40-70 Heart Healthy Diet <= 40 Therapeutic Level Cholesterol Diet   Flowsheet Row INTENSIVE CARDIAC REHAB from 09/19/2022 in [facility]  Picture Your Plate Total Score on Admission 52      Picture Your Plate Scores: <16 Unhealthy dietary pattern with much room for improvement. 41-50 Dietary pattern unlikely to meet recommendations for good health and room for improvement. 51-60 More healthful dietary pattern, with some room for improvement.  >60 Healthy dietary pattern, although there may be some specific behaviors that could be improved.    Nutrition Goals Re-Evaluation:  Nutrition Goals Re-Evaluation     Row Name 09/06/22 0940 10/03/22 1611           Goals   Current Weight 210 lb 8.6 oz (95.5 kg) 210 lb 5.1 oz (95.4 kg)      Comment lipoproteinA WNL, A1c 6.7 A1c 7.4; other most recent labs lipoproteinA WNL      Expected Outcome Meylin reports motivation to learn more about nutrition for heart health/CHF. Patient  will benefit from participation in intensive cardiac rehab for nutrition, exercise, and lifestyle modification. Goals in progress. Riata continues to attend the [employer] and nutrition series regularly. Theadora reports motivation to learn more about nutrition for heart health/CHF. She reports better understanding of sodium, fiber, and saturated fat recommendations. She has maintained her weight since starting with our program. Patient will benefit from participation in intensive cardiac rehab for nutrition, exercise, and lifestyle modification.               Nutrition Goals Re-Evaluation:  Nutrition Goals Re-Evaluation     Row Name 09/06/22 0940 10/03/22 1611           Goals   Current Weight 210 lb 8.6 oz (95.5 kg) 210 lb 5.1 oz (95.4 kg)       Comment lipoproteinA WNL, A1c 6.7 A1c 7.4; other most recent labs lipoproteinA WNL      Expected Outcome Beadie reports motivation to learn more about nutrition for heart health/CHF. Patient will benefit from participation in intensive cardiac rehab for nutrition, exercise, and lifestyle modification. Goals in progress. Shaquasia continues to attend the [employer] and nutrition series regularly. Rennae reports motivation to learn more about nutrition for heart health/CHF. She reports better understanding of sodium, fiber, and saturated fat recommendations. She has maintained her weight since starting with our program. Patient will benefit from participation in intensive cardiac rehab for nutrition, exercise, and lifestyle modification.               Nutrition Goals Discharge (Final Nutrition Goals Re-Evaluation):  Nutrition Goals Re-Evaluation - 10/03/22 1611       Goals   Current Weight 210 lb 5.1 oz (95.4 kg)    Comment A1c 7.4; other most recent labs lipoproteinA WNL    Expected Outcome Goals in progress. Jonte continues to attend the [employer] and nutrition series regularly. Leena reports motivation to learn more about nutrition for heart health/CHF. She reports better understanding of sodium, fiber, and saturated fat recommendations. She has maintained her weight since starting with our program. Patient will benefit from participation in intensive cardiac rehab for nutrition, exercise, and lifestyle modification.             Psychosocial: Target Goals: Acknowledge presence or absence of significant depression and/or stress, maximize coping skills, provide positive support system. Participant is able to verbalize types and ability to use techniques and skills needed for reducing stress and depression.  Initial Review & Psychosocial Screening:  Initial Psych Review & Screening - 08/28/22 1421       Initial Review   Current issues with Current  Psychotropic Meds;Current Stress Concerns;Current Depression;History of Depression    Source of Stress Concerns Chronic Illness;Occupation;Unable to participate in former interests or hobbies;Unable to perform yard/household activities    Comments Bricia has PTSD from the murder of her son when he was 73 years old. Sapphyre is taking an antidepressant for PTSD. Shaghayegh says she is not interested  in counseling at this time.      Family Dynamics   Good Support System? Yes   Shadonna lives with her daughter and grandchldren. Tanaysha also has a son who she has for support     Barriers   Psychosocial barriers to participate in program The patient should benefit from training in stress management and relaxation.      Screening Interventions   Interventions Encouraged to exercise    Expected Outcomes Long Term Goal: Stressors or current issues are controlled or eliminated.;Short Term  goal: Identification and review with participant of any Quality of Life or Depression concerns found by scoring the questionnaire.;Long Term goal: The participant improves quality of Life and PHQ9 Scores as seen by post scores and/or verbalization of changes             Quality of Life Scores:  Quality of Life - 08/28/22 1450       Quality of Life   Select Quality of Life      Quality of Life Scores   Health/Function Pre 25.29 %    Socioeconomic Pre 22.5 %    Psych/Spiritual Pre 24.86 %    Family Pre 28.5 %    GLOBAL Pre 25.06 %            Scores of 19 and below usually indicate a poorer quality of life in these areas.  A difference of  2-3 points is a clinically meaningful difference.  A difference of 2-3 points in the total score of the Quality of Life Index has been associated with significant improvement in overall quality of life, self-image, physical symptoms, and general health in studies assessing change in quality of life.  PHQ-9: Review Flowsheet       09/07/2022 08/29/2022  Depression  screen PHQ 2/9  Decreased Interest 1 1  Down, Depressed, Hopeless 3 1  PHQ - 2 Score 4 2  Altered sleeping 3 2  Tired, decreased energy 1 1  Change in appetite 3 1  Feeling bad or failure about yourself  0 0  Trouble concentrating 0 0  Moving slowly or fidgety/restless 0 0  Suicidal thoughts 0 0  PHQ-9 Score 11 6  Difficult doing work/chores Somewhat difficult Not difficult at all    Details           Interpretation of Total Score  Total Score Depression Severity:  1-4 = Minimal depression, 5-9 = Mild depression, 10-14 = Moderate depression, 15-19 = Moderately severe depression, 20-27 = Severe depression   Psychosocial Evaluation and Intervention:   Psychosocial Re-Evaluation:  Psychosocial Re-Evaluation     Row Name 10/02/22 1400 10/31/22 1410           Psychosocial Re-Evaluation   Current issues with Current Stress Concerns;Current Psychotropic Meds;Current Depression;History of Depression Current Stress Concerns;Current Psychotropic Meds;Current Depression;History of Depression      Comments Nino deines having any  increased depression or stressors during exercise at cardiac rehab. Shyli continues to deny the need for counselling at this time. Fatma deines having any  increased depression or stressors during exercise at cardiac rehab.      Expected Outcomes Marageret will have controlled or decreased depression/ stressors upon completion of cardiac rehab Marageret will have controlled or decreased depression/ stressors upon completion of cardiac rehab      Interventions Encouraged to attend Cardiac Rehabilitation for the exercise;Stress management education;Relaxation education Encouraged to attend Cardiac Rehabilitation for the exercise;Stress management education;Relaxation education      Continue Psychosocial Services  Follow up required by staff Follow up required by staff        Initial Review   Source of Stress Concerns Chronic Illness;Family;Unable to  participate in former interests or hobbies;Unable to perform yard/household activities;Financial Chronic Illness;Family;Unable to participate in former interests or hobbies;Unable to perform yard/household activities;Financial      Comments Will continue to monitor and offer support as needed Will continue to monitor and offer support as needed  Psychosocial Discharge (Final Psychosocial Re-Evaluation):  Psychosocial Re-Evaluation - 10/31/22 1410       Psychosocial Re-Evaluation   Current issues with Current Stress Concerns;Current Psychotropic Meds;Current Depression;History of Depression    Comments Elvin deines having any  increased depression or stressors during exercise at cardiac rehab.    Expected Outcomes Marageret will have controlled or decreased depression/ stressors upon completion of cardiac rehab    Interventions Encouraged to attend Cardiac Rehabilitation for the exercise;Stress management education;Relaxation education    Continue Psychosocial Services  Follow up required by staff      Initial Review   Source of Stress Concerns Chronic Illness;Family;Unable to participate in former interests or hobbies;Unable to perform yard/household activities;Financial    Comments Will continue to monitor and offer support as needed             Vocational Rehabilitation: Provide vocational rehab assistance to qualifying candidates.   Vocational Rehab Evaluation & Intervention:  Vocational Rehab - 08/28/22 1451       Initial Vocational Rehab Evaluation & Intervention   Assessment shows need for Vocational Rehabilitation No   Patient  says she may be interested in vocational rehab. Given packet to review.            Education: Education Goals: Education classes will be provided on a weekly basis, covering required topics. Participant will state understanding/return demonstration of topics presented.    Education     Row Name 09/05/22 1600      Education   Cardiac Education Topics Pritikin   Customer service manager   Weekly Topic International Cuisine- Spotlight on the [employer] Zones   Instruction Review Code 1- Verbalizes Understanding   Class Start Time 1400   Class Stop Time 1448   Class Time Calculation (min) 48 min    Row Name 09/14/22 1500     Education   Cardiac Education Topics Pritikin   Nurse, children's Exercise Physiologist   Select Psychosocial   Psychosocial How Our Thoughts Can Heal Our Hearts   Instruction Review Code 1- Verbalizes Understanding   Class Start Time 1400   Class Stop Time 1450   Class Time Calculation (min) 50 min    Row Name 09/17/22 1500     Education   Cardiac Education Topics Pritikin   Select Workshops     Workshops   Educator Exercise Physiologist   Select Exercise   Exercise Workshop Managing Heart Disease: Your Path to a Healthier Heart   Instruction Review Code 1- Verbalizes Understanding   Class Start Time 1415   Class Stop Time 1510   Class Time Calculation (min) 55 min    Row Name 09/19/22 1500     Education   Cardiac Education Topics Pritikin   Customer service manager   Weekly Topic Powerhouse Plant-Based Proteins   Instruction Review Code 1- Verbalizes Understanding   Class Start Time 1400   Class Stop Time 1440   Class Time Calculation (min) 40 min    Row Name 09/24/22 1500     Education   Cardiac Education Topics --   Select --     Workshops   Educator --   Select --   Psychosocial Workshop --   Instruction Review Code --   Class Start Time --   Class Stop Time --   Class Time  Calculation (min) --    Row Name 09/26/22 1500     Education   Cardiac Education Topics Pritikin   Customer service manager   Weekly Topic Adding Flavor - Sodium-Free   Instruction Review Code 1- Verbalizes Understanding    Class Start Time 1400   Class Stop Time 1437   Class Time Calculation (min) 37 min    Row Name 10/01/22 1500     Education   Cardiac Education Topics Pritikin   Licensed conveyancer Nutrition   Nutrition Overview of the Pritikin Eating Plan   Instruction Review Code 1- Verbalizes Understanding   Class Start Time 1400   Class Stop Time 1452   Class Time Calculation (min) 52 min    Row Name 10/03/22 1500     Education   Cardiac Education Topics Pritikin   Customer service manager   Weekly Topic Fast and Healthy Breakfasts   Instruction Review Code 1- Verbalizes Understanding   Class Start Time 1400   Class Stop Time 1445   Class Time Calculation (min) 45 min    Row Name 10/10/22 1500     Education   Cardiac Education Topics Pritikin   Customer service manager   Weekly Topic Personalizing Your Pritikin Plate   Instruction Review Code 1- Verbalizes Understanding   Class Start Time 1400   Class Stop Time 1440   Class Time Calculation (min) 40 min    Row Name 10/17/22 1600     Education   Cardiac Education Topics Pritikin   Customer service manager   Weekly Topic [employer] Desserts   Instruction Review Code 1- Verbalizes Understanding   Class Start Time 1400   Class Stop Time 1442   Class Time Calculation (min) 42 min    Row Name 10/19/22 1500     Education   Cardiac Education Topics Pritikin   Licensed conveyancer Nutrition   Nutrition Other  label reading   Instruction Review Code 1- Verbalizes Understanding   Class Start Time 1400   Class Stop Time 1500   Class Time Calculation (min) 60 min    Row Name 10/22/22 1500     Education   Cardiac Education Topics Pritikin   Geographical information systems officer  Psychosocial   Psychosocial Workshop Recognizing and Reducing Stress   Instruction Review Code 1- Verbalizes Understanding   Class Start Time 1400   Class Stop Time 1447   Class Time Calculation (min) 47 min    Row Name 10/24/22 1500     Education   Cardiac Education Topics Pritikin   Orthoptist   Educator Dietitian   Weekly Topic Tasty Appetizers and Snacks   Instruction Review Code 1- Verbalizes Understanding   Class Start Time 1355   Class Stop Time 1433   Class Time Calculation (min) 38 min    Row Name 10/26/22 1400     Education   Cardiac Education Topics Pritikin   Nurse, children's Exercise Physiologist  Select Nutrition   Nutrition Calorie Density   Instruction Review Code 1- Verbalizes Understanding   Class Start Time 1358   Class Stop Time 1432   Class Time Calculation (min) 34 min    Row Name 10/31/22 1600     Education   Cardiac Education Topics Pritikin   Customer service manager   Weekly Topic Efficiency Cooking - Meals in a Snap   Instruction Review Code 1- Verbalizes Understanding   Class Start Time 1355   Class Stop Time 1430   Class Time Calculation (min) 35 min            Core Videos: Exercise    Move It!  Clinical staff conducted group or individual video education with verbal and written material and guidebook.  Patient learns the recommended Pritikin exercise program. Exercise with the goal of living a long, healthy life. Some of the health benefits of exercise include controlled diabetes, healthier blood pressure levels, improved cholesterol levels, improved heart and lung capacity, improved sleep, and better body composition. Everyone should speak with their doctor before starting or changing an exercise routine.  Biomechanical Limitations Clinical staff conducted group or individual video education with verbal and written material and  guidebook.  Patient learns how biomechanical limitations can impact exercise and how we can mitigate and possibly overcome limitations to have an impactful and balanced exercise routine.  Body Composition Clinical staff conducted group or individual video education with verbal and written material and guidebook.  Patient learns that body composition (ratio of muscle mass to fat mass) is a key component to assessing overall fitness, rather than body weight alone. Increased fat mass, especially visceral belly fat, can put [place] at increased risk for metabolic syndrome, type 2 diabetes, heart disease, and even death. It is recommended to combine diet and exercise (cardiovascular and resistance training) to improve your body composition. Seek guidance from your physician and exercise physiologist before implementing an exercise routine.  Exercise Action Plan Clinical staff conducted group or individual video education with verbal and written material and guidebook.  Patient learns the recommended strategies to achieve and enjoy long-term exercise adherence, including variety, self-motivation, self-efficacy, and positive decision making. Benefits of exercise include fitness, good health, weight management, more energy, better sleep, less stress, and overall well-being.  Medical   Heart Disease Risk Reduction Clinical staff conducted group or individual video education with verbal and written material and guidebook.  Patient learns our heart is our most vital organ as it circulates oxygen, nutrients, white blood cells, and hormones throughout the entire body, and carries waste away. Data supports a plant-based eating plan like the Pritikin Program for its effectiveness in slowing progression of and reversing heart disease. The video provides a number of recommendations to address heart disease.   Metabolic Syndrome and Belly Fat  Clinical staff conducted group or individual video education with verbal and  written material and guidebook.  Patient learns what metabolic syndrome is, how it leads to heart disease, and how one can reverse it and keep it from coming back. You have metabolic syndrome if you have 3 of the following 5 criteria: abdominal obesity, high blood pressure, high triglycerides, low HDL cholesterol, and high blood sugar.  Hypertension and Heart Disease Clinical staff conducted group or individual video education with verbal and written material and guidebook.  Patient learns that high blood pressure, or hypertension, is very common in the [place]. Hypertension is largely  due to excessive salt intake, but other important risk factors include being overweight, physical inactivity, drinking too much alcohol, smoking, and not eating enough potassium from fruits and vegetables. High blood pressure is a leading risk factor for heart attack, stroke, congestive heart failure, dementia, kidney failure, and premature death. Long-term effects of excessive salt intake include stiffening of the arteries and thickening of heart muscle and organ damage. Recommendations include ways to reduce hypertension and the risk of heart disease.  Diseases of Our Time - Focusing on Diabetes Clinical staff conducted group or individual video education with verbal and written material and guidebook.  Patient learns why the best way to stop diseases of our time is prevention, through food and other lifestyle changes. Medicine (such as prescription pills and surgeries) is often only a Band-Aid on the problem, not a long-term solution. Most common diseases of our time include obesity, type 2 diabetes, hypertension, heart disease, and cancer. The Pritikin Program is recommended and has been proven to help reduce, reverse, and/or prevent the damaging effects of metabolic syndrome.  Nutrition   Overview of the Pritikin Eating Plan  Clinical staff conducted group or individual video education with verbal and written  material and guidebook.  Patient learns about the Pritikin Eating Plan for disease risk reduction. The Pritikin Eating Plan emphasizes a wide variety of unrefined, minimally-processed carbohydrates, like fruits, vegetables, whole grains, and legumes. Go, Caution, and Stop food choices are explained. Plant-based and lean animal proteins are emphasized. Rationale provided for low sodium intake for blood pressure control, low added sugars for blood sugar stabilization, and low added fats and oils for coronary artery disease risk reduction and weight management.  Calorie Density  Clinical staff conducted group or individual video education with verbal and written material and guidebook.  Patient learns about calorie density and how it impacts the Pritikin Eating Plan. Knowing the characteristics of the food you choose will help you decide whether those foods will lead to weight gain or weight loss, and whether you want to consume more or less of them. Weight loss is usually a side effect of the Pritikin Eating Plan because of its focus on low calorie-dense foods.  Label Reading  Clinical staff conducted group or individual video education with verbal and written material and guidebook.  Patient learns about the Pritikin recommended label reading guidelines and corresponding recommendations regarding calorie density, added sugars, sodium content, and whole grains.  Dining Out - Part 1  Clinical staff conducted group or individual video education with verbal and written material and guidebook.  Patient learns that restaurant meals can be sabotaging because they can be so high in calories, fat, sodium, and/or sugar. Patient learns recommended strategies on how to positively address this and avoid unhealthy pitfalls.  Facts on Fats  Clinical staff conducted group or individual video education with verbal and written material and guidebook.  Patient learns that lifestyle modifications can be just as  effective, if not more so, as many medications for lowering your risk of heart disease. A Pritikin lifestyle can help to reduce your risk of inflammation and atherosclerosis (cholesterol build-up, or plaque, in the artery walls). Lifestyle interventions such as dietary choices and physical activity address the cause of atherosclerosis. A review of the types of fats and their impact on blood cholesterol levels, along with dietary recommendations to reduce fat intake is also included.  Nutrition Action Plan  Clinical staff conducted group or individual video education with verbal and written material and guidebook.  Patient learns how to incorporate Pritikin recommendations into their lifestyle. Recommendations include planning and keeping personal health goals in mind as an important part of their success.  Healthy Mind-Set    Healthy Minds, Bodies, Hearts  Clinical staff conducted group or individual video education with verbal and written material and guidebook.  Patient learns how to identify when they are stressed. Video will discuss the impact of that stress, as well as the many benefits of stress management. Patient will also be introduced to stress management techniques. The way we think, act, and feel has an impact on our hearts.  How Our Thoughts Can Heal Our Hearts  Clinical staff conducted group or individual video education with verbal and written material and guidebook.  Patient learns that negative thoughts can cause depression and anxiety. This can result in negative lifestyle behavior and serious health problems. Cognitive behavioral therapy is an effective method to help control our thoughts in order to change and improve our emotional outlook.  Additional Videos:  Exercise    Improving Performance  Clinical staff conducted group or individual video education with verbal and written material and guidebook.  Patient learns to use a non-linear approach by alternating intensity  levels and lengths of time spent exercising to help burn more calories and lose more body fat. Cardiovascular exercise helps improve heart health, metabolism, hormonal balance, blood sugar control, and recovery from fatigue. Resistance training improves strength, endurance, balance, coordination, reaction time, metabolism, and muscle mass. Flexibility exercise improves circulation, posture, and balance. Seek guidance from your physician and exercise physiologist before implementing an exercise routine and learn your capabilities and proper form for all exercise.  Introduction to Yoga  Clinical staff conducted group or individual video education with verbal and written material and guidebook.  Patient learns about yoga, a discipline of the coming together of mind, breath, and body. The benefits of yoga include improved flexibility, improved range of motion, better posture and core strength, increased lung function, weight loss, and positive self-image. Yoga's heart health benefits include lowered blood pressure, healthier heart rate, decreased cholesterol and triglyceride levels, improved immune function, and reduced stress. Seek guidance from your physician and exercise physiologist before implementing an exercise routine and learn your capabilities and proper form for all exercise.  Medical   Aging: Enhancing Your Quality of Life  Clinical staff conducted group or individual video education with verbal and written material and guidebook.  Patient learns key strategies and recommendations to stay in good physical health and enhance quality of life, such as prevention strategies, having an advocate, securing a Health Care Proxy and Power of Attorney, and keeping a list of medications and system for tracking them. It also discusses how to avoid risk for bone loss.  Biology of Weight Control  Clinical staff conducted group or individual video education with verbal and written material and guidebook.   Patient learns that weight gain occurs because we consume more calories than we burn (eating more, moving less). Even if your body weight is normal, you may have higher ratios of fat compared to muscle mass. Too much body fat puts you at increased risk for cardiovascular disease, heart attack, stroke, type 2 diabetes, and obesity-related cancers. In addition to exercise, following the Pritikin Eating Plan can help reduce your risk.  Decoding Lab Results  Clinical staff conducted group or individual video education with verbal and written material and guidebook.  Patient learns that lab test reflects one measurement whose values change over time and are influenced  by many factors, including medication, stress, sleep, exercise, food, hydration, pre-existing medical conditions, and more. It is recommended to use the knowledge from this video to become more involved with your lab results and evaluate your numbers to speak with your doctor.   Diseases of Our Time - Overview  Clinical staff conducted group or individual video education with verbal and written material and guidebook.  Patient learns that according to the CDC, 50% to 70% of chronic diseases (such as obesity, type 2 diabetes, elevated lipids, hypertension, and heart disease) are avoidable through lifestyle improvements including healthier food choices, listening to satiety cues, and increased physical activity.  Sleep Disorders Clinical staff conducted group or individual video education with verbal and written material and guidebook.  Patient learns how good quality and duration of sleep are important to overall health and well-being. Patient also learns about sleep disorders and how they impact health along with recommendations to address them, including discussing with a physician.  Nutrition  Dining Out - Part 2 Clinical staff conducted group or individual video education with verbal and written material and guidebook.  Patient learns  how to plan ahead and communicate in order to maximize their dining experience in a healthy and nutritious manner. Included are recommended food choices based on the type of restaurant the patient is visiting.   Fueling a Banker conducted group or individual video education with verbal and written material and guidebook.  There is a strong connection between our food choices and our health. Diseases like obesity and type 2 diabetes are very prevalent and are in large-part due to lifestyle choices. The Pritikin Eating Plan provides plenty of food and hunger-curbing satisfaction. It is easy to follow, affordable, and helps reduce health risks.  Menu Workshop  Clinical staff conducted group or individual video education with verbal and written material and guidebook.  Patient learns that restaurant meals can sabotage health goals because they are often packed with calories, fat, sodium, and sugar. Recommendations include strategies to plan ahead and to communicate with the manager, chef, or server to help order a healthier meal.  Planning Your Eating Strategy  Clinical staff conducted group or individual video education with verbal and written material and guidebook.  Patient learns about the Pritikin Eating Plan and its benefit of reducing the risk of disease. The Pritikin Eating Plan does not focus on calories. Instead, it emphasizes high-quality, nutrient-rich foods. By knowing the characteristics of the foods, we choose, we can determine their calorie density and make informed decisions.  Targeting Your Nutrition Priorities  Clinical staff conducted group or individual video education with verbal and written material and guidebook.  Patient learns that lifestyle habits have a tremendous impact on disease risk and progression. This video provides eating and physical activity recommendations based on your personal health goals, such as reducing LDL cholesterol, losing weight,  preventing or controlling type 2 diabetes, and reducing high blood pressure.  Vitamins and Minerals  Clinical staff conducted group or individual video education with verbal and written material and guidebook.  Patient learns different ways to obtain key vitamins and minerals, including through a recommended healthy diet. It is important to discuss all supplements you take with your doctor.   Healthy Mind-Set    Smoking Cessation  Clinical staff conducted group or individual video education with verbal and written material and guidebook.  Patient learns that cigarette smoking and tobacco addiction pose a serious health risk which affects millions of people. Stopping smoking will  significantly reduce the risk of heart disease, lung disease, and many forms of cancer. Recommended strategies for quitting are covered, including working with your doctor to develop a successful plan.  Culinary   Becoming a Set designer conducted group or individual video education with verbal and written material and guidebook.  Patient learns that cooking at home can be healthy, cost-effective, quick, and puts them in control. Keys to cooking healthy recipes will include looking at your recipe, assessing your equipment needs, planning ahead, making it simple, choosing cost-effective seasonal ingredients, and limiting the use of added fats, salts, and sugars.  Cooking - Breakfast and Snacks  Clinical staff conducted group or individual video education with verbal and written material and guidebook.  Patient learns how important breakfast is to satiety and nutrition through the entire day. Recommendations include key foods to eat during breakfast to help stabilize blood sugar levels and to prevent overeating at meals later in the day. Planning ahead is also a key component.  Cooking - Educational psychologist conducted group or individual video education with verbal and written material and  guidebook.  Patient learns eating strategies to improve overall health, including an approach to cook more at home. Recommendations include thinking of animal protein as a side on your plate rather than center stage and focusing instead on lower calorie dense options like vegetables, fruits, whole grains, and plant-based proteins, such as beans. Making sauces in large quantities to freeze for later and leaving the skin on your vegetables are also recommended to maximize your experience.  Cooking - Healthy Salads and Dressing Clinical staff conducted group or individual video education with verbal and written material and guidebook.  Patient learns that vegetables, fruits, whole grains, and legumes are the foundations of the Pritikin Eating Plan. Recommendations include how to incorporate each of these in flavorful and healthy salads, and how to create homemade salad dressings. Proper handling of ingredients is also covered. Cooking - Soups and [employer] - Soups and Desserts Clinical staff conducted group or individual video education with verbal and written material and guidebook.  Patient learns that Pritikin soups and desserts make for easy, nutritious, and delicious snacks and meal components that are low in sodium, fat, sugar, and calorie density, while high in vitamins, minerals, and filling fiber. Recommendations include simple and healthy ideas for soups and desserts.   Overview     The Pritikin Solution Program Overview Clinical staff conducted group or individual video education with verbal and written material and guidebook.  Patient learns that the results of the Pritikin Program have been documented in more than 100 articles published in peer-reviewed journals, and the benefits include reducing risk factors for (and, in some cases, even reversing) high cholesterol, high blood pressure, type 2 diabetes, obesity, and more! An overview of the three key pillars of the Pritikin Program  will be covered: eating well, doing regular exercise, and having a healthy mind-set.  WORKSHOPS  Exercise: Exercise Basics: Building Your Action Plan Clinical staff led group instruction and group discussion with PowerPoint presentation and patient guidebook. To enhance the learning environment the use of posters, models and videos may be added. At the conclusion of this workshop, patients will comprehend the difference between physical activity and exercise, as well as the benefits of incorporating both, into their routine. Patients will understand the FITT (Frequency, Intensity, Time, and Type) principle and how to use it to build an exercise action plan. In addition, safety  concerns and other considerations for exercise and cardiac rehab will be addressed by the presenter. The purpose of this lesson is to promote a comprehensive and effective weekly exercise routine in order to improve patients' overall level of fitness.   Managing Heart Disease: Your Path to a Healthier Heart Clinical staff led group instruction and group discussion with PowerPoint presentation and patient guidebook. To enhance the learning environment the use of posters, models and videos may be added.At the conclusion of this workshop, patients will understand the anatomy and physiology of the heart. Additionally, they will understand how Pritikin's three pillars impact the risk factors, the progression, and the management of heart disease.  The purpose of this lesson is to provide a high-level overview of the heart, heart disease, and how the Pritikin lifestyle positively impacts risk factors.  Exercise Biomechanics Clinical staff led group instruction and group discussion with PowerPoint presentation and patient guidebook. To enhance the learning environment the use of posters, models and videos may be added. Patients will learn how the structural parts of their bodies function and how these functions impact their daily  activities, movement, and exercise. Patients will learn how to promote a neutral spine, learn how to manage pain, and identify ways to improve their physical movement in order to promote healthy living. The purpose of this lesson is to expose patients to common physical limitations that impact physical activity. Participants will learn practical ways to adapt and manage aches and pains, and to minimize their effect on regular exercise. Patients will learn how to maintain good posture while sitting, walking, and lifting.  Balance Training and Fall Prevention  Clinical staff led group instruction and group discussion with PowerPoint presentation and patient guidebook. To enhance the learning environment the use of posters, models and videos may be added. At the conclusion of this workshop, patients will understand the importance of their sensorimotor skills (vision, proprioception, and the vestibular system) in maintaining their ability to balance as they age. Patients will apply a variety of balancing exercises that are appropriate for their current level of function. Patients will understand the common causes for poor balance, possible solutions to these problems, and ways to modify their physical environment in order to minimize their fall risk. The purpose of this lesson is to teach patients about the importance of maintaining balance as they age and ways to minimize their risk of falling.  WORKSHOPS   Nutrition:  Fueling a Ship broker led group instruction and group discussion with PowerPoint presentation and patient guidebook. To enhance the learning environment the use of posters, models and videos may be added. Patients will review the foundational principles of the Pritikin Eating Plan and understand what constitutes a serving size in each of the food groups. Patients will also learn Pritikin-friendly foods that are better choices when away from home and review make-ahead  meal and snack options. Calorie density will be reviewed and applied to three nutrition priorities: weight maintenance, weight loss, and weight gain. The purpose of this lesson is to reinforce (in a group setting) the key concepts around what patients are recommended to eat and how to apply these guidelines when away from home by planning and selecting Pritikin-friendly options. Patients will understand how calorie density may be adjusted for different weight management goals.  Mindful Eating  Clinical staff led group instruction and group discussion with PowerPoint presentation and patient guidebook. To enhance the learning environment the use of posters, models and videos may be added. Patients will  briefly review the concepts of the Pritikin Eating Plan and the importance of low-calorie dense foods. The concept of mindful eating will be introduced as well as the importance of paying attention to internal hunger signals. Triggers for non-hunger eating and techniques for dealing with triggers will be explored. The purpose of this lesson is to provide patients with the opportunity to review the basic principles of the Pritikin Eating Plan, discuss the value of eating mindfully and how to measure internal cues of hunger and fullness using the Hunger Scale. Patients will also discuss reasons for non-hunger eating and learn strategies to use for controlling emotional eating.  Targeting Your Nutrition Priorities Clinical staff led group instruction and group discussion with PowerPoint presentation and patient guidebook. To enhance the learning environment the use of posters, models and videos may be added. Patients will learn how to determine their genetic susceptibility to disease by reviewing their family history. Patients will gain insight into the importance of diet as part of an overall healthy lifestyle in mitigating the impact of genetics and other environmental insults. The purpose of this lesson is to  provide patients with the opportunity to assess their personal nutrition priorities by looking at their family history, their own health history and current risk factors. Patients will also be able to discuss ways of prioritizing and modifying the Pritikin Eating Plan for their highest risk areas  Menu  Clinical staff led group instruction and group discussion with PowerPoint presentation and patient guidebook. To enhance the learning environment the use of posters, models and videos may be added. Using menus brought in from [employer], or printed from [employer], patients will apply the Pritikin dining out guidelines that were presented in the [employer] video. Patients will also be able to practice these guidelines in a variety of provided scenarios. The purpose of this lesson is to provide patients with the opportunity to practice hands-on learning of the Pritikin Dining Out guidelines with actual menus and practice scenarios.  Label Reading Clinical staff led group instruction and group discussion with PowerPoint presentation and patient guidebook. To enhance the learning environment the use of posters, models and videos may be added. Patients will review and discuss the Pritikin label reading guidelines presented in Pritikin's Label Reading Educational series video. Using fool labels brought in from local grocery stores and markets, patients will apply the label reading guidelines and determine if the packaged food meet the Pritikin guidelines. The purpose of this lesson is to provide patients with the opportunity to review, discuss, and practice hands-on learning of the Pritikin Label Reading guidelines with actual packaged food labels. Cooking School  Pritikin's [employer] are designed to teach patients ways to prepare quick, simple, and affordable recipes at home. The importance of nutrition's role in chronic disease risk reduction is reflected in its  emphasis in the overall Pritikin program. By learning how to prepare essential core Pritikin Eating Plan recipes, patients will increase control over what they eat; be able to customize the flavor of foods without the use of added salt, sugar, or fat; and improve the quality of the food they consume. By learning a set of core recipes which are easily assembled, quickly prepared, and affordable, patients are more likely to prepare more healthy foods at home. These workshops focus on convenient breakfasts, simple entres, side dishes, and desserts which can be prepared with minimal effort and are consistent with nutrition recommendations for cardiovascular risk reduction. Cooking [employer] are taught by  a Armed forces logistics/support/administrative officer (RD) who has been trained by the [employer] team. The chef or RD has a clear understanding of the importance of minimizing - if not completely eliminating - added fat, sugar, and sodium in recipes. Throughout the series of Cooking School Workshop sessions, patients will learn about healthy ingredients and efficient methods of cooking to build confidence in their capability to prepare    Cooking School weekly topics:  Adding Flavor- Sodium-Free  Fast and Healthy Breakfasts  Powerhouse Plant-Based Proteins  Satisfying Salads and Dressings  Simple Sides and Sauces  International Cuisine-Spotlight on the [employer] Zones  Delicious Desserts  Savory Soups  [employer] - Meals in a Astronomer Appetizers and Snacks  Comforting Weekend Breakfasts  One-Pot Wonders   Fast Evening Meals  Landscape architect Your Pritikin Plate  WORKSHOPS   Healthy Mindset (Psychosocial):  Focused Goals, Sustainable Changes Clinical staff led group instruction and group discussion with PowerPoint presentation and patient guidebook. To enhance the learning environment the use of posters, models and videos may be added. Patients will be able to apply effective  goal setting strategies to establish at least one personal goal, and then take consistent, meaningful action toward that goal. They will learn to identify common barriers to achieving personal goals and develop strategies to overcome them. Patients will also gain an understanding of how our mind-set can impact our ability to achieve goals and the importance of cultivating a positive and growth-oriented mind-set. The purpose of this lesson is to provide patients with a deeper understanding of how to set and achieve personal goals, as well as the tools and strategies needed to overcome common obstacles which may arise along the way.  From Head to Heart: The Power of a Healthy Outlook  Clinical staff led group instruction and group discussion with PowerPoint presentation and patient guidebook. To enhance the learning environment the use of posters, models and videos may be added. Patients will be able to recognize and describe the impact of emotions and mood on physical health. They will discover the importance of self-care and explore self-care practices which may work for them. Patients will also learn how to utilize the 4 C's to cultivate a healthier outlook and better manage stress and challenges. The purpose of this lesson is to demonstrate to patients how a healthy outlook is an essential part of maintaining good health, especially as they continue their cardiac rehab journey.  Healthy Sleep for a Healthy Heart Clinical staff led group instruction and group discussion with PowerPoint presentation and patient guidebook. To enhance the learning environment the use of posters, models and videos may be added. At the conclusion of this workshop, patients will be able to demonstrate knowledge of the importance of sleep to overall health, well-being, and quality of life. They will understand the symptoms of, and treatments for, common sleep disorders. Patients will also be able to identify daytime and nighttime  behaviors which impact sleep, and they will be able to apply these tools to help manage sleep-related challenges. The purpose of this lesson is to provide patients with a general overview of sleep and outline the importance of quality sleep. Patients will learn about a few of the most common sleep disorders. Patients will also be introduced to the concept of "sleep hygiene," and discover ways to self-manage certain sleeping problems through simple daily behavior changes. Finally, the workshop will motivate patients by clarifying the links between quality sleep and their goals of heart-healthy  living.   Recognizing and Reducing Stress Clinical staff led group instruction and group discussion with PowerPoint presentation and patient guidebook. To enhance the learning environment the use of posters, models and videos may be added. At the conclusion of this workshop, patients will be able to understand the types of stress reactions, differentiate between acute and chronic stress, and recognize the impact that chronic stress has on their health. They will also be able to apply different coping mechanisms, such as reframing negative self-talk. Patients will have the opportunity to practice a variety of stress management techniques, such as deep abdominal breathing, progressive muscle relaxation, and/or guided imagery.  The purpose of this lesson is to educate patients on the role of stress in their lives and to provide healthy techniques for coping with it.  Learning Barriers/Preferences:  Learning Barriers/Preferences - 08/28/22 1612       Learning Barriers/Preferences   Learning Barriers Sight   wears glasses.   Learning Preferences Audio;Skilled Demonstration             Education Topics:  Knowledge Questionnaire Score:  Knowledge Questionnaire Score - 08/28/22 1438       Knowledge Questionnaire Score   Pre Score 22/24             Core Components/Risk Factors/Patient Goals at  Admission:  Personal Goals and Risk Factors at Admission - 08/28/22 1442       Core Components/Risk Factors/Patient Goals on Admission    Weight Management Yes;Obesity;Weight Loss    Intervention Weight Management/Obesity: Establish reasonable short term and long term weight goals.;Obesity: Provide education and appropriate resources to help participant work on and attain dietary goals.    Admit Weight 209 lb 10.5 oz (95.1 kg)    Expected Outcomes Short Term: Continue to assess and modify interventions until short term weight is achieved;Long Term: Adherence to nutrition and physical activity/exercise program aimed toward attainment of established weight goal;Weight Loss: Understanding of general recommendations for a balanced deficit meal plan, which promotes 1-2 lb weight loss per week and includes a negative energy balance of [phone] kcal/d    Diabetes Yes    Intervention Provide education about signs/symptoms and action to take for hypo/hyperglycemia.;Provide education about proper nutrition, including hydration, and aerobic/resistive exercise prescription along with prescribed medications to achieve blood glucose in normal ranges: Fasting glucose 65-99 mg/dL    Expected Outcomes Short Term: Participant verbalizes understanding of the signs/symptoms and immediate care of hyper/hypoglycemia, proper foot care and importance of medication, aerobic/resistive exercise and nutrition plan for blood glucose control.;Long Term: Attainment of HbA1C < 7%.    Heart Failure Yes    Intervention Provide a combined exercise and nutrition program that is supplemented with education, support and counseling about heart failure. Directed toward relieving symptoms such as shortness of breath, decreased exercise tolerance, and extremity edema.    Expected Outcomes Improve functional capacity of life;Short term: Attendance in program 2-3 days a week with increased exercise capacity. Reported lower sodium intake.  Reported increased fruit and vegetable intake. Reports medication compliance.;Short term: Daily weights obtained and reported for increase. Utilizing diuretic protocols set by physician.;Long term: Adoption of self-care skills and reduction of barriers for early signs and symptoms recognition and intervention leading to self-care maintenance.    Hypertension Yes    Intervention Provide education on lifestyle modifcations including regular physical activity/exercise, weight management, moderate sodium restriction and increased consumption of fresh fruit, vegetables, and low fat dairy, alcohol moderation, and smoking cessation.;Monitor prescription use compliance.  Expected Outcomes Short Term: Continued assessment and intervention until BP is < 140/33mm HG in hypertensive participants. < 130/35mm HG in hypertensive participants with diabetes, heart failure or chronic kidney disease.;Long Term: Maintenance of blood pressure at goal levels.    Lipids Yes    Intervention Provide education and support for participant on nutrition & aerobic/resistive exercise along with prescribed medications to achieve LDL 70mg , HDL >40mg .    Expected Outcomes Short Term: Participant states understanding of desired cholesterol values and is compliant with medications prescribed. Participant is following exercise prescription and nutrition guidelines.;Long Term: Cholesterol controlled with medications as prescribed, with individualized exercise RX and with personalized nutrition plan. Value goals: LDL < 70mg , HDL > 40 mg.    Stress Yes    Intervention Offer individual and/or small group education and counseling on adjustment to heart disease, stress management and health-related lifestyle change. Teach and support self-help strategies.    Expected Outcomes Short Term: Participant demonstrates changes in health-related behavior, relaxation and other stress management skills, ability to obtain effective social support, and  compliance with psychotropic medications if prescribed.;Long Term: Emotional wellbeing is indicated by absence of clinically significant psychosocial distress or social isolation.             Core Components/Risk Factors/Patient Goals Review:   Goals and Risk Factor Review     Row Name 09/05/22 1706 10/02/22 1405 10/31/22 1412         Core Components/Risk Factors/Patient Goals Review   Personal Goals Review Weight Management/Obesity;Heart Failure;Stress;Hypertension;Lipids Weight Management/Obesity;Heart Failure;Stress;Hypertension;Lipids Weight Management/Obesity;Heart Failure;Stress;Hypertension;Lipids     Review Jory started cardiac rehab on 09/05/22 and did well with exercise. Vital signs and CBG's were stable Kortnee is doing well with exercise at cardiac rehab . Vital signs and CBG's have been stable. Jany has lost 1.3 kg since starting the program. Riha is doing well with exercise at cardiac rehab . Vital signs and CBG's have been stable. Janyne has lost 2.1 kg since starting the program.     Expected Outcomes Milessa will continue to participate in cardiac rehab for exercise, nutrtion and lifestyle modifcations. Karensa will continue to participate in cardiac rehab for exercise, nutrtion and lifestyle modifcations. Trionna will continue to participate in cardiac rehab for exercise, nutrtion and lifestyle modifcations.              Core Components/Risk Factors/Patient Goals at Discharge (Final Review):   Goals and Risk Factor Review - 10/31/22 1412       Core Components/Risk Factors/Patient Goals Review   Personal Goals Review Weight Management/Obesity;Heart Failure;Stress;Hypertension;Lipids    Review Venia is doing well with exercise at cardiac rehab . Vital signs and CBG's have been stable. Madysun has lost 2.1 kg since starting the program.    Expected Outcomes Dannae will continue to participate in cardiac rehab for exercise, nutrtion and lifestyle  modifcations.             ITP Comments:  ITP Comments     Row Name 08/28/22 1311 09/05/22 1705 10/02/22 1358 10/31/22 1409     ITP Comments Medical Director- Dr. Armanda Magic, MD. Introduction to the Pritikin Education Program / Intensive Cardiac Rehab. Reviewed initial orientation folder with patient. 30 Day ITP Review. Marquasia started cardiac rehab on 09/05/22 and did well with exercise 30 Day ITP Review. Briellah has good attendance and participation in cardiac rehab 30 Day ITP Review. Deshanda has good attendance and participation in cardiac rehab.  Comments: See ITP comments.Thayer Headings RN BSN

## 2022-11-01 ENCOUNTER — Ambulatory Visit (INDEPENDENT_AMBULATORY_CARE_PROVIDER_SITE_OTHER): Payer: 59 | Admitting: Orthopedic Surgery

## 2022-11-01 DIAGNOSIS — M170 Bilateral primary osteoarthritis of knee: Secondary | ICD-10-CM

## 2022-11-02 ENCOUNTER — Encounter (HOSPITAL_COMMUNITY)
Admission: RE | Admit: 2022-11-02 | Discharge: 2022-11-02 | Disposition: A | Discharge status: Home / Self Care | Payer: 59 | Source: Ambulatory Visit | Attending: Cardiology | Admitting: Cardiology

## 2022-11-02 ENCOUNTER — Ambulatory Visit (HOSPITAL_COMMUNITY): Payer: 59

## 2022-11-02 DIAGNOSIS — I5022 Chronic systolic (congestive) heart failure: Secondary | ICD-10-CM

## 2022-11-03 ENCOUNTER — Encounter: Payer: Self-pay | Admitting: Orthopedic Surgery

## 2022-11-03 DIAGNOSIS — M170 Bilateral primary osteoarthritis of knee: Secondary | ICD-10-CM | POA: Diagnosis not present

## 2022-11-03 NOTE — Progress Notes (Signed)
Office Visit Note   Patient: Stacey Fletcher           Date of Birth: 01/06/1951           MRN: [id number] Visit Date: 11/01/2022              Requested by: Garnette Gunner, MD [address] [place],  [place] [address] PCP: Garnette Gunner, MD  Chief Complaint  Patient presents with   Left Knee - Follow-up   Right Knee - Follow-up      HPI: Patient is a 72 year old woman who presents in follow-up for osteoarthritis both knees.  Patient has been using Voltaren gel and has had interval relief with previous steroid injections.  Assessment & Plan: Visit Diagnoses:  1. Bilateral primary osteoarthritis of knee     Plan: Recommended continue cardiovascular exercise and strength training for her knees.  Follow-Up Instructions: No follow-ups on file.   Ortho Exam  Patient is alert, oriented, no adenopathy, well-dressed, normal affect, normal respiratory effort. Examination there is no effusion of either knee no redness no cellulitis.  There is crepitation with range of motion collaterals and cruciates are stable.  Imaging: No results found. No images are attached to the encounter.  Labs: Lab Results  Component Value Date   HGBA1C 7.4 (A) 09/07/2022   HGBA1C 6.7 (H) 07/20/2021   HGBA1C 6.8 (H) 05/02/2020   ESRSEDRATE 13 08/03/2009   REPTSTATUS 05/11/2013 FINAL 05/07/2013   CULT  05/07/2013    NO SALMONELLA, SHIGELLA, CAMPYLOBACTER, YERSINIA, OR E.COLI 0157:H7 ISOLATED Performed at [employer]   [facility] ESCHERICHIA COLI 05/06/2013     Lab Results  Component Value Date   ALBUMIN 3.8 08/10/2021   ALBUMIN 3.7 05/02/2020   ALBUMIN 4.3 08/05/2016    Lab Results  Component Value Date   MG 1.9 07/20/2021   MG 1.7 05/06/2013   MG 1.9 10/20/2011   Lab Results  Component Value Date   VD25OH 30 03/01/2009    No results found for: "PREALBUMIN"    Latest Ref Rng & Units 07/23/2021    2:31 AM 07/22/2021   12:57 AM 07/21/2021    2:21 PM  CBC  EXTENDED  WBC 4.0 - 10.5 K/uL 8.6  7.7    RBC 3.87 - 5.11 MIL/uL 4.93  4.51    Hemoglobin 12.0 - 15.0 g/dL 01.0  93.2  35.5   HCT 36.0 - 46.0 % 39.7  36.3  36.0   Platelets 150 - 400 K/uL 241  223       There is no height or weight on file to calculate BMI.  Orders:  No orders of the defined types were placed in this encounter.  No orders of the defined types were placed in this encounter.    Procedures: Large Joint Inj: bilateral knee on 11/03/2022 9:39 AM Indications: pain and diagnostic evaluation Details: 22 G 1.5 in needle, anteromedial approach  Arthrogram: No  Outcome: tolerated well, no immediate complications Procedure, treatment alternatives, risks and benefits explained, specific risks discussed. Consent was given by the patient. Immediately prior to procedure a time out was called to verify the correct patient, procedure, equipment, support staff and site/side marked as required. Patient was prepped and draped in the usual sterile fashion.      Clinical Data: No additional findings.  ROS:  All other systems negative, except as noted in the HPI. Review of Systems  Objective: Vital Signs: There were no vitals taken for this visit.  Specialty  Comments:  No specialty comments available.  PMFS History: Patient Active Problem List   Diagnosis Date Noted   Chronic combined systolic and diastolic heart failure (HCC) 09/10/2022   Severe mitral regurgitation 12/14/2021   Acute respiratory failure with hypoxia (HCC) 07/20/2021   Elevated troponin 07/20/2021   Acute CHF (congestive heart failure) (HCC) 07/20/2021   Pneumonia    Osteoarthritis    MI (myocardial infarction) (HCC)    Hypertension    Goiter    Fibromyalgia    Depression    Coronary artery disease    Arthritis of carpometacarpal (CMC) joint of right thumb 07/15/2018   Pain of right hand 07/15/2018   ARF (acute renal failure) (HCC) 08/20/2014   Chest pain 05/06/2013   Nausea vomiting and  diarrhea 05/06/2013   Fever 05/06/2013   HTN (hypertension) 05/06/2013   Hypothyroidism 05/06/2013   Diabetes mellitus (HCC) 05/06/2013   Past Medical History:  Diagnosis Date   Coronary artery disease    Depression    Diabetes mellitus    Fibromyalgia    Goiter    Hypertension    MI (myocardial infarction) (HCC)    Osteoarthritis    Pneumonia    10+ years ago.    Family History  Problem Relation Age of Onset   Hypertension Mother    Diabetes Mother    Stroke Mother    Hypertension Other    Diabetes Other     Past Surgical History:  Procedure Laterality Date   ABDOMINAL HYSTERECTOMY     BREAST SURGERY     BUBBLE STUDY  12/14/2021   Procedure: BUBBLE STUDY;  Surgeon: Christell Constant, MD;  Location: [facility] ENDOSCOPY;  Service: Cardiovascular;;   CARDIAC CATHETERIZATION     1995   REDUCTION MAMMAPLASTY Bilateral    RIGHT/LEFT HEART CATH AND CORONARY ANGIOGRAPHY N/A 07/21/2021   Procedure: RIGHT/LEFT HEART CATH AND CORONARY ANGIOGRAPHY;  Surgeon: Runell Gess, MD;  Location: [facility] INVASIVE CV LAB;  Service: Cardiovascular;  Laterality: N/A;   TEE WITHOUT CARDIOVERSION N/A 12/14/2021   Procedure: TRANSESOPHAGEAL ECHOCARDIOGRAM (TEE);  Surgeon: Christell Constant, MD;  Location: [facility] ENDOSCOPY;  Service: Cardiovascular;  Laterality: N/A;   Social History   Occupational History   Occupation: retired  Tobacco Use   Smoking status: Former    Current packs/day: 0.00    Types: Cigarettes    Quit date: 07/07/2018    Years since quitting: 4.3    Passive exposure: Never   Smokeless tobacco: Never  Vaping Use   Vaping status: Never Used  Substance and Sexual Activity   Alcohol use: No   Drug use: No   Sexual activity: Not Currently

## 2022-11-05 ENCOUNTER — Encounter (HOSPITAL_COMMUNITY): Payer: 59

## 2022-11-05 ENCOUNTER — Ambulatory Visit (HOSPITAL_COMMUNITY): Payer: 59

## 2022-11-05 LAB — GLUCOSE, CAPILLARY: Glucose-Capillary: 152 mg/dL — ABNORMAL HIGH (ref 70–99)

## 2022-11-06 ENCOUNTER — Emergency Department (HOSPITAL_COMMUNITY)
Admission: EM | Admit: 2022-11-06 | Discharge: 2022-11-07 | Disposition: A | Discharge status: Home / Self Care | Payer: 59 | Attending: Emergency Medicine | Admitting: Emergency Medicine

## 2022-11-06 ENCOUNTER — Other Ambulatory Visit: Payer: Self-pay

## 2022-11-06 ENCOUNTER — Emergency Department (HOSPITAL_COMMUNITY): Payer: 59

## 2022-11-06 DIAGNOSIS — I509 Heart failure, unspecified: Secondary | ICD-10-CM | POA: Insufficient documentation

## 2022-11-06 DIAGNOSIS — R079 Chest pain, unspecified: Secondary | ICD-10-CM

## 2022-11-06 DIAGNOSIS — Z7982 Long term (current) use of aspirin: Secondary | ICD-10-CM | POA: Insufficient documentation

## 2022-11-06 DIAGNOSIS — Z5189 Encounter for other specified aftercare: Secondary | ICD-10-CM | POA: Insufficient documentation

## 2022-11-06 DIAGNOSIS — Z79899 Other long term (current) drug therapy: Secondary | ICD-10-CM | POA: Diagnosis not present

## 2022-11-06 DIAGNOSIS — Z7984 Long term (current) use of oral hypoglycemic drugs: Secondary | ICD-10-CM | POA: Diagnosis not present

## 2022-11-06 DIAGNOSIS — R0789 Other chest pain: Secondary | ICD-10-CM | POA: Insufficient documentation

## 2022-11-06 DIAGNOSIS — E119 Type 2 diabetes mellitus without complications: Secondary | ICD-10-CM | POA: Diagnosis not present

## 2022-11-06 DIAGNOSIS — J181 Lobar pneumonia, unspecified organism: Secondary | ICD-10-CM | POA: Diagnosis not present

## 2022-11-06 DIAGNOSIS — J189 Pneumonia, unspecified organism: Secondary | ICD-10-CM

## 2022-11-06 DIAGNOSIS — I110 Hypertensive heart disease with heart failure: Secondary | ICD-10-CM | POA: Insufficient documentation

## 2022-11-06 DIAGNOSIS — I5022 Chronic systolic (congestive) heart failure: Secondary | ICD-10-CM | POA: Insufficient documentation

## 2022-11-06 NOTE — ED Triage Notes (Signed)
Pt arrives to ED c/o substernal non-radiating CP x 1 day. Pt denies SOB. No N/V reported. Pt with cardiac hx

## 2022-11-07 ENCOUNTER — Ambulatory Visit (HOSPITAL_COMMUNITY): Payer: 59

## 2022-11-07 ENCOUNTER — Encounter (HOSPITAL_COMMUNITY)
Admission: RE | Admit: 2022-11-07 | Discharge: 2022-11-07 | Disposition: A | Discharge status: Home / Self Care | Payer: 59 | Source: Ambulatory Visit | Attending: Cardiology | Admitting: Cardiology

## 2022-11-07 DIAGNOSIS — Z5189 Encounter for other specified aftercare: Secondary | ICD-10-CM | POA: Insufficient documentation

## 2022-11-07 DIAGNOSIS — I5022 Chronic systolic (congestive) heart failure: Secondary | ICD-10-CM | POA: Insufficient documentation

## 2022-11-07 DIAGNOSIS — J181 Lobar pneumonia, unspecified organism: Secondary | ICD-10-CM | POA: Diagnosis not present

## 2022-11-07 DIAGNOSIS — R079 Chest pain, unspecified: Secondary | ICD-10-CM | POA: Diagnosis not present

## 2022-11-07 LAB — BASIC METABOLIC PANEL
Anion gap: 12 (ref 5–15)
BUN: 18 mg/dL (ref 8–23)
CO2: 25 mmol/L (ref 22–32)
Calcium: 9.8 mg/dL (ref 8.9–10.3)
Chloride: 105 mmol/L (ref 98–111)
Creatinine, Ser: 1.03 mg/dL — ABNORMAL HIGH (ref 0.44–1.00)
GFR, Estimated: 57 mL/min — ABNORMAL LOW (ref >60)
Glucose, Bld: 120 mg/dL — ABNORMAL HIGH (ref 70–99)
Potassium: 4 mmol/L (ref 3.5–5.1)
Sodium: 142 mmol/L (ref 135–145)

## 2022-11-07 LAB — CBC
HCT: 44.6 % (ref 36.0–46.0)
Hemoglobin: 13.8 g/dL (ref 12.0–15.0)
MCH: 26.3 pg (ref 26.0–34.0)
MCHC: 30.9 g/dL (ref 30.0–36.0)
MCV: 85 fL (ref 80.0–100.0)
Platelets: 265 10*3/uL (ref 150–400)
RBC: 5.25 MIL/uL — ABNORMAL HIGH (ref 3.87–5.11)
RDW: 14.9 % (ref 11.5–15.5)
WBC: 10 10*3/uL (ref 4.0–10.5)
nRBC: 0 % (ref 0.0–0.2)

## 2022-11-07 LAB — TROPONIN I (HIGH SENSITIVITY)
Troponin I (High Sensitivity): 17 ng/L (ref <18)
Troponin I (High Sensitivity): 19 ng/L — ABNORMAL HIGH (ref <18)

## 2022-11-07 MED ORDER — DOXYCYCLINE HYCLATE 100 MG PO CAPS: 100.0000 mg | ORAL_CAPSULE | Freq: Two times a day (BID) | ORAL | 0 refills | Status: AC

## 2022-11-07 MED ORDER — SODIUM CHLORIDE 0.9 % IV SOLN
1.0000 g | Freq: Once | INTRAVENOUS | Status: AC
  Administered 2022-11-07: 1 g via INTRAVENOUS
  Filled 2022-11-07: qty 10

## 2022-11-07 MED ORDER — DOXYCYCLINE HYCLATE 100 MG PO TABS
100.0000 mg | ORAL_TABLET | Freq: Once | ORAL | Status: AC
  Administered 2022-11-07: 100 mg via ORAL
  Filled 2022-11-07: qty 1

## 2022-11-07 MED ORDER — LORAZEPAM 2 MG/ML IJ SOLN
0.5000 mg | Freq: Once | INTRAMUSCULAR | Status: AC
  Administered 2022-11-07: 0.5 mg via INTRAVENOUS
  Filled 2022-11-07: qty 1

## 2022-11-07 NOTE — ED Notes (Addendum)
Pt left d/t wait time

## 2022-11-07 NOTE — ED Notes (Signed)
This person left and then returned before being taken out of system.

## 2022-11-07 NOTE — ED Provider Notes (Signed)
[place] EMERGENCY DEPARTMENT AT [facility] Provider Note   CSN: [id number] Arrival date & time: 11/06/22  2253     History  Chief Complaint  Patient presents with   Chest Pain    Stacey Fletcher is a 72 y.o. female presenting to the ED with right-sided chest pain.  Patient reports onset yesterday.  She reports a pressure sensation in her chest.  She says she has had congestion and some coughing as well.  She came early to the emergency department in the morning, left after several hours of waiting in the waiting room, and then returned in the afternoon.  Per triage note, the patient was never removed from the waiting board after she left without being seen.  She returns reporting that her chest pain has improved now 5 out of 10 intensity but feels like pressure.  She denies lightheadedness.  Denies fevers or chills.  Reports she still has a cough  HPI     Home Medications Prior to Admission medications   Medication Sig Start Date End Date Taking? Authorizing Provider  aspirin 81 MG chewable tablet Chew 81 mg by mouth in the morning.    [provider]  buPROPion (WELLBUTRIN XL) 300 MG 24 hr tablet Take 300 mg by mouth every morning. 01/25/20   [provider]  cetirizine (ZYRTEC) 10 MG tablet Take 10 mg by mouth daily as needed for allergies.    [provider]  dapagliflozin propanediol (FARXIGA) 10 MG TABS tablet Take 1 tablet (10 mg total) by mouth daily. 09/04/21   Sharlene Dory, PA-C  digoxin (LANOXIN) 0.125 MG tablet Take 1 tablet (0.125 mg total) by mouth daily. 07/13/22   Sabharwal, Aditya, DO  diphenhydrAMINE (BENADRYL) 25 MG tablet Take 25 mg by mouth at bedtime.    [provider]  fluticasone (FLONASE) 50 MCG/ACT nasal spray Place 1 spray into both nostrils daily as needed for allergies or rhinitis.    [provider]  furosemide (LASIX) 40 MG tablet Take 1 tablet (40 mg total) by mouth every other day. 09/04/21    Sharlene Dory, PA-C  gabapentin (NEURONTIN) 100 MG capsule TAKE 1 CAPSULE(100 MG) BY MOUTH AT BEDTIME 09/11/21   Kerrin Champagne, MD  levothyroxine (SYNTHROID) 25 MCG tablet Take 25 mcg by mouth daily before breakfast. 12/26/19   [provider]  losartan (COZAAR) 100 MG tablet Take 1 tablet (100 mg total) by mouth daily. 06/07/22   Sabharwal, Aditya, DO  losartan (COZAAR) 50 MG tablet Take 1 tablet (50 mg total) by mouth daily. 06/07/22 09/05/22  Sabharwal, Aditya, DO  Menthol-Methyl Salicylate (MUSCLE RUB) 10-15 % CREA Apply 1 Application topically 2 (two) times a week.    [provider]  metFORMIN (GLUCOPHAGE) 1000 MG tablet Take 1,000 mg by mouth daily. 11/10/21   [provider]  metoprolol succinate (TOPROL-XL) 50 MG 24 hr tablet Take 3 tablets (150 mg total) by mouth daily. Take with or immediately following a meal. 09/28/22   Sabharwal, Aditya, DO  Potassium Chloride ER 20 MEQ TBCR Take 1 tablet (20 mEq total) by mouth every other day. 08/10/22   Sabharwal, Aditya, DO  pravastatin (PRAVACHOL) 20 MG tablet Take 20 mg by mouth in the morning. 11/05/18   [provider]  spironolactone (ALDACTONE) 25 MG tablet Take 1 tablet (25 mg total) by mouth daily. 09/04/21   Sharlene Dory, PA-C  venlafaxine XR (EFFEXOR-XR) 75 MG 24 hr capsule Take 75 mg by mouth  in the morning, at noon, and at bedtime. 02/25/20   [provider]      Allergies    Entresto [sacubitril-valsartan], Prednisone, Ace inhibitors, Empagliflozin, Sitagliptin, and Procardia [nifedipine]    Review of Systems   Review of Systems  Physical Exam Updated Vital Signs BP 122/80 (BP Location: Left Arm)   Pulse 96   Temp 98.5 F (36.9 C)   Resp 16   Ht 5\' 9"  (1.753 m)   Wt 92.1 kg   SpO2 98%   BMI 29.98 kg/m  Physical Exam Constitutional:      General: She is not in acute distress. HENT:     Head: Normocephalic and atraumatic.  Eyes:     Conjunctiva/sclera: Conjunctivae normal.      Pupils: Pupils are equal, round, and reactive to light.  Cardiovascular:     Rate and Rhythm: Normal rate and regular rhythm.  Pulmonary:     Effort: Pulmonary effort is normal. No respiratory distress.  Abdominal:     General: There is no distension.     Tenderness: There is no abdominal tenderness.  Skin:    General: Skin is warm and dry.  Neurological:     General: No focal deficit present.     Mental Status: She is alert. Mental status is at baseline.  Psychiatric:        Mood and Affect: Mood normal.        Behavior: Behavior normal.     ED Results / Procedures / Treatments   Labs (all labs ordered are listed, but only abnormal results are displayed) Labs Reviewed  BASIC METABOLIC PANEL - Abnormal; Notable for the following components:      Result Value   Glucose, Bld 120 (*)    Creatinine, Ser 1.03 (*)    GFR, Estimated 57 (*)    All other components within normal limits  CBC - Abnormal; Notable for the following components:   RBC 5.25 (*)    All other components within normal limits  TROPONIN I (HIGH SENSITIVITY) - Abnormal; Notable for the following components:   Troponin I (High Sensitivity) 19 (*)    All other components within normal limits  TROPONIN I (HIGH SENSITIVITY)    EKG EKG Interpretation Date/Time:  Tuesday November 06 2022 23:38:52 EDT Ventricular Rate:  94 PR Interval:  164 QRS Duration:  88 QT Interval:  348 QTC Calculation: 435 R Axis:   26  Text Interpretation: Normal sinus rhythm Nonspecific T wave abnormality Abnormal ECG When compared with ECG of 13-Jul-2022 10:38, PREVIOUS ECG IS PRESENT Confirmed by Ross Marcus ([id number]) on 11/07/2022 5:31:37 AM  Radiology DG Chest 2 View  Result Date: 11/07/2022 CLINICAL DATA:  Chest pain for 1 day EXAM: CHEST - 2 VIEW COMPARISON:  07/20/2021 FINDINGS: Cardiac shadow is within normal limits. Lungs are well aerated bilaterally. Somewhat rounded soft tissue density is suggested in the right apex. This  is not well visualized on the lateral projection. No focal infiltrate is seen. No bony abnormality is noted. IMPRESSION: Rounded density suggested in the right apex. Noncontrast CT would be helpful for further evaluation. Electronically Signed   By: Alcide Clever M.D.   On: 11/07/2022 00:12    Procedures Procedures    Medications Ordered in ED Medications  cefTRIAXone (ROCEPHIN) 1 g in sodium chloride 0.9 % 100 mL IVPB (1 g Intravenous New Bag/Given 11/07/22 2145)  doxycycline (VIBRA-TABS) tablet 100 mg (100 mg Oral Given 11/07/22 2146)  LORazepam (ATIVAN) injection 0.5 mg (0.5  mg Intravenous Given 11/07/22 2146)    ED Course/ Medical Decision Making/ A&P                                 Medical Decision Making Amount and/or Complexity of Data Reviewed Labs: ordered. Radiology: ordered.  Risk Prescription drug management.   This patient presents to the ED with concern for right-sided chest pain and pressure. This involves an extensive number of treatment options, and is a complaint that carries with it a high risk of complications and morbidity.  The differential diagnosis includes pneumonia with pneumothorax versus ACS versus other  Co-morbidities that complicate the patient evaluation: History of cardiovascular disease  External records from outside source obtained and reviewed including cardiology heart care office record from June 2024 noting the patient has a history of hypertension, type 2 diabetes, PTSD, and heart failure with an EF of 30 to 35%, but "no CAD" noted on her last LHC in June 2023  I ordered and personally interpreted labs.  The pertinent results include: Delta troponin is negative.  No emergent findings  I ordered imaging studies including x-ray of the chest I independently visualized and interpreted imaging which showed potential opacity in the right upper lobe I agree with the radiologist interpretation  Per my interpretation the patient's ECG shows no acute  ischemic findings  I ordered medication including IV Rocephin and doxycycline for suspected pneumonia.  IV Ativan for anxiety.  Patient is tearful on exam and reports difficulty sleeping, says she is stressed about her chest pain.  I have reviewed the patients home medicines and have made adjustments as needed  Test Considered: Very low suspicion for acute PE clinically.  No indication for CT angiogram   After the interventions noted above, I reevaluated the patient and found that they have: improved   Dispostion:  After consideration of the diagnostic results and the patients response to treatment, I feel that the patent would benefit from outpatient follow-up with cardiology.  Her chest pain may may be multifactorial, related to both anxiety, as well as potential respiratory infection.  We also discussed the possibility of chest pain related to her heart failure, but I suspect this to be nonischemic cardiomyopathy.  With stable vital signs, no evidence of pulmonary edema, negative and flat troponins, I do not see indication for hospitalization or diuresis at this time.  She can follow-up with her cardiologist.  The patient is comfortable with this plan.  I did feel it was reasonable and prudent to initiate antibiotics for CAP as the patient has reported productive cough for 2 days and has potential opacity in the right upper lobe.  I do not see evidence of sepsis.         Final Clinical Impression(s) / ED Diagnoses Final diagnoses:  Pneumonia of right upper lobe due to infectious organism  Chest pain, unspecified type    Rx / DC Orders ED Discharge Orders     None         Terald Sleeper, MD 11/07/22 2206

## 2022-11-07 NOTE — Discharge Instructions (Addendum)
Your x-ray showed that you may possibly have a pneumonia on the right lung.  We treated you with antibiotics in the ER, and he will need to continue 6 more days of antibiotics at home beginning tomorrow.  We discussed needing to follow-up also with her cardiologist with your heart history.  Please contact their office tomorrow as well.

## 2022-11-09 ENCOUNTER — Encounter (HOSPITAL_COMMUNITY): Payer: 59

## 2022-11-09 ENCOUNTER — Other Ambulatory Visit: Payer: Self-pay | Admitting: Family Medicine

## 2022-11-09 DIAGNOSIS — Z1212 Encounter for screening for malignant neoplasm of rectum: Secondary | ICD-10-CM

## 2022-11-09 DIAGNOSIS — Z1211 Encounter for screening for malignant neoplasm of colon: Secondary | ICD-10-CM

## 2022-11-12 ENCOUNTER — Encounter (HOSPITAL_COMMUNITY): Payer: 59

## 2022-11-12 ENCOUNTER — Telehealth (HOSPITAL_COMMUNITY): Payer: Self-pay

## 2022-11-12 NOTE — Telephone Encounter (Signed)
Pt called and stated she will not be able to attend CR today due to having a cold.

## 2022-11-13 ENCOUNTER — Ambulatory Visit: Payer: 59

## 2022-11-13 DIAGNOSIS — Z0000 Encounter for general adult medical examination without abnormal findings: Secondary | ICD-10-CM | POA: Diagnosis not present

## 2022-11-13 NOTE — Progress Notes (Signed)
Subjective:   [name] is a 72 y.o. female who presents for Medicare Annual (Subsequent) preventive examination.  Visit Complete: Virtual I connected with  Stacey Fletcher on 11/13/22 by a audio enabled telemedicine application and verified that I am speaking with the correct person using two identifiers.  Patient Location: Home  Provider Location: Office/Clinic  I discussed the limitations of evaluation and management by telemedicine. The patient expressed understanding and agreed to proceed.  Vital Signs: Because this visit was a virtual/telehealth visit, some criteria may be missing or patient reported. Any vitals not documented were not able to be obtained and vitals that have been documented are patient reported.  Patient Medicare AWV questionnaire was completed by the patient on 11/12/2022; I have confirmed that all information answered by patient is correct and no changes since this date.  Cardiac Risk Factors include: advanced age (>92men, >84 women);diabetes mellitus;hypertension     Objective:    Today's Vitals   11/13/22 1428  PainSc: 1    There is no height or weight on file to calculate BMI.     11/13/2022    2:38 PM 02/13/2022   12:09 PM 12/14/2021    8:27 AM 07/20/2021   12:13 AM 05/02/2020   10:24 AM 11/11/2018    5:04 PM 09/15/2017    4:30 PM  Advanced Directives  Does Patient Have a Medical Advance Directive? Yes No Yes No No No No  Type of Advance Directive Living will;Healthcare Power of [employer] Power of [place];Living will      Copy of Healthcare Power of Attorney in Chart? No - copy requested  No - copy requested      Would patient like information on creating a medical advance directive?  No - Patient declined   Yes (MAU/Ambulatory/Procedural Areas - Information given) No - Patient declined No - Patient declined    Current Medications (verified) Outpatient Encounter Medications as of 11/13/2022  Medication Sig   aspirin 81 MG  chewable tablet Chew 81 mg by mouth in the morning.   buPROPion (WELLBUTRIN XL) 300 MG 24 hr tablet Take 300 mg by mouth every morning.   cetirizine (ZYRTEC) 10 MG tablet Take 10 mg by mouth daily as needed for allergies.   dapagliflozin propanediol (FARXIGA) 10 MG TABS tablet Take 1 tablet (10 mg total) by mouth daily.   digoxin (LANOXIN) 0.125 MG tablet Take 1 tablet (0.125 mg total) by mouth daily.   diphenhydrAMINE (BENADRYL) 25 MG tablet Take 25 mg by mouth at bedtime.   doxycycline (VIBRAMYCIN) 100 MG capsule Take 1 capsule (100 mg total) by mouth 2 (two) times daily for 6 days.   fluticasone (FLONASE) 50 MCG/ACT nasal spray Place 1 spray into both nostrils daily as needed for allergies or rhinitis.   furosemide (LASIX) 40 MG tablet Take 1 tablet (40 mg total) by mouth every other day.   levothyroxine (SYNTHROID) 25 MCG tablet Take 25 mcg by mouth daily before breakfast.   losartan (COZAAR) 100 MG tablet Take 1 tablet (100 mg total) by mouth daily.   metFORMIN (GLUCOPHAGE) 1000 MG tablet Take 1,000 mg by mouth daily.   metoprolol succinate (TOPROL-XL) 50 MG 24 hr tablet Take 3 tablets (150 mg total) by mouth daily. Take with or immediately following a meal.   Potassium Chloride ER 20 MEQ TBCR Take 1 tablet (20 mEq total) by mouth every other day.   pravastatin (PRAVACHOL) 20 MG tablet Take 20 mg by mouth in the morning.  spironolactone (ALDACTONE) 25 MG tablet Take 1 tablet (25 mg total) by mouth daily.   venlafaxine XR (EFFEXOR-XR) 75 MG 24 hr capsule Take 75 mg by mouth in the morning, at noon, and at bedtime.   gabapentin (NEURONTIN) 100 MG capsule TAKE 1 CAPSULE(100 MG) BY MOUTH AT BEDTIME   losartan (COZAAR) 50 MG tablet Take 1 tablet (50 mg total) by mouth daily.   Menthol-Methyl Salicylate (MUSCLE RUB) 10-15 % CREA Apply 1 Application topically 2 (two) times a week.   No facility-administered encounter medications on file as of 11/13/2022.    Allergies (verified) Entresto  [sacubitril-valsartan], Prednisone, Ace inhibitors, Empagliflozin, Sitagliptin, and Procardia [nifedipine]   History: Past Medical History:  Diagnosis Date   Coronary artery disease    Depression    Diabetes mellitus    Fibromyalgia    Goiter    Hypertension    MI (myocardial infarction) (HCC)    Osteoarthritis    Pneumonia    10+ years ago.   Past Surgical History:  Procedure Laterality Date   ABDOMINAL HYSTERECTOMY     BREAST SURGERY     BUBBLE STUDY  12/14/2021   Procedure: BUBBLE STUDY;  Surgeon: Christell Constant, MD;  Location: [facility] ENDOSCOPY;  Service: Cardiovascular;;   CARDIAC CATHETERIZATION     1995   REDUCTION MAMMAPLASTY Bilateral    RIGHT/LEFT HEART CATH AND CORONARY ANGIOGRAPHY N/A 07/21/2021   Procedure: RIGHT/LEFT HEART CATH AND CORONARY ANGIOGRAPHY;  Surgeon: Runell Gess, MD;  Location: [facility] INVASIVE CV LAB;  Service: Cardiovascular;  Laterality: N/A;   TEE WITHOUT CARDIOVERSION N/A 12/14/2021   Procedure: TRANSESOPHAGEAL ECHOCARDIOGRAM (TEE);  Surgeon: Christell Constant, MD;  Location: [facility]COPY;  Service: Cardiovascular;  Laterality: N/A;   Family History  Problem Relation Age of Onset   Hypertension Mother    Diabetes Mother    Stroke Mother    Hypertension Other    Diabetes Other    Social History   Socioeconomic History   Marital status: Divorced    Spouse name: Not on file   Number of children: 3   Years of education: 16   Highest education level: Bachelor's degree (e.g., BA, AB, BS)  Occupational History   Occupation: retired  Tobacco Use   Smoking status: Former    Current packs/day: 0.00    Types: Cigarettes    Quit date: 07/07/2018    Years since quitting: 4.3    Passive exposure: Never   Smokeless tobacco: Never  Vaping Use   Vaping status: Never Used  Substance and Sexual Activity   Alcohol use: No   Drug use: No   Sexual activity: Not Currently  Other Topics Concern   Not on file  Social History Narrative    Not on file   Social Determinants of Health   Financial Resource Strain: Low Risk  (11/12/2022)   Overall Financial Resource Strain (CARDIA)    Difficulty of Paying Living Expenses: Not very hard  Food Insecurity: No Food Insecurity (11/12/2022)   Hunger Vital Sign    Worried About Running Out of Food in the Last Year: Never true    Ran Out of Food in the Last Year: Never true  Transportation Needs: No Transportation Needs (11/12/2022)   PRAPARE - Administrator, Civil Service (Medical): No    Lack of Transportation (Non-Medical): No  Physical Activity: Sufficiently Active (11/12/2022)   Exercise Vital Sign    Days of Exercise per Week: 3 days    Minutes of  Exercise per Session: 140 min  Stress: Stress Concern Present (11/12/2022)   [employer] of Occupational Health - Occupational Stress Questionnaire    Feeling of Stress : To some extent  Social Connections: Unknown (11/12/2022)   Social Connection and Isolation Panel [NHANES]    Frequency of Communication with Friends and Family: Three times a week    Frequency of Social Gatherings with Friends and Family: Once a week    Attends Religious Services: Not on Marketing executive or Organizations: Yes    Attends Engineer, structural: More than 4 times per year    Marital Status: Divorced    Tobacco Counseling Counseling given: Not Answered   Clinical Intake:  Pre-visit preparation completed: Yes  Pain : 0-10 Pain Score: 1  Pain Location: Generalized Pain Descriptors / Indicators: Aching Pain Onset: More than a month ago Pain Frequency: Constant     Nutritional Risks: None Diabetes: Yes CBG done?: No Did pt. bring in CBG monitor from home?: No  How often do you need to have someone help you when you read instructions, pamphlets, or other written materials from your doctor or pharmacy?: 1 - Never  Interpreter Needed?: No  Information entered by :: NAllen LPN   Activities of Daily  Living    11/12/2022    3:58 PM  In your present state of health, do you have any difficulty performing the following activities:  Hearing? 0  Vision? 0  Difficulty concentrating or making decisions? 0  Walking or climbing stairs? 0  Dressing or bathing? 0  Doing errands, shopping? 0  Preparing Food and eating ? N  Using the Toilet? N  In the past six months, have you accidently leaked urine? N  Do you have problems with loss of bowel control? N  Managing your Medications? N  Managing your Finances? N  Housekeeping or managing your Housekeeping? N    Patient Care Team: Garnette Gunner, MD as PCP - General (Family Medicine) Corky Crafts, MD as PCP - Cardiology (Cardiology)  Indicate any recent Medical Services you may have received from other than [facility] providers in the past year (date may be approximate).     Assessment:   This is a routine wellness examination for [facility]  Hearing/Vision screen Hearing Screening - Comments:: Denies hearing issues Vision Screening - Comments:: Regular eye exams, [facility]   Goals Addressed             This Visit's Progress    Patient Stated       11/13/2022, wants to keep heart condition under control       Depression Screen    11/13/2022    2:40 PM 09/07/2022    1:51 PM 08/29/2022    5:05 PM  PHQ 2/9 Scores  PHQ - 2 Score 0 4 2  PHQ- 9 Score  11 6    Fall Risk    11/12/2022    3:58 PM 11/07/2022    3:29 PM 11/02/2022    2:02 PM 10/31/2022    3:02 PM 10/26/2022    2:01 PM  Fall Risk   Falls in the past year? 1 0 0 0 0  Comment passed out      Number falls in past yr: 0 0 0 0 0  Injury with Fall? 0 0 0 0 0  Risk for fall due to : Medication side effect No Fall Risks No Fall Risks No Fall Risks No Fall Risks  Follow up Falls prevention discussed;Falls evaluation completed Falls evaluation completed Falls evaluation completed Falls evaluation completed Falls evaluation completed    MEDICARE RISK AT  HOME: Medicare Risk at Home Any stairs in or around the home?: Yes If so, are there any without handrails?: Yes Home free of loose throw rugs in walkways, pet beds, electrical cords, etc?: Yes Adequate lighting in your home to reduce risk of falls?: Yes Life alert?: No Use of a cane, walker or w/c?: No Grab bars in the bathroom?: No Shower chair or bench in shower?: No Elevated toilet seat or a handicapped toilet?: No  TIMED UP AND GO:  Was the test performed?  No    Cognitive Function:        11/13/2022    2:40 PM  6CIT Screen  What Year? 0 points  What month? 0 points  What time? 0 points  Count back from 20 0 points  Months in reverse 4 points  Repeat phrase 4 points  Total Score 8 points    Immunizations Immunization History  Administered Date(s) Administered   Influenza Split 12/02/2018   Influenza, High Dose Seasonal PF 12/01/2018   Influenza-Unspecified 02/17/2020, 04/18/2021, 12/23/2021   PFIZER(Purple Top)SARS-COV-2 Vaccination 04/12/2019, 05/12/2019, 11/27/2019   Pneumococcal Conjugate-13 12/25/2016, 12/02/2018   Td 07/13/2018   Tdap 07/13/2018   Unspecified SARS-COV-2 Vaccination 12/31/2020    TDAP status: Up to date  Flu Vaccine status: Up to date  Pneumococcal vaccine status: Up to date  Covid-19 vaccine status: Information provided on how to obtain vaccines.   Qualifies for Shingles Vaccine? Yes   Zostavax completed No   Shingrix Completed?: No.    Education has been provided regarding the importance of this vaccine. Patient has been advised to call insurance company to determine out of pocket expense if they have not yet received this vaccine. Advised may also receive vaccine at local pharmacy or Health Dept. Verbalized acceptance and understanding.  Screening Tests Health Maintenance  Topic Date Due   FOOT EXAM  Never done   OPHTHALMOLOGY EXAM  Never done   Diabetic kidney evaluation - Urine ACR  Never done   Fecal DNA (Cologuard)  Never  done   Zoster Vaccines- Shingrix (1 of 2) Never done   Pneumonia Vaccine 52+ Years old (2 of 2 - PPSV23 or PCV20) 12/02/2019   INFLUENZA VACCINE  09/06/2022   COVID-19 Vaccine (5 - 2023-24 season) 10/07/2022   HEMOGLOBIN A1C  03/10/2023   Diabetic kidney evaluation - eGFR measurement  11/06/2023   Medicare Annual Wellness (AWV)  11/13/2023   MAMMOGRAM  05/22/2024   DTaP/Tdap/Td (3 - Td or Tdap) 07/12/2028   DEXA SCAN  Completed   Hepatitis C Screening  Completed   HPV VACCINES  Aged Out    Health Maintenance  Health Maintenance Due  Topic Date Due   FOOT EXAM  Never done   OPHTHALMOLOGY EXAM  Never done   Diabetic kidney evaluation - Urine ACR  Never done   Fecal DNA (Cologuard)  Never done   Zoster Vaccines- Shingrix (1 of 2) Never done   Pneumonia Vaccine 56+ Years old (2 of 2 - PPSV23 or PCV20) 12/02/2019   INFLUENZA VACCINE  09/06/2022   COVID-19 Vaccine (5 - 2023-24 season) 10/07/2022    Colorectal cancer screening: awaiting cologuard kit   Mammogram status: Completed 05/23/2022. Repeat every year  Bone Density status: Completed 07/09/2017.   Lung Cancer Screening: (Low Dose CT Chest recommended if Age 70-80 years, 20 pack-year currently  smoking OR have quit w/in 15years.) does not qualify.   Lung Cancer Screening Referral: no  Additional Screening:  Hepatitis C Screening: does qualify; Completed 05/07/2013  Vision Screening: Recommended annual ophthalmology exams for early detection of glaucoma and other disorders of the eye. Is the patient up to date with their annual eye exam?  No  Who is the provider or what is the name of the office in which the patient attends annual eye exams? [facility] If pt is not established with a provider, would they like to be referred to a provider to establish care? No .   Dental Screening: Recommended annual dental exams for proper oral hygiene  Diabetic Foot Exam: Diabetic Foot Exam: Overdue, Pt has been advised about the  importance in completing this exam. Pt is scheduled for diabetic foot exam on next appointment.  Community Resource Referral / Chronic Care Management: CRR required this visit?  No   CCM required this visit?  No     Plan:     I have personally reviewed and noted the following in the patient's chart:   Medical and social history Use of alcohol, tobacco or illicit drugs  Current medications and supplements including opioid prescriptions. Patient is not currently taking opioid prescriptions. Functional ability and status Nutritional status Physical activity Advanced directives List of other physicians Hospitalizations, surgeries, and ER visits in previous 12 months Vitals Screenings to include cognitive, depression, and falls Referrals and appointments  In addition, I have reviewed and discussed with patient certain preventive protocols, quality metrics, and best practice recommendations. A written personalized care plan for preventive services as well as general preventive health recommendations were provided to patient.     Barb Merino, LPN   16/02/958   After Visit Summary: (MyChart) Due to this being a telephonic visit, the after visit summary with patients personalized plan was offered to patient via MyChart   Nurse Notes: none

## 2022-11-13 NOTE — Patient Instructions (Signed)
Stacey Fletcher , Thank you for taking time to come for your Medicare Wellness Visit. I appreciate your ongoing commitment to your health goals. Please review the following plan we discussed and let me know if I can assist you in the future.   Referrals/Orders/Follow-Ups/Clinician Recommendations: none  This is a list of the screening recommended for you and due dates:  Health Maintenance  Topic Date Due   Complete foot exam   Never done   Eye exam for diabetics  Never done   Yearly kidney health urinalysis for diabetes  Never done   Cologuard (Stool DNA test)  Never done   Zoster (Shingles) Vaccine (1 of 2) Never done   Pneumonia Vaccine (2 of 2 - PPSV23 or PCV20) 12/02/2019   Flu Shot  09/06/2022   COVID-19 Vaccine (5 - 2023-24 season) 10/07/2022   Hemoglobin A1C  03/10/2023   Yearly kidney function blood test for diabetes  11/06/2023   Medicare Annual Wellness Visit  11/13/2023   Mammogram  05/22/2024   DTaP/Tdap/Td vaccine (3 - Td or Tdap) 07/12/2028   DEXA scan (bone density measurement)  Completed   Hepatitis C Screening  Completed   HPV Vaccine  Aged Out    Advanced directives: (Copy Requested) Please bring a copy of your health care power of attorney and living will to the office to be added to your chart at your convenience.  Next Medicare Annual Wellness Visit scheduled for next year: Yes  Insert Preventive Care attachment Insert FALL PREVENTION attachment if needed

## 2022-11-14 ENCOUNTER — Encounter (HOSPITAL_COMMUNITY)
Admission: RE | Admit: 2022-11-14 | Discharge: 2022-11-14 | Disposition: A | Discharge status: Home / Self Care | Payer: 59 | Source: Ambulatory Visit | Attending: Cardiology | Admitting: Cardiology

## 2022-11-14 VITALS — Ht 68.75 in | Wt 204.6 lb

## 2022-11-14 DIAGNOSIS — I5022 Chronic systolic (congestive) heart failure: Secondary | ICD-10-CM | POA: Insufficient documentation

## 2022-11-14 DIAGNOSIS — Z5189 Encounter for other specified aftercare: Secondary | ICD-10-CM | POA: Diagnosis not present

## 2022-11-16 ENCOUNTER — Encounter (HOSPITAL_COMMUNITY)
Admission: RE | Admit: 2022-11-16 | Discharge: 2022-11-16 | Disposition: A | Discharge status: Home / Self Care | Payer: 59 | Source: Ambulatory Visit | Attending: Cardiology | Admitting: Cardiology

## 2022-11-16 DIAGNOSIS — I5022 Chronic systolic (congestive) heart failure: Secondary | ICD-10-CM

## 2022-11-16 LAB — GLUCOSE, CAPILLARY
Glucose-Capillary: 76 mg/dL (ref 70–99)
Glucose-Capillary: 83 mg/dL (ref 70–99)

## 2022-11-16 NOTE — Progress Notes (Signed)
Incomplete Session Note  Patient Details  Name: Stacey Fletcher MRN: [id number] Date of Birth: [birth year] Referring Provider:   Flowsheet Row INTENSIVE CARDIAC REHAB ORIENT from 08/28/2022 in [facility]  Referring Provider Roper, Eliezer Lofts, DO       [name] did not complete her rehab session.  Veneta said she woke up late and did not get a chance to eat today. Patient given graham crackers, peanut butter and water. CBG 76. Asymptomatic. Given orange juice. No exercise today per protocol. Reminded Ivi to please eat something before to exercise.  Patient states understanding.Repeat CBG 83. Thayer Headings RN BSN

## 2022-11-19 ENCOUNTER — Encounter (HOSPITAL_COMMUNITY): Payer: 59

## 2022-11-20 ENCOUNTER — Ambulatory Visit: Payer: 59 | Admitting: Family Medicine

## 2022-11-20 ENCOUNTER — Encounter: Payer: Self-pay | Admitting: Family Medicine

## 2022-11-20 VITALS — BP 126/86 | HR 74 | Temp 97.7°F | Wt 198.2 lb

## 2022-11-20 DIAGNOSIS — J3089 Other allergic rhinitis: Secondary | ICD-10-CM

## 2022-11-20 DIAGNOSIS — E1122 Type 2 diabetes mellitus with diabetic chronic kidney disease: Secondary | ICD-10-CM

## 2022-11-20 DIAGNOSIS — J189 Pneumonia, unspecified organism: Secondary | ICD-10-CM | POA: Diagnosis not present

## 2022-11-20 DIAGNOSIS — N179 Acute kidney failure, unspecified: Secondary | ICD-10-CM

## 2022-11-20 DIAGNOSIS — J309 Allergic rhinitis, unspecified: Secondary | ICD-10-CM | POA: Insufficient documentation

## 2022-11-20 DIAGNOSIS — E038 Other specified hypothyroidism: Secondary | ICD-10-CM

## 2022-11-20 DIAGNOSIS — I5042 Chronic combined systolic (congestive) and diastolic (congestive) heart failure: Secondary | ICD-10-CM | POA: Diagnosis not present

## 2022-11-20 DIAGNOSIS — Z23 Encounter for immunization: Secondary | ICD-10-CM

## 2022-11-20 DIAGNOSIS — N1831 Chronic kidney disease, stage 3a: Secondary | ICD-10-CM

## 2022-11-20 MED ORDER — AZELASTINE-FLUTICASONE 137-50 MCG/ACT NA SUSP
1.0000 | Freq: Two times a day (BID) | NASAL | 11 refills | Status: DC
Start: 2022-11-20 — End: 2023-04-02

## 2022-11-20 MED ORDER — FUROSEMIDE 40 MG PO TABS
40.0000 mg | ORAL_TABLET | ORAL | 0 refills | Status: DC
Start: 2022-11-20 — End: 2023-02-21

## 2022-11-20 NOTE — Progress Notes (Unsigned)
Assessment/Plan:   Problem List Items Addressed This Visit   None Visit Diagnoses     Immunization due    -  Primary       There are no discontinued medications.  No follow-ups on file.    Subjective:   Encounter date: 11/20/2022  Stacey Fletcher is a 72 y.o. female who has Chest pain; Nausea vomiting and diarrhea; Fever; HTN (hypertension); Hypothyroidism; Diabetes mellitus (HCC); ARF (acute renal failure) (HCC); Pneumonia; Osteoarthritis; MI (myocardial infarction) (HCC); Hypertension; Goiter; Fibromyalgia; Depression; Coronary artery disease; Arthritis of carpometacarpal (CMC) joint of right thumb; Pain of right hand; Acute respiratory failure with hypoxia (HCC); Elevated troponin; Acute CHF (congestive heart failure) (HCC); Severe mitral regurgitation; and Chronic combined systolic and diastolic heart failure (HCC) on their problem list..   She  has a past medical history of Coronary artery disease, Depression, Diabetes mellitus, Fibromyalgia, Goiter, Hypertension, MI (myocardial infarction) (HCC), Osteoarthritis, and Pneumonia..   She presents with chief complaint of Medical Management of Chronic Issues (Rx refill furosemide 40mg ) .   HPI:  Depression/Anxiety, established problem, ***Stable Current Medications: *** Side Effects: *** Current Symptoms/Interim History: ***  @    11/13/2022    2:40 PM 09/07/2022    1:51 PM 08/29/2022    5:05 PM  Depression screen PHQ 2/9  Decreased Interest 0 1 1  Down, Depressed, Hopeless 0 3 1  PHQ - 2 Score 0 4 2  Altered sleeping  3 2  Tired, decreased energy  1 1  Change in appetite  3 1  Feeling bad or failure about yourself   0 0  Trouble concentrating  0 0  Moving slowly or fidgety/restless  0 0  Suicidal thoughts  0 0  PHQ-9 Score  11 6  Difficult doing work/chores  Somewhat difficult Not difficult at all       09/07/2022    1:51 PM  GAD 7 : Generalized Anxiety Score  Nervous, Anxious, on Edge 1  Control/stop  worrying 3  Worry too much - different things 3  Trouble relaxing 3  Restless 0  Easily annoyed or irritable 3  Afraid - awful might happen 3  Total GAD 7 Score 16  Anxiety Difficulty Somewhat difficult    ROS: No SI or HI.  ROS  Past Surgical History:  Procedure Laterality Date   ABDOMINAL HYSTERECTOMY     BREAST SURGERY     BUBBLE STUDY  12/14/2021   Procedure: BUBBLE STUDY;  Surgeon: Christell Constant, MD;  Location: [facility] ENDOSCOPY;  Service: Cardiovascular;;   CARDIAC CATHETERIZATION     1995   REDUCTION MAMMAPLASTY Bilateral    RIGHT/LEFT HEART CATH AND CORONARY ANGIOGRAPHY N/A 07/21/2021   Procedure: RIGHT/LEFT HEART CATH AND CORONARY ANGIOGRAPHY;  Surgeon: Runell Gess, MD;  Location: [facility] INVASIVE CV LAB;  Service: Cardiovascular;  Laterality: N/A;   TEE WITHOUT CARDIOVERSION N/A 12/14/2021   Procedure: TRANSESOPHAGEAL ECHOCARDIOGRAM (TEE);  Surgeon: Christell Constant, MD;  Location: [facility] ENDOSCOPY;  Service: Cardiovascular;  Laterality: N/A;    Outpatient Medications Prior to Visit  Medication Sig Dispense Refill   aspirin 81 MG chewable tablet Chew 81 mg by mouth in the morning.     buPROPion (WELLBUTRIN XL) 300 MG 24 hr tablet Take 300 mg by mouth every morning.     dapagliflozin propanediol (FARXIGA) 10 MG TABS tablet Take 1 tablet (10 mg total) by mouth daily. 90 tablet 3   digoxin (LANOXIN) 0.125 MG tablet Take 1 tablet (0.125 mg  total) by mouth daily. 90 tablet 3   diphenhydrAMINE (BENADRYL) 25 MG tablet Take 25 mg by mouth at bedtime.     fluticasone (FLONASE) 50 MCG/ACT nasal spray Place 1 spray into both nostrils daily as needed for allergies or rhinitis.     furosemide (LASIX) 40 MG tablet Take 1 tablet (40 mg total) by mouth every other day. 45 tablet 3   levothyroxine (SYNTHROID) 25 MCG tablet Take 25 mcg by mouth daily before breakfast.     losartan (COZAAR) 100 MG tablet Take 1 tablet (100 mg total) by mouth daily. 90 tablet 3   metFORMIN  (GLUCOPHAGE) 1000 MG tablet Take 1,000 mg by mouth daily.     metoprolol succinate (TOPROL-XL) 50 MG 24 hr tablet Take 3 tablets (150 mg total) by mouth daily. Take with or immediately following a meal. 90 tablet 3   Potassium Chloride ER 20 MEQ TBCR Take 1 tablet (20 mEq total) by mouth every other day. 45 tablet 3   pravastatin (PRAVACHOL) 20 MG tablet Take 20 mg by mouth in the morning.     spironolactone (ALDACTONE) 25 MG tablet Take 1 tablet (25 mg total) by mouth daily. 90 tablet 3   venlafaxine XR (EFFEXOR-XR) 75 MG 24 hr capsule Take 75 mg by mouth in the morning, at noon, and at bedtime.     cetirizine (ZYRTEC) 10 MG tablet Take 10 mg by mouth daily as needed for allergies. (Patient not taking: Reported on 11/20/2022)     gabapentin (NEURONTIN) 100 MG capsule TAKE 1 CAPSULE(100 MG) BY MOUTH AT BEDTIME 30 capsule 6   losartan (COZAAR) 50 MG tablet Take 1 tablet (50 mg total) by mouth daily. 90 tablet 3   Menthol-Methyl Salicylate (MUSCLE RUB) 10-15 % CREA Apply 1 Application topically 2 (two) times a week.     No facility-administered medications prior to visit.    Family History  Problem Relation Age of Onset   Hypertension Mother    Diabetes Mother    Stroke Mother    Hypertension Other    Diabetes Other     Social History   Socioeconomic History   Marital status: Divorced    Spouse name: Not on file   Number of children: 3   Years of education: 16   Highest education level: Bachelor's degree (e.g., BA, AB, BS)  Occupational History   Occupation: retired  Tobacco Use   Smoking status: Former    Current packs/day: 0.00    Types: Cigarettes    Quit date: 07/07/2018    Years since quitting: 4.3    Passive exposure: Never   Smokeless tobacco: Never  Vaping Use   Vaping status: Never Used  Substance and Sexual Activity   Alcohol use: No   Drug use: No   Sexual activity: Not Currently  Other Topics Concern   Not on file  Social History Narrative   Not on file    Social Determinants of Health   Financial Resource Strain: Low Risk  (11/12/2022)   Overall Financial Resource Strain (CARDIA)    Difficulty of Paying Living Expenses: Not very hard  Food Insecurity: No Food Insecurity (11/12/2022)   Hunger Vital Sign    Worried About Running Out of Food in the Last Year: Never true    Ran Out of Food in the Last Year: Never true  Transportation Needs: No Transportation Needs (11/12/2022)   PRAPARE - Administrator, Civil Service (Medical): No    Lack of Transportation (  Non-Medical): No  Physical Activity: Sufficiently Active (11/12/2022)   Exercise Vital Sign    Days of Exercise per Week: 3 days    Minutes of Exercise per Session: 140 min  Stress: Stress Concern Present (11/12/2022)   [employer] of Occupational Health - Occupational Stress Questionnaire    Feeling of Stress : To some extent  Social Connections: Unknown (11/12/2022)   Social Connection and Isolation Panel [NHANES]    Frequency of Communication with Friends and Family: Three times a week    Frequency of Social Gatherings with Friends and Family: Once a week    Attends Religious Services: Not on Marketing executive or Organizations: Yes    Attends Banker Meetings: More than 4 times per year    Marital Status: Divorced  Intimate Partner Violence: Not At Risk (11/13/2022)   Humiliation, Afraid, Rape, and Kick questionnaire    Fear of Current or Ex-Partner: No    Emotionally Abused: No    Physically Abused: No    Sexually Abused: No                                                                                                  Objective:  Physical Exam: BP 126/86 (BP Location: Left Arm, Patient Position: Sitting, Cuff Size: Large)   Pulse 74   Temp 97.7 F (36.5 C) (Temporal)   Wt 198 lb 3.2 oz (89.9 kg)   SpO2 99%   BMI 29.48 kg/m     Physical Exam  DG Chest 2 View  Result Date: 11/07/2022 CLINICAL DATA:  Chest pain for 1 day  EXAM: CHEST - 2 VIEW COMPARISON:  07/20/2021 FINDINGS: Cardiac shadow is within normal limits. Lungs are well aerated bilaterally. Somewhat rounded soft tissue density is suggested in the right apex. This is not well visualized on the lateral projection. No focal infiltrate is seen. No bony abnormality is noted. IMPRESSION: Rounded density suggested in the right apex. Noncontrast CT would be helpful for further evaluation. Electronically Signed   By: Alcide Clever M.D.   On: 11/07/2022 00:12    Recent Results (from the past 2160 hour(s))  Glucose, capillary     Status: Abnormal   Collection Time: 08/28/22  2:02 PM  Result Value Ref Range   Glucose-Capillary 125 (H) 70 - 99 mg/dL    Comment: Glucose reference range applies only to samples taken after fasting for at least 8 hours.  Glucose, capillary     Status: Abnormal   Collection Time: 09/05/22  3:05 PM  Result Value Ref Range   Glucose-Capillary 144 (H) 70 - 99 mg/dL    Comment: Glucose reference range applies only to samples taken after fasting for at least 8 hours.  Glucose, capillary     Status: Abnormal   Collection Time: 09/05/22  3:59 PM  Result Value Ref Range   Glucose-Capillary 109 (H) 70 - 99 mg/dL    Comment: Glucose reference range applies only to samples taken after fasting for at least 8 hours.  POCT glycosylated hemoglobin (Hb A1C)  Status: Abnormal   Collection Time: 09/07/22  1:53 PM  Result Value Ref Range   Hemoglobin A1C 7.4 (A) 4.0 - 5.6 %   HbA1c POC (<> result, manual entry)     HbA1c, POC (prediabetic range)     HbA1c, POC (controlled diabetic range)    Glucose, capillary     Status: Abnormal   Collection Time: 09/14/22  3:13 PM  Result Value Ref Range   Glucose-Capillary 121 (H) 70 - 99 mg/dL    Comment: Glucose reference range applies only to samples taken after fasting for at least 8 hours.  Glucose, capillary     Status: Abnormal   Collection Time: 09/17/22  3:16 PM  Result Value Ref Range    Glucose-Capillary 119 (H) 70 - 99 mg/dL    Comment: Glucose reference range applies only to samples taken after fasting for at least 8 hours.  Glucose, capillary     Status: Abnormal   Collection Time: 09/17/22  4:18 PM  Result Value Ref Range   Glucose-Capillary 115 (H) 70 - 99 mg/dL    Comment: Glucose reference range applies only to samples taken after fasting for at least 8 hours.  Glucose, capillary     Status: Abnormal   Collection Time: 09/19/22  4:01 PM  Result Value Ref Range   Glucose-Capillary 142 (H) 70 - 99 mg/dL    Comment: Glucose reference range applies only to samples taken after fasting for at least 8 hours.  Glucose, capillary     Status: Abnormal   Collection Time: 09/24/22  3:04 PM  Result Value Ref Range   Glucose-Capillary 151 (H) 70 - 99 mg/dL    Comment: Glucose reference range applies only to samples taken after fasting for at least 8 hours.  Glucose, capillary     Status: Abnormal   Collection Time: 09/26/22  2:55 PM  Result Value Ref Range   Glucose-Capillary 148 (H) 70 - 99 mg/dL    Comment: Glucose reference range applies only to samples taken after fasting for at least 8 hours.  Glucose, capillary     Status: Abnormal   Collection Time: 10/10/22  2:58 PM  Result Value Ref Range   Glucose-Capillary 122 (H) 70 - 99 mg/dL    Comment: Glucose reference range applies only to samples taken after fasting for at least 8 hours.  Glucose, capillary     Status: Abnormal   Collection Time: 11/02/22  3:03 PM  Result Value Ref Range   Glucose-Capillary 152 (H) 70 - 99 mg/dL    Comment: Glucose reference range applies only to samples taken after fasting for at least 8 hours.  Basic metabolic panel     Status: Abnormal   Collection Time: 11/06/22 11:49 PM  Result Value Ref Range   Sodium 142 135 - 145 mmol/L   Potassium 4.0 3.5 - 5.1 mmol/L   Chloride 105 98 - 111 mmol/L   CO2 25 22 - 32 mmol/L   Glucose, Bld 120 (H) 70 - 99 mg/dL    Comment: Glucose reference  range applies only to samples taken after fasting for at least 8 hours.   BUN 18 8 - 23 mg/dL   Creatinine, Ser 1.61 (H) 0.44 - 1.00 mg/dL   Calcium 9.8 8.9 - 09.6 mg/dL   GFR, Estimated 57 (L) >60 mL/min    Comment: (NOTE) Calculated using the CKD-EPI Creatinine Equation (2021)    Anion gap 12 5 - 15    Comment: Performed at [facility]  Hospital Lab, [address] [place], [place] [address]  CBC     Status: Abnormal   Collection Time: 11/06/22 11:49 PM  Result Value Ref Range   WBC 10.0 4.0 - 10.5 K/uL   RBC 5.25 (H) 3.87 - 5.11 MIL/uL   Hemoglobin 13.8 12.0 - 15.0 g/dL   HCT 60.4 54.0 - 98.1 %   MCV 85.0 80.0 - 100.0 fL   MCH 26.3 26.0 - 34.0 pg   MCHC 30.9 30.0 - 36.0 g/dL   RDW 19.1 47.8 - 29.5 %   Platelets 265 150 - 400 K/uL   nRBC 0.0 0.0 - 0.2 %    Comment: Performed at [facility], [address] [place], [place] [address]  Troponin I (High Sensitivity)     Status: None   Collection Time: 11/06/22 11:49 PM  Result Value Ref Range   Troponin I (High Sensitivity) 17 <18 ng/L    Comment: (NOTE) Elevated high sensitivity troponin I (hsTnI) values and significant  changes across serial measurements may suggest ACS but many other  chronic and acute conditions are known to elevate hsTnI results.  Refer to the "Links" section for chest pain algorithms and additional  guidance. Performed at [facility], [address] [place], [place] [address]   Troponin I (High Sensitivity)     Status: Abnormal   Collection Time: 11/07/22  1:43 AM  Result Value Ref Range   Troponin I (High Sensitivity) 19 (H) <18 ng/L    Comment: (NOTE) Elevated high sensitivity troponin I (hsTnI) values and significant  changes across serial measurements may suggest ACS but many other  chronic and acute conditions are known to elevate hsTnI results.  Refer to the "Links" section for chest pain algorithms and additional  guidance. Performed at [facility] Lab, [address]  [place], [place] [address]   Glucose, capillary     Status: None   Collection Time: 11/16/22  3:06 PM  Result Value Ref Range   Glucose-Capillary 76 70 - 99 mg/dL    Comment: Glucose reference range applies only to samples taken after fasting for at least 8 hours.  Glucose, capillary     Status: None   Collection Time: 11/16/22  3:27 PM  Result Value Ref Range   Glucose-Capillary 83 70 - 99 mg/dL    Comment: Glucose reference range applies only to samples taken after fasting for at least 8 hours.        Garner Nash, MD, MS

## 2022-11-20 NOTE — Patient Instructions (Addendum)
VISIT SUMMARY:  Dear Stacey Fletcher, during your recent visit, we discussed your recent pneumonia, heart failure, and thyroid issues. You've completed your course of antibiotics for pneumonia and have seen improvement, but are still experiencing some congestion. Your heart health has improved significantly after your pulmonary rehab program, and you need a refill for your heart medication. We also discussed your thyroid medication, which you've not been taking recently.  YOUR PLAN:  -RECENT PNEUMONIA: You've completed your antibiotics course and your symptoms have improved. However, you're still experiencing some nasal congestion. To help with this, I recommend starting nasal steroids and antihistamine sprays.  -HEART FAILURE: Your heart health has improved after your pulmonary rehab program. I've refilled your Furosemide medication, which helps manage your heart failure by removing excess fluid from your body. I also recommend that you follow up with your cardiologist, Dr. Gasper Lloyd , for future adjustments in your heart medications.  -HYPOTHYROIDISM: You've not been taking your Levothyroxine medication, which is used to treat low thyroid levels. I've ordered thyroid function tests to assess your current thyroid status.  INSTRUCTIONS:  I've ordered some fasting blood work for you, which includes tests for kidney function, urine analysis, blood counts, and lipid profile. These tests will help [place] understand your overall health better. Please plan for a follow-up appointment in one month so we can discuss these lab results.

## 2022-11-21 ENCOUNTER — Encounter (HOSPITAL_COMMUNITY)
Admission: RE | Admit: 2022-11-21 | Discharge: 2022-11-21 | Disposition: A | Discharge status: Home / Self Care | Payer: 59 | Source: Ambulatory Visit | Attending: Cardiology

## 2022-11-21 DIAGNOSIS — Z5189 Encounter for other specified aftercare: Secondary | ICD-10-CM | POA: Diagnosis not present

## 2022-11-21 DIAGNOSIS — I5022 Chronic systolic (congestive) heart failure: Secondary | ICD-10-CM | POA: Diagnosis not present

## 2022-11-21 LAB — GLUCOSE, CAPILLARY
Glucose-Capillary: 107 mg/dL — ABNORMAL HIGH (ref 70–99)
Glucose-Capillary: 81 mg/dL (ref 70–99)

## 2022-11-21 NOTE — Assessment & Plan Note (Signed)
Effective cardiac rehab completion noted. Follow-up with cardiology required for ongoing management. Continue current heart failure medications.

## 2022-11-21 NOTE — Progress Notes (Signed)
Discharge Progress Report  Patient Details  Name: Stacey Fletcher MRN: [id number] Date of Birth: September 23, 1950 Referring Provider:   Flowsheet Row INTENSIVE CARDIAC REHAB ORIENT from 08/28/2022 in [facility]  Referring Provider Dorthula Nettles, DO        Number of Visits: 40  Reason for Discharge:  Patient reached a stable level of exercise. Patient has met program and personal goals.  Smoking History:  Social History   Tobacco Use  Smoking Status Former   Current packs/day: 0.00   Types: Cigarettes   Quit date: 07/07/2018   Years since quitting: 4.4   Passive exposure: Never  Smokeless Tobacco Never    Diagnosis:  Heart failure, chronic systolic (HCC)  ADL UCSD:   Initial Exercise Prescription:  Initial Exercise Prescription - 08/28/22 1600       Date of Initial Exercise RX and Referring Provider   Date 08/28/22    Referring Provider Dorthula Nettles, DO    Expected Discharge Date 11/21/22      Recumbant Bike   Level 1    Watts 25    Minutes 15    METs 2.2      NuStep   Level 1    SPM 75    Minutes 15    METs 2.2      Prescription Details   Frequency (times per week) 3    Duration Progress to 30 minutes of continuous aerobic without signs/symptoms of physical distress      Intensity   THRR 40-80% of Max Heartrate 59-118    Ratings of Perceived Exertion 11-13    Perceived Dyspnea 0-4      Progression   Progression Continue to progress workloads to maintain intensity without signs/symptoms of physical distress.      Resistance Training   Training Prescription Yes    Weight 2 lbs    Reps 10-15             Discharge Exercise Prescription (Final Exercise Prescription Changes):  Exercise Prescription Changes - 11/21/22 1656       Response to Exercise   Blood Pressure (Admit) 124/72    Blood Pressure (Exercise) --   Pt over 10 exercise sessions, none needed   Blood Pressure  (Exit) 122/64    Heart Rate (Admit) 84 bpm    Heart Rate (Exercise) 96 bpm    Heart Rate (Exit) 81 bpm    Rating of Perceived Exertion (Exercise) 9    Perceived Dyspnea (Exercise) 0    Symptoms none    Comments Pt graduated teh [employer] program.    Duration Progress to 30 minutes of  aerobic without signs/symptoms of physical distress    Intensity THRR unchanged      Progression   Progression Continue to progress workloads to maintain intensity without signs/symptoms of physical distress.    Average METs 2.2      Resistance Training   Training Prescription No      NuStep   Level 4    SPM 71    Minutes 30    METs 2.2             Functional Capacity:  6 Minute Walk     Row Name 08/28/22 1525 11/14/22 1651       6 Minute Walk   Phase Initial Discharge    Distance 1205 feet 1250 feet    Distance % Change -- 3.73 %    Distance Feet Change --  45 ft    Walk Time 6 minutes 6 minutes    # of Rest Breaks 1  12-second standing rest break because nose was running 0    MPH 2.28 2.88    METS 2.89 2.37    RPE 7 9    Perceived Dyspnea  1 0    VO2 Peak 10.11 10.06    Symptoms Yes (comment) No    Comments Mild SOB, runny nose --    Resting HR 83 bpm 77 bpm    Resting BP 122/74 122/74    Resting Oxygen Saturation  97 % --    Exercise Oxygen Saturation  during 6 min walk 98 % --    Max Ex. HR 137 bpm 118 bpm    Max Ex. BP 146/72 160/74    2 Minute Post BP 138/74 --             Psychological, QOL, Others - Outcomes: PHQ 2/9:    11/21/2022    4:21 PM 11/13/2022    2:40 PM 09/07/2022    1:51 PM 08/29/2022    5:05 PM  Depression screen PHQ 2/9  Decreased Interest 0 0 1 1  Down, Depressed, Hopeless 0 0 3 1  PHQ - 2 Score 0 0 4 2  Altered sleeping 0  3 2  Tired, decreased energy 0  1 1  Change in appetite 1  3 1   Feeling bad or failure about yourself  0  0 0  Trouble concentrating 1  0 0  Moving slowly or fidgety/restless 0  0 0  Suicidal thoughts 0  0 0   PHQ-9 Score 2  11 6   Difficult doing work/chores Not difficult at all  Somewhat difficult Not difficult at all    Quality of Life:  Quality of Life - 11/21/22 1726       Quality of Life   Select Quality of Life      Quality of Life Scores   Health/Function Post 27.79 %    Socioeconomic Post 27.6 %    Psych/Spiritual Post 30 %    Family Post 30 %    GLOBAL Post 28.57 %             Personal Goals: Goals established at orientation with interventions provided to work toward goal.  Personal Goals and Risk Factors at Admission - 08/28/22 1442       Core Components/Risk Factors/Patient Goals on Admission    Weight Management Yes;Obesity;Weight Loss    Intervention Weight Management/Obesity: Establish reasonable short term and long term weight goals.;Obesity: Provide education and appropriate resources to help participant work on and attain dietary goals.    Admit Weight 209 lb 10.5 oz (95.1 kg)    Expected Outcomes Short Term: Continue to assess and modify interventions until short term weight is achieved;Long Term: Adherence to nutrition and physical activity/exercise program aimed toward attainment of established weight goal;Weight Loss: Understanding of general recommendations for a balanced deficit meal plan, which promotes 1-2 lb weight loss per week and includes a negative energy balance of [id number] kcal/d    Diabetes Yes    Intervention Provide education about signs/symptoms and action to take for hypo/hyperglycemia.;Provide education about proper nutrition, including hydration, and aerobic/resistive exercise prescription along with prescribed medications to achieve blood glucose in normal ranges: Fasting glucose 65-99 mg/dL    Expected Outcomes Short Term: Participant verbalizes understanding of the signs/symptoms and immediate care of hyper/hypoglycemia, proper foot care and importance of medication, aerobic/resistive exercise  and nutrition plan for blood glucose control.;Long  Term: Attainment of HbA1C < 7%.    Heart Failure Yes    Intervention Provide a combined exercise and nutrition program that is supplemented with education, support and counseling about heart failure. Directed toward relieving symptoms such as shortness of breath, decreased exercise tolerance, and extremity edema.    Expected Outcomes Improve functional capacity of life;Short term: Attendance in program 2-3 days a week with increased exercise capacity. Reported lower sodium intake. Reported increased fruit and vegetable intake. Reports medication compliance.;Short term: Daily weights obtained and reported for increase. Utilizing diuretic protocols set by physician.;Long term: Adoption of self-care skills and reduction of barriers for early signs and symptoms recognition and intervention leading to self-care maintenance.    Hypertension Yes    Intervention Provide education on lifestyle modifcations including regular physical activity/exercise, weight management, moderate sodium restriction and increased consumption of fresh fruit, vegetables, and low fat dairy, alcohol moderation, and smoking cessation.;Monitor prescription use compliance.    Expected Outcomes Short Term: Continued assessment and intervention until BP is < 140/60mm HG in hypertensive participants. < 130/88mm HG in hypertensive participants with diabetes, heart failure or chronic kidney disease.;Long Term: Maintenance of blood pressure at goal levels.    Lipids Yes    Intervention Provide education and support for participant on nutrition & aerobic/resistive exercise along with prescribed medications to achieve LDL 70mg , HDL >40mg .    Expected Outcomes Short Term: Participant states understanding of desired cholesterol values and is compliant with medications prescribed. Participant is following exercise prescription and nutrition guidelines.;Long Term: Cholesterol controlled with medications as prescribed, with individualized exercise RX  and with personalized nutrition plan. Value goals: LDL < 70mg , HDL > 40 mg.    Stress Yes    Intervention Offer individual and/or small group education and counseling on adjustment to heart disease, stress management and health-related lifestyle change. Teach and support self-help strategies.    Expected Outcomes Short Term: Participant demonstrates changes in health-related behavior, relaxation and other stress management skills, ability to obtain effective social support, and compliance with psychotropic medications if prescribed.;Long Term: Emotional wellbeing is indicated by absence of clinically significant psychosocial distress or social isolation.              Personal Goals Discharge:  Goals and Risk Factor Review     Row Name 09/05/22 1706 10/02/22 1405 10/31/22 1412 11/22/22 0839       Core Components/Risk Factors/Patient Goals Review   Personal Goals Review Weight Management/Obesity;Heart Failure;Stress;Hypertension;Lipids Weight Management/Obesity;Heart Failure;Stress;Hypertension;Lipids Weight Management/Obesity;Heart Failure;Stress;Hypertension;Lipids Weight Management/Obesity;Heart Failure;Stress;Hypertension;Lipids    Review [place] started cardiac rehab on 09/05/22 and did well with exercise. Vital signs and CBG's were stable Raylee is doing well with exercise at cardiac rehab . Vital signs and CBG's have been stable. Taysha has lost 1.3 kg since starting the program. Pamale is doing well with exercise at cardiac rehab . Vital signs and CBG's have been stable. Jillianne has lost 2.1 kg since starting the program. Jamilet is doing well with exercise at cardiac rehab . Vital signs and CBG's have been noted to be lower. Maraget says she sometimes does not have an appetite. Encouraged to follow up with PCP. Claris Che has lost 3.7  kg since starting the program. Claris Che completed cardiac rehab on 11/21/22.    Expected Outcomes Porsche will continue to participate in cardiac  rehab for exercise, nutrtion and lifestyle modifcations. Joiya will continue to participate in cardiac rehab for exercise, nutrtion and lifestyle modifcations. Maetta will  continue to participate in cardiac rehab for exercise, nutrtion and lifestyle modifcations. Aston will continue exercise follow , nutrtion and lifestyle modifcations upon completion of cardiac rehab.             Exercise Goals and Review:  Exercise Goals     Row Name 08/28/22 1442             Exercise Goals   Increase Physical Activity Yes       Intervention Provide advice, education, support and counseling about physical activity/exercise needs.;Develop an individualized exercise prescription for aerobic and resistive training based on initial evaluation findings, risk stratification, comorbidities and participant's personal goals.       Expected Outcomes Short Term: Attend rehab on a regular basis to increase amount of physical activity.;Long Term: Add in home exercise to make exercise part of routine and to increase amount of physical activity.;Long Term: Exercising regularly at least 3-5 days a week.       Increase Strength and Stamina Yes       Intervention Provide advice, education, support and counseling about physical activity/exercise needs.;Develop an individualized exercise prescription for aerobic and resistive training based on initial evaluation findings, risk stratification, comorbidities and participant's personal goals.       Expected Outcomes Long Term: Improve cardiorespiratory fitness, muscular endurance and strength as measured by increased METs and functional capacity ( );Short Term: Perform resistance training exercises routinely during rehab and add in resistance training at home;Short Term: Increase workloads from initial exercise prescription for resistance, speed, and METs.       Able to understand and use rate of perceived exertion (RPE) scale Yes       Intervention Provide education  and explanation on how to use RPE scale       Expected Outcomes Short Term: Able to use RPE daily in rehab to express subjective intensity level;Long Term:  Able to use RPE to guide intensity level when exercising independently       Knowledge and understanding of Target Heart Rate Range (THRR) Yes       Intervention Provide education and explanation of THRR including how the numbers were predicted and where they are located for reference       Expected Outcomes Short Term: Able to state/look up THRR;Long Term: Able to use THRR to govern intensity when exercising independently;Short Term: Able to use daily as guideline for intensity in rehab       Able to check pulse independently Yes       Intervention Provide education and demonstration on how to check pulse in carotid and radial arteries.;Review the importance of being able to check your own pulse for safety during independent exercise       Expected Outcomes Short Term: Able to explain why pulse checking is important during independent exercise;Long Term: Able to check pulse independently and accurately       Understanding of Exercise Prescription Yes       Intervention Provide education, explanation, and written materials on patient's individual exercise prescription       Expected Outcomes Short Term: Able to explain program exercise prescription;Long Term: Able to explain home exercise prescription to exercise independently                Exercise Goals Re-Evaluation:  Exercise Goals Re-Evaluation     Row Name 09/05/22 1638 09/26/22 1621 10/31/22 1620 11/14/22 1637 11/21/22 1658     Exercise Goal Re-Evaluation   Exercise Goals Review Increase Physical Activity;Understanding of Exercise  Prescription;Increase Strength and Stamina;Knowledge and understanding of Target Heart Rate Range (THRR);Able to understand and use rate of perceived exertion (RPE) scale Increase Physical Activity;Understanding of Exercise Prescription;Increase Strength  and Stamina;Knowledge and understanding of Target Heart Rate Range (THRR);Able to understand and use rate of perceived exertion (RPE) scale Increase Physical Activity;Understanding of Exercise Prescription;Increase Strength and Stamina;Knowledge and understanding of Target Heart Rate Range (THRR);Able to understand and use rate of perceived exertion (RPE) scale Increase Physical Activity;Understanding of Exercise Prescription;Increase Strength and Stamina;Knowledge and understanding of Target Heart Rate Range (THRR);Able to understand and use rate of perceived exertion (RPE) scale Increase Physical Activity;Understanding of Exercise Prescription;Increase Strength and Stamina;Knowledge and understanding of Target Heart Rate Range (THRR);Able to understand and use rate of perceived exertion (RPE) scale   Comments Pt first day in the [facility] program. Pt tolerated exercise well with an average MET level of 1.4. Pt attempted recumbent bike, but was uncomfortable for her knees, Octane worked out much better. She felt good with her new ExRx and she is off to a good start Reviewed MET's and goals. Pt tolerated exercise well with an average MET level of 2.1. Talked with patient about home ExRx, but she states this is a lot for exercise right now. Will re-evaluated home ExRx soon. Pt states she feels good about her goals of getting moving and back into exercise. She is also increasing her strength and is able to do more, especially in the yard Reviewed MET's and goals. Pt tolerated exercise well with an average MET level of 2.2. She feels good about her goals and is gaining strength and getting back into an exercise routine. Reviewed MET's and goals. Pt tolerated exercise well with an average MET level of 2.56. She feels good about her goals and is gaining strength. She is able to do more around the house and yard, but still doesn not feel comfortable exercising on her own and would like to join an exercise program. She  will begin working out at [employer] upon graduation. Will send a referral on her graduation Pt graduated the [employer] program. Pt tolerated exercise well with an average MET level of 2.2. She enjoyed [employer] and will continue her exercise at [employer] 3-5 days for 30-45 mins per session. She increased on her Post by 27ft for a total of 1267ft.   Expected Outcomes Will continue to increase workloads as tolerated without sign or symtpom Will continue to increase workloads as tolerated without sign or symtpom Will continue to increase workloads as tolerated without sign or symtpom Will continue to increase workloads as tolerated without sign or symtpom Pt will exercise on her own and gain strength            Nutrition & Weight - Outcomes:  Pre Biometrics - 08/28/22 1311       Pre Biometrics   Waist Circumference 41 inches    Hip Circumference 45.75 inches    Waist to Hip Ratio 0.9 %    Triceps Skinfold 23 mm    % Body Fat 41.5 %    Grip Strength 28 kg    Flexibility 11.5 in    Single Leg Stand 9.18 seconds             Post Biometrics - 11/14/22 1653        Post  Biometrics   Height 5' 8.75" (1.746 m)    Weight 92.8 kg    Waist Circumference 40 inches    Hip Circumference 44  inches    Waist to Hip Ratio 0.91 %    BMI (Calculated) 30.44    Triceps Skinfold 19 mm    % Body Fat 39.8 %    Grip Strength 27 kg    Flexibility 19 in    Single Leg Stand 16 seconds             Nutrition:  Nutrition Therapy & Goals - 11/21/22 1004       Nutrition Therapy   Diet Heart Healthy Diet    Drug/Food Interactions Statins/Certain Fruits      Personal Nutrition Goals   Nutrition Goal Patient to identify strategies for reducing cardiovascular risk by attending the [facility] education and nutrition series weekly.   goal in progress.   Personal Goal #2 Patient to improve diet quality by using the plate method as a guide for meal planning to include lean protein/plant  protein, fruits, vegetables, whole grains, nonfat dairy as part of a well-balanced diet.   goal in progress.   Personal Goal #3 Patient to reduce sodium intake to 1500mg  per day.   goal in progress.   Comments Goals in progress. Shametria has attended the [employer] and nutrition series regularly. She reports better understanding of sodium, fiber, and saturated fat recommendations. She is down 8.1# since starting with our program. No updated labs since starting with our program but expect improved A1c with lifestyle changes and weight loss. Patient will benefit from adhereance to nutrition, exercise, and lifestyle modification recommendations.      Intervention Plan   Intervention Prescribe, educate and counsel regarding individualized specific dietary modifications aiming towards targeted core components such as weight, hypertension, lipid management, diabetes, heart failure and other comorbidities.;Nutrition handout(s) given to patient.    Expected Outcomes Short Term Goal: Understand basic principles of dietary content, such as calories, fat, sodium, cholesterol and nutrients.;Long Term Goal: Adherence to prescribed nutrition plan.             Nutrition Discharge:  Nutrition Assessments - 09/20/22 0827       Rate Your Plate Scores   Pre Score 52             Education Questionnaire Score:  Knowledge Questionnaire Score - 11/21/22 1727       Knowledge Questionnaire Score   Post Score 21/24             Goals reviewed with patient; copy given to patient.Pt graduates from  Intensive/Traditional cardiac rehab program on 11/21/22 with completion of  40 exercise and education sessions. Pt maintained good attendance and progressed nicely during their participation in rehab as evidenced by increased MET level. Nelida increased her distance on her post exercise walk test by 45 feet. Ciarra lost 3.7 kg while enrolled in the program.   Medication list reconciled. Repeat  PHQ  score- 2 .  Pt has made significant lifestyle changes and should be commended for their success. Dafina achieved their goals during cardiac rehab.   Pt plans to continue exercise at the [facility]. We are proud of Vietta's progress! Arthena says that participating in cardiac rehab has been helpful for her! Rehana says she has had some memory deficits in the past few months she plans to discuss with her primary care provider. Thayer Headings RN BSN

## 2022-11-21 NOTE — Assessment & Plan Note (Signed)
Start nasal steroid/antihistamine Dymista.

## 2022-11-21 NOTE — Assessment & Plan Note (Signed)
Recheck levels

## 2022-12-20 ENCOUNTER — Ambulatory Visit: Payer: 59 | Admitting: Family Medicine

## 2022-12-20 ENCOUNTER — Telehealth: Payer: Self-pay

## 2022-12-20 NOTE — Telephone Encounter (Signed)
Contacted patient and offered to reschedule her appt today due to the provider. She rescheduled to 12/21/22 at 1:20pm. Pt is aware to fast.

## 2022-12-21 ENCOUNTER — Ambulatory Visit (INDEPENDENT_AMBULATORY_CARE_PROVIDER_SITE_OTHER): Payer: 59 | Admitting: Family Medicine

## 2022-12-21 ENCOUNTER — Encounter: Payer: Self-pay | Admitting: Family Medicine

## 2022-12-21 VITALS — BP 136/86 | HR 112 | Temp 97.7°F | Wt 198.8 lb

## 2022-12-21 DIAGNOSIS — B351 Tinea unguium: Secondary | ICD-10-CM

## 2022-12-21 DIAGNOSIS — Z7985 Long-term (current) use of injectable non-insulin antidiabetic drugs: Secondary | ICD-10-CM | POA: Diagnosis not present

## 2022-12-21 DIAGNOSIS — E11628 Type 2 diabetes mellitus with other skin complications: Secondary | ICD-10-CM

## 2022-12-21 DIAGNOSIS — I5042 Chronic combined systolic (congestive) and diastolic (congestive) heart failure: Secondary | ICD-10-CM | POA: Diagnosis not present

## 2022-12-21 DIAGNOSIS — I10 Essential (primary) hypertension: Secondary | ICD-10-CM

## 2022-12-21 DIAGNOSIS — E1122 Type 2 diabetes mellitus with diabetic chronic kidney disease: Secondary | ICD-10-CM | POA: Diagnosis not present

## 2022-12-21 DIAGNOSIS — N1831 Chronic kidney disease, stage 3a: Secondary | ICD-10-CM

## 2022-12-21 DIAGNOSIS — N179 Acute kidney failure, unspecified: Secondary | ICD-10-CM

## 2022-12-21 DIAGNOSIS — Z1211 Encounter for screening for malignant neoplasm of colon: Secondary | ICD-10-CM

## 2022-12-21 LAB — CBC WITH DIFFERENTIAL/PLATELET
Basophils Absolute: 0 10*3/uL (ref 0.0–0.1)
Basophils Relative: 0.5 % (ref 0.0–3.0)
Eosinophils Absolute: 0.1 10*3/uL (ref 0.0–0.7)
Eosinophils Relative: 1.6 % (ref 0.0–5.0)
HCT: 41.8 % (ref 36.0–46.0)
Hemoglobin: 13.2 g/dL (ref 12.0–15.0)
Lymphocytes Relative: 28.9 % (ref 12.0–46.0)
Lymphs Abs: 2.1 10*3/uL (ref 0.7–4.0)
MCHC: 31.5 g/dL (ref 30.0–36.0)
MCV: 85.2 fL (ref 78.0–100.0)
Monocytes Absolute: 0.5 10*3/uL (ref 0.1–1.0)
Monocytes Relative: 6.8 % (ref 3.0–12.0)
Neutro Abs: 4.6 10*3/uL (ref 1.4–7.7)
Neutrophils Relative %: 62.2 % (ref 43.0–77.0)
Platelets: 221 10*3/uL (ref 150.0–400.0)
RBC: 4.91 Mil/uL (ref 3.87–5.11)
RDW: 14.9 % (ref 11.5–15.5)
WBC: 7.3 10*3/uL (ref 4.0–10.5)

## 2022-12-21 LAB — COMPREHENSIVE METABOLIC PANEL
ALT: 14 U/L (ref 0–35)
AST: 16 U/L (ref 0–37)
Albumin: 4.2 g/dL (ref 3.5–5.2)
Alkaline Phosphatase: 118 U/L — ABNORMAL HIGH (ref 39–117)
BUN: 17 mg/dL (ref 6–23)
CO2: 31 meq/L (ref 19–32)
Calcium: 9.4 mg/dL (ref 8.4–10.5)
Chloride: 106 meq/L (ref 96–112)
Creatinine, Ser: 0.81 mg/dL (ref 0.40–1.20)
GFR: 71.61 mL/min (ref >60.00)
Glucose, Bld: 140 mg/dL — ABNORMAL HIGH (ref 70–99)
Potassium: 3.9 meq/L (ref 3.5–5.1)
Sodium: 142 meq/L (ref 135–145)
Total Bilirubin: 0.4 mg/dL (ref 0.2–1.2)
Total Protein: 6.3 g/dL (ref 6.0–8.3)

## 2022-12-21 LAB — POCT GLYCOSYLATED HEMOGLOBIN (HGB A1C): Hemoglobin A1C: 7.2 % — AB (ref 4.0–5.6)

## 2022-12-21 LAB — TSH: TSH: 1.38 u[IU]/mL (ref 0.35–5.50)

## 2022-12-21 LAB — LIPID PANEL
Cholesterol: 174 mg/dL (ref 0–200)
HDL: 61 mg/dL (ref >39.00)
LDL Cholesterol: 97 mg/dL (ref 0–99)
NonHDL: 113.14
Total CHOL/HDL Ratio: 3
Triglycerides: 79 mg/dL (ref 0.0–149.0)
VLDL: 15.8 mg/dL (ref 0.0–40.0)

## 2022-12-21 LAB — MICROALBUMIN / CREATININE URINE RATIO
Creatinine,U: 149.8 mg/dL
Microalb Creat Ratio: 0.6 mg/g (ref 0.0–30.0)
Microalb, Ur: 0.9 mg/dL (ref 0.0–1.9)

## 2022-12-21 MED ORDER — DEXCOM G7 RECEIVER DEVI: 1.0000 | 0 refills | Status: AC | PRN

## 2022-12-21 MED ORDER — TIRZEPATIDE 2.5 MG/0.5ML ~~LOC~~ SOAJ
2.5000 mg | SUBCUTANEOUS | 0 refills | Status: DC
Start: 2022-12-21 — End: 2023-02-19

## 2022-12-21 MED ORDER — DEXCOM G7 SENSOR MISC: 1.0000 | 0 refills | Status: AC

## 2022-12-21 NOTE — Patient Instructions (Signed)
For diabetes, start tirzepitide 2.5 mg injection weekly.   Continue all other medications as prescribed.    We are referring to the foot doctor for your nail care.  Please follow-up with eye exam for diabetes annually.

## 2022-12-21 NOTE — Progress Notes (Unsigned)
Assessment/Plan:   Problem List Items Addressed This Visit       Cardiovascular and Mediastinum   HTN (hypertension)    Stable. Plan: Continue current antihypertensive therapy. Encourage lifestyle modifications, including diet and regular exercise.      Chronic combined systolic and diastolic heart failure (HCC)   Relevant Medications   Continuous Glucose Receiver (DEXCOM G7 RECEIVER) DEVI   Continuous Glucose Sensor (DEXCOM G7 SENSOR) MISC     Endocrine   Diabetes mellitus (HCC)     Improving but not at goal. Plan: Initiate tirzepatide 2.5 mg subcutaneous injection once weekly to improve glycemic control and provide cardiovascular benefits. Continue metformin 1,000 mg orally twice daily. Continue dapagliflozin 10 mg orally once daily. Educated patient on proper administration of injectable medication and potential side effects, including gastrointestinal upset and nausea. Encouraged patient to schedule an appointment with ophthalmology.       Relevant Medications   tirzepatide (MOUNJARO) 2.5 MG/0.5ML Pen   Continuous Glucose Receiver (DEXCOM G7 RECEIVER) DEVI   Continuous Glucose Sensor (DEXCOM G7 SENSOR) MISC   Other Relevant Orders   POCT glycosylated hemoglobin (Hb A1C) (Completed)   AMB Referral [facility] Care Management   Ambulatory referral to Podiatry   POCT glycosylated hemoglobin (Hb A1C) (Completed)   AMB Referral [facility] Care Management     Musculoskeletal and Integument   Onychomycosis    Refer to podiatry for evaluation and management of diabetic footcare, toenail fungal infection          Genitourinary   ARF (acute renal failure) (HCC)   Other Visit Diagnoses     Screening for colon cancer    -  Primary   Relevant Orders   Cologuard       Medications Discontinued During This Encounter  Medication Reason   gabapentin (NEURONTIN) 100 MG capsule    Menthol-Methyl Salicylate (MUSCLE RUB) 10-15 % CREA    fluticasone (FLONASE) 50 MCG/ACT nasal  spray    cetirizine (ZYRTEC) 10 MG tablet     Return in about 3 months (around 03/23/2023) for BP, DM.    Subjective:   Encounter date: 12/21/2022  Stacey Fletcher is a 72 y.o. female who has Chest pain; Nausea vomiting and diarrhea; Fever; HTN (hypertension); Hypothyroidism; Diabetes mellitus (HCC); ARF (acute renal failure) (HCC); Pneumonia; Osteoarthritis; MI (myocardial infarction) (HCC); Hypertension; Goiter; Fibromyalgia; Depression; Coronary artery disease; Arthritis of carpometacarpal (CMC) joint of right thumb; Pain of right hand; Elevated troponin; Acute CHF (congestive heart failure) (HCC); Severe mitral regurgitation; Chronic combined systolic and diastolic heart failure (HCC); Allergic rhinitis due to allergen; and Onychomycosis on their problem list..   She  has a past medical history of Coronary artery disease, Depression, Diabetes mellitus, Fibromyalgia, Goiter, Hypertension, MI (myocardial infarction) (HCC), Osteoarthritis, and Pneumonia..   Chief Complaint: Follow-up on hypertension and diabetes management.  History of Present Illness:  Patient presents for routine follow-up on hypertension and type 2 diabetes mellitus. Hemoglobin A1C is 7.2%, slightly improved from the 7.4% Admits to missing diabetes medication today. Currently taking metformin 1,000 mg twice daily and dapagliflozin 10 mg once daily. Patient expressed concerns about self-injection but is open to starting therapy.  Blood pressure is stable at 136/86 mmHg.  Recently graduated from cardiac rehabilitation; plans to join a gym to continue exercising.  Reports a recent episode of pneumonia, now resolved; breathing and congestion have improved.  Nasal congestion has been managed effectively with Dymista nasal spray.  Expressed concern about yellowing and thickening of toenails.  Past Surgical History:  Procedure Laterality Date   ABDOMINAL HYSTERECTOMY     BREAST SURGERY     BUBBLE STUDY   12/14/2021   Procedure: BUBBLE STUDY;  Surgeon: Christell Constant, MD;  Location: [facility];  Service: Cardiovascular;;   CARDIAC CATHETERIZATION     1995   REDUCTION MAMMAPLASTY Bilateral    RIGHT/LEFT HEART CATH AND CORONARY ANGIOGRAPHY N/A 07/21/2021   Procedure: RIGHT/LEFT HEART CATH AND CORONARY ANGIOGRAPHY;  Surgeon: Runell Gess, MD;  Location: [facility] IN[facility]ASIVE [facility];  Service: Cardiovascular;  Laterality: N/A;   TEE WITHOUT CARDIOVERSION N/A 12/14/2021   Procedure: TRANSESOPHAGEAL ECHOCARDIOGRAM (TEE);  Surgeon: Christell Constant, MD;  Location: [facility] ENDOSCOPY;  Service: Cardiovascular;  Laterality: N/A;    Outpatient Medications Prior to Visit  Medication Sig Dispense Refill   aspirin 81 MG chewable tablet Chew 81 mg by mouth in the morning.     Azelastine-Fluticasone 137-50 MCG/ACT SUSP Place 1 spray into the nose every 12 (twelve) hours. 23 g 11   buPROPion (WELLBUTRIN XL) 300 MG 24 hr tablet Take 300 mg by mouth every morning.     dapagliflozin propanediol (FARXIGA) 10 MG TABS tablet Take 1 tablet (10 mg total) by mouth daily. 90 tablet 3   digoxin (LANOXIN) 0.125 MG tablet Take 1 tablet (0.125 mg total) by mouth daily. 90 tablet 3   diphenhydrAMINE (BENADRYL) 25 MG tablet Take 25 mg by mouth at bedtime.     furosemide (LASIX) 40 MG tablet Take 1 tablet (40 mg total) by mouth every other day. 45 tablet 0   levothyroxine (SYNTHROID) 25 MCG tablet Take 25 mcg by mouth daily before breakfast.     losartan (COZAAR) 100 MG tablet Take 1 tablet (100 mg total) by mouth daily. 90 tablet 3   losartan (COZAAR) 50 MG tablet Take 1 tablet (50 mg total) by mouth daily. 90 tablet 3   metFORMIN (GLUCOPHAGE) 1000 MG tablet Take 1,000 mg by mouth daily.     metoprolol succinate (TOPROL-XL) 50 MG 24 hr tablet Take 3 tablets (150 mg total) by mouth daily. Take with or immediately following a meal. 90 tablet 3   Potassium Chloride ER 20 MEQ TBCR Take 1 tablet (20 mEq total) by mouth  every other day. 45 tablet 3   pravastatin (PRAVACHOL) 20 MG tablet Take 20 mg by mouth in the morning.     spironolactone (ALDACTONE) 25 MG tablet Take 1 tablet (25 mg total) by mouth daily. 90 tablet 3   venlafaxine XR (EFFEXOR-XR) 75 MG 24 hr capsule Take 75 mg by mouth in the morning, at noon, and at bedtime.     cetirizine (ZYRTEC) 10 MG tablet Take 10 mg by mouth daily as needed for allergies. (Patient not taking: Reported on 11/20/2022)     fluticasone (FLONASE) 50 MCG/ACT nasal spray Place 1 spray into both nostrils daily as needed for allergies or rhinitis. (Patient not taking: Reported on 11/21/2022)     gabapentin (NEURONTIN) 100 MG capsule TAKE 1 CAPSULE(100 MG) BY MOUTH AT BEDTIME 30 capsule 6   Menthol-Methyl Salicylate (MUSCLE RUB) 10-15 % CREA Apply 1 Application topically 2 (two) times a week.     No facility-administered medications prior to visit.    Family History  Problem Relation Age of Onset   Hypertension Mother    Diabetes Mother    Stroke Mother    Hypertension Other    Diabetes Other     Social History   Socioeconomic History  Marital status: Divorced    Spouse name: Not on file   Number of children: 3   Years of education: 16   Highest education level: Bachelor's degree (e.g., BA, AB, BS)  Occupational History   Occupation: retired  Tobacco Use   Smoking status: Former    Current packs/day: 0.00    Types: Cigarettes    Quit date: 07/07/2018    Years since quitting: 4.4    Passive exposure: Never   Smokeless tobacco: Never  Vaping Use   Vaping status: Never Used  Substance and Sexual Activity   Alcohol use: No   Drug use: No   Sexual activity: Not Currently  Other Topics Concern   Not on file  Social History Narrative   Not on file   Social Determinants of Health   Financial Resource Strain: Low Risk  (11/12/2022)   Overall Financial Resource Strain (CARDIA)    Difficulty of Paying Living Expenses: Not very hard  Food Insecurity: No Food  Insecurity (11/12/2022)   Hunger Vital Sign    Worried About Running Out of Food in the Last Year: Never true    Ran Out of Food in the Last Year: Never true  Transportation Needs: No Transportation Needs (11/12/2022)   PRAPARE - Administrator, Civil Service (Medical): No    Lack of Transportation (Non-Medical): No  Physical Activity: Sufficiently Active (11/12/2022)   Exercise Vital Sign    Days of Exercise per Week: 3 days    Minutes of Exercise per Session: 140 min  Stress: Stress Concern Present (11/12/2022)   [employer] of Occupational Health - Occupational Stress Questionnaire    Feeling of Stress : To some extent  Social Connections: Unknown (11/12/2022)   Social Connection and Isolation Panel [NHANES]    Frequency of Communication with Friends and Family: Three times a week    Frequency of Social Gatherings with Friends and Family: Once a week    Attends Religious Services: Not on Marketing executive or Organizations: Yes    Attends Banker Meetings: More than 4 times per year    Marital Status: Divorced  Intimate Partner Violence: Not At Risk (11/13/2022)   Humiliation, Afraid, Rape, and Kick questionnaire    Fear of Current or Ex-Partner: No    Emotionally Abused: No    Physically Abused: No    Sexually Abused: No                                                                                                  Objective:  Physical Exam: BP 136/86 (BP Location: Left Arm, Patient Position: Sitting, Cuff Size: Large)   Pulse (!) 112   Temp 97.7 F (36.5 C) (Temporal)   Wt 198 lb 12.8 oz (90.2 kg)   SpO2 99%   BMI 29.57 kg/m     Physical Exam Constitutional:      General: She is not in acute distress.    Appearance: Normal appearance. She is not ill-appearing or toxic-appearing.  HENT:     Head: Normocephalic and atraumatic.  Nose: Nose normal. No congestion.  Eyes:     General: No scleral icterus.    Extraocular  Movements: Extraocular movements intact.  Cardiovascular:     Rate and Rhythm: Normal rate and regular rhythm.     Pulses: Normal pulses.     Heart sounds: Normal heart sounds.  Pulmonary:     Effort: Pulmonary effort is normal. No respiratory distress.     Breath sounds: Normal breath sounds.  Abdominal:     General: Abdomen is flat. Bowel sounds are normal.     Palpations: Abdomen is soft.  Musculoskeletal:        General: Normal range of motion.  Lymphadenopathy:     Cervical: No cervical adenopathy.  Skin:    General: Skin is warm and dry.     Findings: No rash.  Neurological:     General: No focal deficit present.     Mental Status: She is alert and oriented to person, place, and time. Mental status is at baseline.  Psychiatric:        Mood and Affect: Mood normal.        Behavior: Behavior normal.        Thought Content: Thought content normal.        Judgment: Judgment normal.     Diabetic Foot Exam - Simple   Simple Foot Form Visual Inspection Sensation Testing Intact to touch and monofilament testing bilaterally: Yes Pulse Check Posterior Tibialis and Dorsalis pulse intact bilaterally: Yes Comments Nail thickening bilateral great toes.  Dry skin throughout feet bilaterally.  No ulcers or skin breakdown.     DG Chest 2 View  Result Date: 11/07/2022 CLINICAL DATA:  Chest pain for 1 day EXAM: CHEST - 2 VIEW COMPARISON:  07/20/2021 FINDINGS: Cardiac shadow is within normal limits. Lungs are well aerated bilaterally. Somewhat rounded soft tissue density is suggested in the right apex. This is not well visualized on the lateral projection. No focal infiltrate is seen. No bony abnormality is noted. IMPRESSION: Rounded density suggested in the right apex. Noncontrast CT would be helpful for further evaluation. Electronically Signed   By: Alcide Clever M.D.   On: 11/07/2022 00:12    Recent Results (from the past 2160 hour(s))  Glucose, capillary     Status: Abnormal    Collection Time: 09/26/22  2:55 PM  Result Value Ref Range   Glucose-Capillary 148 (H) 70 - 99 mg/dL    Comment: Glucose reference range applies only to samples taken after fasting for at least 8 hours.  Glucose, capillary     Status: Abnormal   Collection Time: 10/10/22  2:58 PM  Result Value Ref Range   Glucose-Capillary 122 (H) 70 - 99 mg/dL    Comment: Glucose reference range applies only to samples taken after fasting for at least 8 hours.  Glucose, capillary     Status: Abnormal   Collection Time: 11/02/22  3:03 PM  Result Value Ref Range   Glucose-Capillary 152 (H) 70 - 99 mg/dL    Comment: Glucose reference range applies only to samples taken after fasting for at least 8 hours.  Basic metabolic panel     Status: Abnormal   Collection Time: 11/06/22 11:49 PM  Result Value Ref Range   Sodium 142 135 - 145 mmol/L   Potassium 4.0 3.5 - 5.1 mmol/L   Chloride 105 98 - 111 mmol/L   CO2 25 22 - 32 mmol/L   Glucose, Bld 120 (H) 70 - 99 mg/dL  Comment: Glucose reference range applies only to samples taken after fasting for at least 8 hours.   BUN 18 8 - 23 mg/dL   Creatinine, Ser 8.29 (H) 0.44 - 1.00 mg/dL   Calcium 9.8 8.9 - 56.2 mg/dL   GFR, Estimated 57 (L) >60 mL/min    Comment: (NOTE) Calculated using the CKD-EPI Creatinine Equation (2021)    Anion gap 12 5 - 15    Comment: Performed at [facility], [address] [place], [place] [address]  CBC     Status: Abnormal   Collection Time: 11/06/22 11:49 PM  Result Value Ref Range   WBC 10.0 4.0 - 10.5 K/uL   RBC 5.25 (H) 3.87 - 5.11 MIL/uL   Hemoglobin 13.8 12.0 - 15.0 g/dL   HCT 57.8 46.9 - 62.9 %   MCV 85.0 80.0 - 100.0 fL   MCH 26.3 26.0 - 34.0 pg   MCHC 30.9 30.0 - 36.0 g/dL   RDW 52.8 41.3 - 24.4 %   Platelets 265 150 - 400 K/uL   nRBC 0.0 0.0 - 0.2 %    Comment: Performed at [facility], [address] [place], [place] [address]  Troponin I (High Sensitivity)     Status: None   Collection Time:  11/06/22 11:49 PM  Result Value Ref Range   Troponin I (High Sensitivity) 17 <18 ng/L    Comment: (NOTE) Elevated high sensitivity troponin I (hsTnI) values and significant  changes across serial measurements may suggest ACS but many other  chronic and acute conditions are known to elevate hsTnI results.  Refer to the "Links" section for chest pain algorithms and additional  guidance. Performed at [facility], [address] [place], [place] [address]   Troponin I (High Sensitivity)     Status: Abnormal   Collection Time: 11/07/22  1:43 AM  Result Value Ref Range   Troponin I (High Sensitivity) 19 (H) <18 ng/L    Comment: (NOTE) Elevated high sensitivity troponin I (hsTnI) values and significant  changes across serial measurements may suggest ACS but many other  chronic and acute conditions are known to elevate hsTnI results.  Refer to the "Links" section for chest pain algorithms and additional  guidance. Performed at [facility], [address], [place], [place] [address]   Glucose, capillary     Status: None   Collection Time: 11/16/22  3:06 PM  Result Value Ref Range   Glucose-Capillary 76 70 - 99 mg/dL    Comment: Glucose reference range applies only to samples taken after fasting for at least 8 hours.  Glucose, capillary     Status: None   Collection Time: 11/16/22  3:27 PM  Result Value Ref Range   Glucose-Capillary 83 70 - 99 mg/dL    Comment: Glucose reference range applies only to samples taken after fasting for at least 8 hours.  Glucose, capillary     Status: None   Collection Time: 11/21/22  2:50 PM  Result Value Ref Range   Glucose-Capillary 81 70 - 99 mg/dL    Comment: Glucose reference range applies only to samples taken after fasting for at least 8 hours.  Glucose, capillary     Status: Abnormal   Collection Time: 11/21/22  3:14 PM  Result Value Ref Range   Glucose-Capillary 107 (H) 70 - 99 mg/dL    Comment: Glucose reference range applies  only to samples taken after fasting for at least 8 hours.  POCT glycosylated hemoglobin (Hb A1C)     Status: Abnormal   Collection Time: 12/21/22  1:44 PM  Result Value Ref Range   Hemoglobin A1C 7.2 (A) 4.0 - 5.6 %   HbA1c POC (<> result, manual entry)     HbA1c, POC (prediabetic range)     HbA1c, POC (controlled diabetic range)    Comprehensive metabolic panel     Status: Abnormal   Collection Time: 12/21/22  2:02 PM  Result Value Ref Range   Sodium 142 135 - 145 mEq/L   Potassium 3.9 3.5 - 5.1 mEq/L   Chloride 106 96 - 112 mEq/L   CO2 31 19 - 32 mEq/L   Glucose, Bld 140 (H) 70 - 99 mg/dL   BUN 17 6 - 23 mg/dL   Creatinine, Ser 4.09 0.40 - 1.20 mg/dL   Total Bilirubin 0.4 0.2 - 1.2 mg/dL   Alkaline Phosphatase 118 (H) 39 - 117 U/L   AST 16 0 - 37 U/L   ALT 14 0 - 35 U/L   Total Protein 6.3 6.0 - 8.3 g/dL   Albumin 4.2 3.5 - 5.2 g/dL   GFR 81.19 >14.78 mL/min    Comment: Calculated using the CKD-EPI Creatinine Equation (2021)   Calcium 9.4 8.4 - 10.5 mg/dL  CBC with Differential/Platelet     Status: None   Collection Time: 12/21/22  2:02 PM  Result Value Ref Range   WBC 7.3 4.0 - 10.5 K/uL   RBC 4.91 3.87 - 5.11 Mil/uL   Hemoglobin 13.2 12.0 - 15.0 g/dL   HCT 29.5 62.1 - 30.8 %   MCV 85.2 78.0 - 100.0 fl   MCHC 31.5 30.0 - 36.0 g/dL   RDW 65.7 84.6 - 96.2 %   Platelets 221.0 150.0 - 400.0 K/uL   Neutrophils Relative % 62.2 43.0 - 77.0 %   Lymphocytes Relative 28.9 12.0 - 46.0 %   Monocytes Relative 6.8 3.0 - 12.0 %   Eosinophils Relative 1.6 0.0 - 5.0 %   Basophils Relative 0.5 0.0 - 3.0 %   Neutro Abs 4.6 1.4 - 7.7 K/uL   Lymphs Abs 2.1 0.7 - 4.0 K/uL   Monocytes Absolute 0.5 0.1 - 1.0 K/uL   Eosinophils Absolute 0.1 0.0 - 0.7 K/uL   Basophils Absolute 0.0 0.0 - 0.1 K/uL  Microalbumin / creatinine urine ratio     Status: None   Collection Time: 12/21/22  2:02 PM  Result Value Ref Range   Microalb, Ur 0.9 0.0 - 1.9 mg/dL   Creatinine,U 952.8 mg/dL   Microalb  Creat Ratio 0.6 0.0 - 30.0 mg/g  Lipid panel     Status: None   Collection Time: 12/21/22  2:02 PM  Result Value Ref Range   Cholesterol 174 0 - 200 mg/dL    Comment: ATP III Classification       Desirable:  < 200 mg/dL               Borderline High:  200 - 239 mg/dL          High:  > = 413 mg/dL   Triglycerides 24.4 0.0 - 149.0 mg/dL    Comment: Normal:  <010 mg/dLBorderline High:  150 - 199 mg/dL   HDL 27.25 >36.64 mg/dL   VLDL 40.3 0.0 - 47.4 mg/dL   LDL Cholesterol 97 0 - 99 mg/dL   Total CHOL/HDL Ratio 3     Comment:  Men          Women1/2 Average Risk     3.4          3.3Average Risk          5.0          4.42X Average Risk          9.6          7.13X Average Risk          15.0          11.0                       NonHDL 113.14     Comment: NOTE:  Non-HDL goal should be 30 mg/dL higher than patient's LDL goal (i.e. LDL goal of < 70 mg/dL, would have non-HDL goal of < 100 mg/dL)  TSH     Status: None   Collection Time: 12/21/22  2:02 PM  Result Value Ref Range   TSH 1.38 0.35 - 5.50 uIU/mL        Garner Nash, MD, MS

## 2022-12-24 DIAGNOSIS — B351 Tinea unguium: Secondary | ICD-10-CM | POA: Insufficient documentation

## 2022-12-24 NOTE — Assessment & Plan Note (Addendum)
Improving but not at goal. Plan: Initiate tirzepatide 2.5 mg subcutaneous injection once weekly to improve glycemic control and provide cardiovascular benefits. Continue metformin 1,000 mg orally twice daily. Continue dapagliflozin 10 mg orally once daily. Educated patient on proper administration of injectable medication and potential side effects, including gastrointestinal upset and nausea. Encouraged patient to schedule an appointment with ophthalmology.

## 2022-12-24 NOTE — Assessment & Plan Note (Signed)
Refer to podiatry for evaluation and management of diabetic footcare, toenail fungal infection

## 2022-12-24 NOTE — Assessment & Plan Note (Signed)
Stable. Plan: Continue current antihypertensive therapy. Encourage lifestyle modifications, including diet and regular exercise.

## 2022-12-26 LAB — VITAMIN D 1,25 DIHYDROXY
Vitamin D 1, 25 (OH)2 Total: 56 pg/mL (ref 18–72)
Vitamin D2 1, 25 (OH)2: <8 pg/mL
Vitamin D3 1, 25 (OH)2: 56 pg/mL

## 2022-12-26 LAB — URINALYSIS W MICROSCOPIC + REFLEX CULTURE

## 2022-12-27 ENCOUNTER — Telehealth: Payer: Self-pay

## 2022-12-27 NOTE — Progress Notes (Signed)
   Care Guide Note  12/27/2022 Name: Stacey Fletcher MRN: [id number] DOB: 11/26/1950  Referred by: Garnette Gunner, MD Reason for referral : Care Coordination (Outreach to schedule with Pharm d )   Stacey Fletcher is a 73 y.o. year old female who is a primary care patient of Garnette Gunner, MD. Stacey Fletcher was referred to the pharmacist for assistance related to DM.    Successful contact was made with the patient to discuss pharmacy services including being ready for the pharmacist to call at least 5 minutes before the scheduled appointment time, to have medication bottles and any blood sugar or blood pressure readings ready for review. The patient agreed to meet with the pharmacist via with the pharmacist via telephone visit on (date/time).  01/02/2023  Penne Lash , RMA       [facility], [facility] Guide  Direct Dial: [phone]  Website: [email or web address]

## 2022-12-28 ENCOUNTER — Telehealth: Payer: Self-pay

## 2022-12-28 ENCOUNTER — Other Ambulatory Visit (HOSPITAL_COMMUNITY): Payer: Self-pay

## 2022-12-28 NOTE — Telephone Encounter (Signed)
Received fax request from pharmacy requesting PA for Dexcom g7 sensor. Please advise.

## 2022-12-28 NOTE — Telephone Encounter (Signed)
Pharmacy Patient Advocate Encounter   Received notification from Pt Calls Messages that prior authorization for Dexcom G7 Sensor is required/requested.   Insurance verification completed.   The patient is insured through [facility] .   Per test claim: PA required; PA submitted to above mentioned insurance via CoverMyMeds Key/confirmation #/EOC [id number] Status is pending

## 2023-01-01 ENCOUNTER — Other Ambulatory Visit: Payer: 59

## 2023-01-02 ENCOUNTER — Other Ambulatory Visit: Payer: Self-pay

## 2023-01-02 NOTE — Telephone Encounter (Signed)
Pharmacy Patient Advocate Encounter  Received notification from [facility] that Prior Authorization for Dexcom G7 sensor has been DENIED.  See denial reason below. No denial letter attached in CMM. Will attach denial letter to Media tab once received.   PA #/Case ID/Reference #: [id number]

## 2023-01-02 NOTE — Progress Notes (Addendum)
01/02/2023 Name: Stacey Fletcher MRN: [id number] DOB: 01/03/1951  Chief Complaint  Patient presents with   Diabetes Management Plan   Stacey Fletcher is a 72 y.o. year old female who presented for a telephone visit.   They were referred to the pharmacist by their PCP for assistance in managing diabetes.    Subjective:  Care Team: Primary Care Provider: Garnette Gunner, MD   Medication Access/Adherence  Current Pharmacy:  [facility] [address] [place] - [address] [place] [address] Phone: [phone] Fax: [phone]  [facility] [address] [place] [address] Phone: [phone] Fax: [phone]  [facility] [address] [place] - [address] [place] [address] Phone: [phone] Fax: [phone]  [facility] [address] [place] [address] Phone: [phone] Fax: [phone]  -Patient reports affordability concerns with their medications: Yes  -Patient reports access/transportation concerns to their pharmacy: No  -Patient reports adherence concerns with their medications:  No    Diabetes: Current medications: Mounjaro 2.5mg  weekly, Farxiga 10mg  daily, metformin 1000mg  daily -Medications tried in the past: Januvia caused itching, Jardiance caused UTI/yeast infections -Patient was recently prescribed Mounjaro and endorses she is tolerating this medication well so far -She does not check home BG, because she states she does not know how but does have testing supplies -Recently prescribed Dexcom CGM, but insurance denied prior authorization; because she is not insulin dependant or have documented hypoglycemic events with BG <54  Objective: Lab Results  Component Value Date   HGBA1C 7.2 (A) 12/21/2022   Lab Results  Component Value Date   CREATININE 0.81 12/21/2022   BUN 17 12/21/2022   NA 142  12/21/2022   K 3.9 12/21/2022   CL 106 12/21/2022   CO2 31 12/21/2022   Medications Reviewed Today     Reviewed by Lenna Gilford, RPH (Pharmacist) on 01/02/23 at 1523  Med List Status: <None>   Medication Order Taking? Sig Documenting Provider Last Dose Status Informant  aspirin 81 MG chewable tablet [id number] Yes Chew 81 mg by mouth in the morning. [provider] Taking Active Self  Azelastine-Fluticasone 137-50 MCG/ACT SUSP [id number] Yes Place 1 spray into the nose every 12 (twelve) hours. Garnette Gunner, MD Taking Active   buPROPion (WELLBUTRIN XL) 300 MG 24 hr tablet [id number] Yes Take 300 mg by mouth every morning. [provider] Taking Active Self  Continuous Glucose Receiver (DEXCOM G7 RECEIVER) DEVI [id number] No 1 each by Does not apply route as needed.  Patient not taking: Reported on 01/02/2023   Garnette Gunner, MD Not Taking Active   Continuous Glucose Sensor (DEXCOM G7 SENSOR) MISC [id number] No 1 each by Does not apply route every 14 (fourteen) days.  Patient not taking: Reported on 01/02/2023   Garnette Gunner, MD Not Taking Active   dapagliflozin propanediol (FARXIGA) 10 MG TABS tablet [id number] Yes Take 1 tablet (10 mg total) by mouth daily. Sharlene Dory, PA-C Taking Active Self  digoxin (LANOXIN) 0.125 MG tablet [id number] Yes Take 1 tablet (0.125 mg total) by mouth daily. Dorthula Nettles, DO Taking Active   diphenhydrAMINE (BENADRYL) 25 MG tablet [id number]  Take 25 mg by mouth at bedtime. [provider]  Active   furosemide (LASIX) 40 MG tablet [id number] Yes Take 1 tablet (40 mg total) by mouth every other day. Garnette Gunner, MD Taking Active   levothyroxine (  SYNTHROID) 25 MCG tablet [id number] Yes Take 25 mcg by mouth daily before breakfast. [provider] Taking Active Self  losartan (COZAAR) 100 MG tablet [id number] Yes Take 1 tablet (100 mg total) by mouth daily. Sabharwal, Aditya, DO Taking Active   losartan  (COZAAR) 50 MG tablet [id number] Yes Take 1 tablet (50 mg total) by mouth daily. Dorthula Nettles, DO Taking Active            Med Note Littie Deeds, Jackolyn Geron A   Wed Jan 02, 2023  3:22 PM) Takes long with 100mg   metFORMIN (GLUCOPHAGE) 1000 MG tablet [id number] Yes Take 1,000 mg by mouth daily. [provider] Taking Active Self  metoprolol succinate (TOPROL-XL) 50 MG 24 hr tablet [id number] Yes Take 3 tablets (150 mg total) by mouth daily. Take with or immediately following a meal. Sabharwal, Aditya, DO Taking Active   Potassium Chloride ER 20 MEQ TBCR [id number] Yes Take 1 tablet (20 mEq total) by mouth every other day. Dorthula Nettles, DO Taking Active   pravastatin (PRAVACHOL) 20 MG tablet [id number] Yes Take 20 mg by mouth in the morning. [provider] Taking Active Self  spironolactone (ALDACTONE) 25 MG tablet [id number] Yes Take 1 tablet (25 mg total) by mouth daily. Sharlene Dory, [place] Taking Active Self  tirzepatide [facility]) 2.5 MG/0.5ML Pen [id number] Yes Inject 2.5 mg into the skin once a week. Garnette Gunner, MD Taking Active   venlafaxine XR (EFFEXOR-XR) 75 MG 24 hr capsule [id number] Yes Take 75 mg by mouth in the morning, at noon, and at bedtime. [provider] Taking Active Self           Med Note Suezanne Cheshire   Fri Jul 13, 2022 10:29 AM)             Assessment/Plan:   Diabetes: -Currently uncontrolled with A1c >7% -Recommend patient continue current medication regimen at this time; we can discuss increasing Mounjaro dose in the future -In person appointment scheduled with patient to educate on home BG testing; she will be bringing her supplies in to practice with  Follow Up Plan: 12/3  Lenna Gilford, PharmD, DPLA

## 2023-01-08 ENCOUNTER — Other Ambulatory Visit: Payer: Self-pay

## 2023-01-19 ENCOUNTER — Other Ambulatory Visit (HOSPITAL_COMMUNITY): Payer: Self-pay

## 2023-01-22 ENCOUNTER — Other Ambulatory Visit: Payer: Self-pay

## 2023-01-22 ENCOUNTER — Other Ambulatory Visit (HOSPITAL_COMMUNITY): Payer: Self-pay

## 2023-01-22 DIAGNOSIS — I5042 Chronic combined systolic (congestive) and diastolic (congestive) heart failure: Secondary | ICD-10-CM

## 2023-01-22 MED ORDER — SPIRONOLACTONE 25 MG PO TABS: 25.0000 mg | ORAL_TABLET | Freq: Every day | ORAL | 0 refills | Status: DC

## 2023-02-11 ENCOUNTER — Telehealth: Payer: Self-pay

## 2023-02-11 NOTE — Progress Notes (Signed)
   02/11/2023  Patient ID: Stacey Fletcher, female   DOB: May 12, 1950, 73 y.o.   MRN: [id number]  Attempted to call patient to reschedule missed office visits for me to assist with diabetic testing supplies.  Called x3 and phone number is not currently working.  I am sending patient a MyChart message and will try to call again next week if I do not hear back.  Stacey Fletcher, PharmD, DPLA

## 2023-02-14 ENCOUNTER — Ambulatory Visit (INDEPENDENT_AMBULATORY_CARE_PROVIDER_SITE_OTHER): Payer: 59 | Admitting: Orthopedic Surgery

## 2023-02-14 DIAGNOSIS — M170 Bilateral primary osteoarthritis of knee: Secondary | ICD-10-CM | POA: Diagnosis not present

## 2023-02-15 ENCOUNTER — Encounter: Payer: Self-pay | Admitting: Orthopedic Surgery

## 2023-02-15 DIAGNOSIS — M170 Bilateral primary osteoarthritis of knee: Secondary | ICD-10-CM | POA: Diagnosis not present

## 2023-02-15 MED ORDER — LIDOCAINE HCL 1 % IJ SOLN
5.0000 mL | INTRAMUSCULAR | Status: AC | PRN
Start: 2023-02-15 — End: 2023-02-15
  Administered 2023-02-15: 5 mL

## 2023-02-15 MED ORDER — METHYLPREDNISOLONE ACETATE 40 MG/ML IJ SUSP
40.0000 mg | INTRAMUSCULAR | Status: AC | PRN
Start: 2023-02-15 — End: 2023-02-15
  Administered 2023-02-15: 40 mg via INTRA_ARTICULAR

## 2023-02-15 NOTE — Progress Notes (Signed)
 Office Visit Note   Patient: Stacey Fletcher           Date of Birth: 01-02-1951           MRN: [id number] Visit Date: 02/14/2023              Requested by: Sebastian Beverley NOVAK, MD [address] [place],  [place] [address] PCP: Sebastian Beverley NOVAK, MD  Chief Complaint  Patient presents with   Other    Bilateral knee pain requesting bilateral knee cortisone injections      HPI: Patient is a 73 year old woman who presents in follow-up for osteoarthritis both knees.  Patient states she has had several months of pain relief from previous steroid injections.  Assessment & Plan: Visit Diagnoses:  1. Bilateral primary osteoarthritis of knee     Plan: Both knees were injected she tolerated this well.  Follow-Up Instructions: Return if symptoms worsen or fail to improve.   Ortho Exam  Patient is alert, oriented, no adenopathy, well-dressed, normal affect, normal respiratory effort. Examination patient has a mild effusion of both knees Clausing cruciates are stable bilaterally.  She has crepitation with range of motion of both knees and is tender to palpation over the medial lateral joint line as well as the patellofemoral joint.  Imaging: No results found. No images are attached to the encounter.  Labs: Lab Results  Component Value Date   HGBA1C 7.2 (A) 12/21/2022   HGBA1C 7.4 (A) 09/07/2022   HGBA1C 6.7 (H) 07/20/2021   ESRSEDRATE 13 08/03/2009   REPTSTATUS 05/11/2013 FINAL 05/07/2013   CULT  05/07/2013    NO SALMONELLA, SHIGELLA, CAMPYLOBACTER, YERSINIA, OR E.COLI 0157:H7 ISOLATED Performed at [employer] [facility] ESCHERICHIA COLI 05/06/2013     Lab Results  Component Value Date   ALBUMIN 4.2 12/21/2022   ALBUMIN 3.8 08/10/2021   ALBUMIN 3.7 05/02/2020    Lab Results  Component Value Date   MG 1.9 07/20/2021   MG 1.7 05/06/2013   MG 1.9 10/20/2011   Lab Results  Component Value Date   VD25OH 30 03/01/2009    No results found for:  PREALBUMIN    Latest Ref Rng & Units 12/21/2022    2:02 PM 11/06/2022   11:49 PM 07/23/2021    2:31 AM  CBC EXTENDED  WBC 4.0 - 10.5 K/uL 7.3  10.0  8.6   RBC 3.87 - 5.11 Mil/uL 4.91  5.25  4.93   Hemoglobin 12.0 - 15.0 g/dL 86.7  86.1  87.4   HCT 36.0 - 46.0 % 41.8  44.6  39.7   Platelets 150.0 - 400.0 K/uL 221.0  265  241   NEUT# 1.4 - 7.7 K/uL 4.6     Lymph# 0.7 - 4.0 K/uL 2.1        There is no height or weight on file to calculate BMI.  Orders:  No orders of the defined types were placed in this encounter.  No orders of the defined types were placed in this encounter.    Procedures: Large Joint Inj: bilateral knee on 02/15/2023 3:14 PM Indications: pain and diagnostic evaluation Details: 22 G 1.5 in needle, anteromedial approach  Arthrogram: No  Medications (Right): 5 mL lidocaine  1 %; 40 mg methylPREDNISolone  acetate 40 MG/ML Medications (Left): 5 mL lidocaine  1 %; 40 mg methylPREDNISolone  acetate 40 MG/ML Outcome: tolerated well, no immediate complications Procedure, treatment alternatives, risks and benefits explained, specific risks discussed. Consent was given by the patient. Immediately prior to procedure  a time out was called to verify the correct patient, procedure, equipment, support staff and site/side marked as required. Patient was prepped and draped in the usual sterile fashion.      Clinical Data: No additional findings.  ROS:  All other systems negative, except as noted in the HPI. Review of Systems  Objective: Vital Signs: There were no vitals taken for this visit.  Specialty Comments:  No specialty comments available.  PMFS History: Patient Active Problem List   Diagnosis Date Noted   Onychomycosis 12/24/2022   Allergic rhinitis due to allergen 11/20/2022   Chronic combined systolic and diastolic heart failure (HCC) 09/10/2022   Severe mitral regurgitation 12/14/2021   Elevated troponin 07/20/2021   Acute CHF (congestive heart  failure) (HCC) 07/20/2021   Pneumonia    Osteoarthritis    MI (myocardial infarction) (HCC)    Hypertension    Goiter    Fibromyalgia    Depression    Coronary artery disease    Arthritis of carpometacarpal (CMC) joint of right thumb 07/15/2018   Pain of right hand 07/15/2018   ARF (acute renal failure) (HCC) 08/20/2014   Chest pain 05/06/2013   Nausea vomiting and diarrhea 05/06/2013   Fever 05/06/2013   HTN (hypertension) 05/06/2013   Hypothyroidism 05/06/2013   Diabetes mellitus (HCC) 05/06/2013   Past Medical History:  Diagnosis Date   Coronary artery disease    Depression    Diabetes mellitus    Fibromyalgia    Goiter    Hypertension    MI (myocardial infarction) (HCC)    Osteoarthritis    Pneumonia    10+ years ago.    Family History  Problem Relation Age of Onset   Hypertension Mother    Diabetes Mother    Stroke Mother    Hypertension Other    Diabetes Other     Past Surgical History:  Procedure Laterality Date   ABDOMINAL HYSTERECTOMY     BREAST SURGERY     BUBBLE STUDY  12/14/2021   Procedure: BUBBLE STUDY;  Surgeon: Santo Stanly LABOR, MD;  Location: [facility] ENDOSCOPY;  Service: Cardiovascular;;   CARDIAC CATHETERIZATION     1995   REDUCTION MAMMAPLASTY Bilateral    RIGHT/LEFT HEART CATH AND CORONARY ANGIOGRAPHY N/A 07/21/2021   Procedure: RIGHT/LEFT HEART CATH AND CORONARY ANGIOGRAPHY;  Surgeon: Court Dorn PARAS, MD;  Location: [facility] INVASIVE CV LAB;  Service: Cardiovascular;  Laterality: N/A;   TEE WITHOUT CARDIOVERSION N/A 12/14/2021   Procedure: TRANSESOPHAGEAL ECHOCARDIOGRAM (TEE);  Surgeon: Santo Stanly LABOR, MD;  Location: [facility] ENDOSCOPY;  Service: Cardiovascular;  Laterality: N/A;   Social History   Occupational History   Occupation: retired  Tobacco Use   Smoking status: Former    Current packs/day: 0.00    Types: Cigarettes    Quit date: 07/07/2018    Years since quitting: 4.6    Passive exposure: Never   Smokeless tobacco: Never   Vaping Use   Vaping status: Never Used  Substance and Sexual Activity   Alcohol use: No   Drug use: No   Sexual activity: Not Currently

## 2023-02-16 ENCOUNTER — Other Ambulatory Visit (HOSPITAL_COMMUNITY): Payer: Self-pay | Admitting: Cardiology

## 2023-02-16 DIAGNOSIS — I5042 Chronic combined systolic (congestive) and diastolic (congestive) heart failure: Secondary | ICD-10-CM

## 2023-02-18 ENCOUNTER — Other Ambulatory Visit: Payer: Self-pay | Admitting: Family Medicine

## 2023-02-18 DIAGNOSIS — E11628 Type 2 diabetes mellitus with other skin complications: Secondary | ICD-10-CM

## 2023-02-18 DIAGNOSIS — N1831 Chronic kidney disease, stage 3a: Secondary | ICD-10-CM

## 2023-02-21 ENCOUNTER — Telehealth: Payer: Self-pay | Admitting: Physician Assistant

## 2023-02-21 ENCOUNTER — Other Ambulatory Visit: Payer: Self-pay | Admitting: Physician Assistant

## 2023-02-21 DIAGNOSIS — I5042 Chronic combined systolic (congestive) and diastolic (congestive) heart failure: Secondary | ICD-10-CM

## 2023-02-21 MED ORDER — FUROSEMIDE 40 MG PO TABS
40.0000 mg | ORAL_TABLET | Freq: Every day | ORAL | 1 refills | Status: DC
Start: 2023-02-21 — End: 2023-08-22

## 2023-02-21 NOTE — Telephone Encounter (Signed)
  [facility]   MULTIDISCIPLINARY HEART VALVE TEAM   Pt called in to our office because she couldn't remember the heart failure clinic number and ran out of lasix. I did call her in a RX but she will need to be seen as she is overdue for a visit. I gave her the number but will CC Dr. Nyoka Cowden RN to reach out because the patient has memory issues.   Cline Crock PA-C  [facility]

## 2023-02-26 ENCOUNTER — Telehealth: Payer: Self-pay

## 2023-02-26 NOTE — Progress Notes (Signed)
   02/26/2023  Patient ID: Stacey Fletcher, female   DOB: Apr 07, 1950, 73 y.o.   MRN: [id number]  Outreach attempt to check on management of blood glucose and to see if patient would like to reschedule in person visit to assist with diabetic testing supplies.  I was not able to reach the patient nor was I able to leave a voicemail.  Sending patient a MyChart message, and I will try to contact her again next week if I do not hear back.  Lenna Gilford, PharmD, DPLA

## 2023-03-06 ENCOUNTER — Telehealth: Payer: Self-pay

## 2023-03-06 NOTE — Progress Notes (Signed)
   03/06/2023  Patient ID: Stacey Fletcher, female   DOB: 01/09/1951, 73 y.o.   MRN: [id number]  Outreach attempt to follow-up on management of diabetes was unsuccessful.  Phone number listed on patient's profile is still no longer working.  I will try to contact the patient again in another 1 to 2 weeks; and if I am not able to reach her, I will attempt to contact daughter listed on DPR to see if patient has a new phone number.  Lenna Gilford, PharmD, DPLA   .

## 2023-03-15 ENCOUNTER — Other Ambulatory Visit (HOSPITAL_COMMUNITY): Payer: Self-pay | Admitting: Cardiology

## 2023-03-15 DIAGNOSIS — I5042 Chronic combined systolic (congestive) and diastolic (congestive) heart failure: Secondary | ICD-10-CM

## 2023-03-19 ENCOUNTER — Telehealth: Payer: Self-pay

## 2023-03-19 NOTE — Progress Notes (Signed)
   03/19/2023  Patient ID: Stacey Fletcher, female   DOB: July 11, 1950, 73 y.o.   MRN: [id number]  Out reach attempt to follow-up with patient regarding management of diabetes continues to be unsuccessful, as number on file is no longer working.  Attempted to contact patient's daughter (DPR), but I was only able to leave a voicemail.  I did provide my direct phone number, though; so she can reach back out to me at her convenience.  Lenna Gilford, PharmD, DPLA

## 2023-03-25 NOTE — Progress Notes (Incomplete)
ADVANCED HEART FAILURE CLINIC NOTE  Referring Physician: Garnette Gunner, MD  Primary Care: Garnette Gunner, MD Primary HF: Dr. Gasper Lloyd  HPI: Stacey Fletcher is a 73 y.o. female with  HTN, T2DM, PTSD from son's death and diagnosis of HFrEF made last summer. In June of 2023, she reports waking up in her sleep with severe respiratory distress after a few weeks of worsening DOE. She was admitted to [facility] where she was found to have an LVEF of 30-35%; she was diuresed with IV lasix and discharged on low dose GDMT. She had a subsequent TTE in September 2023 w/ worsening LV function (LVEF 20-25%) and moderate to severe MR. Since that time she has also undergone TEE confirming presence of moderate to severe MR for which she has seen structural cardiology. Otherwise, Stacey Fletcher attempts to be as active as possible. She feels very limited by right knee osteoarthritis for which she is seeing orthopedic surgery. She is originally from [place] but moved to [place] after the murder of her son. She has excellent support here from her 2nd son.   Since starting GDMT, Stacey Fletcher has had a significant improvement in her functional status.  She no longer has PND, dyspnea or lower extremity edema.  She is now only limited by knee arthritis.  Interval hx: From a heart failure standpoint she is doing remarkably well.  No further episodes of PND, dyspnea or lower extremity edema.  She can go to the grocery store and do work around her home however is now only limited by severe bilateral knee arthritis for which she follows orthopedic surgery.  Activity level/exercise tolerance:  NYHA IIB, limited by significant knee osteoarthritis Orthopnea:  Sleeps on 2 pillows Paroxysmal noctural dyspnea:  infrequent Chest pain/pressure:  yes Orthostatic lightheadedness:  no Palpitations:  no Lower extremity edema:  no Presyncope/syncope:  no Cough:  no  Current Outpatient Medications  Medication Sig Dispense  Refill   aspirin 81 MG chewable tablet Chew 81 mg by mouth in the morning.     Azelastine-Fluticasone 137-50 MCG/ACT SUSP Place 1 spray into the nose every 12 (twelve) hours. 23 g 11   buPROPion (WELLBUTRIN XL) 300 MG 24 hr tablet Take 300 mg by mouth every morning.     dapagliflozin propanediol (FARXIGA) 10 MG TABS tablet Take 1 tablet (10 mg total) by mouth daily. 90 tablet 3   digoxin (LANOXIN) 0.125 MG tablet Take 1 tablet (0.125 mg total) by mouth daily. 90 tablet 3   diphenhydrAMINE (BENADRYL) 25 MG tablet Take 25 mg by mouth at bedtime.     furosemide (LASIX) 40 MG tablet Take 1 tablet (40 mg total) by mouth daily. 90 tablet 1   levothyroxine (SYNTHROID) 25 MCG tablet Take 25 mcg by mouth daily before breakfast.     losartan (COZAAR) 100 MG tablet Take 1 tablet (100 mg total) by mouth daily. 90 tablet 3   losartan (COZAAR) 50 MG tablet Take 1 tablet (50 mg total) by mouth daily. 90 tablet 3   metFORMIN (GLUCOPHAGE) 1000 MG tablet Take 1,000 mg by mouth daily.     metoprolol succinate (TOPROL-XL) 50 MG 24 hr tablet Take 3 tablets (150 mg total) by mouth daily. Take with or immediately following a meal. 90 tablet 3   MOUNJARO 2.5 MG/0.5ML Pen INJECT 2.5 MG INTO THE SKIN ONCE A WEEK 4 mL 0   Potassium Chloride ER 20 MEQ TBCR Take 1 tablet (20 mEq total) by mouth every other day. 45  tablet 3   pravastatin (PRAVACHOL) 20 MG tablet Take 20 mg by mouth in the morning.     spironolactone (ALDACTONE) 25 MG tablet Take 1 tablet by mouth once daily 30 tablet 0   venlafaxine XR (EFFEXOR-XR) 75 MG 24 hr capsule Take 75 mg by mouth in the morning, at noon, and at bedtime.     No current facility-administered medications for this visit.    PHYSICAL EXAM: There were no vitals filed for this visit. GENERAL: NAD Lungs- *** CARDIAC:  JVP: *** cm          Normal rate with regular rhythm. *** murmur.  Pulses ***. *** edema.  ABDOMEN: Soft, non-tender, non-distended.  EXTREMITIES: Warm and well  perfused.  NEUROLOGIC: No obvious FND    DATA REVIEW  ECG: 01/09/22: NSR   ECHO: 02/20/21: LVEF 20%, moderate MR 10/24/21: LVEF 20-25%, mod-severe MR 07/20/21: LVEF 30-35%, Grade II DD, mild MR 02/20/22: LVEF 30%, grade 1 diastolic dysfunction, moderate mitral regurgitation.  CMR: 02/20/21 1. Severely dilated LV, severely reduced LV systolic function, LVEF 14%.  2. There is a thin segment of late gadolinium enhancement in the left ventricular myocardium septal mid wall.  3.  Mild mitral regurgitation.  4.  Normal RV size and function.  5.  Findings consistent with nonischemic dilated cardiomyopathy.  CATH: 07/21/21 No CAD 1: Right atrial pressure-14/9, mean 9 2: Right ventricular pressure-50/7 3: Pulmonary artery pressure-48/27, mean 36 4: Pulmonary wedge pressure-A-wave 35, V wave 43, mean 36 5: LVEDP-33 6: Cardiac output-5.39 L/min with an index of 2.46 L/min/m    ASSESSMENT & PLAN:  Heart failure with reduced EF Etiology of EA:[id number]; cardiac PET ordered NYHA class / AHA Stage: Significant improvement after uptitrating medical therapy and starting physical therapy. Now likely NYHA IIB. Volume status & Diuretics: Euvolemic, taking lasix 40mg  every other day.  Vasodilators: losartan 150mg  daily.  Beta-Blocker:Toprol 100mg  daily; continue digoxin daily.  Repeat digoxin level today WGN:FAOZHYQMVHQION 25mg  daily Cardiometabolic:farxiga 10mg  daily Devices therapies & Valvulopathies: Repeat echocardiogram, if function remains low will plan for primary prevention ICD. Advanced therapies: When we first starting managing Stacey Fletcher she was unable to walk more than 15-20 ft and had a fairly poor baseline functional status. We have slowly uptitrated meds and started physical therapy. She is now able to work around the home and will try to mow her yard this month. She had a CPX that does not demonstrate significant cardiac limitations at this point.  Repeat echo, if  function is low plan for cardiac PET and/or EP referral for primary prevention ICD.  2. Moderate Mitral regurgitation  -Appears to have improved after starting GDMT.  Will repeat echo to monitor.  If she does have severe MR plan to refer to structural cardiology.  3. HTN - well controlled now, continue losartan 150 mg daily.  Repeat labs today.  Yousra Ivens Advanced Heart Failure Mechanical Circulatory Support

## 2023-03-26 ENCOUNTER — Encounter (HOSPITAL_COMMUNITY): Payer: 59 | Admitting: Cardiology

## 2023-03-28 ENCOUNTER — Encounter (HOSPITAL_COMMUNITY): Payer: Self-pay

## 2023-03-28 ENCOUNTER — Telehealth (HOSPITAL_COMMUNITY): Payer: Self-pay | Admitting: Cardiology

## 2023-03-28 NOTE — Telephone Encounter (Signed)
Called patient at [phone] to remind patient of her appt on Friday2/21/25 at 11:20 AM with Dr. Gasper Lloyd.   Telephone call unable to go through states "the number you have dialed is not in service".   Front office will try to message patient via MyChart to remind her of her appointment.

## 2023-03-29 ENCOUNTER — Other Ambulatory Visit (HOSPITAL_COMMUNITY): Payer: Self-pay | Admitting: Cardiology

## 2023-03-29 ENCOUNTER — Ambulatory Visit (HOSPITAL_COMMUNITY)
Admission: RE | Admit: 2023-03-29 | Discharge: 2023-03-29 | Disposition: A | Discharge status: Home / Self Care | Payer: 59 | Source: Ambulatory Visit | Attending: Cardiology | Admitting: Cardiology

## 2023-03-29 VITALS — BP 110/74 | HR 82 | Wt 194.8 lb

## 2023-03-29 DIAGNOSIS — Z5181 Encounter for therapeutic drug level monitoring: Secondary | ICD-10-CM

## 2023-03-29 DIAGNOSIS — Z79899 Other long term (current) drug therapy: Secondary | ICD-10-CM | POA: Diagnosis not present

## 2023-03-29 DIAGNOSIS — Z7984 Long term (current) use of oral hypoglycemic drugs: Secondary | ICD-10-CM | POA: Insufficient documentation

## 2023-03-29 DIAGNOSIS — I10 Essential (primary) hypertension: Secondary | ICD-10-CM | POA: Diagnosis not present

## 2023-03-29 DIAGNOSIS — M1711 Unilateral primary osteoarthritis, right knee: Secondary | ICD-10-CM | POA: Diagnosis not present

## 2023-03-29 DIAGNOSIS — Z7985 Long-term (current) use of injectable non-insulin antidiabetic drugs: Secondary | ICD-10-CM | POA: Insufficient documentation

## 2023-03-29 DIAGNOSIS — I110 Hypertensive heart disease with heart failure: Secondary | ICD-10-CM | POA: Diagnosis not present

## 2023-03-29 DIAGNOSIS — I340 Nonrheumatic mitral (valve) insufficiency: Secondary | ICD-10-CM | POA: Insufficient documentation

## 2023-03-29 DIAGNOSIS — E119 Type 2 diabetes mellitus without complications: Secondary | ICD-10-CM | POA: Insufficient documentation

## 2023-03-29 DIAGNOSIS — I5042 Chronic combined systolic (congestive) and diastolic (congestive) heart failure: Secondary | ICD-10-CM | POA: Diagnosis not present

## 2023-03-29 DIAGNOSIS — F4310 Post-traumatic stress disorder, unspecified: Secondary | ICD-10-CM | POA: Diagnosis not present

## 2023-03-29 LAB — LIPID PANEL
Cholesterol: 170 mg/dL (ref 0–200)
HDL: 69 mg/dL (ref >40)
LDL Cholesterol: 88 mg/dL (ref 0–99)
Total CHOL/HDL Ratio: 2.5 {ratio}
Triglycerides: 66 mg/dL (ref <150)
VLDL: 13 mg/dL (ref 0–40)

## 2023-03-29 LAB — BRAIN NATRIURETIC PEPTIDE: B Natriuretic Peptide: 21.7 pg/mL (ref 0.0–100.0)

## 2023-03-29 LAB — BASIC METABOLIC PANEL
Anion gap: 8 (ref 5–15)
BUN: 18 mg/dL (ref 8–23)
CO2: 29 mmol/L (ref 22–32)
Calcium: 9.7 mg/dL (ref 8.9–10.3)
Chloride: 103 mmol/L (ref 98–111)
Creatinine, Ser: 1.04 mg/dL — ABNORMAL HIGH (ref 0.44–1.00)
GFR, Estimated: 56 mL/min — ABNORMAL LOW (ref >60)
Glucose, Bld: 110 mg/dL — ABNORMAL HIGH (ref 70–99)
Potassium: 4 mmol/L (ref 3.5–5.1)
Sodium: 140 mmol/L (ref 135–145)

## 2023-03-29 LAB — DIGOXIN LEVEL: Digoxin Level: 0.5 ng/mL — ABNORMAL LOW (ref 0.8–2.0)

## 2023-03-29 MED ORDER — DIGOXIN 125 MCG PO TABS: 0.0625 mg | ORAL_TABLET | Freq: Every day | ORAL | 3 refills | Status: DC

## 2023-03-29 MED ORDER — POTASSIUM CHLORIDE ER 20 MEQ PO TBCR: 1.0000 | EXTENDED_RELEASE_TABLET | ORAL | 3 refills | Status: DC

## 2023-03-29 NOTE — Progress Notes (Signed)
ADVANCED HEART FAILURE CLINIC NOTE  Referring Physician: Garnette Gunner, MD  Primary Care: Garnette Gunner, MD Primary HF: Dr. Gasper Lloyd  HPI: Stacey Fletcher is a 73 y.o. female with  HTN, T2DM, PTSD from son's death and diagnosis of HFrEF made last summer. In June of 2023, she reports waking up in her sleep with severe respiratory distress after a few weeks of worsening DOE. She was admitted to [facility] where she was found to have an LVEF of 30-35%; she was diuresed with IV lasix and discharged on low dose GDMT. She had a subsequent TTE in September 2023 w/ worsening LV function (LVEF 20-25%) and moderate to severe MR. Since that time she has also undergone TEE confirming presence of moderate to severe MR for which she has seen structural cardiology. Otherwise, Ms. Suire attempts to be as active as possible. She feels very limited by right knee osteoarthritis for which she is seeing orthopedic surgery. She is originally from [place] but moved to [place] after the murder of her son. She has excellent support here from her 2nd son.   Since starting GDMT, Stacey Fletcher has had a significant improvement in her functional status.  She no longer has PND, dyspnea or lower extremity edema.  She is now only limited by knee arthritis.  Interval hx: - Today she reports a complete reversal of her symptoms; during our prior appointments she was in a wheelchair due to shortness of breath and severe knee arthritis. She is now walking, has no dyspnea/PND/lightheadedness; NYHA I-II functional class.  - compliant with all medications.   Activity level/exercise tolerance:  NYHA I-II Orthopnea:  Sleeps on 2 pillows Paroxysmal noctural dyspnea:  infrequent Chest pain/pressure:  yes Orthostatic lightheadedness:  no Palpitations:  no Lower extremity edema:  no Presyncope/syncope:  no Cough:  no  Current Outpatient Medications  Medication Sig Dispense Refill   aspirin 81 MG chewable tablet Chew 81 mg  by mouth in the morning.     Azelastine-Fluticasone 137-50 MCG/ACT SUSP Place 1 spray into the nose every 12 (twelve) hours. 23 g 11   buPROPion (WELLBUTRIN XL) 300 MG 24 hr tablet Take 300 mg by mouth every morning.     dapagliflozin propanediol (FARXIGA) 10 MG TABS tablet Take 1 tablet (10 mg total) by mouth daily. 90 tablet 3   diphenhydrAMINE (BENADRYL) 25 MG tablet Take 25 mg by mouth at bedtime.     furosemide (LASIX) 40 MG tablet Take 1 tablet (40 mg total) by mouth daily. 90 tablet 1   levothyroxine (SYNTHROID) 25 MCG tablet Take 25 mcg by mouth daily before breakfast.     losartan (COZAAR) 100 MG tablet Take 1 tablet (100 mg total) by mouth daily. 90 tablet 3   losartan (COZAAR) 50 MG tablet Take 1 tablet (50 mg total) by mouth daily. 90 tablet 3   metFORMIN (GLUCOPHAGE) 1000 MG tablet Take 1,000 mg by mouth daily.     metoprolol succinate (TOPROL-XL) 50 MG 24 hr tablet Take 3 tablets (150 mg total) by mouth daily. Take with or immediately following a meal. 90 tablet 3   MOUNJARO 2.5 MG/0.5ML Pen INJECT 2.5 MG INTO THE SKIN ONCE A WEEK (Patient taking differently: On Mondays) 4 mL 0   pravastatin (PRAVACHOL) 20 MG tablet Take 20 mg by mouth in the morning.     spironolactone (ALDACTONE) 25 MG tablet Take 1 tablet by mouth once daily 30 tablet 0   venlafaxine XR (EFFEXOR-XR) 75 MG 24 hr capsule  Take 75 mg by mouth in the morning, at noon, and at bedtime.     digoxin (LANOXIN) 0.125 MG tablet Take 0.5 tablets (0.0625 mg total) by mouth daily. 45 tablet 3   Potassium Chloride ER 20 MEQ TBCR Take 1 tablet (20 mEq total) by mouth every other day. 45 tablet 3   No current facility-administered medications for this encounter.    PHYSICAL EXAM: Vitals:   03/29/23 1122  BP: 110/74  Pulse: 82  SpO2: 98%   GENERAL: NAD Lungs- CTA B/L CARDIAC:  JVP: 6 cm          Normal rate with regular rhythm. No murmur.  Pulses 2+. No edema.  ABDOMEN: Soft, non-tender, non-distended.  EXTREMITIES:  Warm and well perfused.  NEUROLOGIC: No obvious FND   DATA REVIEW  ECG: 01/09/22: NSR   ECHO: 02/20/21: LVEF 20%, moderate MR 10/24/21: LVEF 20-25%, mod-severe MR 07/20/21: LVEF 30-35%, Grade II DD, mild MR 02/20/22: LVEF 30%, grade 1 diastolic dysfunction, moderate mitral regurgitation.  CMR: 02/20/21 1. Severely dilated LV, severely reduced LV systolic function, LVEF 14%.  2. There is a thin segment of late gadolinium enhancement in the left ventricular myocardium septal mid wall.  3.  Mild mitral regurgitation.  4.  Normal RV size and function.  5.  Findings consistent with nonischemic dilated cardiomyopathy.  CATH: 07/21/21 No CAD 1: Right atrial pressure-14/9, mean 9 2: Right ventricular pressure-50/7 3: Pulmonary artery pressure-48/27, mean 36 4: Pulmonary wedge pressure-A-wave 35, V wave 43, mean 36 5: LVEDP-33 6: Cardiac output-5.39 L/min with an index of 2.46 L/min/m    ASSESSMENT & PLAN:  Heart failure with reduced EF Etiology of UJ:[id number]; cardiac PET ordered NYHA class / AHA Stage: Significant improvement after uptitrating medical therapy and starting physical therapy. Now likely NYHA IIB. Volume status & Diuretics: Euvolemic, taking lasix 40mg  every other day. Repeat BMP today; plan to D/C KCL. She has been off of it for 1 week now.  Vasodilators: losartan 150mg  daily.  Beta-Blocker:Toprol 100mg  daily; repeat digoxin level; reduce dig to 0.0639mcg. Plan to wean off at pharmD visit.  AOZ:HYQMVHQIONGEXB 25mg  daily Cardiometabolic:farxiga 10mg  daily Devices therapies & Valvulopathies: Repeat TTE when available.  Advanced therapies: When we first starting managing Stacey Fletcher she was unable to walk more than 15-20 ft and had a fairly poor baseline functional status. We have slowly uptitrated meds and started physical therapy. She is now able to work around the home and will try to mow her yard this month. She had a CPX that does not demonstrate significant  cardiac limitations at this point.  Repeat echo, if function is low plan for cardiac PET and/or EP referral for primary prevention ICD. 03/29/23: After completing cardiac rehab she reports improvement in her knee arthritis and overall functional status.   2. Moderate Mitral regurgitation  -Appears to have improved after starting GDMT.  Will repeat echo to monitor.  If she does have severe MR plan to refer to structural cardiology.  3. HTN - well controlled now, continue losartan 150 mg daily.  Repeat labs today.   Stacey Fletcher Advanced Heart Failure Mechanical Circulatory Support

## 2023-03-29 NOTE — Patient Instructions (Signed)
Medication Changes:  DECREASE DIGOXIN TO 0.0625MG  ONCE DAILY   Lab Work:  Labs done today, your results will be available in MyChart, we will contact you for abnormal readings.  Testing/Procedures:  ECHO AS SCHEDULED   Follow-Up in: 2 MONTHS WITH PHARMACY AS SCHEDULED   THEN AGAIN IN 3 MONTHS WITH DR. Gasper Lloyd PLEASE CALL OUR OFFICE AROUND MID MARCH TO GET SCHEDULED FOR YOUR APPOINTMENT. PHONE NUMBER IS [phone] OPTION 2   At the [facility], you and your health needs are our priority. We have a designated team specialized in the treatment of Heart Failure. This Care Team includes your primary Heart Failure Specialized Cardiologist (physician), Advanced Practice Providers (APPs- Physician Assistants and Nurse Practitioners), and Pharmacist who all work together to provide you with the care you need, when you need it.   You may see any of the following providers on your designated Care Team at your next follow up:  Dr. Arvilla Meres Dr. Marca Ancona Dr. Dorthula Nettles Dr. Theresia Bough Tonye Becket, NP Robbie Lis, [place] [facility], [place] Brynda Peon, NP [place] Lee, NP Karle Plumber, PharmD   Please be sure to bring in all your medications bottles to every appointment.   Need to Contact us:  If you have any questions or concerns before your next appointment please send [place] a message through [place] or call our office at [phone].    TO LEAVE A MESSAGE FOR THE NURSE SELECT OPTION 2, PLEASE LEAVE A MESSAGE INCLUDING: YOUR NAME DATE OF BIRTH CALL BACK NUMBER REASON FOR CALL**this is important as we prioritize the call backs  YOU WILL RECEIVE A CALL BACK THE SAME DAY AS LONG AS YOU CALL BEFORE 4:00 PM

## 2023-03-31 ENCOUNTER — Other Ambulatory Visit: Payer: Self-pay | Admitting: Family Medicine

## 2023-03-31 ENCOUNTER — Other Ambulatory Visit (HOSPITAL_COMMUNITY): Payer: Self-pay | Admitting: Cardiology

## 2023-03-31 DIAGNOSIS — E11628 Type 2 diabetes mellitus with other skin complications: Secondary | ICD-10-CM

## 2023-03-31 DIAGNOSIS — I5042 Chronic combined systolic (congestive) and diastolic (congestive) heart failure: Secondary | ICD-10-CM

## 2023-03-31 DIAGNOSIS — N1831 Chronic kidney disease, stage 3a: Secondary | ICD-10-CM

## 2023-04-01 MED ORDER — MOUNJARO 2.5 MG/0.5ML ~~LOC~~ SOAJ
SUBCUTANEOUS | 0 refills | Status: DC
Start: 2023-04-01 — End: 2023-04-23

## 2023-04-01 MED ORDER — SPIRONOLACTONE 25 MG PO TABS: 25.0000 mg | ORAL_TABLET | Freq: Every day | ORAL | 0 refills | Status: DC

## 2023-04-01 MED ORDER — BUPROPION HCL ER (XL) 300 MG PO TB24: 300.0000 mg | ORAL_TABLET | Freq: Every morning | ORAL | 1 refills | Status: DC

## 2023-04-02 ENCOUNTER — Telehealth: Payer: Self-pay

## 2023-04-02 ENCOUNTER — Other Ambulatory Visit (HOSPITAL_COMMUNITY): Payer: Self-pay

## 2023-04-02 DIAGNOSIS — J301 Allergic rhinitis due to pollen: Secondary | ICD-10-CM

## 2023-04-02 DIAGNOSIS — I5042 Chronic combined systolic (congestive) and diastolic (congestive) heart failure: Secondary | ICD-10-CM

## 2023-04-02 MED ORDER — SPIRONOLACTONE 25 MG PO TABS: 25.0000 mg | ORAL_TABLET | Freq: Every day | ORAL | 5 refills | Status: DC

## 2023-04-02 MED ORDER — RYALTRIS 665-25 MCG/ACT NA SUSP
2.0000 | Freq: Two times a day (BID) | NASAL | 3 refills | Status: DC | PRN
  Filled 2023-04-02: qty 29, fill #0

## 2023-04-02 NOTE — Telephone Encounter (Signed)
 Incoming mail stating that Azelastine-Fluticasone 137-50 MCG/ACT SUSP isn't covered by insurance. They supplied patient with temporary supply on 03/25/2023. They stated that they will cover dymista, ryaltris,azelastine 0.1%, and fluticasone susp. Please advise.

## 2023-04-02 NOTE — Addendum Note (Signed)
 Addended by: Garnette Gunner on: 04/02/2023 04:25 PM   Modules accepted: Orders

## 2023-04-16 ENCOUNTER — Ambulatory Visit (HOSPITAL_COMMUNITY): Admission: RE | Admit: 2023-04-16 | Payer: 59 | Source: Ambulatory Visit

## 2023-04-23 ENCOUNTER — Other Ambulatory Visit: Payer: Self-pay | Admitting: Family Medicine

## 2023-04-23 DIAGNOSIS — E11628 Type 2 diabetes mellitus with other skin complications: Secondary | ICD-10-CM

## 2023-04-23 DIAGNOSIS — N1831 Chronic kidney disease, stage 3a: Secondary | ICD-10-CM

## 2023-04-24 ENCOUNTER — Ambulatory Visit (HOSPITAL_COMMUNITY)
Admission: RE | Admit: 2023-04-24 | Discharge: 2023-04-24 | Disposition: A | Discharge status: Home / Self Care | Source: Ambulatory Visit | Attending: Cardiology | Admitting: Cardiology

## 2023-04-24 DIAGNOSIS — E119 Type 2 diabetes mellitus without complications: Secondary | ICD-10-CM | POA: Insufficient documentation

## 2023-04-24 DIAGNOSIS — I5042 Chronic combined systolic (congestive) and diastolic (congestive) heart failure: Secondary | ICD-10-CM | POA: Diagnosis not present

## 2023-04-24 DIAGNOSIS — I2510 Atherosclerotic heart disease of native coronary artery without angina pectoris: Secondary | ICD-10-CM | POA: Insufficient documentation

## 2023-04-24 DIAGNOSIS — I110 Hypertensive heart disease with heart failure: Secondary | ICD-10-CM | POA: Insufficient documentation

## 2023-04-24 DIAGNOSIS — I509 Heart failure, unspecified: Secondary | ICD-10-CM | POA: Diagnosis present

## 2023-04-24 LAB — ECHOCARDIOGRAM COMPLETE
S' Lateral: 5.2 cm
Single Plane A4C EF: 30.7 %

## 2023-04-24 NOTE — Progress Notes (Signed)
  Echocardiogram 2D Echocardiogram has been performed.  Stacey Fletcher 04/24/2023, 3:53 PM

## 2023-04-29 ENCOUNTER — Other Ambulatory Visit: Payer: Self-pay

## 2023-04-29 ENCOUNTER — Other Ambulatory Visit (HOSPITAL_COMMUNITY): Payer: Self-pay

## 2023-04-29 DIAGNOSIS — J3089 Other allergic rhinitis: Secondary | ICD-10-CM

## 2023-04-29 MED ORDER — RYALTRIS 665-25 MCG/ACT NA SUSP
1.0000 | Freq: Two times a day (BID) | NASAL | 1 refills | Status: DC | PRN
  Filled 2023-04-29: qty 29, fill #0

## 2023-04-29 NOTE — Telephone Encounter (Signed)
 Received fax from [facility] pharmacy on [address] requesting refill of Azelastine-Fluticasone 137-50 MCG/ACT SUSP . Please advise ok for refill?

## 2023-04-30 ENCOUNTER — Other Ambulatory Visit (HOSPITAL_COMMUNITY): Payer: Self-pay

## 2023-04-30 MED ORDER — RYALTRIS 665-25 MCG/ACT NA SUSP
1.0000 | Freq: Two times a day (BID) | NASAL | 1 refills | Status: DC | PRN
  Filled 2023-04-30: qty 29, 30d supply, fill #0

## 2023-04-30 NOTE — Addendum Note (Signed)
 Addended by: Trudee Kuster on: 04/30/2023 03:29 PM   Modules accepted: Orders

## 2023-05-01 ENCOUNTER — Other Ambulatory Visit (HOSPITAL_COMMUNITY): Payer: Self-pay

## 2023-05-01 ENCOUNTER — Emergency Department (HOSPITAL_COMMUNITY)
Admission: EM | Admit: 2023-05-01 | Discharge: 2023-05-02 | Disposition: A | Discharge status: Home / Self Care | Attending: Emergency Medicine | Admitting: Emergency Medicine

## 2023-05-01 ENCOUNTER — Telehealth: Payer: Self-pay

## 2023-05-01 ENCOUNTER — Emergency Department (HOSPITAL_COMMUNITY)

## 2023-05-01 ENCOUNTER — Encounter (HOSPITAL_COMMUNITY): Payer: Self-pay

## 2023-05-01 ENCOUNTER — Other Ambulatory Visit: Payer: Self-pay

## 2023-05-01 DIAGNOSIS — M1711 Unilateral primary osteoarthritis, right knee: Secondary | ICD-10-CM | POA: Diagnosis not present

## 2023-05-01 DIAGNOSIS — J309 Allergic rhinitis, unspecified: Secondary | ICD-10-CM

## 2023-05-01 DIAGNOSIS — S82141A Displaced bicondylar fracture of right tibia, initial encounter for closed fracture: Secondary | ICD-10-CM | POA: Diagnosis not present

## 2023-05-01 DIAGNOSIS — Y92002 Bathroom of unspecified non-institutional (private) residence single-family (private) house as the place of occurrence of the external cause: Secondary | ICD-10-CM | POA: Insufficient documentation

## 2023-05-01 DIAGNOSIS — M7121 Synovial cyst of popliteal space [Baker], right knee: Secondary | ICD-10-CM | POA: Diagnosis not present

## 2023-05-01 DIAGNOSIS — Z7982 Long term (current) use of aspirin: Secondary | ICD-10-CM | POA: Insufficient documentation

## 2023-05-01 DIAGNOSIS — I10 Essential (primary) hypertension: Secondary | ICD-10-CM | POA: Diagnosis not present

## 2023-05-01 DIAGNOSIS — R55 Syncope and collapse: Secondary | ICD-10-CM | POA: Insufficient documentation

## 2023-05-01 DIAGNOSIS — W19XXXA Unspecified fall, initial encounter: Secondary | ICD-10-CM | POA: Diagnosis not present

## 2023-05-01 DIAGNOSIS — M25561 Pain in right knee: Secondary | ICD-10-CM | POA: Diagnosis not present

## 2023-05-01 DIAGNOSIS — G44309 Post-traumatic headache, unspecified, not intractable: Secondary | ICD-10-CM | POA: Diagnosis not present

## 2023-05-01 DIAGNOSIS — S8991XA Unspecified injury of right lower leg, initial encounter: Secondary | ICD-10-CM | POA: Diagnosis not present

## 2023-05-01 LAB — CBC
HCT: 41.8 % (ref 36.0–46.0)
Hemoglobin: 13.3 g/dL (ref 12.0–15.0)
MCH: 26.9 pg (ref 26.0–34.0)
MCHC: 31.8 g/dL (ref 30.0–36.0)
MCV: 84.6 fL (ref 80.0–100.0)
Platelets: 297 10*3/uL (ref 150–400)
RBC: 4.94 MIL/uL (ref 3.87–5.11)
RDW: 15.4 % (ref 11.5–15.5)
WBC: 10.6 10*3/uL — ABNORMAL HIGH (ref 4.0–10.5)
nRBC: 0 % (ref 0.0–0.2)

## 2023-05-01 LAB — BASIC METABOLIC PANEL
Anion gap: 9 (ref 5–15)
BUN: 25 mg/dL — ABNORMAL HIGH (ref 8–23)
CO2: 27 mmol/L (ref 22–32)
Calcium: 9.7 mg/dL (ref 8.9–10.3)
Chloride: 102 mmol/L (ref 98–111)
Creatinine, Ser: 0.96 mg/dL (ref 0.44–1.00)
GFR, Estimated: >60 mL/min (ref >60)
Glucose, Bld: 112 mg/dL — ABNORMAL HIGH (ref 70–99)
Potassium: 3.9 mmol/L (ref 3.5–5.1)
Sodium: 138 mmol/L (ref 135–145)

## 2023-05-01 LAB — CBG MONITORING, ED: Glucose-Capillary: 108 mg/dL — ABNORMAL HIGH (ref 70–99)

## 2023-05-01 MED ORDER — ACETAMINOPHEN 325 MG PO TABS
650.0000 mg | ORAL_TABLET | Freq: Once | ORAL | Status: AC
  Administered 2023-05-01: 650 mg via ORAL
  Filled 2023-05-01: qty 2

## 2023-05-01 MED ORDER — OXYCODONE HCL 5 MG PO TABS
5.0000 mg | ORAL_TABLET | Freq: Once | ORAL | Status: AC
  Administered 2023-05-02: 5 mg via ORAL
  Filled 2023-05-01: qty 1

## 2023-05-01 NOTE — ED Triage Notes (Signed)
 Pt arrived from home via POV s/p fall from loc event. Pt c/o right knee pain. 10/10 pain scale. Pt unable to put pressure on right leg.

## 2023-05-01 NOTE — Telephone Encounter (Signed)
 Forwarding message below

## 2023-05-01 NOTE — Telephone Encounter (Signed)
 Copied from CRM [id number]. Topic: Clinical - Prescription Issue >> Apr 30, 2023  5:21 PM Eunice Blase wrote: Reason for CRM: Received call from [facility] [address], [place] -  [address] [place] [address] Phone: [phone] Fax: [phone], stated insurance will not cover azelastrine but will cover dymista.

## 2023-05-02 ENCOUNTER — Emergency Department (HOSPITAL_COMMUNITY)

## 2023-05-02 DIAGNOSIS — M7121 Synovial cyst of popliteal space [Baker], right knee: Secondary | ICD-10-CM | POA: Diagnosis not present

## 2023-05-02 DIAGNOSIS — S82141A Displaced bicondylar fracture of right tibia, initial encounter for closed fracture: Secondary | ICD-10-CM | POA: Diagnosis not present

## 2023-05-02 DIAGNOSIS — M1711 Unilateral primary osteoarthritis, right knee: Secondary | ICD-10-CM | POA: Diagnosis not present

## 2023-05-02 DIAGNOSIS — S82111A Displaced fracture of right tibial spine, initial encounter for closed fracture: Secondary | ICD-10-CM | POA: Diagnosis not present

## 2023-05-02 LAB — URINALYSIS, ROUTINE W REFLEX MICROSCOPIC
Bilirubin Urine: NEGATIVE
Glucose, UA: >=500 mg/dL — AB
Hgb urine dipstick: NEGATIVE
Ketones, ur: 5 mg/dL — AB
Leukocytes,Ua: NEGATIVE
Nitrite: NEGATIVE
Protein, ur: NEGATIVE mg/dL
Specific Gravity, Urine: 1.024 (ref 1.005–1.030)
pH: 5 (ref 5.0–8.0)

## 2023-05-02 MED ORDER — AZELASTINE-FLUTICASONE 137-50 MCG/ACT NA SUSP
1.0000 | Freq: Two times a day (BID) | NASAL | 11 refills | Status: DC
  Filled 2023-05-02 – 2023-06-26 (×3): qty 23, 30d supply, fill #0

## 2023-05-02 MED ORDER — OXYCODONE-ACETAMINOPHEN 5-325 MG PO TABS: 1.0000 | ORAL_TABLET | Freq: Three times a day (TID) | ORAL | 0 refills | Status: DC | PRN

## 2023-05-02 NOTE — ED Provider Notes (Signed)
 [place] EMERGENCY DEPARTMENT AT [facility] Provider Note   CSN: [id number] Arrival date & time: 05/01/23  1925     History  Chief Complaint  Patient presents with   Fall   Loss of Consciousness    Stacey Fletcher is a 73 y.o. female.  73 yo F here after a fall with right knee pain. Had been in the bathroom. Stood up, turned around and got light headed and syncopized falling on her knee and maybe hitting her head. Mostl;y here for the knee pain, difficult to walk. No pain elsewhere. No other symptoms around the time of syncope, no chest pain, palpitations, headaches, abdominal pain, back pain or other symptoms. Feels fine now aside from knee pain. Has syncopized in the bathroom previously.    Fall  Loss of Consciousness      Home Medications Prior to Admission medications   Medication Sig Start Date End Date Taking? Authorizing Provider  aspirin 81 MG chewable tablet Chew 81 mg by mouth in the morning.    [provider]  buPROPion (WELLBUTRIN XL) 300 MG 24 hr tablet Take 1 tablet (300 mg total) by mouth every morning. 04/01/23   Garnette Gunner, MD  dapagliflozin propanediol (FARXIGA) 10 MG TABS tablet Take 1 tablet (10 mg total) by mouth daily. 09/04/21   Sharlene Dory, PA-C  digoxin (LANOXIN) 0.125 MG tablet Take 0.5 tablets (0.0625 mg total) by mouth daily. 03/29/23   Sabharwal, Aditya, DO  diphenhydrAMINE (BENADRYL) 25 MG tablet Take 25 mg by mouth at bedtime.    [provider]  furosemide (LASIX) 40 MG tablet Take 1 tablet (40 mg total) by mouth daily. 02/21/23   Janetta Hora, PA-C  levothyroxine (SYNTHROID) 25 MCG tablet Take 25 mcg by mouth daily before breakfast. 12/26/19   [provider]  losartan (COZAAR) 100 MG tablet Take 1 tablet (100 mg total) by mouth daily. 06/07/22   Sabharwal, Aditya, DO  losartan (COZAAR) 50 MG tablet Take 1 tablet (50 mg total) by mouth daily. 06/07/22 03/28/24  Sabharwal, Aditya, DO  metFORMIN  (GLUCOPHAGE) 1000 MG tablet Take 1,000 mg by mouth daily. 11/10/21   [provider]  metoprolol succinate (TOPROL-XL) 50 MG 24 hr tablet Take 3 tablets (150 mg total) by mouth daily. Take with or immediately following a meal. 09/28/22   Sabharwal, Aditya, DO  Olopatadine-Mometasone (RYALTRIS) 665-25 MCG/ACT SUSP Place 1-2 sprays into both nostrils 2 (two) times daily as needed (Allergies). 04/30/23   Garnette Gunner, MD  Potassium Chloride ER 20 MEQ TBCR Take 1 tablet (20 mEq total) by mouth every other day. 03/29/23   Sabharwal, Aditya, DO  pravastatin (PRAVACHOL) 20 MG tablet Take 20 mg by mouth in the morning. 11/05/18   [provider]  spironolactone (ALDACTONE) 25 MG tablet Take 1 tablet (25 mg total) by mouth daily. 04/02/23   Sabharwal, Eliezer Lofts, DO  tirzepatide [facility]) 2.5 MG/0.5ML Pen INJECT 1/2 (ONE-HALF) ML SUBCUTANEOUSLY  ONCE A WEEK 04/23/23   Garnette Gunner, MD  venlafaxine XR (EFFEXOR-XR) 75 MG 24 hr capsule Take 75 mg by mouth in the morning, at noon, and at bedtime. 02/25/20   [provider]      Allergies    Entresto [sacubitril-valsartan], Prednisone, Ace inhibitors, Empagliflozin, Sitagliptin, and Procardia [nifedipine]    Review of Systems   Review of Systems  Cardiovascular:  Positive for syncope.    Physical Exam Updated Vital Signs BP 127/63 (BP Location: Right Arm)   Pulse  86   Temp 98.6 F (37 C)   Resp 20   Ht 5\' 10"  (1.778 m)   Wt 86.2 kg   SpO2 96%   BMI 27.26 kg/m  Physical Exam Vitals and nursing note reviewed.  Constitutional:      Appearance: She is well-developed.  HENT:     Head: Normocephalic and atraumatic.  Cardiovascular:     Rate and Rhythm: Normal rate and regular rhythm.     Comments: DP's intact, sensation intact and symmetric Pulmonary:     Effort: No respiratory distress.     Breath sounds: No stridor.  Abdominal:     General: There is no distension.  Musculoskeletal:        General: Swelling and  tenderness (right knee) present.     Cervical back: Normal range of motion.  Skin:    General: Skin is warm and dry.  Neurological:     Mental Status: She is alert.     ED Results / Procedures / Treatments   Labs (all labs ordered are listed, but only abnormal results are displayed) Labs Reviewed  BASIC METABOLIC PANEL - Abnormal; Notable for the following components:      Result Value   Glucose, Bld 112 (*)    BUN 25 (*)    All other components within normal limits  CBC - Abnormal; Notable for the following components:   WBC 10.6 (*)    All other components within normal limits  CBG MONITORING, ED - Abnormal; Notable for the following components:   Glucose-Capillary 108 (*)    All other components within normal limits  URINALYSIS, ROUTINE W REFLEX MICROSCOPIC    EKG None  Radiology CT HEAD WO CONTRAST Result Date: 05/01/2023 CLINICAL DATA:  Recent fall with headaches, initial encounter EXAM: CT HEAD WITHOUT CONTRAST TECHNIQUE: Contiguous axial images were obtained from the base of the skull through the vertex without intravenous contrast. RADIATION DOSE REDUCTION: This exam was performed according to the departmental dose-optimization program which includes automated exposure control, adjustment of the mA and/or kV according to patient size and/or use of iterative reconstruction technique. COMPARISON:  06/20/2016 FINDINGS: Brain: No evidence of acute infarction, hemorrhage, hydrocephalus, extra-axial collection or mass lesion/mass effect. Vascular: No hyperdense vessel or unexpected calcification. Skull: Normal. Negative for fracture or focal lesion. Sinuses/Orbits: No acute finding. Other: None. IMPRESSION: No acute intracranial abnormality noted. Electronically Signed   By: Alcide Clever M.D.   On: 05/01/2023 21:38   DG Knee Complete 4 Views Right Result Date: 05/01/2023 CLINICAL DATA:  Right knee injury due to a fall. EXAM: RIGHT KNEE - COMPLETE 4+ VIEW COMPARISON:  01/25/2022  FINDINGS: Severe degenerative changes in the right knee with lateral greater than medial compartment narrowing and severe tricompartment osteophyte formation. On the lateral view, there is a cortical lucency across the posterior tibial plateau extending to the articular surface likely indicating an acute fracture. No displacement of the fracture fragment. No significant effusion. Soft tissues are unremarkable. IMPRESSION: 1. Severe degenerative changes in the right knee. 2. Apparent nondisplaced fracture across the posterior tibial plateau. Electronically Signed   By: Burman Nieves M.D.   On: 05/01/2023 21:10    Procedures Procedures    Medications Ordered in ED Medications  oxyCODONE (Oxy IR/ROXICODONE) immediate release tablet 5 mg (has no administration in time range)  acetaminophen (TYLENOL) tablet 650 mg (650 mg Oral Given 05/01/23 2209)    ED Course/ Medical Decision Making/ A&P  Medical Decision Making Amount and/or Complexity of Data Reviewed Labs: ordered. Radiology: ordered.  Risk OTC drugs. Prescription drug management.   Xr with small posterior tibial plateau fracture. Pain meds given. Will ct for extent of fracture and for operative planning if needed. otherwise likely crutches and knee immobilizer.   Ct scan redemonstrated mild fracture. Crutches/knee immobilizer, NWB until she follow up with her ortho. Short course of pain meds prescribed.        Final Clinical Impression(s) / ED Diagnoses Final diagnoses:  None    Rx / DC Orders ED Discharge Orders     None         Anetra Czerwinski, Barbara Cower, MD 05/02/23 [phone]

## 2023-05-02 NOTE — Progress Notes (Signed)
 Orthopedic Tech Progress Note Patient Details:  Stacey Fletcher 1950/03/02 [id number]  Ortho Devices Type of Ortho Device: Crutches, Knee Immobilizer Ortho Device/Splint Location: rle Ortho Device/Splint Interventions: Ordered, Application, Adjustment  After applying the brace I taught the patient how to use the crutches they got up and walked ok.I explained to the family that the people at home would need to assist. Post Interventions Patient Tolerated: Well Instructions Provided: Care of device, Adjustment of device  Trinna Post 05/02/2023, 2:13 AM

## 2023-05-03 ENCOUNTER — Other Ambulatory Visit (HOSPITAL_COMMUNITY): Payer: Self-pay

## 2023-05-06 ENCOUNTER — Encounter (HOSPITAL_COMMUNITY): Payer: Self-pay | Admitting: Pharmacist

## 2023-05-06 ENCOUNTER — Other Ambulatory Visit: Payer: Self-pay

## 2023-05-06 ENCOUNTER — Other Ambulatory Visit (HOSPITAL_COMMUNITY): Payer: Self-pay

## 2023-05-07 ENCOUNTER — Other Ambulatory Visit: Payer: Self-pay

## 2023-05-07 ENCOUNTER — Other Ambulatory Visit (HOSPITAL_COMMUNITY): Payer: Self-pay

## 2023-05-09 ENCOUNTER — Other Ambulatory Visit: Payer: Self-pay

## 2023-05-09 ENCOUNTER — Encounter (HOSPITAL_COMMUNITY): Payer: Self-pay | Admitting: *Deleted

## 2023-05-14 ENCOUNTER — Ambulatory Visit: Admitting: Orthopedic Surgery

## 2023-05-14 ENCOUNTER — Telehealth: Payer: Self-pay | Admitting: Radiology

## 2023-05-14 ENCOUNTER — Other Ambulatory Visit (INDEPENDENT_AMBULATORY_CARE_PROVIDER_SITE_OTHER): Payer: Self-pay

## 2023-05-14 ENCOUNTER — Telehealth (HOSPITAL_COMMUNITY): Payer: Self-pay | Admitting: *Deleted

## 2023-05-14 ENCOUNTER — Encounter: Payer: Self-pay | Admitting: Orthopedic Surgery

## 2023-05-14 ENCOUNTER — Other Ambulatory Visit: Payer: Self-pay | Admitting: Orthopedic Surgery

## 2023-05-14 DIAGNOSIS — I5042 Chronic combined systolic (congestive) and diastolic (congestive) heart failure: Secondary | ICD-10-CM

## 2023-05-14 DIAGNOSIS — M25561 Pain in right knee: Secondary | ICD-10-CM

## 2023-05-14 DIAGNOSIS — M170 Bilateral primary osteoarthritis of knee: Secondary | ICD-10-CM | POA: Diagnosis not present

## 2023-05-14 DIAGNOSIS — S82141A Displaced bicondylar fracture of right tibia, initial encounter for closed fracture: Secondary | ICD-10-CM

## 2023-05-14 MED ORDER — OXYCODONE-ACETAMINOPHEN 5-325 MG PO TABS: 1.0000 | ORAL_TABLET | Freq: Three times a day (TID) | ORAL | 0 refills | Status: AC | PRN

## 2023-05-14 NOTE — Telephone Encounter (Signed)
 Called patient per Dr. Gasper Lloyd with following order:  "Refer to EP for ICD."  Pt agreed to same, referral sent. Informed patient EP office will reach out to schedule her first appointment. Asked patient to call us at [phone] if she doesn't hear from EP office.

## 2023-05-14 NOTE — Telephone Encounter (Signed)
 Patient is requesting order for wheelchair and is there anything else she could take for pain or just deal with it.   These questions were asked in the lobby after visit. I advised I would send a message because I unfortunately did not have those answers and are addressed during the visit.   Patient originally requested knee scooter, however per Denny Peon needs wheelchair due to tibial plateau fracture. Patient did make follow up visit.

## 2023-05-14 NOTE — Telephone Encounter (Signed)
 I will put in an order for a wheelchair with [employer] but the pt is asking if she can have something for pain? She is s/p a tib/plat fx and you saw her in the office this morning.

## 2023-05-14 NOTE — Progress Notes (Signed)
 Office Visit Note   Patient: Stacey Fletcher           Date of Birth: 1951/01/13           MRN: [id number] Visit Date: 05/14/2023              Requested by: No referring provider defined for this encounter. PCP: Pcp, No  Chief Complaint  Patient presents with   Right Knee - Pain      HPI: Patient is a 73 year old woman who was seen for initial evaluation for right knee pain.  Patient states she did have a fall in the bathroom but had a hyperflexion injury while kneeling.  Patient states she went to the emergency room radiographs and a CT scan were obtained and patient is seen today for initial evaluation.  Patient states she is not getting much relief with her Percocet.  Assessment & Plan: Visit Diagnoses:  1. Acute pain of right knee   2. Bilateral primary osteoarthritis of knee   3. Closed fracture of right tibial plateau, initial encounter     Plan: Recommended patient use ice and Voltaren gel.  Follow-up in 4 weeks with repeat three-view radiographs of the right knee.  Follow-Up Instructions: No follow-ups on file.   Ortho Exam  Patient is alert, oriented, no adenopathy, well-dressed, normal affect, normal respiratory effort. Examination patient has swelling and global tenderness to palpation around the right knee,.  Collaterals and cruciates are stable.  There is an effusion.  Imaging: No results found. No images are attached to the encounter.  Labs: Lab Results  Component Value Date   HGBA1C 7.2 (A) 12/21/2022   HGBA1C 7.4 (A) 09/07/2022   HGBA1C 6.7 (H) 07/20/2021   ESRSEDRATE 13 08/03/2009   REPTSTATUS 05/11/2013 FINAL 05/07/2013   CULT  05/07/2013    NO SALMONELLA, SHIGELLA, CAMPYLOBACTER, YERSINIA, OR E.COLI 0157:H7 ISOLATED Performed at [facility]   [facility] ESCHERICHIA COLI 05/06/2013     Lab Results  Component Value Date   ALBUMIN 4.2 12/21/2022   ALBUMIN 3.8 08/10/2021   ALBUMIN 3.7 05/02/2020    Lab Results  Component Value  Date   MG 1.9 07/20/2021   MG 1.7 05/06/2013   MG 1.9 10/20/2011   Lab Results  Component Value Date   VD25OH 30 03/01/2009    No results found for: "PREALBUMIN"    Latest Ref Rng & Units 05/01/2023    8:19 PM 12/21/2022    2:02 PM 11/06/2022   11:49 PM  CBC EXTENDED  WBC 4.0 - 10.5 K/uL 10.6  7.3  10.0   RBC 3.87 - 5.11 MIL/uL 4.94  4.91  5.25   Hemoglobin 12.0 - 15.0 g/dL 40.9  81.1  91.4   HCT 36.0 - 46.0 % 41.8  41.8  44.6   Platelets 150 - 400 K/uL 297  221.0  265   NEUT# 1.4 - 7.7 K/uL  4.6    Lymph# 0.7 - 4.0 K/uL  2.1       There is no height or weight on file to calculate BMI.  Orders:  Orders Placed This Encounter  Procedures   XR Knee 1-2 Views Right   No orders of the defined types were placed in this encounter.    Procedures: No procedures performed  Clinical Data: No additional findings.  ROS:  All other systems negative, except as noted in the HPI. Review of Systems  Objective: Vital Signs: There were no vitals taken for this visit.  Specialty Comments:  No specialty comments available.  PMFS History: Patient Active Problem List   Diagnosis Date Noted   Onychomycosis 12/24/2022   Allergic rhinitis due to allergen 11/20/2022   Chronic combined systolic and diastolic heart failure (HCC) 09/10/2022   Severe mitral regurgitation 12/14/2021   Elevated troponin 07/20/2021   Acute CHF (congestive heart failure) (HCC) 07/20/2021   Pneumonia    Osteoarthritis    MI (myocardial infarction) (HCC)    Hypertension    Goiter    Fibromyalgia    Depression    Coronary artery disease    Arthritis of carpometacarpal (CMC) joint of right thumb 07/15/2018   Pain of right hand 07/15/2018   ARF (acute renal failure) (HCC) 08/20/2014   Chest pain 05/06/2013   Nausea vomiting and diarrhea 05/06/2013   Fever 05/06/2013   HTN (hypertension) 05/06/2013   Hypothyroidism 05/06/2013   Diabetes mellitus (HCC) 05/06/2013   Past Medical History:   Diagnosis Date   Coronary artery disease    Depression    Diabetes mellitus    Fibromyalgia    Goiter    Hypertension    MI (myocardial infarction) (HCC)    Osteoarthritis    Pneumonia    10+ years ago.    Family History  Problem Relation Age of Onset   Hypertension Mother    Diabetes Mother    Stroke Mother    Hypertension Other    Diabetes Other     Past Surgical History:  Procedure Laterality Date   ABDOMINAL HYSTERECTOMY     BREAST SURGERY     BUBBLE STUDY  12/14/2021   Procedure: BUBBLE STUDY;  Surgeon: Christell Constant, MD;  Location: [facility];  Service: Cardiovascular;;   CARDIAC CATHETERIZATION     1995   REDUCTION MAMMAPLASTY Bilateral    RIGHT/LEFT HEART CATH AND CORONARY ANGIOGRAPHY N/A 07/21/2021   Procedure: RIGHT/LEFT HEART CATH AND CORONARY ANGIOGRAPHY;  Surgeon: Runell Gess, MD;  Location: [facility] INVAS[facility] LAB;  Service: Cardiovascular;  Laterality: N/A;   TEE WITHOUT CARDIOVERSION N/A 12/14/2021   Procedure: TRANSESOPHAGEAL ECHOCARDIOGRAM (TEE);  Surgeon: Christell Constant, MD;  Location: [facility] ENDOSCOPY;  Service: Cardiovascular;  Laterality: N/A;   Social History   Occupational History   Occupation: retired  Tobacco Use   Smoking status: Former    Current packs/day: 0.00    Types: Cigarettes    Quit date: 07/07/2018    Years since quitting: 4.8    Passive exposure: Never   Smokeless tobacco: Never  Vaping Use   Vaping status: Never Used  Substance and Sexual Activity   Alcohol use: No   Drug use: No   Sexual activity: Not Currently

## 2023-05-15 NOTE — Telephone Encounter (Signed)
 I will placed wheelchair order through [employer].

## 2023-05-16 NOTE — Telephone Encounter (Signed)
 Entered wheelchair request through [employer] to [employer]. Pending. Pt is aware.

## 2023-05-19 ENCOUNTER — Other Ambulatory Visit: Payer: Self-pay | Admitting: Family Medicine

## 2023-05-19 DIAGNOSIS — E11628 Type 2 diabetes mellitus with other skin complications: Secondary | ICD-10-CM

## 2023-05-19 DIAGNOSIS — N1831 Chronic kidney disease, stage 3a: Secondary | ICD-10-CM

## 2023-05-20 ENCOUNTER — Telehealth: Payer: Self-pay | Admitting: Orthopedic Surgery

## 2023-05-20 NOTE — Telephone Encounter (Signed)
 Pt is concerned about the wheel chair Dr. Julio Ohm sent in for her.pt has a couple of questions. Best call back [phone]

## 2023-05-20 NOTE — Telephone Encounter (Signed)
 Message sent to [employer] to question the status of this order. It was entered in [employer] last week.

## 2023-05-21 ENCOUNTER — Other Ambulatory Visit (HOSPITAL_COMMUNITY): Payer: 59

## 2023-05-22 NOTE — Telephone Encounter (Signed)
 [employer] messaged stating that pt's wheelchair was set for delivery

## 2023-05-24 ENCOUNTER — Other Ambulatory Visit: Payer: Self-pay | Admitting: Family Medicine

## 2023-05-30 ENCOUNTER — Ambulatory Visit: Admitting: Orthopedic Surgery

## 2023-05-30 ENCOUNTER — Ambulatory Visit: Attending: Internal Medicine | Admitting: Internal Medicine

## 2023-05-30 ENCOUNTER — Other Ambulatory Visit (INDEPENDENT_AMBULATORY_CARE_PROVIDER_SITE_OTHER): Payer: Self-pay

## 2023-05-30 DIAGNOSIS — S82141A Displaced bicondylar fracture of right tibia, initial encounter for closed fracture: Secondary | ICD-10-CM | POA: Diagnosis not present

## 2023-06-12 ENCOUNTER — Other Ambulatory Visit: Payer: Self-pay | Admitting: Family Medicine

## 2023-06-12 DIAGNOSIS — E11628 Type 2 diabetes mellitus with other skin complications: Secondary | ICD-10-CM

## 2023-06-12 DIAGNOSIS — N1831 Chronic kidney disease, stage 3a: Secondary | ICD-10-CM

## 2023-06-13 ENCOUNTER — Ambulatory Visit: Payer: 59 | Admitting: Orthopedic Surgery

## 2023-06-19 ENCOUNTER — Other Ambulatory Visit (HOSPITAL_COMMUNITY): Payer: Self-pay | Admitting: Cardiology

## 2023-06-19 ENCOUNTER — Encounter: Payer: Self-pay | Admitting: Orthopedic Surgery

## 2023-06-19 MED ORDER — DAPAGLIFLOZIN PROPANEDIOL 10 MG PO TABS: 10.0000 mg | ORAL_TABLET | Freq: Every day | ORAL | 3 refills | Status: AC

## 2023-06-19 NOTE — Progress Notes (Signed)
 Office Visit Note   Patient: Stacey Fletcher           Date of Birth: January 10, 1951           MRN: [id number] Visit Date: 05/30/2023              Requested by: No referring provider defined for this encounter. PCP: Pcp, No  Chief Complaint  Patient presents with   Right Knee - Follow-up      HPI: Patient is a 73 year old woman with insufficiency avulsion tibial plateau fracture right knee.  She has been using ice and Voltaren  gel.  Assessment & Plan: Visit Diagnoses:  1. Closed fracture of right tibial plateau, initial encounter     Plan: Recommended stopping the Percocet use Voltaren  gel topically and repeat 2 view radiographs of the right knee at follow-up.  Follow-Up Instructions: Return in about 4 weeks (around 06/27/2023).   Ortho Exam  Patient is alert, oriented, no adenopathy, well-dressed, normal affect, normal respiratory effort. Examination patient does have a effusion of the right knee.  She has varus alignment.  She is tender to palpation medial lateral joint line as well as the patellofemoral joint with tricompartment arthritic changes  Imaging: No results found. No images are attached to the encounter.  Labs: Lab Results  Component Value Date   HGBA1C 7.2 (A) 12/21/2022   HGBA1C 7.4 (A) 09/07/2022   HGBA1C 6.7 (H) 07/20/2021   ESRSEDRATE 13 08/03/2009   REPTSTATUS 05/11/2013 FINAL 05/07/2013   CULT  05/07/2013    NO SALMONELLA, SHIGELLA, CAMPYLOBACTER, YERSINIA, OR E.COLI 0157:H7 ISOLATED Performed at [facility]   [facility] ESCHERICHIA COLI 05/06/2013     Lab Results  Component Value Date   ALBUMIN 4.2 12/21/2022   ALBUMIN 3.8 08/10/2021   ALBUMIN 3.7 05/02/2020    Lab Results  Component Value Date   MG 1.9 07/20/2021   MG 1.7 05/06/2013   MG 1.9 10/20/2011   Lab Results  Component Value Date   VD25OH 30 03/01/2009    No results found for: "PREALBUMIN"    Latest Ref Rng & Units 05/01/2023    8:19 PM 12/21/2022    2:02 PM  11/06/2022   11:49 PM  CBC EXTENDED  WBC 4.0 - 10.5 K/uL 10.6  7.3  10.0   RBC 3.87 - 5.11 MIL/uL 4.94  4.91  5.25   Hemoglobin 12.0 - 15.0 g/dL 40.9  81.1  91.4   HCT 36.0 - 46.0 % 41.8  41.8  44.6   Platelets 150 - 400 K/uL 297  221.0  265   NEUT# 1.4 - 7.7 K/uL  4.6    Lymph# 0.7 - 4.0 K/uL  2.1       There is no height or weight on file to calculate BMI.  Orders:  Orders Placed This Encounter  Procedures   XR KNEE 3 VIEW RIGHT   No orders of the defined types were placed in this encounter.    Procedures: No procedures performed  Clinical Data: No additional findings.  ROS:  All other systems negative, except as noted in the HPI. Review of Systems  Objective: Vital Signs: There were no vitals taken for this visit.  Specialty Comments:  No specialty comments available.  PMFS History: Patient Active Problem List   Diagnosis Date Noted   Onychomycosis 12/24/2022   Allergic rhinitis due to allergen 11/20/2022   Chronic combined systolic and diastolic heart failure (HCC) 09/10/2022   Severe mitral regurgitation 12/14/2021   Elevated troponin  07/20/2021   Acute CHF (congestive heart failure) (HCC) 07/20/2021   Pneumonia    Osteoarthritis    MI (myocardial infarction) (HCC)    Hypertension    Goiter    Fibromyalgia    Depression    Coronary artery disease    Arthritis of carpometacarpal (CMC) joint of right thumb 07/15/2018   Pain of right hand 07/15/2018   ARF (acute renal failure) (HCC) 08/20/2014   Chest pain 05/06/2013   Nausea vomiting and diarrhea 05/06/2013   Fever 05/06/2013   HTN (hypertension) 05/06/2013   Hypothyroidism 05/06/2013   Diabetes mellitus (HCC) 05/06/2013   Past Medical History:  Diagnosis Date   Coronary artery disease    Depression    Diabetes mellitus    Fibromyalgia    Goiter    Hypertension    MI (myocardial infarction) (HCC)    Osteoarthritis    Pneumonia    10+ years ago.    Family History  Problem Relation Age  of Onset   Hypertension Mother    Diabetes Mother    Stroke Mother    Hypertension Other    Diabetes Other     Past Surgical History:  Procedure Laterality Date   ABDOMINAL HYSTERECTOMY     BREAST SURGERY     BUBBLE STUDY  12/14/2021   Procedure: BUBBLE STUDY;  Surgeon: Jann Melody, MD;  Location: [facility];  Service: Cardiovascular;;   CARDIAC CATHETERIZATION     1995   REDUCTION MAMMAPLASTY Bilateral    RIGHT/LEFT HEART CATH AND CORONARY ANGIOGRAPHY N/A 07/21/2021   Procedure: RIGHT/LEFT HEART CATH AND CORONARY ANGIOGRAPHY;  Surgeon: Avanell Leigh, MD;  Location: [facility];  Service: Cardiovascular;  Laterality: N/A;   TEE WITHOUT CARDIOVERSION N/A 12/14/2021   Procedure: TRANSESOPHAGEAL ECHOCARDIOGRAM (TEE);  Surgeon: Jann Melody, MD;  Location: [facility] ENDOSCOPY;  Service: Cardiovascular;  Laterality: N/A;   Social History   Occupational History   Occupation: retired  Tobacco Use   Smoking status: Former    Current packs/day: 0.00    Types: Cigarettes    Quit date: 07/07/2018    Years since quitting: 4.9    Passive exposure: Never   Smokeless tobacco: Never  Vaping Use   Vaping status: Never Used  Substance and Sexual Activity   Alcohol use: No   Drug use: No   Sexual activity: Not Currently

## 2023-06-20 DIAGNOSIS — Z79891 Long term (current) use of opiate analgesic: Secondary | ICD-10-CM | POA: Diagnosis not present

## 2023-06-20 DIAGNOSIS — M25562 Pain in left knee: Secondary | ICD-10-CM | POA: Diagnosis not present

## 2023-06-20 DIAGNOSIS — M79604 Pain in right leg: Secondary | ICD-10-CM | POA: Diagnosis not present

## 2023-06-20 DIAGNOSIS — G894 Chronic pain syndrome: Secondary | ICD-10-CM | POA: Diagnosis not present

## 2023-06-21 ENCOUNTER — Other Ambulatory Visit: Payer: Self-pay | Admitting: Family Medicine

## 2023-06-21 DIAGNOSIS — E11628 Type 2 diabetes mellitus with other skin complications: Secondary | ICD-10-CM

## 2023-06-21 DIAGNOSIS — E1122 Type 2 diabetes mellitus with diabetic chronic kidney disease: Secondary | ICD-10-CM

## 2023-06-25 ENCOUNTER — Other Ambulatory Visit (HOSPITAL_COMMUNITY): Payer: Self-pay | Admitting: Cardiology

## 2023-06-26 ENCOUNTER — Other Ambulatory Visit (HOSPITAL_COMMUNITY): Payer: Self-pay

## 2023-06-27 ENCOUNTER — Ambulatory Visit (INDEPENDENT_AMBULATORY_CARE_PROVIDER_SITE_OTHER): Admitting: Orthopedic Surgery

## 2023-06-27 ENCOUNTER — Other Ambulatory Visit (INDEPENDENT_AMBULATORY_CARE_PROVIDER_SITE_OTHER)

## 2023-06-27 ENCOUNTER — Other Ambulatory Visit (HOSPITAL_COMMUNITY): Payer: Self-pay

## 2023-06-27 DIAGNOSIS — M1711 Unilateral primary osteoarthritis, right knee: Secondary | ICD-10-CM

## 2023-06-27 DIAGNOSIS — S82141A Displaced bicondylar fracture of right tibia, initial encounter for closed fracture: Secondary | ICD-10-CM

## 2023-07-01 ENCOUNTER — Encounter: Payer: Self-pay | Admitting: Orthopedic Surgery

## 2023-07-01 NOTE — Progress Notes (Signed)
 Office Visit Note   Patient: Stacey Fletcher           Date of Birth: January 12, 1951           MRN: [id number] Visit Date: 06/27/2023              Requested by: Catheryn Cluck, MD [address] [place],  [place] [address] PCP: Pcp, No  Chief Complaint  Patient presents with   Right Knee - Follow-up      HPI: Patient is a 73 year old woman with advanced arthritis of the right knee with a nondisplaced avulsion tibial plateau fracture.  Patient states she has knee pain as well as some swelling distally in her leg.  She has used Voltaren  gel and a knee sleeve.  Assessment & Plan: Visit Diagnoses:  1. Closed fracture of right tibial plateau, initial encounter   2. Unilateral primary osteoarthritis, right knee     Plan: Continue with conservative therapy.  Discussed the risks and benefits of total knee arthroplasty.  Follow-Up Instructions: Return in about 4 weeks (around 07/25/2023).   Ortho Exam  Patient is alert, oriented, no adenopathy, well-dressed, normal affect, normal respiratory effort. Examination the right knee is stable there is crepitation with range of motion there is no cellulitis there is mild swelling.  Patient also has some swelling in the right leg.  With her knee extended and dorsiflexion of the ankle there is no pain.  Calf is soft no evidence of DVT.  Imaging: No results found. No images are attached to the encounter.  Labs: Lab Results  Component Value Date   HGBA1C 7.2 (A) 12/21/2022   HGBA1C 7.4 (A) 09/07/2022   HGBA1C 6.7 (H) 07/20/2021   ESRSEDRATE 13 08/03/2009   REPTSTATUS 05/11/2013 FINAL 05/07/2013   CULT  05/07/2013    NO SALMONELLA, SHIGELLA, CAMPYLOBACTER, YERSINIA, OR E.COLI 0157:H7 ISOLATED Performed at [facility]   [facility] ESCHERICHIA COLI 05/06/2013     Lab Results  Component Value Date   ALBUMIN 4.2 12/21/2022   ALBUMIN 3.8 08/10/2021   ALBUMIN 3.7 05/02/2020    Lab Results  Component Value Date   MG  1.9 07/20/2021   MG 1.7 05/06/2013   MG 1.9 10/20/2011   Lab Results  Component Value Date   VD25OH 30 03/01/2009    No results found for: "PREALBUMIN"    Latest Ref Rng & Units 05/01/2023    8:19 PM 12/21/2022    2:02 PM 11/06/2022   11:49 PM  CBC EXTENDED  WBC 4.0 - 10.5 K/uL 10.6  7.3  10.0   RBC 3.87 - 5.11 MIL/uL 4.94  4.91  5.25   Hemoglobin 12.0 - 15.0 g/dL 29.5  62.1  30.8   HCT 36.0 - 46.0 % 41.8  41.8  44.6   Platelets 150 - 400 K/uL 297  221.0  265   NEUT# 1.4 - 7.7 K/uL  4.6    Lymph# 0.7 - 4.0 K/uL  2.1       There is no height or weight on file to calculate BMI.  Orders:  Orders Placed This Encounter  Procedures   XR Knee 1-2 Views Right   No orders of the defined types were placed in this encounter.    Procedures: No procedures performed  Clinical Data: No additional findings.  ROS:  All other systems negative, except as noted in the HPI. Review of Systems  Objective: Vital Signs: There were no vitals taken for this visit.  Specialty Comments:  No specialty comments available.  PMFS History: Patient Active Problem List   Diagnosis Date Noted   Onychomycosis 12/24/2022   Allergic rhinitis due to allergen 11/20/2022   Chronic combined systolic and diastolic heart failure (HCC) 09/10/2022   Severe mitral regurgitation 12/14/2021   Elevated troponin 07/20/2021   Acute CHF (congestive heart failure) (HCC) 07/20/2021   Pneumonia    Osteoarthritis    MI (myocardial infarction) (HCC)    Hypertension    Goiter    Fibromyalgia    Depression    Coronary artery disease    Arthritis of carpometacarpal (CMC) joint of right thumb 07/15/2018   Pain of right hand 07/15/2018   ARF (acute renal failure) (HCC) 08/20/2014   Chest pain 05/06/2013   Nausea vomiting and diarrhea 05/06/2013   Fever 05/06/2013   HTN (hypertension) 05/06/2013   Hypothyroidism 05/06/2013   Diabetes mellitus (HCC) 05/06/2013   Past Medical History:  Diagnosis Date    Coronary artery disease    Depression    Diabetes mellitus    Fibromyalgia    Goiter    Hypertension    MI (myocardial infarction) (HCC)    Osteoarthritis    Pneumonia    10+ years ago.    Family History  Problem Relation Age of Onset   Hypertension Mother    Diabetes Mother    Stroke Mother    Hypertension Other    Diabetes Other     Past Surgical History:  Procedure Laterality Date   ABDOMINAL HYSTERECTOMY     BREAST SURGERY     BUBBLE STUDY  12/14/2021   Procedure: BUBBLE STUDY;  Surgeon: Jann Melody, MD;  Location: [facility] ENDOS[facility];  Service: Cardiovascular;;   CARDIAC CATHETERIZATION     1995   REDUCTION MAMMAPLASTY Bilateral    RIGHT/LEFT HEART CATH AND CORONARY ANGIOGRAPHY N/A 07/21/2021   Procedure: RIGHT/LEFT HEART CATH AND CORONARY ANGIOGRAPHY;  Surgeon: Avanell Leigh, MD;  [facility];  Service: Cardiovascular;  Laterality: N/A;   TEE WITHOUT CARDIOVERSION N/A 12/14/2021   Procedure: TRANSESOPHAGEAL ECHOCARDIOGRAM (TEE);  Surgeon: Jann Melody, MD;  Location: [facility] ENDOSCOPY;  Service: Cardiovascular;  Laterality: N/A;   Social History   Occupational History   Occupation: retired  Tobacco Use   Smoking status: Former    Current packs/day: 0.00    Types: Cigarettes    Quit date: 07/07/2018    Years since quitting: 4.9    Passive exposure: Never   Smokeless tobacco: Never  Vaping Use   Vaping status: Never Used  Substance and Sexual Activity   Alcohol use: No   Drug use: No   Sexual activity: Not Currently

## 2023-07-02 ENCOUNTER — Telehealth: Payer: Self-pay

## 2023-07-02 DIAGNOSIS — J301 Allergic rhinitis due to pollen: Secondary | ICD-10-CM

## 2023-07-02 NOTE — Telephone Encounter (Signed)
 Copied from CRM [id number]. Topic: Clinical - Prescription Issue >> Jul 02, 2023 11:27 AM Jenice Mitts wrote: Reason for CRM: Tray from [facility] is calling because the patients insurance will over cover brand name and they are wondering if a new prescription can be sent over so the insurance will cover it   Azelastine -Fluticasone  137-50 MCG/ACT

## 2023-07-03 ENCOUNTER — Other Ambulatory Visit (HOSPITAL_COMMUNITY): Payer: Self-pay

## 2023-07-03 MED ORDER — DYMISTA 137-50 MCG/ACT NA SUSP
1.0000 | Freq: Two times a day (BID) | NASAL | 11 refills | Status: AC
  Filled 2023-07-03: qty 23, fill #0
  Filled 2023-07-03 – 2023-07-06 (×2): qty 23, 30d supply, fill #0
  Filled 2023-08-02: qty 23, 30d supply, fill #1
  Filled 2023-09-01: qty 23, 30d supply, fill #2

## 2023-07-03 NOTE — Addendum Note (Signed)
 Addended by: Catheryn Cluck on: 07/03/2023 03:26 PM   Modules accepted: Orders

## 2023-07-04 ENCOUNTER — Other Ambulatory Visit: Payer: Self-pay

## 2023-07-04 ENCOUNTER — Encounter (HOSPITAL_COMMUNITY): Payer: Self-pay

## 2023-07-04 ENCOUNTER — Other Ambulatory Visit (HOSPITAL_BASED_OUTPATIENT_CLINIC_OR_DEPARTMENT_OTHER): Payer: Self-pay

## 2023-07-04 ENCOUNTER — Other Ambulatory Visit (HOSPITAL_COMMUNITY): Payer: Self-pay

## 2023-07-06 ENCOUNTER — Other Ambulatory Visit (HOSPITAL_COMMUNITY): Payer: Self-pay

## 2023-07-06 ENCOUNTER — Other Ambulatory Visit (HOSPITAL_BASED_OUTPATIENT_CLINIC_OR_DEPARTMENT_OTHER): Payer: Self-pay

## 2023-07-08 ENCOUNTER — Other Ambulatory Visit (HOSPITAL_COMMUNITY): Payer: Self-pay

## 2023-07-16 ENCOUNTER — Other Ambulatory Visit (HOSPITAL_COMMUNITY): Payer: Self-pay | Admitting: Cardiology

## 2023-07-18 DIAGNOSIS — Z79891 Long term (current) use of opiate analgesic: Secondary | ICD-10-CM | POA: Diagnosis not present

## 2023-07-18 DIAGNOSIS — M79604 Pain in right leg: Secondary | ICD-10-CM | POA: Diagnosis not present

## 2023-07-18 DIAGNOSIS — G894 Chronic pain syndrome: Secondary | ICD-10-CM | POA: Diagnosis not present

## 2023-07-18 DIAGNOSIS — M25562 Pain in left knee: Secondary | ICD-10-CM | POA: Diagnosis not present

## 2023-07-19 ENCOUNTER — Other Ambulatory Visit (HOSPITAL_COMMUNITY): Payer: Self-pay | Admitting: Nurse Practitioner

## 2023-07-19 DIAGNOSIS — M25561 Pain in right knee: Secondary | ICD-10-CM

## 2023-08-02 ENCOUNTER — Telehealth (HOSPITAL_COMMUNITY): Payer: Self-pay | Admitting: Cardiology

## 2023-08-07 ENCOUNTER — Other Ambulatory Visit (HOSPITAL_COMMUNITY): Payer: Self-pay | Admitting: Cardiology

## 2023-08-15 ENCOUNTER — Other Ambulatory Visit: Payer: Self-pay | Admitting: Family Medicine

## 2023-08-22 ENCOUNTER — Other Ambulatory Visit: Payer: Self-pay | Admitting: Physician Assistant

## 2023-08-22 DIAGNOSIS — I5042 Chronic combined systolic (congestive) and diastolic (congestive) heart failure: Secondary | ICD-10-CM

## 2023-09-04 ENCOUNTER — Other Ambulatory Visit (HOSPITAL_COMMUNITY): Payer: Self-pay

## 2023-09-06 ENCOUNTER — Other Ambulatory Visit (HOSPITAL_COMMUNITY): Payer: Self-pay

## 2023-09-06 ENCOUNTER — Other Ambulatory Visit: Payer: Self-pay

## 2023-09-21 ENCOUNTER — Other Ambulatory Visit (HOSPITAL_COMMUNITY): Payer: Self-pay | Admitting: Cardiology

## 2023-09-24 ENCOUNTER — Other Ambulatory Visit (HOSPITAL_COMMUNITY): Payer: Self-pay | Admitting: Cardiology

## 2023-09-28 ENCOUNTER — Other Ambulatory Visit (HOSPITAL_COMMUNITY): Payer: Self-pay | Admitting: Cardiology

## 2023-10-07 ENCOUNTER — Other Ambulatory Visit (HOSPITAL_COMMUNITY): Payer: Self-pay | Admitting: Cardiology

## 2023-11-07 ENCOUNTER — Ambulatory Visit: Admitting: Podiatry

## 2023-11-08 ENCOUNTER — Encounter

## 2023-11-12 ENCOUNTER — Other Ambulatory Visit (HOSPITAL_COMMUNITY): Payer: Self-pay | Admitting: Cardiology

## 2023-11-13 ENCOUNTER — Telehealth (HOSPITAL_COMMUNITY): Payer: Self-pay

## 2023-11-13 NOTE — Telephone Encounter (Signed)
 Patient needs Pharmacy appointment rescheduled for more Digoxin  refills

## 2023-11-27 NOTE — Progress Notes (Signed)
   11/27/2023  Patient ID: Stacey Fletcher, female   DOB: [birth year], 73 y.o.   MRN: [id number]  This patient is appearing on a report for being at risk of failing the adherence measure for cholesterol (statin) medications this calendar year.   Medication: pravastatin  20 mg tablets Last filled:  6/27 for a 90 day supply  MyChart message sent to patient  [name] C. Jackelyne Sayer [employer] [facility] PharmD Candidate Class of 2026

## 2023-11-28 ENCOUNTER — Telehealth (HOSPITAL_COMMUNITY): Payer: Self-pay | Admitting: Cardiology

## 2023-12-05 ENCOUNTER — Telehealth (HOSPITAL_COMMUNITY): Payer: Self-pay | Admitting: Cardiology

## 2023-12-05 NOTE — Telephone Encounter (Signed)
 Called to confirm/remind patient of their appointment at the [facility] on 12/05/23, lvm on son cell.   Appointment:   [] Confirmed  [x] Left mess   [] No answer/No voice mail  [] VM Full/unable to leave message  [] Phone not in service  Patient reminded to bring all medications and/or complete list.  Confirmed patient has transportation. Gave directions, instructed to utilize valet parking.

## 2023-12-06 ENCOUNTER — Ambulatory Visit (HOSPITAL_COMMUNITY)
Admission: RE | Admit: 2023-12-06 | Discharge: 2023-12-06 | Disposition: A | Discharge status: Home / Self Care | Source: Ambulatory Visit | Attending: Cardiology | Admitting: Cardiology

## 2023-12-06 VITALS — BP 122/74 | HR 96 | Wt 190.2 lb

## 2023-12-06 DIAGNOSIS — I110 Hypertensive heart disease with heart failure: Secondary | ICD-10-CM | POA: Diagnosis not present

## 2023-12-06 DIAGNOSIS — I5042 Chronic combined systolic (congestive) and diastolic (congestive) heart failure: Secondary | ICD-10-CM

## 2023-12-06 DIAGNOSIS — I5022 Chronic systolic (congestive) heart failure: Secondary | ICD-10-CM | POA: Diagnosis present

## 2023-12-06 DIAGNOSIS — F4310 Post-traumatic stress disorder, unspecified: Secondary | ICD-10-CM | POA: Diagnosis not present

## 2023-12-06 DIAGNOSIS — Z7984 Long term (current) use of oral hypoglycemic drugs: Secondary | ICD-10-CM | POA: Insufficient documentation

## 2023-12-06 DIAGNOSIS — E119 Type 2 diabetes mellitus without complications: Secondary | ICD-10-CM | POA: Diagnosis not present

## 2023-12-06 DIAGNOSIS — M1711 Unilateral primary osteoarthritis, right knee: Secondary | ICD-10-CM | POA: Diagnosis not present

## 2023-12-06 DIAGNOSIS — Z79899 Other long term (current) drug therapy: Secondary | ICD-10-CM | POA: Diagnosis not present

## 2023-12-06 DIAGNOSIS — I340 Nonrheumatic mitral (valve) insufficiency: Secondary | ICD-10-CM | POA: Insufficient documentation

## 2023-12-06 NOTE — Patient Instructions (Signed)
 Medication Changes:  STOP DIGOXIN    Follow-Up in: 6 MONTHS WITH DR. ZENAIDA PLEASE CALL OUR OFFICE AROUND MARCH 2026 TO GET SCHEDULED FOR YOUR APPOINTMENT. PHONE NUMBER IS [phone] OPTION 2   At the [facility], you and your health needs are our priority. We have a designated team specialized in the treatment of Heart Failure. This Care Team includes your primary Heart Failure Specialized Cardiologist (physician), Advanced Practice Providers (APPs- Physician Assistants and Nurse Practitioners), and Pharmacist who all work together to provide you with the care you need, when you need it.   You may see any of the following providers on your designated Care Team at your next follow up:  Dr. Toribio Fuel Dr. Ezra Shuck Dr. Odis Zenaida Greig Mosses, NP Caffie Shed, [place] [facility] [place], [place] Beckey Coe, NP Jordan Lee, NP Tinnie Redman, PharmD   Please be sure to bring in all your medications bottles to every appointment.   Need to Contact Us :  If you have any questions or concerns before your next appointment please send us  a message through [facility] or call our office at [phone].    TO LEAVE A MESSAGE FOR THE NURSE SELECT OPTION 2, PLEASE LEAVE A MESSAGE INCLUDING: YOUR NAME DATE OF BIRTH CALL BACK NUMBER REASON FOR CALL**this is important as we prioritize the call backs  YOU WILL RECEIVE A CALL BACK THE SAME DAY AS LONG AS YOU CALL BEFORE 4:00 PM

## 2023-12-06 NOTE — Progress Notes (Signed)
   ADVANCED [facility] FAILURE FOLLOW UP CLINIC NOTE  Referring Physician: No ref. provider found  Primary Care: Pcp, No Primary Cardiologist:  HPI: Stacey Fletcher is a 73 y.o. female who presents for follow up of chronic systolic heart failure.      HTN, T2DM, PTSD from son's death and diagnosis of HFrEF made last summer. In June of 2023, she reports waking up in her sleep with severe respiratory distress after a few weeks of worsening DOE. She was admitted to [facility] where she was found to have an LVEF of 30-35%; she was diuresed with IV lasix  and discharged on low dose GDMT. She had a subsequent TTE in September 2023 w/ worsening LV function (LVEF 20-25%) and moderate to severe MR. Since that time she has also undergone TEE confirming presence of moderate to severe MR for which she has seen structural cardiology. Otherwise, Stacey Fletcher attempts to be as active as possible. She feels very limited by right knee osteoarthritis for which she is seeing orthopedic surgery. She is originally from [place] but moved to [place] after the murder of her son. She has excellent support here from her 2nd son.      SUBJECTIVE:  Patient reports that she has been doing fairly well since her last visit. She denies any issues with her medications and has been walking with minimal symptoms. She did not make her EP appointment and would like to defer ICD at this time.   PMH, current medications, allergies, social history, and family history reviewed in epic.  PHYSICAL EXAM: Vitals:   12/06/23 1404  BP: 122/74  Pulse: 96  SpO2: 97%   GENERAL: Well nourished and in no apparent distress at rest.  PULM:  Normal work of breathing, clear to auscultation bilaterally. Respirations are unlabored.  CARDIAC:  JVP: flat         Normal rate with regular rhythm. No murmurs, rubs or gallops.  No edema. Warm and well perfused extremities. ABDOMEN: Soft, non-tender, non-distended. NEUROLOGIC: Patient is oriented x3  with no focal or lateralizing neurologic deficits.    DATA REVIEW  01/09/22: NSR    ECHO: 02/20/21: LVEF 20%, moderate MR 10/24/21: LVEF 20-25%, mod-severe MR 07/20/21: LVEF 30-35%, Grade II DD, mild MR 02/20/22: LVEF 30%, grade 1 diastolic dysfunction, moderate mitral regurgitation. 04/2023: LVEF 30/35%, normal RV  CATH: 07/2021: Elevated PCPW of 36, CO/CI of 5.39/2.46, normal coronary arteries   CMR: 12/2021: Severely dilated LV, LVEF 14%, thin LGE in the septal mid wall, normal RV   ASSESSMENT & PLAN:  Chronic systolic heart failure: Significant improvement with medical therapy, NYHA class II and stable at this time. Discussed ICD referral, holding off at this time. - Stop digoxin  given improved symptoms - Continue spironolactone  25mg  daily - Continue farxiga  10mg  daily - Continue metoprolol  succinate 100mg  daily - Continue losartan  150mg  daily - Has completed cardiac rehab - Euvolemic, taking lasix  40mg  daily - CPX without significant limitations - Allergy to ARNI/ACEi  Moderate MR: Improved to trivial on most recent echo.  - CTM  HTN: Well controlled now.    Follow up in 6 months  Morene Brownie, MD Advanced Heart Failure Mechanical Circulatory Support 12/06/23

## 2023-12-09 ENCOUNTER — Encounter: Payer: Self-pay | Admitting: Radiology

## 2024-03-12 ENCOUNTER — Other Ambulatory Visit (HOSPITAL_COMMUNITY): Payer: Self-pay

## 2024-03-12 DIAGNOSIS — I5042 Chronic combined systolic (congestive) and diastolic (congestive) heart failure: Secondary | ICD-10-CM

## 2024-03-12 MED ORDER — POTASSIUM CHLORIDE ER 20 MEQ PO TBCR: 1.0000 | EXTENDED_RELEASE_TABLET | ORAL | 3 refills | Status: AC

## 2024-03-12 MED ORDER — SPIRONOLACTONE 25 MG PO TABS: 25.0000 mg | ORAL_TABLET | Freq: Every day | ORAL | 3 refills | Status: AC
